# Patient Record
Sex: Female | Born: 1970 | Race: White | Hispanic: No | Smoking: Former smoker
Health system: Southern US, Community
[De-identification: ages and names within clinical notes are randomized; demographics above are authoritative.]

## PROBLEM LIST (undated history)

## (undated) ENCOUNTER — Inpatient Hospital Stay (HOSPITAL_COMMUNITY): Payer: Self-pay

## (undated) DIAGNOSIS — I471 Supraventricular tachycardia, unspecified: Secondary | ICD-10-CM

## (undated) DIAGNOSIS — F419 Anxiety disorder, unspecified: Secondary | ICD-10-CM

## (undated) DIAGNOSIS — F329 Major depressive disorder, single episode, unspecified: Secondary | ICD-10-CM

## (undated) DIAGNOSIS — R87619 Unspecified abnormal cytological findings in specimens from cervix uteri: Secondary | ICD-10-CM

## (undated) DIAGNOSIS — T8859XA Other complications of anesthesia, initial encounter: Secondary | ICD-10-CM

## (undated) DIAGNOSIS — O139 Gestational [pregnancy-induced] hypertension without significant proteinuria, unspecified trimester: Secondary | ICD-10-CM

## (undated) DIAGNOSIS — F988 Other specified behavioral and emotional disorders with onset usually occurring in childhood and adolescence: Secondary | ICD-10-CM

## (undated) DIAGNOSIS — F32A Depression, unspecified: Secondary | ICD-10-CM

## (undated) DIAGNOSIS — M1990 Unspecified osteoarthritis, unspecified site: Secondary | ICD-10-CM

## (undated) DIAGNOSIS — R51 Headache: Secondary | ICD-10-CM

## (undated) DIAGNOSIS — R112 Nausea with vomiting, unspecified: Secondary | ICD-10-CM

## (undated) DIAGNOSIS — K219 Gastro-esophageal reflux disease without esophagitis: Secondary | ICD-10-CM

## (undated) DIAGNOSIS — G709 Myoneural disorder, unspecified: Secondary | ICD-10-CM

## (undated) DIAGNOSIS — I492 Junctional premature depolarization: Secondary | ICD-10-CM

## (undated) DIAGNOSIS — M797 Fibromyalgia: Secondary | ICD-10-CM

## (undated) DIAGNOSIS — G4730 Sleep apnea, unspecified: Secondary | ICD-10-CM

## (undated) DIAGNOSIS — IMO0002 Reserved for concepts with insufficient information to code with codable children: Secondary | ICD-10-CM

## (undated) DIAGNOSIS — I4949 Other premature depolarization: Secondary | ICD-10-CM

## (undated) DIAGNOSIS — Z9889 Other specified postprocedural states: Secondary | ICD-10-CM

## (undated) HISTORY — PX: FOOT SURGERY: SHX648

## (undated) HISTORY — PX: CERVICAL FUSION: SHX112

## (undated) HISTORY — DX: Headache: R51

## (undated) HISTORY — PX: SHOULDER SURGERY: SHX246

## (undated) HISTORY — DX: Other specified behavioral and emotional disorders with onset usually occurring in childhood and adolescence: F98.8

---
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
## 1998-01-21 ENCOUNTER — Ambulatory Visit (HOSPITAL_COMMUNITY): Admission: RE | Admit: 1998-01-21 | Discharge: 1998-01-21 | Payer: Self-pay | Admitting: Family Medicine

## 1998-03-01 ENCOUNTER — Emergency Department (HOSPITAL_COMMUNITY): Admission: EM | Admit: 1998-03-01 | Discharge: 1998-03-01 | Payer: Self-pay | Admitting: Emergency Medicine

## 1998-05-05 ENCOUNTER — Emergency Department (HOSPITAL_COMMUNITY): Admission: EM | Admit: 1998-05-05 | Discharge: 1998-05-05 | Payer: Self-pay | Admitting: Emergency Medicine

## 1998-05-17 ENCOUNTER — Emergency Department (HOSPITAL_COMMUNITY): Admission: EM | Admit: 1998-05-17 | Discharge: 1998-05-17 | Payer: Self-pay | Admitting: Emergency Medicine

## 1998-05-28 ENCOUNTER — Emergency Department (HOSPITAL_COMMUNITY): Admission: EM | Admit: 1998-05-28 | Discharge: 1998-05-28 | Payer: Self-pay | Admitting: Emergency Medicine

## 1998-07-24 HISTORY — PX: CARPAL TUNNEL RELEASE: SHX101

## 1998-07-26 ENCOUNTER — Emergency Department (HOSPITAL_COMMUNITY): Admission: EM | Admit: 1998-07-26 | Discharge: 1998-07-26 | Payer: Self-pay | Admitting: Internal Medicine

## 1998-10-01 ENCOUNTER — Encounter: Payer: Self-pay | Admitting: Neurosurgery

## 1998-10-01 ENCOUNTER — Ambulatory Visit (HOSPITAL_COMMUNITY): Admission: RE | Admit: 1998-10-01 | Discharge: 1998-10-01 | Payer: Self-pay | Admitting: Neurosurgery

## 1998-10-11 ENCOUNTER — Ambulatory Visit (HOSPITAL_COMMUNITY): Admission: RE | Admit: 1998-10-11 | Discharge: 1998-10-11 | Payer: Self-pay | Admitting: Neurosurgery

## 1998-11-04 ENCOUNTER — Ambulatory Visit (HOSPITAL_COMMUNITY): Admission: RE | Admit: 1998-11-04 | Discharge: 1998-11-04 | Payer: Self-pay | Admitting: Neurosurgery

## 1999-05-17 ENCOUNTER — Encounter: Admission: RE | Admit: 1999-05-17 | Discharge: 1999-07-11 | Payer: Self-pay | Admitting: Family Medicine

## 1999-08-22 ENCOUNTER — Encounter: Payer: Self-pay | Admitting: Neurosurgery

## 1999-08-24 ENCOUNTER — Inpatient Hospital Stay (HOSPITAL_COMMUNITY): Admission: RE | Admit: 1999-08-24 | Discharge: 1999-08-25 | Payer: Self-pay | Admitting: Neurosurgery

## 1999-08-24 ENCOUNTER — Encounter: Payer: Self-pay | Admitting: Neurosurgery

## 1999-08-26 ENCOUNTER — Emergency Department (HOSPITAL_COMMUNITY): Admission: EM | Admit: 1999-08-26 | Discharge: 1999-08-26 | Payer: Self-pay | Admitting: Emergency Medicine

## 1999-09-16 ENCOUNTER — Encounter: Admission: RE | Admit: 1999-09-16 | Discharge: 1999-09-16 | Payer: Self-pay | Admitting: Neurosurgery

## 1999-09-16 ENCOUNTER — Encounter: Payer: Self-pay | Admitting: Neurosurgery

## 1999-11-04 ENCOUNTER — Emergency Department (HOSPITAL_COMMUNITY): Admission: EM | Admit: 1999-11-04 | Discharge: 1999-11-04 | Payer: Self-pay | Admitting: Emergency Medicine

## 1999-11-30 ENCOUNTER — Other Ambulatory Visit: Admission: RE | Admit: 1999-11-30 | Discharge: 1999-11-30 | Payer: Self-pay | Admitting: Family Medicine

## 1999-12-27 ENCOUNTER — Encounter: Admission: RE | Admit: 1999-12-27 | Discharge: 1999-12-27 | Payer: Self-pay | Admitting: Neurosurgery

## 1999-12-27 ENCOUNTER — Encounter: Payer: Self-pay | Admitting: Neurosurgery

## 2000-02-20 ENCOUNTER — Encounter: Payer: Self-pay | Admitting: Neurosurgery

## 2000-02-20 ENCOUNTER — Encounter: Admission: RE | Admit: 2000-02-20 | Discharge: 2000-02-20 | Payer: Self-pay | Admitting: Neurosurgery

## 2000-04-17 ENCOUNTER — Encounter: Payer: Self-pay | Admitting: Neurosurgery

## 2000-04-17 ENCOUNTER — Ambulatory Visit (HOSPITAL_COMMUNITY): Admission: RE | Admit: 2000-04-17 | Discharge: 2000-04-17 | Payer: Self-pay | Admitting: Neurosurgery

## 2000-04-25 ENCOUNTER — Encounter: Payer: Self-pay | Admitting: Neurosurgery

## 2000-04-26 ENCOUNTER — Encounter: Payer: Self-pay | Admitting: Neurosurgery

## 2000-04-26 ENCOUNTER — Ambulatory Visit (HOSPITAL_COMMUNITY): Admission: RE | Admit: 2000-04-26 | Discharge: 2000-04-27 | Payer: Self-pay | Admitting: Neurosurgery

## 2000-04-27 ENCOUNTER — Encounter: Payer: Self-pay | Admitting: Neurosurgery

## 2000-05-21 ENCOUNTER — Encounter: Payer: Self-pay | Admitting: Neurosurgery

## 2000-05-21 ENCOUNTER — Ambulatory Visit (HOSPITAL_COMMUNITY): Admission: RE | Admit: 2000-05-21 | Discharge: 2000-05-21 | Payer: Self-pay | Admitting: Neurosurgery

## 2000-07-24 HISTORY — PX: ABLATION OF DYSRHYTHMIC FOCUS: SHX254

## 2000-09-07 ENCOUNTER — Encounter: Payer: Self-pay | Admitting: Neurosurgery

## 2000-09-07 ENCOUNTER — Ambulatory Visit (HOSPITAL_COMMUNITY): Admission: RE | Admit: 2000-09-07 | Discharge: 2000-09-07 | Payer: Self-pay | Admitting: Neurosurgery

## 2000-09-28 ENCOUNTER — Inpatient Hospital Stay (HOSPITAL_COMMUNITY): Admission: EM | Admit: 2000-09-28 | Discharge: 2000-09-29 | Payer: Self-pay | Admitting: Emergency Medicine

## 2000-09-28 ENCOUNTER — Encounter: Payer: Self-pay | Admitting: Emergency Medicine

## 2000-09-29 ENCOUNTER — Emergency Department (HOSPITAL_COMMUNITY): Admission: EM | Admit: 2000-09-29 | Discharge: 2000-09-29 | Payer: Self-pay | Admitting: Emergency Medicine

## 2000-11-08 ENCOUNTER — Ambulatory Visit (HOSPITAL_COMMUNITY): Admission: RE | Admit: 2000-11-08 | Discharge: 2000-11-09 | Payer: Self-pay | Admitting: Internal Medicine

## 2000-12-11 ENCOUNTER — Emergency Department (HOSPITAL_COMMUNITY): Admission: EM | Admit: 2000-12-11 | Discharge: 2000-12-11 | Payer: Self-pay | Admitting: Emergency Medicine

## 2001-03-14 ENCOUNTER — Ambulatory Visit (HOSPITAL_COMMUNITY): Admission: RE | Admit: 2001-03-14 | Discharge: 2001-03-14 | Payer: Self-pay | Admitting: Gastroenterology

## 2001-03-14 ENCOUNTER — Encounter (INDEPENDENT_AMBULATORY_CARE_PROVIDER_SITE_OTHER): Payer: Self-pay | Admitting: Specialist

## 2001-04-10 ENCOUNTER — Encounter: Payer: Self-pay | Admitting: Orthopaedic Surgery

## 2001-04-10 ENCOUNTER — Ambulatory Visit (HOSPITAL_COMMUNITY): Admission: RE | Admit: 2001-04-10 | Discharge: 2001-04-10 | Payer: Self-pay | Admitting: Orthopaedic Surgery

## 2001-05-09 ENCOUNTER — Inpatient Hospital Stay (HOSPITAL_COMMUNITY): Admission: RE | Admit: 2001-05-09 | Discharge: 2001-05-13 | Payer: Self-pay | Admitting: Orthopaedic Surgery

## 2001-05-09 ENCOUNTER — Encounter: Payer: Self-pay | Admitting: Orthopaedic Surgery

## 2001-05-11 ENCOUNTER — Encounter: Payer: Self-pay | Admitting: Orthopaedic Surgery

## 2001-06-13 ENCOUNTER — Ambulatory Visit (HOSPITAL_COMMUNITY): Admission: RE | Admit: 2001-06-13 | Discharge: 2001-06-13 | Payer: Self-pay | Admitting: *Deleted

## 2001-06-14 ENCOUNTER — Other Ambulatory Visit: Admission: RE | Admit: 2001-06-14 | Discharge: 2001-06-14 | Payer: Self-pay | Admitting: Family Medicine

## 2001-08-01 ENCOUNTER — Encounter: Payer: Self-pay | Admitting: Orthopaedic Surgery

## 2001-08-01 ENCOUNTER — Encounter: Admission: RE | Admit: 2001-08-01 | Discharge: 2001-08-01 | Payer: Self-pay | Admitting: Orthopaedic Surgery

## 2002-01-01 ENCOUNTER — Other Ambulatory Visit: Admission: RE | Admit: 2002-01-01 | Discharge: 2002-01-01 | Payer: Self-pay | Admitting: Obstetrics and Gynecology

## 2002-03-02 ENCOUNTER — Encounter: Payer: Self-pay | Admitting: Emergency Medicine

## 2002-03-02 ENCOUNTER — Emergency Department (HOSPITAL_COMMUNITY): Admission: EM | Admit: 2002-03-02 | Discharge: 2002-03-02 | Payer: Self-pay | Admitting: Emergency Medicine

## 2002-04-22 ENCOUNTER — Encounter: Admission: RE | Admit: 2002-04-22 | Discharge: 2002-04-22 | Payer: Self-pay | Admitting: Anesthesiology

## 2002-05-21 ENCOUNTER — Encounter: Admission: RE | Admit: 2002-05-21 | Discharge: 2002-08-19 | Payer: Self-pay

## 2003-03-09 ENCOUNTER — Other Ambulatory Visit: Admission: RE | Admit: 2003-03-09 | Discharge: 2003-03-09 | Payer: Self-pay | Admitting: Family Medicine

## 2004-02-24 ENCOUNTER — Encounter
Admission: RE | Admit: 2004-02-24 | Discharge: 2004-02-24 | Payer: Self-pay | Admitting: Physical Medicine and Rehabilitation

## 2004-03-07 ENCOUNTER — Emergency Department (HOSPITAL_COMMUNITY): Admission: EM | Admit: 2004-03-07 | Discharge: 2004-03-07 | Payer: Self-pay | Admitting: Emergency Medicine

## 2004-03-22 ENCOUNTER — Other Ambulatory Visit: Admission: RE | Admit: 2004-03-22 | Discharge: 2004-03-22 | Payer: Self-pay | Admitting: Family Medicine

## 2004-06-14 ENCOUNTER — Ambulatory Visit: Payer: Self-pay | Admitting: Family Medicine

## 2004-08-29 ENCOUNTER — Ambulatory Visit: Payer: Self-pay | Admitting: Family Medicine

## 2004-12-06 ENCOUNTER — Ambulatory Visit: Payer: Self-pay | Admitting: Family Medicine

## 2005-02-20 ENCOUNTER — Ambulatory Visit: Payer: Self-pay | Admitting: Family Medicine

## 2005-02-23 ENCOUNTER — Ambulatory Visit: Payer: Self-pay | Admitting: Family Medicine

## 2005-06-01 ENCOUNTER — Ambulatory Visit: Payer: Self-pay | Admitting: Family Medicine

## 2005-09-20 ENCOUNTER — Ambulatory Visit: Payer: Self-pay | Admitting: Family Medicine

## 2005-10-02 ENCOUNTER — Ambulatory Visit: Payer: Self-pay | Admitting: Family Medicine

## 2005-12-05 ENCOUNTER — Ambulatory Visit: Payer: Self-pay | Admitting: Family Medicine

## 2006-03-01 ENCOUNTER — Ambulatory Visit: Payer: Self-pay | Admitting: Family Medicine

## 2006-03-06 ENCOUNTER — Ambulatory Visit: Payer: Self-pay | Admitting: Family Medicine

## 2006-08-30 ENCOUNTER — Ambulatory Visit: Payer: Self-pay | Admitting: Family Medicine

## 2007-01-31 ENCOUNTER — Ambulatory Visit: Payer: Self-pay | Admitting: Family Medicine

## 2007-01-31 DIAGNOSIS — F988 Other specified behavioral and emotional disorders with onset usually occurring in childhood and adolescence: Secondary | ICD-10-CM | POA: Insufficient documentation

## 2007-01-31 DIAGNOSIS — Z87891 Personal history of nicotine dependence: Secondary | ICD-10-CM | POA: Insufficient documentation

## 2007-01-31 DIAGNOSIS — R51 Headache: Secondary | ICD-10-CM

## 2007-01-31 DIAGNOSIS — R519 Headache, unspecified: Secondary | ICD-10-CM | POA: Insufficient documentation

## 2007-03-05 ENCOUNTER — Encounter: Payer: Self-pay | Admitting: Family Medicine

## 2007-03-05 ENCOUNTER — Other Ambulatory Visit: Admission: RE | Admit: 2007-03-05 | Discharge: 2007-03-05 | Payer: Self-pay | Admitting: Family Medicine

## 2007-03-05 ENCOUNTER — Ambulatory Visit: Payer: Self-pay | Admitting: Family Medicine

## 2007-03-05 DIAGNOSIS — Z9889 Other specified postprocedural states: Secondary | ICD-10-CM | POA: Insufficient documentation

## 2007-03-05 DIAGNOSIS — M797 Fibromyalgia: Secondary | ICD-10-CM | POA: Insufficient documentation

## 2007-03-06 LAB — CONVERTED CEMR LAB
ALT: 21 units/L (ref 0–35)
BUN: 8 mg/dL (ref 6–23)
Basophils Absolute: 0.1 10*3/uL (ref 0.0–0.1)
Basophils Relative: 0.7 % (ref 0.0–1.0)
Calcium: 8.7 mg/dL (ref 8.4–10.5)
Cholesterol: 158 mg/dL (ref 0–200)
Creatinine, Ser: 0.7 mg/dL (ref 0.4–1.2)
Eosinophils Absolute: 0.1 10*3/uL (ref 0.0–0.6)
GFR calc Af Amer: 122 mL/min
GFR calc non Af Amer: 101 mL/min
Glucose, Bld: 91 mg/dL (ref 70–99)
Monocytes Relative: 7.6 % (ref 3.0–11.0)
Neutro Abs: 4.6 10*3/uL (ref 1.4–7.7)
Potassium: 3.8 meq/L (ref 3.5–5.1)
RBC: 4.52 M/uL (ref 3.87–5.11)
RDW: 12.3 % (ref 11.5–14.6)
TSH: 1.29 microintl units/mL (ref 0.35–5.50)
Total Bilirubin: 0.7 mg/dL (ref 0.3–1.2)
Total Protein: 6.1 g/dL (ref 6.0–8.3)
Triglycerides: 52 mg/dL (ref 0–149)
VLDL: 10 mg/dL (ref 0–40)

## 2007-03-11 ENCOUNTER — Telehealth (INDEPENDENT_AMBULATORY_CARE_PROVIDER_SITE_OTHER): Payer: Self-pay | Admitting: *Deleted

## 2007-03-20 ENCOUNTER — Encounter (INDEPENDENT_AMBULATORY_CARE_PROVIDER_SITE_OTHER): Payer: Self-pay | Admitting: *Deleted

## 2007-03-28 ENCOUNTER — Emergency Department (HOSPITAL_COMMUNITY): Admission: EM | Admit: 2007-03-28 | Discharge: 2007-03-28 | Payer: Self-pay | Admitting: Emergency Medicine

## 2007-06-11 ENCOUNTER — Ambulatory Visit: Payer: Self-pay | Admitting: Family Medicine

## 2007-10-25 ENCOUNTER — Ambulatory Visit: Payer: Self-pay | Admitting: Family Medicine

## 2007-11-11 ENCOUNTER — Telehealth (INDEPENDENT_AMBULATORY_CARE_PROVIDER_SITE_OTHER): Payer: Self-pay | Admitting: *Deleted

## 2007-11-22 ENCOUNTER — Telehealth (INDEPENDENT_AMBULATORY_CARE_PROVIDER_SITE_OTHER): Payer: Self-pay | Admitting: *Deleted

## 2007-12-13 ENCOUNTER — Encounter: Payer: Self-pay | Admitting: Internal Medicine

## 2007-12-20 ENCOUNTER — Telehealth (INDEPENDENT_AMBULATORY_CARE_PROVIDER_SITE_OTHER): Payer: Self-pay | Admitting: *Deleted

## 2008-01-03 ENCOUNTER — Telehealth (INDEPENDENT_AMBULATORY_CARE_PROVIDER_SITE_OTHER): Payer: Self-pay | Admitting: *Deleted

## 2008-02-03 ENCOUNTER — Telehealth (INDEPENDENT_AMBULATORY_CARE_PROVIDER_SITE_OTHER): Payer: Self-pay | Admitting: *Deleted

## 2008-02-19 ENCOUNTER — Ambulatory Visit: Payer: Self-pay | Admitting: Family Medicine

## 2008-04-10 ENCOUNTER — Other Ambulatory Visit: Admission: RE | Admit: 2008-04-10 | Discharge: 2008-04-10 | Payer: Self-pay | Admitting: Family Medicine

## 2008-04-10 ENCOUNTER — Encounter: Payer: Self-pay | Admitting: Family Medicine

## 2008-04-10 ENCOUNTER — Ambulatory Visit: Payer: Self-pay | Admitting: Family Medicine

## 2008-04-21 ENCOUNTER — Encounter (INDEPENDENT_AMBULATORY_CARE_PROVIDER_SITE_OTHER): Payer: Self-pay | Admitting: *Deleted

## 2008-04-21 LAB — CONVERTED CEMR LAB
Albumin: 4.4 g/dL (ref 3.5–5.2)
BUN: 8 mg/dL (ref 6–23)
Basophils Relative: 1 % (ref 0–1)
CO2: 23 meq/L (ref 19–32)
Chloride: 104 meq/L (ref 96–112)
Creatinine, Ser: 0.69 mg/dL (ref 0.40–1.20)
HCT: 42.9 % (ref 36.0–46.0)
HDL: 82 mg/dL (ref >39)
Hemoglobin: 14.5 g/dL (ref 12.0–15.0)
Lymphs Abs: 2.2 10*3/uL (ref 0.7–4.0)
MCHC: 33.8 g/dL (ref 30.0–36.0)
MCV: 91.3 fL (ref 78.0–100.0)
Neutrophils Relative %: 61 % (ref 43–77)
RDW: 13 % (ref 11.5–15.5)
TSH: 1.675 microintl units/mL (ref 0.350–4.50)
Total Protein: 7 g/dL (ref 6.0–8.3)
Triglycerides: 56 mg/dL (ref <150)
VLDL: 11 mg/dL (ref 0–40)
WBC: 8 10*3/uL (ref 4.0–10.5)

## 2008-04-22 ENCOUNTER — Encounter (INDEPENDENT_AMBULATORY_CARE_PROVIDER_SITE_OTHER): Payer: Self-pay | Admitting: *Deleted

## 2008-06-03 ENCOUNTER — Telehealth (INDEPENDENT_AMBULATORY_CARE_PROVIDER_SITE_OTHER): Payer: Self-pay | Admitting: *Deleted

## 2008-07-30 ENCOUNTER — Telehealth (INDEPENDENT_AMBULATORY_CARE_PROVIDER_SITE_OTHER): Payer: Self-pay | Admitting: *Deleted

## 2008-08-26 ENCOUNTER — Ambulatory Visit: Payer: Self-pay | Admitting: Family Medicine

## 2008-08-26 DIAGNOSIS — T7840XA Allergy, unspecified, initial encounter: Secondary | ICD-10-CM | POA: Insufficient documentation

## 2008-08-27 ENCOUNTER — Telehealth: Payer: Self-pay | Admitting: Family Medicine

## 2008-10-20 ENCOUNTER — Telehealth (INDEPENDENT_AMBULATORY_CARE_PROVIDER_SITE_OTHER): Payer: Self-pay | Admitting: *Deleted

## 2009-01-01 ENCOUNTER — Telehealth (INDEPENDENT_AMBULATORY_CARE_PROVIDER_SITE_OTHER): Payer: Self-pay | Admitting: *Deleted

## 2009-01-07 ENCOUNTER — Telehealth (INDEPENDENT_AMBULATORY_CARE_PROVIDER_SITE_OTHER): Payer: Self-pay | Admitting: *Deleted

## 2009-01-07 ENCOUNTER — Encounter: Payer: Self-pay | Admitting: Family Medicine

## 2009-02-15 ENCOUNTER — Telehealth (INDEPENDENT_AMBULATORY_CARE_PROVIDER_SITE_OTHER): Payer: Self-pay | Admitting: *Deleted

## 2009-03-26 ENCOUNTER — Ambulatory Visit: Payer: Self-pay | Admitting: Family Medicine

## 2009-03-26 DIAGNOSIS — F329 Major depressive disorder, single episode, unspecified: Secondary | ICD-10-CM

## 2009-03-26 DIAGNOSIS — F411 Generalized anxiety disorder: Secondary | ICD-10-CM | POA: Insufficient documentation

## 2009-03-26 DIAGNOSIS — F341 Dysthymic disorder: Secondary | ICD-10-CM

## 2009-03-26 DIAGNOSIS — F3342 Major depressive disorder, recurrent, in full remission: Secondary | ICD-10-CM | POA: Insufficient documentation

## 2009-04-26 ENCOUNTER — Ambulatory Visit: Payer: Self-pay | Admitting: Family Medicine

## 2009-04-26 DIAGNOSIS — N912 Amenorrhea, unspecified: Secondary | ICD-10-CM | POA: Insufficient documentation

## 2009-04-26 LAB — CONVERTED CEMR LAB: Beta hcg, urine, semiquantitative: POSITIVE

## 2009-11-18 ENCOUNTER — Inpatient Hospital Stay (HOSPITAL_COMMUNITY): Admission: AD | Admit: 2009-11-18 | Discharge: 2009-11-18 | Payer: Self-pay | Admitting: Obstetrics and Gynecology

## 2009-11-25 ENCOUNTER — Inpatient Hospital Stay (HOSPITAL_COMMUNITY): Admission: AD | Admit: 2009-11-25 | Discharge: 2009-11-25 | Payer: Self-pay | Admitting: Obstetrics and Gynecology

## 2009-11-29 ENCOUNTER — Inpatient Hospital Stay (HOSPITAL_COMMUNITY): Admission: AD | Admit: 2009-11-29 | Discharge: 2009-11-29 | Payer: Self-pay | Admitting: Obstetrics and Gynecology

## 2009-12-07 ENCOUNTER — Ambulatory Visit: Payer: Self-pay | Admitting: Obstetrics and Gynecology

## 2009-12-07 ENCOUNTER — Inpatient Hospital Stay (HOSPITAL_COMMUNITY): Admission: AD | Admit: 2009-12-07 | Discharge: 2009-12-09 | Payer: Self-pay | Admitting: Obstetrics and Gynecology

## 2009-12-09 ENCOUNTER — Encounter: Admission: RE | Admit: 2009-12-09 | Discharge: 2010-01-08 | Payer: Self-pay | Admitting: Obstetrics and Gynecology

## 2009-12-13 ENCOUNTER — Ambulatory Visit: Admission: RE | Admit: 2009-12-13 | Discharge: 2009-12-13 | Payer: Self-pay | Admitting: Obstetrics and Gynecology

## 2010-01-09 ENCOUNTER — Encounter: Admission: RE | Admit: 2010-01-09 | Discharge: 2010-01-20 | Payer: Self-pay | Admitting: Obstetrics and Gynecology

## 2010-01-12 ENCOUNTER — Ambulatory Visit: Payer: Self-pay | Admitting: Diagnostic Radiology

## 2010-01-12 ENCOUNTER — Emergency Department (HOSPITAL_BASED_OUTPATIENT_CLINIC_OR_DEPARTMENT_OTHER): Admission: EM | Admit: 2010-01-12 | Discharge: 2010-01-12 | Payer: Self-pay | Admitting: Emergency Medicine

## 2010-02-10 ENCOUNTER — Encounter (INDEPENDENT_AMBULATORY_CARE_PROVIDER_SITE_OTHER): Payer: Self-pay | Admitting: *Deleted

## 2010-02-10 ENCOUNTER — Ambulatory Visit: Payer: Self-pay | Admitting: Family Medicine

## 2010-02-10 ENCOUNTER — Telehealth: Payer: Self-pay | Admitting: Family Medicine

## 2010-02-10 DIAGNOSIS — G43009 Migraine without aura, not intractable, without status migrainosus: Secondary | ICD-10-CM | POA: Insufficient documentation

## 2010-02-10 DIAGNOSIS — R030 Elevated blood-pressure reading, without diagnosis of hypertension: Secondary | ICD-10-CM | POA: Insufficient documentation

## 2010-02-14 LAB — CONVERTED CEMR LAB
ALT: 20 units/L (ref 0–35)
Albumin: 3.9 g/dL (ref 3.5–5.2)
Basophils Relative: 0.7 % (ref 0.0–3.0)
CO2: 30 meq/L (ref 19–32)
Chloride: 109 meq/L (ref 96–112)
Creatinine, Ser: 0.8 mg/dL (ref 0.4–1.2)
Eosinophils Relative: 1.9 % (ref 0.0–5.0)
GFR calc non Af Amer: 91.89 mL/min (ref >60)
Monocytes Absolute: 0.5 10*3/uL (ref 0.1–1.0)
Monocytes Relative: 9.1 % (ref 3.0–12.0)
Neutro Abs: 2.9 10*3/uL (ref 1.4–7.7)
Neutrophils Relative %: 54.5 % (ref 43.0–77.0)
Potassium: 4.4 meq/L (ref 3.5–5.1)
RBC: 4.4 M/uL (ref 3.87–5.11)
Sed Rate: 31 mm/hr — ABNORMAL HIGH (ref 0–22)
Sodium: 145 meq/L (ref 135–145)
Total Protein: 7.4 g/dL (ref 6.0–8.3)
WBC: 5.3 10*3/uL (ref 4.5–10.5)

## 2010-02-24 ENCOUNTER — Encounter (INDEPENDENT_AMBULATORY_CARE_PROVIDER_SITE_OTHER): Payer: Self-pay | Admitting: *Deleted

## 2010-02-24 ENCOUNTER — Telehealth: Payer: Self-pay | Admitting: Family Medicine

## 2010-02-28 ENCOUNTER — Ambulatory Visit: Payer: Self-pay | Admitting: Family Medicine

## 2010-03-01 LAB — CONVERTED CEMR LAB: Sed Rate: 18 mm/hr (ref 0–22)

## 2010-03-02 ENCOUNTER — Telehealth: Payer: Self-pay | Admitting: Internal Medicine

## 2010-03-02 ENCOUNTER — Ambulatory Visit: Payer: Self-pay | Admitting: Family Medicine

## 2010-03-11 ENCOUNTER — Telehealth: Payer: Self-pay | Admitting: Family Medicine

## 2010-03-16 ENCOUNTER — Encounter: Payer: Self-pay | Admitting: Family Medicine

## 2010-04-19 ENCOUNTER — Telehealth: Payer: Self-pay | Admitting: Family Medicine

## 2010-04-21 ENCOUNTER — Ambulatory Visit: Payer: Self-pay | Admitting: Family Medicine

## 2010-05-12 ENCOUNTER — Ambulatory Visit: Payer: Self-pay | Admitting: Family Medicine

## 2010-05-12 DIAGNOSIS — M546 Pain in thoracic spine: Secondary | ICD-10-CM | POA: Insufficient documentation

## 2010-06-29 ENCOUNTER — Ambulatory Visit: Payer: Self-pay | Admitting: Family Medicine

## 2010-06-29 DIAGNOSIS — K219 Gastro-esophageal reflux disease without esophagitis: Secondary | ICD-10-CM | POA: Insufficient documentation

## 2010-06-29 DIAGNOSIS — J019 Acute sinusitis, unspecified: Secondary | ICD-10-CM | POA: Insufficient documentation

## 2010-07-22 ENCOUNTER — Ambulatory Visit
Admission: RE | Admit: 2010-07-22 | Discharge: 2010-07-22 | Payer: Self-pay | Source: Home / Self Care | Attending: Family Medicine | Admitting: Family Medicine

## 2010-08-11 ENCOUNTER — Ambulatory Visit
Admission: RE | Admit: 2010-08-11 | Discharge: 2010-08-11 | Payer: Self-pay | Source: Home / Self Care | Attending: Family Medicine | Admitting: Family Medicine

## 2010-08-11 ENCOUNTER — Other Ambulatory Visit: Payer: Self-pay | Admitting: Family Medicine

## 2010-08-11 DIAGNOSIS — R5383 Other fatigue: Secondary | ICD-10-CM | POA: Insufficient documentation

## 2010-08-11 DIAGNOSIS — L659 Nonscarring hair loss, unspecified: Secondary | ICD-10-CM | POA: Insufficient documentation

## 2010-08-11 DIAGNOSIS — R5381 Other malaise: Secondary | ICD-10-CM | POA: Insufficient documentation

## 2010-08-12 LAB — CBC WITH DIFFERENTIAL/PLATELET
Basophils Absolute: 0 10*3/uL (ref 0.0–0.1)
Basophils Relative: 0.3 % (ref 0.0–3.0)
Eosinophils Absolute: 0.1 10*3/uL (ref 0.0–0.7)
Eosinophils Relative: 1.9 % (ref 0.0–5.0)
HCT: 41.7 % (ref 36.0–46.0)
Hemoglobin: 14.5 g/dL (ref 12.0–15.0)
Lymphocytes Relative: 33.1 % (ref 12.0–46.0)
Lymphs Abs: 1.7 10*3/uL (ref 0.7–4.0)
MCHC: 34.7 g/dL (ref 30.0–36.0)
MCV: 88.4 fl (ref 78.0–100.0)
Monocytes Absolute: 0.5 10*3/uL (ref 0.1–1.0)
Monocytes Relative: 9 % (ref 3.0–12.0)
Neutro Abs: 2.9 10*3/uL (ref 1.4–7.7)
Neutrophils Relative %: 55.7 % (ref 43.0–77.0)
Platelets: 264 10*3/uL (ref 150.0–400.0)
RBC: 4.72 Mil/uL (ref 3.87–5.11)
RDW: 12.9 % (ref 11.5–14.6)
WBC: 5.2 10*3/uL (ref 4.5–10.5)

## 2010-08-12 LAB — BASIC METABOLIC PANEL
BUN: 10 mg/dL (ref 6–23)
CO2: 30 mEq/L (ref 19–32)
Calcium: 9.6 mg/dL (ref 8.4–10.5)
Chloride: 104 mEq/L (ref 96–112)
Creatinine, Ser: 0.7 mg/dL (ref 0.4–1.2)
GFR: 94.55 mL/min (ref >60.00)
Glucose, Bld: 93 mg/dL (ref 70–99)
Potassium: 4.5 mEq/L (ref 3.5–5.1)
Sodium: 140 mEq/L (ref 135–145)

## 2010-08-12 LAB — B12 AND FOLATE PANEL
Folate: 18 ng/mL (ref >5.9)
Vitamin B-12: 766 pg/mL (ref 211–911)

## 2010-08-12 LAB — HEPATIC FUNCTION PANEL
ALT: 19 U/L (ref 0–35)
AST: 19 U/L (ref 0–37)
Albumin: 4 g/dL (ref 3.5–5.2)
Alkaline Phosphatase: 66 U/L (ref 39–117)
Bilirubin, Direct: 0.1 mg/dL (ref 0.0–0.3)
Total Bilirubin: 0.7 mg/dL (ref 0.3–1.2)
Total Protein: 6.9 g/dL (ref 6.0–8.3)

## 2010-08-12 LAB — TSH: TSH: 1.25 u[IU]/mL (ref 0.35–5.50)

## 2010-08-15 ENCOUNTER — Encounter: Payer: Self-pay | Admitting: Family Medicine

## 2010-08-15 ENCOUNTER — Telehealth (INDEPENDENT_AMBULATORY_CARE_PROVIDER_SITE_OTHER): Payer: Self-pay | Admitting: *Deleted

## 2010-08-23 NOTE — Assessment & Plan Note (Signed)
Summary: migraine/cbs   Vital Signs:  Patient profile:   40 year old female Height:      67.75 inches Weight:      266 pounds BMI:     40.89 Temp:     98.2 degrees F oral Pulse rate:   73 / minute BP sitting:   130 / 86  (left arm)  Vitals Entered By: Jeremy Johann CMA (February 10, 2010 8:11 AM) CC: migraine, Headaches, Headache   History of Present Illness:  Headaches      This is a 40 year old woman who presents with Headaches.  The symptoms began 3 months ago.  Pt started with migraines at [redacted] week gestation.  The patient complains of photophobia, but denies nausea, vomiting, sweats, tearing of eyes, nasal congestion, sinus pain, sinus pressure, and phonophobia.  The headache is described as intermittent and stabbing.  The location of the pain is bitemporal.  The patient denies the following high-risk features: fever, neck pain/stiffness, vision loss or change, focal weakness, altered mental status, rash, trauma, pain worse with exertion, new type of headache, age >50 years, immunosuppression, concomitant infection, and anticoagulation use.  Prior treatment has included a narcotic.  She has been getting headaches everyday.  OB gave her fiorcet.     Headache HPI (Reviewed):      The patient comes in for chronic management of headaches which have been unstable.  Since the last visit, the frequency of headaches have increased, and the intensity of the headaches have increased.  Duration of headaches: 3 months.  She has approximately 5+ headaches per month.  Headaches have been occurring since age 41.        On a scale of 1-10, she describes the headache as an 8.  The headaches are associated with an aura.  The location of the headaches are unilateral-right and bilateral.  Headache quality is sharp (knife-like).  Aggravating factors include walking up/down stairs, physical activity, head movement, bending down, and coughing or sneezing.  Precipitating factors consist of changes in weather and  perimenstrual.  The headaches are associated with photophobia.        Positive alarm features include change in frequency from prior H/A's.  The patient denies first or worst H/A of life, change in features from prior H/A's, new onset H/A's in middle-age or later, new or progressive H/A lasting days, H/A's with Valsalva (cough/sneeze), mylagia, fever, malaise, weight loss, scalp tenderness, jaw claudication, focal neurologic symptoms, confusion, seizures, and impaired level of consciousness.         Headache Treatment History (Reviewed):      She has tried ergots which were effective.  NSAID's, beta blockers, and SSRI's were tried but not effective.  Calcium channel blockers, anticonvulsants, and tricyclic antidepressants have not been tried.  The following preventive medications have been tried but failed: advil and Inderal.  She has tried acetaminophen-plain, ASA/butalbital/caffeine/codeine, ibuprofen, other NSAID, sumatriptan (Imitrex), naratriptan (Amerge), rizatriptan (Maxalt), and others which were effective.    Preventive Screening-Counseling & Management  Alcohol-Tobacco     Alcohol drinks/day: <1  Caffeine-Diet-Exercise     Caffeine use/day: 1     Does Patient Exercise: no     Exercise Counseling: to improve exercise regimen  Current Medications (verified): 1)  One-A-Day Womens Vitamins .... Take 1 Tab Once Daily  Allergies (verified): 1)  ! Toradol 2)  ! Morphine 3)  ! * Latex  Past History:  Past Surgical History: Last updated: 03/05/2007 Appendectomy (1986) Carpal tunnel release (2000) Cervical  fusion 08/24/99, 04/26/00, 05/09/01 l shoulder surgery 6/04 L wrist 11/30/03 r shoulder 09/12/04 tongue polyp `4696  Family History: Last updated: 03/26/2009 Family History Diabetes 1st degree relative Family History Thyroid disease Family History Hypertension F - hodkins lymphoma Family History Depression--  bipolar  Social History: Last updated: 10/25/2007 Occupation:p/t  [employer] Married Alcohol use-yes Drug use-no Regular exercise-no Former Smoker  Risk Factors: Alcohol Use: <1 (02/10/2010) Caffeine Use: 1 (02/10/2010) Exercise: no (02/10/2010)  Risk Factors: Smoking Status: quit (10/25/2007) Packs/Day: 1 (01/31/2007) Passive Smoke Exposure: yes (03/05/2007)  Past Medical History: Headache add G1p1  Family History: Reviewed history from 03/26/2009 and no changes required. Family History Diabetes 1st degree relative Family History Thyroid disease Family History Hypertension F - hodkins lymphoma Family History Depression--  bipolar  Social History: Reviewed history from 10/25/2007 and no changes required. Occupation:p/t [employer] Married Alcohol use-yes Drug use-no Regular exercise-no Former Smoker Caffeine use/day:  1  Review of Systems      See HPI  Physical Exam  General:  Well-developed,well-nourished,in no acute distress; alert,appropriate and cooperative throughout examination Eyes:  vision grossly intact, pupils equal, pupils round, pupils reactive to light, and no injection.   Ears:  External ear exam shows no significant lesions or deformities.  Otoscopic examination reveals clear canals, tympanic membranes are intact bilaterally without bulging, retraction, inflammation or discharge. Hearing is grossly normal bilaterally. Nose:  External nasal examination shows no deformity or inflammation. Nasal mucosa are pink and moist without lesions or exudates. Mouth:  Oral mucosa and oropharynx without lesions or exudates.  Teeth in good repair. Neck:  No deformities, masses, or tenderness noted. Lungs:  Normal respiratory effort, chest expands symmetrically. Lungs are clear to auscultation, no crackles or wheezes. Heart:  Normal rate and regular rhythm. S1 and S2 normal without gallop, murmur, click, rub or other extra sounds. Msk:  normal ROM.   Neurologic:  alert & oriented X3, cranial nerves II-XII intact, strength normal in all  extremities, and gait normal.   Skin:  Intact without suspicious lesions or rashes   Impression & Recommendations:  Problem # 1:  COMMON MIGRAINE (ICD-346.10) topamax 25 mg --- slowly increase to 50 mg two times a day  rto 3-4 weeks or sooner as needed  Orders: Venipuncture (29528) TLB-BMP (Basic Metabolic Panel-BMET) (80048-METABOL) TLB-CBC Platelet - w/Differential (85025-CBCD) TLB-Hepatic/Liver Function Pnl (80076-HEPATIC) TLB-TSH (Thyroid Stimulating Hormone) (84443-TSH) TLB-Sedimentation Rate (ESR) (85652-ESR) TLB-T4 (Thyrox), Free [phone]) TLB-T3, Free (Triiodothyronine) (84481-T3FREE)  Problem # 2:  ELEVATED BLOOD PRESSURE WITHOUT DIAGNOSIS OF HYPERTENSION (ICD-796.2)  Orders: Venipuncture (27253) TLB-BMP (Basic Metabolic Panel-BMET) (80048-METABOL) TLB-CBC Platelet - w/Differential (85025-CBCD) TLB-Hepatic/Liver Function Pnl (80076-HEPATIC) TLB-TSH (Thyroid Stimulating Hormone) (84443-TSH) TLB-Sedimentation Rate (ESR) (85652-ESR) TLB-T4 (Thyrox), Free (84439-FT4R) TLB-T3, Free (Triiodothyronine) (84481-T3FREE) Specimen Handling (66440)  BP today: 130/86 Prior BP: 110/78 (04/26/2009)  Labs Reviewed: Creat: 0.69 (04/10/2008) Chol: 203 (04/10/2008)   HDL: 82 (04/10/2008)   LDL: 110 (04/10/2008)   TG: 56 (04/10/2008)  Instructed in low sodium diet (DASH Handout) and behavior modification.    Complete Medication List: 1)  One-a-day Womens Vitamins  .... Take 1 tab once daily 2)  Topamax 25 Mg Tabs (Topiramate) .Marland Kitchen.. 1 by mouth at bedtime for 1 week then 1 by mouth two times a day for 1 week then 1 by mouth qam and 2 by mouth qpm 3)  Topamax 50 Mg Tabs (Topiramate) .Marland Kitchen.. 1 by mouth two times a day 4)  Bd Assure Bpm/manual Arm Cuff Misc (Blood pressure monitoring) .... As directed dx 401.9  Patient Instructions: 1)  Please schedule a follow-up appointment in 1 month.  Prescriptions: BD ASSURE BPM/MANUAL ARM CUFF  MISC (BLOOD PRESSURE MONITORING) as directed dx  401.9  #1 x 0   Entered and Authorized by:   Loreen Freud DO   Signed by:   Loreen Freud DO on 02/10/2010   Method used:   Print then Give to Patient   RxID:   [id number] TOPAMAX 50 MG TABS (TOPIRAMATE) 1 by mouth two times a day  #60 x 0   Entered and Authorized by:   Loreen Freud DO   Signed by:   Loreen Freud DO on 02/10/2010   Method used:   Electronically to        [facility] [phone]* (retail)       [address]       [place], [place]  [address]       Ph: [phone]       Fax: [phone]   RxID:   [id number] TOPAMAX 25 MG TABS (TOPIRAMATE) 1 by mouth at bedtime for 1 week then 1 by mouth two times a day for 1 week then 1 by mouth qam and 2 by mouth qpm  #42 x 0   Entered and Authorized by:   Loreen Freud DO   Signed by:   Loreen Freud DO on 02/10/2010   Method used:   Electronically to        [facility] #[id number]* (retail)       [address]       [place], [place]  [address]       Ph: [phone]       Fax: [phone]   RxID:   [id number]

## 2010-08-23 NOTE — Progress Notes (Signed)
Summary: work note  Phone Note Call from Patient Call back at [employer] [phone]   Summary of Call: Patient called stating that she is having a migraine. She has been taking her Topamax and took 3 advil a few hours ago and it has not helped. Patient needs a work note for [employer]. Please advise.  Patient already has appt for labs on Monday and office visit Wednesday of next week.  Initial call taken by: Lucious Groves CMA,  February 24, 2010 2:07 PM  Follow-up for Phone Call        ok for note for today----- refer to headache clinic Follow-up by: Loreen Freud DO,  February 24, 2010 4:09 PM  Additional Follow-up for Phone Call Additional follow up Details #1::        left message to call  office........Marland KitchenFelecia Deloach CMA  February 24, 2010 4:41 PM     Additional Follow-up for Phone Call Additional follow up Details #2::    Patient called back and states that she will come pick up the note. Patient also notes that she has already been to a headache clinic and does not wish to go back. She will discuss with MD at appt on Wed. Lucious Groves CMA  February 25, 2010 10:31 AM

## 2010-08-23 NOTE — Progress Notes (Signed)
Summary: Medication Concerns  Phone Note Call from Patient Call back at Home Phone [phone]   Caller: Patient Summary of Call: Adderall 20mg  two times a day #60, [facility] called 4 different pharmacies and no one has the 20mg  avaliable. Patient needs a new rx for 10mg  4 daily. Call when ready, patient will bring other rx back in   Chrae [place] CMA  March 02, 2010 2:10 PM   Follow-up for Phone Call        dr hopper pls advise ok to change to adderall 10mg  2 tab two times a day ..................Marland KitchenFelecia Deloach CMA  March 02, 2010 2:39 PM   Additional Follow-up for Phone Call Additional follow up Details #1::        10 mg 2 two times a day as needed  #60 Additional Follow-up by: Marga Melnick MD,  March 02, 2010 2:44 PM    New/Updated Medications: * ADDERALL  10 MG TABS (AMPHETAMINE-DEXTROAMPHETAMINE) 2 by mouth two times a day as needed Prescriptions: ADDERALL  10 MG TABS (AMPHETAMINE-DEXTROAMPHETAMINE) 2 by mouth two times a day as needed  #60 x 0   Entered and Authorized by:   Marga Melnick MD   Signed by:   Marga Melnick MD on 03/02/2010   Method used:   Print then Give to Patient   RxID:   [id number]   Appended Document: Medication Concerns left pt detail message rx ready for pick-up

## 2010-08-23 NOTE — Progress Notes (Signed)
Summary: New rx  Phone Note Call from Patient   Summary of Call: Patient called and LM on triage VM stating that her Adderall rx she picked up was written incorrectly. It is only enough pills for 2 weeks, but still states 1 month. She said insurance will not cover it and the pharmacy can not refill it in 2 weeks as well because of the way the rx stated 1 month. She would like a new rx. Please advise.  Initial call taken by: Harold Barban,  March 11, 2010 2:28 PM  Follow-up for Phone Call        done Follow-up by: Loreen Freud DO,  March 11, 2010 3:53 PM  Additional Follow-up for Phone Call Additional follow up Details #1::        LM informing her it is ready for pickup.  Additional Follow-up by: Harold Barban,  March 11, 2010 4:00 PM    Prescriptions: ADDERALL  10 MG TABS (AMPHETAMINE-DEXTROAMPHETAMINE) 2 by mouth two times a day as needed  #120 x 0   Entered and Authorized by:   Loreen Freud DO   Signed by:   Loreen Freud DO on 03/11/2010   Method used:   Print then Give to Patient   RxID:   [id number]

## 2010-08-23 NOTE — Assessment & Plan Note (Signed)
Summary: FLU SHOT//PH  Nurse Visit   Allergies: 1)  ! Toradol 2)  ! Morphine 3)  ! * Latex  Orders Added: 1)  Admin 1st Vaccine [90471] 2)  Flu Vaccine 52yrs + [[address]] Flu Vaccine Consent Questions     Do you have a history of severe allergic reactions to this vaccine? no    Any prior history of allergic reactions to egg and/or gelatin? no    Do you have a sensitivity to the preservative Thimersol? no    Do you have a past history of Guillan-Barre Syndrome? no    Do you currently have an acute febrile illness? no    Have you ever had a severe reaction to latex? no    Vaccine information given and explained to patient? yes    Are you currently pregnant? no    Lot Number:[id number]   Exp Date:01/21/2011   Site Given  Left Deltoid IM .lbflu

## 2010-08-23 NOTE — Assessment & Plan Note (Signed)
Summary: cough congested/cbs   Vital Signs:  Patient profile:   40 year old female Weight:      281 pounds BMI:     43.20 Temp:     98.7 degrees F oral Pulse rate:   84 / minute BP sitting:   122 / 84  (left arm)  Vitals Entered By: Doristine Devoid CMA (June 29, 2010 1:20 PM) CC: cough and chest congestion along w/ sinus congestion    History of Present Illness: 40 yo woman here today for cough and congestion.  sxs started 3 days ago w/ cough- 'like the lining of my lung's was coming out'.  started Claritin D and mucinex w/out relief.  now w/ facial pain/pressure, ears are full.  cough is mostly dry w/ some production in the AM.  no fevers.  no known sick contacts.  GERD- sxs not relieved w/ tagamet or pepcid.  would like to try omeprazole.  Current Medications (verified): 1)  One-A-Day Womens Vitamins .... Take 1 Tab Once Daily 2)  Bd Assure Bpm/manual Arm Cuff  Misc (Blood Pressure Monitoring) .... As Directed Dx 401.9 3)  Adderall 20 Mg Tabs (Amphetamine-Dextroamphetamine) .Marland Kitchen.. 1 By Mouth Two Times A Day  Allergies (verified): 1)  ! Toradol 2)  ! Morphine 3)  ! * Latex  Review of Systems      See HPI  Physical Exam  General:  Well-developed,well-nourished,in no acute distress; alert,appropriate and cooperative throughout examination Head:  NCAT, + TTP over maxillary sinuses Eyes:  no injxn or inflammation Ears:  TMs retracted bilaterally Nose:  + congestion Mouth:  + PND Neck:  No deformities, masses, or tenderness noted. Lungs:  Normal respiratory effort, chest expands symmetrically. Lungs are clear to auscultation, no crackles or wheezes. Heart:  normal rate and no murmur.     Impression & Recommendations:  Problem # 1:  SINUSITIS - ACUTE-NOS (ICD-461.9) Assessment New pt's sxs and PE consistent w/ infxn.  start Amox.  tessalon as needed for cough.  reviewed supportive care and red flags that should prompt return.  Pt expresses understanding and is in  agreement w/ this plan. Her updated medication list for this problem includes:    Amoxicillin 500 Mg Tabs (Amoxicillin) .Marland Kitchen... 2 tabs by mouth two times a day x10 days    Tessalon 200 Mg Caps (Benzonatate) .Marland Kitchen... Take one capsule by mouth three times a day as needed for cough  Problem # 2:  GERD (ICD-530.81) Assessment: New start PPI at pt's request. Her updated medication list for this problem includes:    Omeprazole 20 Mg Cpdr (Omeprazole) ..... One by mouth daily  Complete Medication List: 1)  One-a-day Womens Vitamins  .... Take 1 tab once daily 2)  Bd Assure Bpm/manual Arm Cuff Misc (Blood pressure monitoring) .... As directed dx 401.9 3)  Adderall 20 Mg Tabs (Amphetamine-dextroamphetamine) .Marland Kitchen.. 1 by mouth two times a day 4)  Amoxicillin 500 Mg Tabs (Amoxicillin) .... 2 tabs by mouth two times a day x10 days 5)  Tessalon 200 Mg Caps (Benzonatate) .... Take one capsule by mouth three times a day as needed for cough 6)  Omeprazole 20 Mg Cpdr (Omeprazole) .... One by mouth daily  Patient Instructions: 1)  This is an early sinus infection 2)  Take the Amoxicillin as directed- take w/ food to avoid upset stomach 3)  Drink plenty of fluids 4)  Continue the Mucinex and claritin D 5)  Tessalon as needed for cough 6)  REST! 7)  Tylenol/Ibuprofen  as needed for pain/fever 8)  Hang in there! Prescriptions: OMEPRAZOLE 20 MG CPDR (OMEPRAZOLE) one by mouth daily  #30 x 3   Entered and Authorized by:   Neena Rhymes MD   Signed by:   Neena Rhymes MD on 06/29/2010   Method used:   Electronically to        [facility] #[id number]* (retail)       [address]       [place], [place]  [address]       Ph: [phone]       Fax: [phone]   RxID:   [id number] TESSALON 200 MG CAPS (BENZONATATE) Take one capsule by mouth three times a day as needed for cough  #60 x 0   Entered and Authorized by:   Neena Rhymes MD   Signed by:   Neena Rhymes MD on 06/29/2010   Method used:   Electronically to        [facility] [phone]* (retail)       [address]       [place], [place]  [address]       Ph: [phone]       Fax: [phone]   RxID:   [id number] AMOXICILLIN 500 MG TABS (AMOXICILLIN) 2 tabs by mouth two times a day x10 days  #40 x 0   Entered and Authorized by:   Neena Rhymes MD   Signed by:   Neena Rhymes MD on 06/29/2010   Method used:   Electronically to        [facility] #[id number]* (retail)       [address]       [place], [place]  [address]       Ph: [phone]       Fax: [phone]   RxID:   [id number]    Orders Added: 1)  Est. Patient Level III [[address]]

## 2010-08-23 NOTE — Letter (Signed)
Summary: [facility]  Patient [facility] Return [facility]s/[facility]   Imported By: Lanelle Bal 03/29/2010 10:23:54  _____________________________________________________________________  External Attachment:    Type:   Image     Comment:   External Document

## 2010-08-23 NOTE — Assessment & Plan Note (Signed)
Summary: threw back out//fd   Vital Signs:  Patient profile:   40 year old female Weight:      273.4 pounds Pulse rate:   72 / minute Pulse rhythm:   regular BP sitting:   132 / 82  (right arm) Cuff size:   large  Vitals Entered By: Almeta Monas CMA Duncan Dull) (May 12, 2010 1:19 PM) CC: c/o degenerative disk disease causing her to have severe back pain., Back Pain   History of Present Illness:       This is a 40 year old woman who presents with Back Pain.  The symptoms began 3 days ago.  Pt with a hx of back pain and has a flare periodically.  She does not want pain meds--- steroid pack usually treats it great!.  The patient denies fever, chills, weakness, loss of sensation, fecal incontinence, urinary incontinence, urinary retention, dysuria, rest pain, inability to work, and inability to care for self.  The pain is located in the right thoracic back.  The pain began at home, gradually, and after lifting.  The pain is made worse by flexion, extension, and activity.  The pain is made better by inactivity.    Current Medications (verified): 1)  One-A-Day Womens Vitamins .... Take 1 Tab Once Daily 2)  Topamax 50 Mg Tabs (Topiramate) .Marland Kitchen.. 1 By Mouth Two Times A Day 3)  Bd Assure Bpm/manual Arm Cuff  Misc (Blood Pressure Monitoring) .... As Directed Dx 401.9 4)  Adderall 20 Mg Tabs (Amphetamine-Dextroamphetamine) .Marland Kitchen.. 1 By Mouth Two Times A Day 5)  Prednisone 20 Mg Tabs (Prednisone) .Marland Kitchen.. 1 By Mouth Three Times A Day  Allergies (verified): 1)  ! Toradol 2)  ! Morphine 3)  ! * Latex  Past History:  Past medical, surgical, family and social histories (including risk factors) reviewed for relevance to current acute and chronic problems.  Past Medical History: Reviewed history from 02/10/2010 and no changes required. Headache add G1p1  Past Surgical History: Reviewed history from 03/05/2007 and no changes required. Appendectomy (1986) Carpal tunnel release (2000) Cervical  fusion 08/24/99, 04/26/00, 05/09/01 l shoulder surgery 6/04 L wrist 11/30/03 r shoulder 09/12/04 tongue polyp `1610  Family History: Reviewed history from 03/26/2009 and no changes required. Family History Diabetes 1st degree relative Family History Thyroid disease Family History Hypertension F - hodkins lymphoma Family History Depression--  bipolar  Social History: Reviewed history from 10/25/2007 and no changes required. Occupation:[employer] Married Alcohol use-yes Drug use-no Regular exercise-no Former Smoker  Review of Systems      See HPI  Physical Exam  General:  Well-developed,well-nourished,in no acute distress; alert,appropriate and cooperative throughout examination Msk:  pain with flexion and ext of back no radiation of pain  Extremities:  No clubbing, cyanosis, edema, or deformity noted with normal full range of motion of all joints.   Neurologic:  gait normal and DTRs symmetrical and normal.   Psych:  Oriented X3 and normally interactive.     Impression & Recommendations:  Problem # 1:  PAIN IN THORACIC SPINE (ICD-724.1)  prednisone for 5 days  advil / motrin as needed pain  Discussed use of moist heat or ice, modified activities, medications, and stretching/strengthening exercises. Back care instructions given. To be seen in 2 weeks if no improvement; sooner if worsening of symptoms.   Complete Medication List: 1)  One-a-day Womens Vitamins  .... Take 1 tab once daily 2)  Topamax 50 Mg Tabs (Topiramate) .Marland Kitchen.. 1 by mouth two times a day 3)  Bd Assure Bpm/manual Arm Cuff Misc (Blood pressure monitoring) .... As directed dx 401.9 4)  Adderall 20 Mg Tabs (Amphetamine-dextroamphetamine) .Marland Kitchen.. 1 by mouth two times a day 5)  Prednisone 20 Mg Tabs (Prednisone) .Marland Kitchen.. 1 by mouth three times a day Prescriptions: ADDERALL 20 MG TABS (AMPHETAMINE-DEXTROAMPHETAMINE) 1 by mouth two times a day  #60 x 0   Entered and Authorized by:   Loreen Freud DO   Signed by:   Loreen Freud DO on 05/12/2010   Method used:   Print then Give to Patient   RxID:   [id number] PREDNISONE 20 MG TABS (PREDNISONE) 1 by mouth three times a day  #15 x 0   Entered and Authorized by:   Loreen Freud DO   Signed by:   Loreen Freud DO on 05/12/2010   Method used:   Electronically to        [facility] #[id number]* (retail)       [address]       [place], [place]  [address]       Ph: [phone]       Fax: [phone]   RxID:   [id number]    Orders Added: 1)  Est. Patient Level III [[address]]

## 2010-08-23 NOTE — Letter (Signed)
Summary: Out of Work  [facility] [employer]  [address] [place], [place] [address]   Phone: [phone]  Fax: [phone]    February 10, 2010   Employee:  ZYAIRA VEJAR [facility] [employer]    To Whom It May Concern:   For Medical reasons, please excuse the above named employee from work for the following dates:  Start:February 10, 2010  End: February 11, 2010 If you need additional information, please feel free to contact our office.         Sincerely,       Loreen Freud, DO

## 2010-08-23 NOTE — Assessment & Plan Note (Signed)
Summary: 3 WEEK FOLLOWUP////SPH   Vital Signs:  Patient profile:   40 year old female Height:      67.75 inches Weight:      263 pounds Temp:     98.2 degrees F oral Pulse rate:   72 / minute BP sitting:   110 / 58  (left arm)  Vitals Entered By: Jeremy Johann CMA (March 02, 2010 10:45 AM) CC: 3 WEEK F/U HEADACHE   History of Present Illness: Pt here to f/u headaches ---she is doing better-- since last visit she has had 2 headaches---1 lasted 2 days.  Pt will start topamax 50 mg two times a day next week.   No other complaints.     She would also like to start adderall again.  She is struggling to focus and get things done.   pt is not breast feeding.  Current Medications (verified): 1)  One-A-Day Womens Vitamins .... Take 1 Tab Once Daily 2)  Topamax 25 Mg Tabs (Topiramate) .Marland Kitchen.. 1 By Mouth At Bedtime For 1 Week Then 1 By Mouth Two Times A Day For 1 Week Then 1 By Mouth Qam and 2 By Mouth Qpm 3)  Topamax 50 Mg Tabs (Topiramate) .Marland Kitchen.. 1 By Mouth Two Times A Day 4)  Bd Assure Bpm/manual Arm Cuff  Misc (Blood Pressure Monitoring) .... As Directed Dx 401.9  Allergies (verified): 1)  ! Toradol 2)  ! Morphine 3)  ! * Latex  Physical Exam  General:  Well-developed,well-nourished,in no acute distress; alert,appropriate and cooperative throughout examination Lungs:  Normal respiratory effort, chest expands symmetrically. Lungs are clear to auscultation, no crackles or wheezes. Heart:  normal rate and no murmur.   Psych:  Cognition and judgment appear intact. Alert and cooperative with normal attention span and concentration. No apparent delusions, illusions, hallucinations   Impression & Recommendations:  Problem # 1:  COMMON MIGRAINE (ICD-346.10) con't topamax  rto 6 months or sooner as needed   Problem # 2:  ADD (ICD-314.00) start adderall again rto 6 months or sooner as needed   Complete Medication List: 1)  One-a-day Womens Vitamins  .... Take 1 tab once daily 2)   Topamax 50 Mg Tabs (Topiramate) .Marland Kitchen.. 1 by mouth two times a day 3)  Bd Assure Bpm/manual Arm Cuff Misc (Blood pressure monitoring) .... As directed dx 401.9 4)  Adderall 20 Mg Tabs (Amphetamine-dextroamphetamine) .Marland Kitchen.. 1 by mouth two times a day  Patient Instructions: 1)  Please schedule a follow-up appointment in 6 months .  Prescriptions: ADDERALL 20 MG TABS (AMPHETAMINE-DEXTROAMPHETAMINE) 1 by mouth two times a day  #60 x 0   Entered and Authorized by:   Loreen Freud DO   Signed by:   Loreen Freud DO on 03/02/2010   Method used:   Print then Give to Patient   RxID:   [id number] TOPAMAX 50 MG TABS (TOPIRAMATE) 1 by mouth two times a day  #60 x 5   Entered and Authorized by:   Loreen Freud DO   Signed by:   Loreen Freud DO on 03/02/2010   Method used:   Print then Give to Patient   RxID:   [id number]

## 2010-08-23 NOTE — Progress Notes (Signed)
Summary: refill  Phone Note Refill Request Call back at Home Phone [phone]   Refills Requested: Medication #1:  ADDERALL  10 MG TABS (AMPHETAMINE-DEXTROAMPHETAMINE) 2 by mouth two times a day as needed. Message left on triage VM pt requesting a refill on the following med.Marland KitchenMarland KitchenFelecia Deloach CMA  April 19, 2010 3:24 PM   Initial call taken by: Jeremy Johann CMA,  April 19, 2010 3:23 PM  Follow-up for Phone Call        refill x1 Follow-up by: Loreen Freud DO,  April 19, 2010 4:04 PM    Prescriptions: ADDERALL  10 MG TABS (AMPHETAMINE-DEXTROAMPHETAMINE) 2 by mouth two times a day as needed  #120 x 0   Entered by:   Almeta Monas CMA (AAMA)   Authorized by:   Loreen Freud DO   Signed by:   Almeta Monas CMA (AAMA) on 04/19/2010   Method used:   Print then Give to Patient   RxID:   [id number]  Rx printed and left at check in..... Pt notified.... Almeta Monas CMA Duncan Dull)  April 19, 2010 4:43 PM

## 2010-08-23 NOTE — Letter (Signed)
Summary: Out of Work  [employer] [facility] [employer]  [address] [place], [place] [address]   Phone: [phone]  Fax: [phone]    February 24, 2010   Employee:  Jo Jackson [facility]    To Whom It May Concern:   For Medical reasons, please excuse the above named employee from work for the following dates:  Start:  February 24, 2010 End:  February 25, 2010 If you need additional information, please feel free to contact our office.         Sincerely,      Loreen Freud, DO

## 2010-08-23 NOTE — Progress Notes (Signed)
Summary: SE of Topamax/work note  Phone Note Call from Patient Call back at Home Phone [phone]   Caller: Patient Summary of Call: Pt called and states that she works in the heat. She was reading the possible SE of the Topamax. And it states that it can increase body temperature. She is concerned because she is already having a problem with the heat and her HA's. She would like a note out of work for [employer] and tomorrow night so that she has a change to adjust to the [employer]. Please advise. Army Fossa CMA  February 10, 2010 2:26 PM   Follow-up for Phone Call        I tried to call pt back to let her know it may be tomorrow before we get back to her. I had to leave a message for the pt. Army Fossa CMA  February 10, 2010 4:49 PM   Additional Follow-up for Phone Call Additional follow up Details #1::        I spoke with pt and she is aware it will be tomorrow. She said that she has felt tired since she took the pill that she was given in the office. She is just concerned about working in the heat. Army Fossa CMA  February 10, 2010 4:55 PM     Additional Follow-up for Phone Call Additional follow up Details #2::    ok to give note I ve never had anyone tell me topamax caused a problem ---if it does we will need to change it Follow-up by: Loreen Freud DO,  February 10, 2010 5:00 PM  Additional Follow-up for Phone Call Additional follow up Details #3:: Details for Additional Follow-up Action Taken: pt aware note ready for pick up.Marland KitchenMarland KitchenFelecia Deloach CMA  February 10, 2010 5:05 PM

## 2010-08-25 NOTE — Assessment & Plan Note (Signed)
Summary: TO DISCUSS THE ADDERALL MED//PH   Vital Signs:  Patient profile:   41 year old female Weight:      276.6 pounds Pulse rate:   84 / minute Pulse rhythm:   regular BP sitting:   130 / 70  (left arm) Cuff size:   large  Vitals Entered By: Almeta Monas CMA Duncan Dull) (August 11, 2010 3:21 PM) CC: wants to discuss Adderall and lack of energy   History of Present Illness: Pt here f/u ADD.  Pt would like to go back on adderall.  No other complaints.  Current Medications (verified): 1)  One-A-Day Womens Vitamins .... Take 1 Tab Once Daily 2)  Bd Assure Bpm/manual Arm Cuff  Misc (Blood Pressure Monitoring) .... As Directed Dx 401.9 3)  Adderall 20 Mg Tabs (Amphetamine-Dextroamphetamine) .Marland Kitchen.. 1 By Mouth Two Times A Day 4)  Tessalon 200 Mg Caps (Benzonatate) .... Take One Capsule By Mouth Three Times A Day As Needed For Cough 5)  Omeprazole 20 Mg Cpdr (Omeprazole) .... One By Mouth Daily 6)  Ventolin Hfa 108 (90 Base) Mcg/act Aers (Albuterol Sulfate) .... 2 Puffs Every 4 Hrs As Needed For Cough or Wheezing  Allergies (verified): 1)  ! Toradol 2)  ! Morphine 3)  ! * Latex  Past History:  Past Medical History: Last updated: 02/10/2010 Headache add G1p1  Past Surgical History: Last updated: 03/05/2007 Appendectomy (1986) Carpal tunnel release (2000) Cervical fusion 08/24/99, 04/26/00, 05/09/01 l shoulder surgery 6/04 L wrist 11/30/03 r shoulder 09/12/04 tongue polyp `6045  Family History: Last updated: 03/26/2009 Family History Diabetes 1st degree relative Family History Thyroid disease Family History Hypertension F - hodkins lymphoma Family History Depression--  bipolar  Social History: Last updated: 10/25/2007 Occupation:[employer] Married Alcohol use-yes Drug use-no Regular exercise-no Former Smoker  Risk Factors: Alcohol Use: <1 (02/10/2010) Caffeine Use: 1 (02/10/2010) Exercise: no (02/10/2010)  Risk Factors: Smoking Status: quit  (10/25/2007) Packs/Day: 1 (01/31/2007) Passive Smoke Exposure: yes (03/05/2007)  Family History: Reviewed history from 03/26/2009 and no changes required. Family History Diabetes 1st degree relative Family History Thyroid disease Family History Hypertension F - hodkins lymphoma Family History Depression--  bipolar  Social History: Reviewed history from 10/25/2007 and no changes required. Occupation:[employer] Married Alcohol use-yes Drug use-no Regular exercise-no Former Smoker  Review of Systems      See HPI  Physical Exam  General:  Well-developed,well-nourished,in no acute distress; alert,appropriate and cooperative throughout examination Lungs:  Normal respiratory effort, chest expands symmetrically. Lungs are clear to auscultation, no crackles or wheezes. Heart:  normal rate and no murmur.   Extremities:  No clubbing, cyanosis, edema, or deformity noted with normal full range of motion of all joints.   Psych:  Cognition and judgment appear intact. Alert and cooperative with normal attention span and concentration. No apparent delusions, illusions, hallucinations   Impression & Recommendations:  Problem # 1:  ADD (ICD-314.00) refill adderall rto 6 months  Problem # 2:  FATIGUE (ICD-780.79)  Orders: Venipuncture (40981) TLB-BMP (Basic Metabolic Panel-BMET) (80048-METABOL) TLB-CBC Platelet - w/Differential (85025-CBCD) TLB-Hepatic/Liver Function Pnl (80076-HEPATIC) TLB-TSH (Thyroid Stimulating Hormone) (84443-TSH) TLB-B12 + Folate Pnl (19147_82956-O13/YQM) Specimen Handling (57846)  Problem # 3:  HAIR LOSS (ICD-704.00)  Orders: Venipuncture (96295) TLB-BMP (Basic Metabolic Panel-BMET) (80048-METABOL) TLB-CBC Platelet - w/Differential (85025-CBCD) TLB-Hepatic/Liver Function Pnl (80076-HEPATIC) TLB-TSH (Thyroid Stimulating Hormone) (84443-TSH) TLB-B12 + Folate Pnl (28413_24401-U27/OZD) Specimen Handling (66440)  Complete Medication List: 1)  One-a-day Womens  Vitamins  .... Take 1 tab once daily 2)  Bd Assure  Bpm/manual Arm Cuff Misc (Blood pressure monitoring) .... As directed dx 401.9 3)  Adderall 20 Mg Tabs (Amphetamine-dextroamphetamine) .Marland Kitchen.. 1 by mouth two times a day 4)  Tessalon 200 Mg Caps (Benzonatate) .... Take one capsule by mouth three times a day as needed for cough 5)  Omeprazole 20 Mg Cpdr (Omeprazole) .... One by mouth daily 6)  Ventolin Hfa 108 (90 Base) Mcg/act Aers (Albuterol sulfate) .... 2 puffs every 4 hrs as needed for cough or wheezing Prescriptions: ADDERALL 20 MG TABS (AMPHETAMINE-DEXTROAMPHETAMINE) 1 by mouth two times a day  #60 x 0   Entered and Authorized by:   Loreen Freud DO   Signed by:   Loreen Freud DO on 08/11/2010   Method used:   Print then Give to Patient   RxID:   [id number]    Orders Added: 1)  Venipuncture [14782] 2)  TLB-BMP (Basic Metabolic Panel-BMET) [80048-METABOL] 3)  TLB-CBC Platelet - w/Differential [85025-CBCD] 4)  TLB-Hepatic/Liver Function Pnl [80076-HEPATIC] 5)  TLB-TSH (Thyroid Stimulating Hormone) [84443-TSH] 6)  TLB-B12 + Folate Pnl [82746_82607-B12/FOL] 7)  Specimen Handling [99000] 8)  Est. Patient Level III [95621]

## 2010-08-25 NOTE — Progress Notes (Signed)
Summary: Prior Auth APPROVED Adderall [employer]  Phone Note Refill Request   Refills Requested: Medication #1:  ADDERALL 20 MG TABS 1 by mouth two times a day Awaiting fax...............Marland KitchenFelecia Deloach CMA  August 15, 2010 3:49 PM    Follow-up for Phone Call        Approved from 07-25-10  until 08-15-11, pharmacy faxed, approval letter scan to chart.................Marland KitchenFelecia Deloach CMA  August 16, 2010 11:05 AM

## 2010-08-25 NOTE — Assessment & Plan Note (Signed)
Summary: HEAD AND CHEST COLD//PH   Vital Signs:  Patient profile:   40 year old female Weight:      282 pounds BMI:     43.35 Temp:     98.2 degrees F oral BP sitting:   124 / 78  (left arm)  Vitals Entered By: Doristine Devoid CMA (July 22, 2010 2:42 PM) CC: HA along w/ cough and chest congestion    History of Present Illness: 40 yo woman here today w/ cough and chest congestion.  seen recently on 12/7 for sinus infxn.  pt feels sinuses have improved somewhat but cough is deeper, chest is tighter.  cough is productive w/ mucinex.  tessalon was somewhat helpful.  no fevers.  + sick contacts.  Current Medications (verified): 1)  One-A-Day Womens Vitamins .... Take 1 Tab Once Daily 2)  Bd Assure Bpm/manual Arm Cuff  Misc (Blood Pressure Monitoring) .... As Directed Dx 401.9 3)  Adderall 20 Mg Tabs (Amphetamine-Dextroamphetamine) .Marland Kitchen.. 1 By Mouth Two Times A Day 4)  Tessalon 200 Mg Caps (Benzonatate) .... Take One Capsule By Mouth Three Times A Day As Needed For Cough 5)  Omeprazole 20 Mg Cpdr (Omeprazole) .... One By Mouth Daily  Allergies (verified): 1)  ! Toradol 2)  ! Morphine 3)  ! * Latex  Review of Systems      See HPI  Physical Exam  General:  Well-developed,well-nourished,in no acute distress; alert,appropriate and cooperative throughout examination Head:  NCAT, + TTP R frontal sinus Eyes:  no injxn or inflammation Ears:  TMs retracted bilaterally Nose:  + congestion Mouth:  + PND Neck:  No deformities, masses, or tenderness noted. Lungs:  Normal respiratory effort, chest expands symmetrically. faint expiratory wheezes at the bases.  hacking cough Heart:  normal rate and no murmur.     Impression & Recommendations:  Problem # 1:  SINUSITIS - ACUTE-NOS (ICD-461.9) Assessment Unchanged pt's sxs are persisting and cough is worsening.  start Avelox. Her updated medication list for this problem includes:    Tessalon 200 Mg Caps (Benzonatate) .Marland Kitchen... Take one capsule  by mouth three times a day as needed for cough    Avelox 400 Mg Tabs (Moxifloxacin hcl) .Marland Kitchen... 1 tablet by mouth daily x10 days    Cheratussin Ac 100-10 Mg/45ml Syrp (Guaifenesin-codeine) .Marland Kitchen... 1-2 tsps q4-6 as needed for cough.  disp  Problem # 2:  BRONCHITIS- ACUTE (ICD-466.0) Assessment: New start avelox, cough meds and inhaler.  reviewed supportive care and red flags that should prompt return.  Pt expresses understanding and is in agreement w/ this plan. Her updated medication list for this problem includes:    Tessalon 200 Mg Caps (Benzonatate) .Marland Kitchen... Take one capsule by mouth three times a day as needed for cough    Avelox 400 Mg Tabs (Moxifloxacin hcl) .Marland Kitchen... 1 tablet by mouth daily x10 days    Cheratussin Ac 100-10 Mg/27ml Syrp (Guaifenesin-codeine) .Marland Kitchen... 1-2 tsps q4-6 as needed for cough.  disp    Ventolin Hfa 108 (90 Base) Mcg/act Aers (Albuterol sulfate) .Marland Kitchen... 2 puffs every 4 hrs as needed for cough or wheezing  Complete Medication List: 1)  One-a-day Womens Vitamins  .... Take 1 tab once daily 2)  Bd Assure Bpm/manual Arm Cuff Misc (Blood pressure monitoring) .... As directed dx 401.9 3)  Adderall 20 Mg Tabs (Amphetamine-dextroamphetamine) .Marland Kitchen.. 1 by mouth two times a day 4)  Tessalon 200 Mg Caps (Benzonatate) .... Take one capsule by mouth three times a day as  needed for cough 5)  Omeprazole 20 Mg Cpdr (Omeprazole) .... One by mouth daily 6)  Avelox 400 Mg Tabs (Moxifloxacin hcl) .Marland Kitchen.. 1 tablet by mouth daily x10 days 7)  Cheratussin Ac 100-10 Mg/39ml Syrp (Guaifenesin-codeine) .Marland Kitchen.. 1-2 tsps q4-6 as needed for cough.  disp 8)  Ventolin Hfa 108 (90 Base) Mcg/act Aers (Albuterol sulfate) .... 2 puffs every 4 hrs as needed for cough or wheezing  Patient Instructions: 1)  The Avelox will take care of this! 2)  Use the codeine syrup for night or weekend cough- will cause drowsiness 3)  Use the inhaler as needed for cough or wheezing 4)  REST! 5)  Hang in there! 6)   Happy New Year! Prescriptions: VENTOLIN HFA 108 (90 BASE) MCG/ACT AERS (ALBUTEROL SULFATE) 2 puffs every 4 hrs as needed for cough or wheezing  #1 x 1   Entered and Authorized by:   Neena Rhymes MD   Signed by:   Neena Rhymes MD on 07/22/2010   Method used:   Electronically to        [facility] [address] [id number]* (retail)       [address]       [place], [place]  [address]       Ph: [phone]       Fax: [phone]   RxID:   [id number] CHERATUSSIN AC 100-10 MG/5ML SYRP (GUAIFENESIN-CODEINE) 1-2 tsps q4-6 as needed for cough.  disp  #150 x 0   Entered and Authorized by:   Neena Rhymes MD   Signed by:   Neena Rhymes MD on 07/22/2010   Method used:   Print then Give to Patient   RxID:   [id number] AVELOX 400 MG  TABS (MOXIFLOXACIN HCL) 1 tablet by mouth daily x10 days  #10 x 0   Entered and Authorized by:   Neena Rhymes MD   Signed by:   Neena Rhymes MD on 07/22/2010   Method used:   Electronically to        [facility] [id number]* (retail)       [address]       [place], [place]  [address]       Ph: [phone]       Fax: [phone]   RxID:   [id number]    Orders Added: 1)  Est. Patient Level III [25427]

## 2010-08-31 NOTE — Medication Information (Signed)
Summary: Prior Authorization & Approval for Adderall/[facility]  Prior Authorization & Approval for Adderall/[facility]   Imported By: Lanelle Bal 08/25/2010 09:21:46  _____________________________________________________________________  External Attachment:    Type:   Image     Comment:   External Document

## 2010-09-29 ENCOUNTER — Telehealth (INDEPENDENT_AMBULATORY_CARE_PROVIDER_SITE_OTHER): Payer: Self-pay | Admitting: *Deleted

## 2010-10-04 NOTE — Progress Notes (Signed)
Summary: adderral refill  Phone Note Refill Request Message from:  Patient on September 29, 2010 3:39 PM  Refills Requested: Medication #1:  ADDERALL 20 MG TABS 1 by mouth two times a day Patient needs Adderall prescription refilled.  Advised patient that it will be ready in 24 hours for pickup unless someone calls them  Next Appointment Scheduled: none Initial call taken by: Jerolyn Shin,  September 29, 2010 3:39 PM    Prescriptions: ADDERALL 20 MG TABS (AMPHETAMINE-DEXTROAMPHETAMINE) 1 by mouth two times a day  #60 x 0   Entered by:   Almeta Monas CMA (AAMA)   Authorized by:   Loreen Freud DO   Signed by:   Almeta Monas CMA (AAMA) on 09/29/2010   Method used:   Print then Give to Patient   RxID:   [id number]

## 2010-10-07 ENCOUNTER — Other Ambulatory Visit: Payer: Self-pay | Admitting: Podiatry

## 2010-10-10 LAB — CBC
HCT: 32.3 % — ABNORMAL LOW (ref 36.0–46.0)
HCT: 33.6 % — ABNORMAL LOW (ref 36.0–46.0)
Hemoglobin: 11.4 g/dL — ABNORMAL LOW (ref 12.0–15.0)
MCHC: 35 g/dL (ref 30.0–36.0)
MCHC: 35.2 g/dL (ref 30.0–36.0)
Platelets: 145 10*3/uL — ABNORMAL LOW (ref 150–400)
Platelets: 153 10*3/uL (ref 150–400)
Platelets: 173 10*3/uL (ref 150–400)
RBC: 3.53 MIL/uL — ABNORMAL LOW (ref 3.87–5.11)
RBC: 3.67 MIL/uL — ABNORMAL LOW (ref 3.87–5.11)
RBC: 3.96 MIL/uL (ref 3.87–5.11)
RDW: 13 % (ref 11.5–15.5)
RDW: 13.4 % (ref 11.5–15.5)
RDW: 13.4 % (ref 11.5–15.5)
WBC: 10.1 10*3/uL (ref 4.0–10.5)
WBC: 7.1 10*3/uL (ref 4.0–10.5)

## 2010-10-10 LAB — COMPREHENSIVE METABOLIC PANEL
ALT: 13 U/L (ref 0–35)
AST: 16 U/L (ref 0–37)
Chloride: 104 mEq/L (ref 96–112)
GFR calc Af Amer: >60 mL/min (ref >60)
GFR calc non Af Amer: >60 mL/min (ref >60)
Potassium: 3.9 mEq/L (ref 3.5–5.1)
Sodium: 134 mEq/L — ABNORMAL LOW (ref 135–145)

## 2010-10-10 LAB — LACTATE DEHYDROGENASE: LDH: 123 U/L (ref 94–250)

## 2010-10-10 LAB — URIC ACID: Uric Acid, Serum: 4.1 mg/dL (ref 2.4–7.0)

## 2010-10-11 LAB — CBC
HCT: 37.7 % (ref 36.0–46.0)
Hemoglobin: 12.4 g/dL (ref 12.0–15.0)
Hemoglobin: 13.3 g/dL (ref 12.0–15.0)
MCHC: 34.7 g/dL (ref 30.0–36.0)
MCHC: 35.2 g/dL (ref 30.0–36.0)
MCV: 90.3 fL (ref 78.0–100.0)
Platelets: 196 K/uL (ref 150–400)
RBC: 4.18 MIL/uL (ref 3.87–5.11)
RDW: 13.8 % (ref 11.5–15.5)
RDW: 12.8 % (ref 11.5–15.5)
WBC: 8.6 10*3/uL (ref 4.0–10.5)
WBC: 8.5 K/uL (ref 4.0–10.5)

## 2010-10-11 LAB — COMPREHENSIVE METABOLIC PANEL
AST: 17 U/L (ref 0–37)
Calcium: 8.9 mg/dL (ref 8.4–10.5)
Chloride: 105 mEq/L (ref 96–112)
GFR calc non Af Amer: >60 mL/min (ref >60)
Glucose, Bld: 80 mg/dL (ref 70–99)
Potassium: 3.6 mEq/L (ref 3.5–5.1)
Total Bilirubin: 0.5 mg/dL (ref 0.3–1.2)
Total Protein: 6 g/dL (ref 6.0–8.3)

## 2010-10-11 LAB — URINALYSIS, ROUTINE W REFLEX MICROSCOPIC
Bilirubin Urine: NEGATIVE
Bilirubin Urine: NEGATIVE
Glucose, UA: NEGATIVE mg/dL
Hgb urine dipstick: NEGATIVE
Ketones, ur: 40 mg/dL — AB
Ketones, ur: NEGATIVE mg/dL
Nitrite: NEGATIVE
Protein, ur: NEGATIVE mg/dL
Specific Gravity, Urine: 1.025 (ref 1.005–1.030)
pH: 6.5 (ref 5.0–8.0)

## 2010-10-11 LAB — COMPREHENSIVE METABOLIC PANEL WITH GFR
ALT: 14 U/L (ref 0–35)
AST: 19 U/L (ref 0–37)
Albumin: 2.7 g/dL — ABNORMAL LOW (ref 3.5–5.2)
Alkaline Phosphatase: 92 U/L (ref 39–117)
BUN: 3 mg/dL — ABNORMAL LOW (ref 6–23)
CO2: 22 meq/L (ref 19–32)
Calcium: 9 mg/dL (ref 8.4–10.5)
Chloride: 104 meq/L (ref 96–112)
Creatinine, Ser: 0.54 mg/dL (ref 0.4–1.2)
GFR calc non Af Amer: >60 mL/min
Glucose, Bld: 116 mg/dL — ABNORMAL HIGH (ref 70–99)
Potassium: 3.7 meq/L (ref 3.5–5.1)
Sodium: 135 meq/L (ref 135–145)
Total Bilirubin: 0.5 mg/dL (ref 0.3–1.2)
Total Protein: 6.6 g/dL (ref 6.0–8.3)

## 2010-10-11 LAB — URINE MICROSCOPIC-ADD ON

## 2010-10-11 LAB — LACTATE DEHYDROGENASE: LDH: 119 U/L (ref 94–250)

## 2010-10-11 LAB — URIC ACID
Uric Acid, Serum: 4.7 mg/dL (ref 2.4–7.0)
Uric Acid, Serum: 3.7 mg/dL (ref 2.4–7.0)

## 2010-11-07 ENCOUNTER — Other Ambulatory Visit: Payer: Self-pay | Admitting: *Deleted

## 2010-11-07 MED ORDER — AMPHETAMINE-DEXTROAMPHETAMINE 20 MG PO TABS: 20.0000 mg | ORAL_TABLET | Freq: Two times a day (BID) | ORAL | Status: DC

## 2010-11-07 NOTE — Telephone Encounter (Signed)
Pt aware Rx ready for pick up 

## 2010-12-09 NOTE — H&P (Signed)
[place]. [facility]  Patient:    Jo Jackson, Jo Jackson                  MRN: [id number] Adm. Date:  08/24/99 Attending:  Hewitt Shorts, M.D. CC:         Hewitt Shorts, M.D.                         History and Physical  HISTORY OF PRESENT ILLNESS:  The patient is a 40 year old right-handed white female who has been a patient of mine for a number of years.  She had had difficulties  with carpal tunnel syndrome, and eventually underwent bilateral carpal tunnel releases last year.  She presents now for evaluation of a new problem.  She has  noted that she has been having pain and discomfort in her neck and across her shoulders in and around her scapula bilaterally, left worse than right.  She has been having pain that extends down into the posterior aspect of the left arm. he denies any numbness or paresthesias.  She does describe a sense of weakness in he left upper extremity.  She has had difficulty with neck pain for 8 years or so ut it worsened this past fall.  She was sent for physical therapy this past fall for a couple of months and was doing home traction and she found that both the physical therapy and the traction came her some transient relief but no lasting relief.   Associated with all of this some headaches.  She denies any numbness or paresthesias.  She does have a sense of weakness in her left upper extremity. he continues to work part time at [employer] and has to lift up to about 70 pounds working about 25 hours per week usually.  The patient was studied with MRI scan which showed degenerative changes at C6-7 and her left C6-7 with spondylitic disk protrusion encroaching upon the left C6-7 foramen.  X-rays in the past have shown degenerative disk disease with anterior and posterior hypertrophic spurs and bilateral foraminal encroachment due to the spurring.  PAST MEDICAL HISTORY:  There is no history of  hypertension, myocardial infarction, cancer, stroke, diabetes, peptic ulcer disease or lung disease.  PAST SURGICAL HISTORY:  Includes: 1. Left knee arthroscopy in 1988 2. Appendectomy in 1996 3. Norplant implants and removal of Norplant implants. 4. A left carpal tunnel release in April of 2000, and a right carpal tunnel release    in March of 2000.  ALLERGIES:  She reports allergies to TORADOL, LATEX, and PAPER TAPE.  CURRENT MEDICATIONS: 1. Wellbutrin SR 30 mg q. day. 2. She also had recently been treated for a sinus infection and she has been using    some pain medication.  FAMILY HISTORY:  Her mother has a history of carpal tunnel syndrome and has had  carpal tunnel releases by Dr. Chaney Malling.  She has fibromyalgia and degenerative  disk disease.  Her father has passed on.  He had Hodgkins disease.  He died in 18.  There is a family history of hypertension, fibromyalgia, irritable bowel  syndrome, degenerative disk disease, osteoarthritis, cerebral cancer, and Hodgkins disease.  SOCIAL HISTORY:  The patient was married about 1-1/2 years ago.  She works at [employer], she does not smoke.  She quit a couple of years ago.  She drinks socially.  REVIEW OF SYSTEMS:  Notable for depression and recent sinus infection but otherwise is  unremarkable other than for the symptoms described in the history of present  illness and past medical history.  PHYSICAL EXAMINATION:  GENERAL:  The patient is well-developed, well-nourished somewhat heavy set white female in no acute distress.  She is 5 foot 6-1/2, 230 pounds.  VITAL SIGNS:  She is afebrile.  Temperature 97.7, pulse 84, blood pressure 108/68, respiratory rate 20.  LUNGS:  Clear to auscultation.  HEART:  Regular rate and rhythm.  Normal S1, S2 with no murmur.  ABDOMEN:  Soft, nontender, bowel sounds are present.  MUSCULOSKELETAL:  Shows mild diffuse tenderness throughout the cervical region.  Mobility of the neck is  somewhat limited due to the discomfort.  NEUROLOGIC:  On motor exam there is weakness of the left triceps 4-4+/5.  The remainder of the upper extremity strength including the deltoid and biceps as well as the right triceps and the intrinsics and grip bilaterally are 5/5. SEnsation is intact to pinprick throughout the digits of the extremities.  Reflexes are 2 at the biceps and brachialis bilaterally.  The left triceps is 1, the right is 2. Quadriceps and gastrocnemius are 2 bilaterally, toes are downgoing bilaterally.  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  PLAN:  Single level C6-7 anterior cervical diskectomy and arthrodesis with Allografts and cervical plating.  We have discussed the nature of surgery, typical length of surgery, hospital stay and overall recuperation.  The need for postoperative immobilization and soft cervical collar and limitations during the postoperative period.  We also discussed the risks of surgery including of infection, bleeding, possible need for transfusion, the risk of nerve root dysfunction with pain, weakness, numbness, or paresthesia.  The risk of failure, failure to ______  particularly in the light of her smoking now, and anesthetic  risks of myocardial infarction, stroke, or even death all of which are increased due to her smoking.  She does wish to proceed with surgery and is admitted for such. DD:  08/24/99 TD:  08/24/99 Job: [id number]

## 2010-12-09 NOTE — Op Note (Signed)
   NAME:  [name], [name]             ACCOUNT NO.:  [id number]   MEDICAL RECORD NO.:  [id number]                   PATIENT TYPE:  REC   LOCATION:  [facility]                                  FACILITY:  [facility]   PHYSICIAN:  [name], M.D.                DATE OF BIRTH:  03/11/1971   DATE OF PROCEDURE:  DATE OF DISCHARGE:                                 OPERATIVE REPORT   Brenda [facility], I  evaluated and reviewed her health and history form and 14 point review of  systems.   1. Jo Jackson comes in today complaining of persistent cervicalgia and she does     lateralize her symptoms to the left. I think it is reasonable we proceed     at some time with either a transforaminal or facetal approach, but I     think to start [place] out here, I think it is important we begin with the     lumbar epidural as a broad brush stroke. LATEX  allergy noted. No latex     products used.  2. She will assess this within the context of activities of daily living and     if she does not have significant improvement would recommend going toward     a facetal injection. A positive provocative block would lead [place] to     consideration of radiofrequency neural oblation with prolonged release     cycling.  3. Do not believe further imaging or diagnostics warranted. Lifestyle     enhancement such as weight control are reviewed and instructed to     maintain contact with primary care. Home base therapy reviewed.   Reviewed her medications and she is appropriate.   Objectively, diffuse paracervical myofascial discomfort, impaired flexion  distention, lateral rotational pain, positive cervical facetal compression  test left greater than right but no neurological findings motor, sensory or  reflexive.   IMPRESSION:  Cervicalgia, degenerative spinal disease cervical spine,  cervical facet syndrome.   PLAN:  Cervical epidural. She has consented.   DESCRIPTION  OF PROCEDURE:  The patient taken to the fluoroscopy suite,  placed in upright position, neck prepped and draped in the usual fashion.  Using a Hustead needle, I advanced to the C6-7 interspace and no evidence of  CSF, heme or paresthesia. Test block uneventfully following 40 mg of  Aristocort and flush the needle.   Tolerated this procedure well with no complications from our procedure.  Discharge instructions given. We will see her in followup.                                               [name], M.D.    [facility]  D:  05/27/2002  T:  05/27/2002  Job:  [id number]

## 2010-12-09 NOTE — Op Note (Signed)
[place]  Patient:    Jo Jackson                 MRN: [id number] Proc. Date: 08/24/99 Adm. Date:  [id number] Attending:  Barton Fanny CC:         Hewitt Shorts, M.D.                           Operative Report  PREOPERATIVE DIAGNOSIS:  Cervical disk herniation, degenerative disk disease, spondylosis with radiculopathy.  POSTOPERATIVE DIAGNOSIS:  Cervical disk herniation, degenerative disk disease, spondylosis with radiculopathy.  OPERATION PERFORMED:  C6-7 anterior cervical diskectomy and arthrodesis with iliac crest allograft and A-line cervical plating.  SURGEON:  Hewitt Shorts, M.D.  ASSISTANT:  Danae Orleans. Venetia Maxon, M.D.  ANESTHESIA:  General endotracheal.  INDICATIONS FOR PROCEDURE:  The patient is a 40 year old woman who presented with neck and bilateral shoulder pain extending down to left arm with weakness of the left triceps, who was found by MRI scan to have degenerative disk disease at C6-7 with associated spondylitic spurring superimposed on spondylitic disk herniation encroaching on the left C6-7 foramen.  Decision was made to proceed with a single level anterior cervical diskectomy and arthrodesis.  DESCRIPTION OF PROCEDURE:  Notably, the patient had a history of a latex allergy and appropriate precautions were instituted by the operating room staff.  She also had a tape allergy and the wounds after suture closure with subcuticular sutures was coated with collodion rather than using Steri-Strips due to her tape allergy.  The patient was brought to the operating room and placed under general endotracheal anesthesia.  The patient was placed in 10 pounds of halter traction.  The neck as prepped with Betadine soap and solution and draped in sterile fashion.  A horizontal incision on the left side of the neck was made.  The line of the incision was infiltrated with local anesthetic with  epinephrine.  The incision itself was made with a Shaw scalpel at a temp of 120.  Dissection was carried down to the subcutaneous tissue.  Bipolar cautery and electrocautery were used to maintain hemostasis.  Dissection was carried down to the subcutaneous tissues and platysma and then dissection was carried out through an avascular plane leaving the sternocleidomastoid, carotid artery and jugular vein laterally and the trachea nd esophagus medially.  The ventral aspect of the vertebral column was identified nd the C6-7 intervertebral disk space identified.  There was a moderate sized osteophytic spur to the left side ventrally.  X-ray was taken to confirm the localization and then once the C6-7 level was confirmed, we proceeded with diskectomy.  The osteophyte was removed using an osteotome.  The annulus was incised and using using a variety of pituitary rongeurs, degenerated disk material was removed.  Cartilaginous end plates were removed using the microcurets and the Midas Rex drill with the A-2.  The microscope was draped and brought into the field to provide additional magnification, illumination and visualization.  The remainder of the procedure was performed using microdissection technique.  The cartilaginous end plates were carefully removed from both the C6 and C7 vertebral body surfaces. Posterior osteophytic overgrowth was removed as well and uncinate hypertrophy was removed.  The posterior longitudinal ligament was thickened.  This was carefully removed as well and the thecal sac, nerve roots and foramina were carefully decompressed bilaterally.  In the end all loose fragments of disk material were  removed and good decompression was achieved of the thecal sac and nerve roots and once the decompression was completed, the wound was irrigated extensively with bacitracin solution and hemostasis established with the use of Gelfoam soaked in thrombin; however, all the  Gelfoam was removed prior to proceeding with the arthrodesis.  We selected a wedge of iliac crest allograft which was positioned in the intervertebral disk space and countersunk.  Ventral osteophyte overgrowth was removed using osteophyte removal tool and the Midas Rex drill with a A-2 bur and then we selected a 26 mm A-line cervical plate which was positioned over the fusion construct and secured to the vertebrae with a pair of 4 x 12 mm screws at each level.  Each of the screw holes was drilled and tapped and the screw placed and  then once all four screws were secured, locking screws were placed.  The wound as again irrigated extensively with bacitracin solution, checked for hemostasis once again and then we proceeded with closure.  The platysma was closed with interrupted inverted 2-0 undyed Vicryl sutures and the subcutaneous layer was closed with interrupted inverted 2-0 undyed Vicryl sutures and the subcuticular layer closed with interrupted inverted 2-0 and 4-0 undyed Vicryl sutures.  The skin edges were well-approximated and we applied collodion over the closure due to her paper tape allergy and the inability to use Steri-Strips.  The patient was placed in a soft cervical collar following surgery.  She had been taken out of traction once the  graft had been placed prior to proceeding with the arthrodesis.  Following surgery, the patient is to be reversed from the anesthetic, extubated and transferred to the recovery room for further care. DD:  08/24/99 TD:  08/24/99 Job: [id number]

## 2010-12-09 NOTE — Discharge Summary (Signed)
[facility]  Patient:    ZION, LINT                MRN: [id number] Adm. Date:  [id number] Disc. Date: [id number] Attending:  Cathren Laine                           Discharge Summary  ADMISSION DIAGNOSIS:  Paroxysmal supraventricular tachycardia.  DISCHARGE DIAGNOSIS:  Paroxysmal supraventricular tachycardia, rate controlled.  CONSULTATIONS:  Dr. Jerral Bonito and Dr. Rollene Rotunda for cardiology.  PROCEDURES:  None.  HISTORY OF PRESENT ILLNESS:  The patient is a 40 year old female with a longstanding history of episodes of rapid heart rate and palpitations.  On the day of admission, she had a sensation of pressure and flutter in her chest lasting several minutes and then had a second episode which was persistent. Because of this, she came to [facility] for evaluation. The patient did not experience syncope.  She did not have significant crushing chest pain, but did have some discomfort.  Because of her symptoms, the patient was given adenosine in the emergency department with conversion of her PSVT and was started on a Cardizem drip.  She was subsequently admitted to the intensive care unit for close monitoring.  PAST MEDICAL HISTORY, FAMILY HISTORY, AND SOCIAL HISTORY:  Documented in the admitting note.  ADMITTING MEDICATIONS: 1. Klonopin 0.5 mg b.i.d. 2. Arthrotec 75 mg b.i.d. 3. Norvasc 2.5 mg q.d. (for migraine prophylaxis). 4. Depo-Provera for OCP.  PHYSICAL EXAMINATION ON ADMISSION:  VITAL SIGNS:  Admitting exam per Dr. Lovell Sheehan revealed a blood pressure of 126/96, heart rate was 80 (up to 170 on observation).  NECK:  Supple.  CHEST:  Noted to have early inspiratory wheeze which cleared.  CARDIOVASCULAR:  Had 2+ radial pulse.  She had a quiet precordium.  HOSPITAL COURSE:  The patient was admitted to a telemetry bed in the ICU.  She had cardiac enzymes drawn.  Initial CK was 138 with 0.7 MB fraction,  second CK was 109; first troponin I was 0.01, second troponin I was 0.03.  Free T4 was normal at 1.26.  TSH was normal at 1.795.  Comprehensive metabolic panel was unremarkable.  CBC with diff was unremarkable.  The patient was initially on a Cardizem drip for control.  She was seen in consultation by the cardiology service.  It was recommended she be switched to oral Cardizem which was done.  The patient was monitored overnight and continued to remain in sinus rhythm.  With her cardiac enzymes being normal, with her metabolic studies being normal, and with her heart rate being controlled, she was thought to be stable for discharge home on oral medication.  PHYSICAL EXAMINATION AT DISCHARGE:  VITAL SIGNS:  The patient is afebrile.  Blood pressure is stable at 116/60.  CARDIOVASCULAR:  Telemetry is clear with normal sinus rhythm.  Has 2+ radial pulse.  She has a quiet precordium.  Her heart is regular with normal heart sounds.  CHEST:  The patient has initial wheeze with deep inspiration which clears.  ABDOMEN:  Soft.  DISPOSITION:  The patient is to be discharged home.  DISCHARGE FOLLOWUP:  She is to be seen in follow-up by Dr. Graciela Husbands or Dr. Ladona Ridgel for further EP evaluation.  DISCHARGE MEDICATIONS: 1. The patient will continue all of her home medications. 2. Will add Cardizem CD 120 mg q.d.  SPECIAL DISCHARGE INSTRUCTIONS:  The patient is to call the office  if she has recurrent symptoms; otherwise, she will be contacted by the cardiology office with an appointment with Dr. Graciela Husbands or Ladona Ridgel.  CONDITION AT TIME OF DISCHARGE DICTATION:  Stable. DD:  09/29/00 TD:  10/01/00 Job: [id number]

## 2010-12-09 NOTE — Consult Note (Signed)
NAME:  Jo Jackson, Jo Jackson             ACCOUNT NO.:  [id number]   MEDICAL RECORD NO.:  [id number]                   PATIENT TYPE:  REC   LOCATION:  [facility]                                  FACILITY:  [facility]   PHYSICIAN:  Sondra Come, D.O.                 DATE OF BIRTH:  1970/09/03   DATE OF CONSULTATION:  05/22/2002  DATE OF DISCHARGE:                                   CONSULTATION   HISTORY OF PRESENT ILLNESS:  Jo Jackson returns to clinic today for  reevaluation. She was initially seen on 04/22/02 by Dr. Stevphen Rochester. She has  history of cervicalgia status post C6-7 fusion x3. Dr. Stevphen Rochester had prescribed  Vicodin 5 mg which she was taken up to four times per day and has used 50  pills over the last month. She has run out of medicines, and she complains  of increased pain in her left shoulder without any lasting relief following  a steroid injection given by Dr. Noel Gerold. She follows up with Dr. Noel Gerold on  05/30/02. In addition, she complains of pain in her left upper back, pointing  to her upper trapezius. Her pain is 9/10 on a subjective scale. She has  previously been followed at [facility] for acupuncture, massage,  and therapeutic exercise which she states has helped significantly. She  continues to work for [employer]. She also complains of some numbness and  paresthesias occasionally to her hands bilaterally. I reviewed the health  and history form and 14-point review of systems. In addition to the Vicodin.  She continues on Valium 5 mg, typically every evening for some anxiety and  sleep. She also takes Flexeril as needed and Advil which she states causes  her significant GI upset. She states that she will start Celebrex or Vioxx  per the orthopedist when she gets her sample pills. She has tried Ultram in  the past which she states did not help so much but has never tried Ultracet.  I reviewed Dr. Kerry Kass note, and it is clear that he and Jo Jackson  were discussing  ultimately moving away from narcotic-based pain medications  and benzodiazepines and to pursue non-narcotic alternatives.   PHYSICAL EXAMINATION:  The patient was examined in a chaperoned environment.  Physical exam reveals a healthy female in no acute distress. Blood pressure  134/66, pulse 89, respirations 16, O2 saturation 99% on room air. Range of  motion of the shoulders is decreased on the left secondary to pain. The  patient is unable to abduct or flex the shoulder past 90 degrees. When she  does this, she notes most of her pain at the anterior aspect of her left  shoulder. Palpatory examination reveals significant tenderness to palpation  over the left biceps tendon. Provocative maneuvers include mildly positive  empty can, positive Neer, and positive Hawkins' Neer. Palpatory examination  reveals an active trigger point in the left upper trapezius, reproducing  patient's upper back pain. Manual muscle testing is  5/5 bilateral upper  extremities. Sensory examination is intact to light touch upper extremities  at this time. Muscle stretch reflexes are symmetric bilateral upper  extremities.   IMPRESSION:  1. Myofascial pain syndrome.  2. Left biceps tendinitis.  3. Left rotator cuff syndrome/impingement syndrome.  4. Cervicalgia with degenerative disk disease of the cervical spine status     post C6-7 fusion.   PLAN:  1. I had a long discussion with Jo Jackson in regards to treatment     options. It is reasonable to proceed with a left upper trapezius trigger     point injection, and I discussed this with Jo Jackson, and she wishes     to proceed with this, as she has had these in the past with significant     improvement.  2. Will try to move away from narcotic based pain medication and will give     her some samples of Ultracet to take one to two up to four times per day     as needed, #20 samples pills given. She was instructed to call our office     if these are  efficacious, and we will call in a prescription.  3. Will begin Zonegran 100 mg one p.o. q.d. for neuropathic component.  4. Continue Valium just as needed but will try to wean this as well.  5. The patient to return to clinic in two weeks for reevaluation and     possible repeat trigger point injections as per patient's response and     symptoms.   PROCEDURE:  Trigger point injection, left upper trapezius. The procedure was  described to patient in detail including risks, benefits, limitations, and  alternatives. The patient wishes to proceed. Informed consent was obtained.  Skin was prepped in usual fashion with alcohol swabs. The left upper  trapezius trigger point was injected with 1 cc of 1% lidocaine plus 1 cc of  0.25% bupivacaine using a 25-gauge 1-1/2 inch needle to which responses were  noted. The patient tolerated the procedure well. There were no  complications. Discharge instructions given. 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   The patient was educated about findings and recommendations and understands.  There were no barriers to communication.                                               Sondra Come, D.O.    JJW/MEDQ  D:  05/22/2002  T:  05/23/2002  Job:  [id number]   cc:   Sharolyn Douglas, MD  [address]  [place]  [place] [address]  Fax: [phone]
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
## 2010-12-09 NOTE — Discharge Summary (Signed)
[place]  Patient:    Jo Jackson, Jo Jackson                MRN: [id number] Adm. Date:  [id number] Disc. Date: [id number] Attending:  Nathen May Dictator:   Chinita Pester, N.P. CC:         Dr. Lutricia Horsfall   Discharge Summary  DISCHARGE DIAGNOSIS:  Supraventricular tachycardia.  HISTORY OF PRESENT ILLNESS:  This is a 40 year old female who was admitted with a history of SVT.  She was admitted for SVT ablation.  HOSPITAL COURSE:  The patient underwent EP study and a slow pathway remodification for SVT.  She was observed overnight on telemetry.  She had no junctional rhythm and was discharged the following morning to home in stable condition.  DISCHARGE MEDICATIONS: 1. Klonopin 2 mg twice a day. 2. Flexeril 10 mg three times a day. 3. Zoloft 20 mg daily. 4. Hyoscyamine 0.375 twice a day as before. 5. Antibiotics prior to any dental or GYN procedures for the next three    months. 6. Enteric coated aspirin 325 mg daily x 6 weeks.  ACTIVITY:  Light activity for the next four days.  DIET:  Low fat, low cholesterol diet.  WOUND CARE:  Keep incisions clean and dry.  SPECIAL INSTRUCTIONS:  She is allowed to shower.  FOLLOWUP:  She is to see the P.A. at [facility] on May 3, at 9 a.m.  She was to follow up with Dr. Graciela Husbands p.r.n. DD:  11/09/00 TD:  11/10/00 Job: [id number]

## 2010-12-09 NOTE — Op Note (Signed)
[place]. [facility]  Patient:    Jo Jackson, Jo Jackson                MRN: [id number] Proc. Date: 11/08/00 Adm. Date:  [id number] Disc. Date: [id number] Attending:  Nathen May CC:         Angelena Sole, M.D. [facility]   Operative Report  PREOPERATIVE DIAGNOSIS:  Supraventricular tachycardia.  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  PROCEDURES:  Invasive electrophysiological study and radiofrequency catheter ablation.  SURGEON:  Nathen May, M.D. [facility]  CONSENT:  Following the obtainment of informed consent the patient was brought to the electrophysiology laboratory and placed on the fluoroscopic table in the supine position.  The patient was administered general anesthesia because of prior back surgery under the care of Dr. Diamantina Monks.  A pregnancy test was not done initially and this was obtained subsequently resulting in some significant delay in the procedure.  This was negative.  Thereafter cardiac catheterization was performed under general anesthesia after routine prep and drape.  Following the procedure the catheter was removed, hemostasis was obtained and the patient was transferred to the holding area in stable condition.  Catheters:  A 5 French quadripolar catheter was inserted via the left femoral vein to the AV junction.  A 5 French quadripolar catheter was inserted via the left femoral vein to the high right atrium.  A 5 French quadripolar catheter was inserted via the left femoral vein to the right ventricular apex.  An 6 [place] octapolar catheter was inserted via the right femoral vein to the coronary sinus.  A 7 French 4 mm deflectable tip catheter was inserted via the right femoral vein (SR0 sheath) to mapping sites in the posterior septal space.  Surface leads I, aVF and V1 were monitored continuously throughout the procedure.  Following insertion of the catheter the stimulation  protocol included incremental atrial pacing, incremental ventricular pacing, single atrial extra stimuli at a pace cycle length of 700 msec and 600 msec.  Results:  Surface electrocardiogram. Rhythm:  Initial is sinus.  Final is sinus. Cycle length:  Initial is 899 msec.  Final is 879 msec. P-R interval: Initial is 167 msec.  Final is 163 msec. QRS duration: Initial is 104 msec.  Final is 100 msec. Q-T interval:  Initial is 422 msec.  Final is 428 msec. P-wave duration:  Initial is 118 msec.  Final is 104 msec. Bundle branch block:  Initial is absent.  Final is absent. Preexcitation:  Initial is absent.  Final is absent.  AV nodal function: A-H interval:  Initial is 75 msec.  Final is 61 msec. (Normal is 60-125 msec). AV Wenckebach:  Greater than 500 msec initial which was associated with ______ tachycardia and Final was 540 msec.  VA Wenckebach:  Post procedure occurred at 350 msec and 400 msec.  AV nodal effector refractor:  Following ablation of the accessory pathways had a pace cycle length of 520 msec and a fast pathway, and less than or 440 msec in a slow pathway which is consistent with tachycardia.  Post ablation had a pace cycle length of 700 msec, the fast pathway ERP was 520 msec and the slow pathway ERP was 360 msec.  AV conduction was discontinuous of echo beats and tachycardia preablation and discontinuous with echo beats post ablation.  His-Purkinje system function: HV interval: Initial is 51 msec.  Final is 44 msec.  (Normal 35-65 msec). His bundle duration:  Initial  is 17 msec.  Final is 19 msec. (Normal 10-25).  Accessory pathway function:  No evidence of an accessory pathway was identified.  Arrhythmia induced AV nodal reentry tachycardia was reproducibly induced both spontaneously and with atrial pacing both at 500 msec as well a single atrial extra stimuli at 600:  460-400 and 700:  480-420.  The tachycardia cycle length ranged from 372-403 msec and  terminated spontaneously over the atrial overdriving pacing.  During a typical episode of tachycardia the A-H interval was 363 msec.  The H-A interval was 300 msec.  The V-A time was -12 msec.  The earliest atrial activation was in the His-bundle electrogram.  ______ energy.  A total of 42 seconds of radiofrequency energy was applied in two applications at the level of the CS-os where a multicomponent atrial electrogram was identified.  Two contiguous lesions were placed with energy terminated because of displacement of the catheter to the coronary sinus. Junctional tachycardia was seen at all three locations with application of energy.  Following RF applications, tachycardia a was no longer inducible although the patient continued to have antegrade flow pathway function.  Fluoroscopy time was less than 5 minutes at 15 frames per second.  IMPRESSION: 1. Normal sinus function with post procedural junctional rhythm. 2. Normal atrial function. 3. Dual antegrade AV nodal physiology with inducible slow - fast (typical) AV    nodal reentry tachycardia.  Radiofrequency energy applied to region of the    slow pathway resulted in its modification rendering the tachycardia not    inducible. 4. Normal His-Purkinje system function. 5. No accessory pathway. 6. Normal ventricular response to program stimulation.  SUMMARY:  The results of the electrophysiological testing identified slow-fast AV nodal reentry tachycardia as a mechanism underlying [name]. Slow pathway modification resulted in the tachycardia being rendered not inducible.  The patient also had post procedural junctional rhythm.  This is something that I have seen, unfortunately, not infrequently in young women. This results presumably from autonomic ______ and typically resolves in the short term following the procedure.  PLAN:  The patient will be observed overnight.  In the event that the patient  has persistent  problems with junctional rhythm low dose theophylline would be considered for a short period of time.  Aspirin daily for 6 weeks and encarditis prophylaxis for 3 months. DD:  11/08/00 TD:  11/09/00 Job: [id number]

## 2010-12-09 NOTE — Op Note (Signed)
[facility]  Patient:    Jo Jackson, Jo Jackson Visit Number: [id number] MRN: [id number]          Service Type: SUR Location: 4W 0471 01 Attending Physician:  Patricia Nettle Proc. Date: 05/09/01 Admit Date:  05/09/2001                             Operative Report  DATE OF BIRTH:  [birth year]  DIAGNOSES: 1. 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 2. Broken screw C6.  PROCEDURE: 1. Application and removal of Mayfield tongs. 2. Posterior cervical fusion C6-7. 3. Bilateral lateral mass, instrumentation C6-7. 4. Spinous process wiring C6-7. 5. Left posterior iliac crest bone graft harvest.  SURGEON:  Patricia Nettle, M.D.  ASSISTANTS: 1. Alexzandrew L. Perkins, P.A.-C. 2. Ottie Glazier. Wynona Neat, P.A.-C.  ESTIMATED BLOOD LOSS:  200 cc.  INSTRUMENTATION: 1. AcroMed Summit lateral mass screws and rods. 2. AcroMed Songer titanium cable.  COMPLICATIONS:  None.  INDICATION:  The patient is a 40 year old female, who is status post anterior cervical diskectomy and fusion in January 2001, by Dr. Newell Coral.  She collapsed her graft and developed a pseudoarthrosis.  She was taken back to the operating room by Dr. Gerlene Fee on April 26, 2000, for a revision anterior fusion with autograft and anterior cervical plate.  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  CT myelogram documented reabsorption of the graft and the broken screw in C6.  There is some right foraminal narrowing secondary to an uncovertebral spur, but this was not thought to be significant.  MRI scan was also obtained and did not show any significant neural impingement.  She has stabbing lower neck and right scapular pain and now presents for posterior cervical fusion to address her anterior pseudoarthrosis.  DESCRIPTION OF PROCEDURE:  The patient was properly identified in the holding area as Jo Jackson and taken to the operating  room.  She underwent general endotracheal anesthesia without difficulty.  Neural monitoring was established both SSEPs and upper extremity EMGs.  A Mayfield head holder was applied in the standard fashion.  The patient was flipped prone onto the operating room table using the AcroMed 4 poster frame and the Mayfield attachment.  The neck was positioned anatomically.  Neuro monitoring was maintained throughout positioning and did not change.  An x-ray was taken to confirm acceptable alignment of the cervical spine.  The neck and left posterior iliac crest were prepped and draped in the usual sterile fashion.  A 5 cm incision was made over the C6-7 level.  Dissection was carried down through the nuchal ligament to the spinous processes.  The paraspinal musculature was dissected in a subperiosteal fashion out to the lateral aspect of the lateral masses.  Care was taken not to violate the joint at C5-6 or C7-T1.  Care was also taken to preserve the inner spinous ligaments, both above and below the fusion.  Intraoperative x-ray was taken and confirmed the C6-7 level.  There was gross motion at the facets.  Lateral mass screws were placed using a Magerl technique at C6 and C7 by first creating a dimple with the 2 mm bur followed by a tap drill technique and then depth gauge.  Then 12 mm screws were placed.  The screws were stimulated and EMGs were checked. EMG findings were in acceptable limits.  At this point, attention was directed to the left posterior iliac crest  bone graft.  A 5 cm incision was made over the posterosuperior iliac spine.  Dissection was carried down to the fascia. The fascia was excised and the posterosuperior spine was exposed.  A Leksell rongeur was utilized to remove the cap.  Curettes were used to remove bones from between the tables.  Gelfoam was used to control bleeding.  Acceptable hemostasis was achieved.  The wound was irrigated and closed with #1 Vicryl sutures  followed by 2-0 Vicryls and a 3-0 Monocryl on the skin.  Attention was directed back to the neck.  The C6-7 facets were decorticated using a bur. The lateral masses and lamina were also debrided using the bur.  Bone graft was packed into the facet joints and onto the lamina and lateral masses. Three millimeter rods were placed into the Summit polyaxial screw posts.  The caps were applied and tensioned in the standard fashion.  Attention was then directed to the spinous processes.  A hole was created at the spinolaminar junction at both the superior and inferior spinous process.  A Songer cable was passed through the holes and configured into a figure-of-eight fashion around the C7 spinous process.  The wire was tensioned to 10 pounds.  A Hemovac drain was placed deep.  The fascia and the nuchal ligament were closed using #1 interrupted figure-of-eight sutures.  A 2-0 Vicryl suture was utilized on the subcutaneous tissue.  A 3-0 Monocryl was used to approximate the skin edges.  Benzoin and Steri-Strips were applied.  It should be noted that there was a decrease in the SSEPs on the left upper extremity around the time that the rods were being placed.  There were no changes in the lower extremities.  The situation was evaluated, and it was not thought that the instrumentation was contributing to this.  The decision was made to close the wound and examine the patient clinically after extubation.  It should also be noted that gentle compression was placed across the rods before tightening. The patient was flipped supine.  Care was taken to control her neck at all times.  The Mayfield tongs were removed, and the pin sites were dry.  An Aspen collar that had been prefitted was placed on the patients neck.  Before leaving the operating room, she was moving her upper and lower extremities. She had good strength.  She was transferred to the recovery room in stable condition. Attending Physician:   Patricia Nettle. DD:  05/09/01 TD:  05/10/01 Job: [id number]

## 2010-12-09 NOTE — Discharge Summary (Signed)
[facility]  Patient:    Jo Jackson, Jo Jackson Visit Number: [id number] MRN: [id number]          Service Type: SUR Location: 4W 0471 01 Attending Physician:  Patricia Nettle Dictated by:   Sammuel Cooper Mahar, P.A.-C. Admit Date:  05/09/2001 Discharge Date: 05/13/2001                             Discharge Summary  DATE OF BIRTH:  [birth year]  ADMITTING DIAGNOSES: 1. Pseudoarthrosis of C6-7 and broken screw, C6. 2. Gastroesophageal reflux disease. 3. Occasional arrhythmia. 4. Fibromyalgia. 5. Allergic to paper tape and HypoFix.  DISCHARGE DIAGNOSES: 1. Status post posterior cervical fusion, C6-7, bilateral mass    instrumentation, C6-7, spinous process wiring C6-7, and left posterior    iliac crest bone graft harvest. 2. Postoperative hemorrhagic anemia. 3. Migraine headache, resolved post Imitrex treatment. 4. Gastroesophageal reflux disease. 5. Occasional arrhythmia. 6. Fibromyalgia. 7. Allergic to paper tape and HypoFix.  PROCEDURE: 1. Application and removal of Mayfield tongs. 2. Posterior cervical fusion, C6-7. 3. Bilateral lateral mass, instrumentation C6-7. 4. Spinous process wiring, C6-7. 5. Posterior iliac crest bone graft harvest.  This was done by Dr. Sharolyn Douglas with the assistance of Alexzandrew Julien Girt, P.A.-C., and Karie Chimera, P.A.-C.  Anesthesia used was general.  CONSULTS:  Psychiatry.  HISTORY OF PRESENT ILLNESS:  Patient is a 40 year old female who was referred to Dr. Noel Gerold for cervical pain with radiation into the right scapula. Fortunately, she has developed no weakness or numbness in the upper extremities.  She had previous cervical surgery at another practice in the past.  Unfortunately, she has gone on to a pseudoarthrosis of C6-7.  A CT showed fracture of the right C6 screw.  Reconstruction showed evidence of a bony union between the interbody graft and the end plates at the level of C6-7.  This patient  has consistent pain and discomfort and it was felt that she would benefit from surgical intervention.  LABORATORY DATA:  On May 03, 2001, CBC was within normal limits, PT-INR and PTT all within normal limits.  Basic metabolic panel was within normal limits with the exception of a sodium elevated at 147.  Blood type is B, Rh type positive, antibody screen negative.  EKG on May 03, 2001, revealed normal sinus rhythm, no significant change since Dec 11, 2000, read by Peter Swazil[place], M.D.  X-rays on May 09, 2001, of the cervical spine intraoperatively used to locate the C6 spinous process.  On May 11, 2001, views of the cervical spine revealed status post anterior and posterior fusion of C6-7, fractures of the anterior screw of C6.  AP and lateral views were performed of the cervical spine showing patient to be status post anterior and posterior fusion of C6-7.  There are fractures of the anterior screw at C6. Alignment is within normal limits.  There is focal kyphosis centered on C6 but no evidence for anterior listhesis.  HOSPITAL COURSE:  On May 09, 2001, patient was taken to the operating room to undergo the above-listed procedure.  She tolerated the procedure very well without any complications.  One Hemovac drain was placed intraoperatively and she was transferred to the recovery room in stable condition. Postoperatively, she was placed on Ancef 1 g q.8h. x 6 doses and tolerated that without any difficulty.  Initially, she was placed on PCA morphine to control her pain.  She was transitioned over to p.o.  analgesics throughout her hospital stay.  Her diet was advanced as tolerated and she had no difficulty with that.  Neurovascular checks were done throughout her hospital stay and remained intact.  Physical therapy was consulted to work with the patient postoperatively to get her independently ambulating in a room.  This was deferred secondary to the patient having a  migraine.  However, she was moving around independently about her room getting to the bed, sit up from the bed to the bathroom without any difficulty.  Patients migraine was treated with Imitrex and did resolve.  On October 20, the patient made a comment that she "would rather be dead."  As a result of this, psychiatry was consulted to assess her for any suicidal ideations and they determined that she was not actually suicidal, that she was just very frustrated secondary to her continued pain secondary to her cervical issues.  They were not worried about her beyond that.  Patients operative dressing was taken down.  On postoperative day #2, her incision looked good without any signs or symptoms of infection or any active bleeding.  Patients pain control was difficult. She initially was on a PCA morphine.  This was changed over to PCA Dilaudid because the morphine was not adequately controlling her pain.  She was also on Percocet one to two tablets q.4-6h. for pain and Robaxin 500 q.6-8h. p.r.n. for spasm.  Valium was added for pain and spasm control.  Patient was weaned off of her PCA and put on p.o. Percocet in an attempt to control her pain. However, this was not working.  Vicodin was tried.  This also did not work. Prior to discharge, we did get her pain controlled on OxyContin 20 mg b.i.d., in combination with Percocet for breakthrough pain, and Valium for spasm.  On May 13, 2001, the patient was doing well.  She was still having some bit of pain.  However, being discharged on OxyContin, Percocet, and Valium, pain control should remain adequate upon discharge.  Patient was neurovascularly intact.  Motor and sensory were grossly intact without any significant changes.  Patient was orthopedically stable and medically stable, and ready for discharge to her home on October 21.  Diagnosis is status post the above- listed procedure, being discharged to her home.  ACTIVITY:  No driving  until her follow-up appointment at least.  She will remain in her Aspen collar at all times.  She may shower one week after  surgery.  Dressing changes should be done daily and supplies were given for this, and instructions were also given for this.  She should avoid any lifting, or pushing, or pulling, or overhead activities with her upper extremities.  DIET:  Resume preoperative diet as tolerated.  FOLLOW-UP:  She should follow up with Dr. Noel Gerold two weeks postoperatively and call for an appointment.  MEDICATIONS ON DISCHARGE: 1. Valium 5 mg, #30. 2. Percocet 5/325, #50. 3. OxyContin 20 mg, #30.  All with no refills.  DISPOSITION:  Patient is being discharged to her home.  CONDITION ON DISCHARGE:  Stable. Dictated by:   Sammuel Cooper. Mahar, P.A.-C. Attending Physician:  Patricia Nettle. DD:  05/22/01 TD:  05/22/01 Job: [id number]

## 2010-12-09 NOTE — Op Note (Signed)
[place]. [facility]  Patient:    Jo Jackson, Jo Jackson                MRN: [id number] Proc. Date: 04/26/00 Adm. 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  PROCEDURE: 1. Removal of C6-7 anterior cervical plate, exploration of C6-7    pseudoarthrosis, anterior cervical fusion with harvest of iliac crest    autograft and placement of Atlantis plate. 2. microdissection of C6-7 interspace.  SURGEON:  Reinaldo Meeker, M.D.  ASSISTANT:  Donzetta Sprung. Roney Jaffe., MD  PROCEDURE IN DETAIL:  After being placed in the supine and 10 pounds of Holter traction the patients neck was prepped and draped in the usual sterile fashion.  Localization x-ray was taken prior to incision to identify the appropriate level.  A transverse incision was made in the right anterior neck, starting in the midline and headed towards the medial aspect of the sternocleidomastoid muscle.  The platysma muscle was then incised transversely.  The natural fascial plane between the strap muscles medially, sternocleidomastoid laterally was identified and followed down to the anterior aspect of the cervical spine.  Midline adhesions were identified, dissected free with a Bovie current and the previous anterior cervical plate was identified.  This was an A-line plate and the interlocking screws were then removed followed by the ______ screws without difficulty and the plate was then removed.  The area of the previous bone graft attempt was easily identified and this was drilled down to a complete depth down to the epidural tissue.  The vertebral bodies were drilled back and decorticated until bleeding bone was encountered.  Prior to this an incision had been made over the right anterior iliac crest and carried to the bone which was then dissected free of  soft tissue.  Using straight and curved osteotome a generous iliac crest bone graft had been obtained followed by irrigation of the area and closure with interrupted Vicryl on the deep layers and staples on the skin.  At this point measurements were taken and the iliac crest graft was fashioned to the appropriate size and shape.  After irrigating and confirming hemostasis the ______ was then packed without difficulty.  An appropriate length Atlantis plate was then chosen and under fluoroscopic guidance power holes were drilled, tapped, and screws placed without difficulty.  Then 13 mm variable screws were used.  The locking screws were then tightened.  Final fluoroscopy showed the plates to be in excellent position.  At this point large amounts of irrigation were carried out and any bleeding controlled with bipolar coagulation.  The wound was then closed using interrupted Vicryl in the platysma muscle and inverted 5-0 PDS on the subcuticular layer and staples on the skin.  A sterile dressing was then applied along with Aspen collar.  The patient was extubated and taken to recovery in stable condition. DD:  04/26/00 TD:  04/27/00 Job: [id number]

## 2010-12-09 NOTE — H&P (Signed)
[facility]  Patient:    Jo Jackson, Jo Jackson. Visit Number: [id number] MRN: [id number]          Service Type: Attending:  Patricia Nettle, M.D. Dictated by:   Druscilla Brownie. Shela Nevin, P.A. Adm. Date:  05/09/01   CC:         Angelena Sole, M.D. [facility]   History and Physical  DATE OF BIRTH:  1971/04/13  CHIEF COMPLAINT:  Pain in her cervical spine as well as radiation into the right scapular region.  HISTORY OF PRESENT ILLNESS:  This 40 year old white female referred to Dr. Noel Gerold for cervical pain with radiation to the right scapula.  Fortunately, she has developed no weakness or numbness in the upper extremities.  She has had previous cervical surgery at another practice in the past.  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  The CT scan showed fracture of the right C6 screw.  _________ reconstruction showed an evidence of bony union between the interbody graft and the end plates of that level 6-7.  This patient has consistent pain and discomfort.  It is felt she would benefit from surgical intervention, particularly in light of the failed screw at the C6 level.  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  She is left with an intermittent arrhythmia.  She also has GERD, fibromyalgia.  PAST SURGICAL HISTORY:  Past surgeries include bilateral carpal tunnel release, left knee scope, appendectomy.  She has had cervical surgeries x 2.  CURRENT MEDICATIONS: 1. Protonix 40 mg one q.d. 2. Vicodin 7.5 mg p.r.n. pain. 3. Flexeril 10 mg p.r.n. pain. 4. Klonopin 1 mg b.i.d.  FAMILY PHYSICIAN:  Angelena Sole, M.D.  GASTROENTEROLOGIST:  Venita Lick. Pleas Koch., M.D.  CARDIOLOGIST:  Nathen May, M.D.  ALLERGIES:  She is allergic to TORADOL, LATEX, PAPER TAPE, and HYPOFIX TAPE. Micropore tape is okay.  SOCIAL HISTORY:  The patient is married, smokes about a pack of cigarettes  per day, has no intake of ETOH.  FAMILY HISTORY:  Noncontributory.  REVIEW OF SYSTEMS:  CNS:  No seizure disorder, paralysis, numbness, or double vision.  The patient does have radicular pain into the right scapular region from the cervical spine.  CARDIOVASCULAR:  No chest pain, no angina, no orthopnea.  RESPIRATORY:  No productive cough, no hemoptysis, no hemoptysis, no shortness of breath.  GASTROINTESTINAL:  No nausea, vomiting, melena, or bloody stool.  She does well with her Protonix.  She also has obstipation from time to time.  GENITOURINARY:  No dysuria or hematuria.  MUSCULOSKELETAL: Primarily in present illness with her neck and shoulder.  PHYSICAL EXAMINATION:  GENERAL:  Alert and cooperative, friendly, somewhat apprehensive, 40 year old white female who is accompanied by husband.  VITAL SIGNS:  Blood pressure 140/88, pulse 80 and regular, respirations 12.  HEENT:  Normocephalic, PERRLA, EOM intact.  Oropharynx clear.  CHEST:  Clear to auscultation.  No rhonchi, no rales.  HEART:  Regular rate and rhythm.  No murmurs were heard.  ABDOMEN:  Soft, nontender.  Liver and spleen not felt.  GENITALIA/RECTAL/PELVIC/BREASTS:  Not done, not pertinent to present illness.  EXTREMITIES:  The patient has pain with range of motion of the cervical spine, particularly on extension.  NEUROLOGIC:  No motor weakness of the upper extremities.  Sensation is grossly intact and grip is good.  ADMITTING DIAGNOSES: 1. Pseudoarthrosis, C6-7. 2. Gastroesophageal reflux disease. 3. Occasional arrhythmia. 4.  Fibromyalgia. 5. Allergic to paper tape and Hypofix.  PLAN:  The patient will undergo posterior spinal fusion at C6-7 with C6 lateral mass screws and C7 pedicle screws.  She will have a posterior iliac bone graft and spinous process wiring. Dictated by:   Druscilla Brownie. Shela Nevin, P.A. Attending:  Patricia Nettle, M.D. DD:  04/24/01 TD:  04/24/01 Job: [id number]

## 2010-12-12 ENCOUNTER — Other Ambulatory Visit: Payer: Self-pay | Admitting: *Deleted

## 2010-12-12 MED ORDER — AMPHETAMINE-DEXTROAMPHETAMINE 20 MG PO TABS: 20.0000 mg | ORAL_TABLET | Freq: Two times a day (BID) | ORAL | Status: DC

## 2010-12-12 NOTE — Telephone Encounter (Signed)
Pt aware, Rx ready for pick up 

## 2011-01-16 ENCOUNTER — Other Ambulatory Visit: Payer: Self-pay | Admitting: *Deleted

## 2011-01-16 MED ORDER — AMPHETAMINE-DEXTROAMPHETAMINE 20 MG PO TABS: 20.0000 mg | ORAL_TABLET | Freq: Two times a day (BID) | ORAL | Status: DC

## 2011-01-16 NOTE — Telephone Encounter (Signed)
Pt aware Rx will be ready for pick up tomorrow.

## 2011-02-27 ENCOUNTER — Other Ambulatory Visit: Payer: Self-pay | Admitting: Family Medicine

## 2011-02-28 MED ORDER — AMPHETAMINE-DEXTROAMPHETAMINE 20 MG PO TABS: 20.0000 mg | ORAL_TABLET | Freq: Two times a day (BID) | ORAL | Status: DC

## 2011-02-28 NOTE — Telephone Encounter (Signed)
VM left advising pt Rx ready for pick up    KP

## 2011-03-02 ENCOUNTER — Other Ambulatory Visit: Payer: Self-pay | Admitting: Family Medicine

## 2011-03-02 ENCOUNTER — Ambulatory Visit (INDEPENDENT_AMBULATORY_CARE_PROVIDER_SITE_OTHER): Payer: BC Managed Care – PPO | Admitting: Family Medicine

## 2011-03-02 ENCOUNTER — Encounter: Payer: Self-pay | Admitting: Family Medicine

## 2011-03-02 VITALS — BP 110/78 | HR 73 | Temp 98.1°F | Wt 267.0 lb

## 2011-03-02 DIAGNOSIS — F988 Other specified behavioral and emotional disorders with onset usually occurring in childhood and adolescence: Secondary | ICD-10-CM

## 2011-03-02 MED ORDER — AMPHETAMINE-DEXTROAMPHETAMINE 30 MG PO TABS: 30.0000 mg | ORAL_TABLET | Freq: Two times a day (BID) | ORAL | Status: DC

## 2011-03-02 MED ORDER — AMPHETAMINE-DEXTROAMPHET ER 30 MG PO CP24: 30.0000 mg | ORAL_CAPSULE | ORAL | Status: DC

## 2011-03-02 NOTE — Telephone Encounter (Signed)
Please advise 

## 2011-03-02 NOTE — Telephone Encounter (Signed)
Discuss with patient  

## 2011-03-02 NOTE — Assessment & Plan Note (Signed)
adderall 30 mg bid  rto 6 months or sooner prn

## 2011-03-02 NOTE — Telephone Encounter (Signed)
Patient  Received new rx for adderall today - written for 30 xr --- patient doesn't want xr - dr Laury Axon increased dose from 20mg   To 30mg  - patient wants adderall 30mg  twice a day - patient will return rx when she picks up new one --- please place note on new rx to remind patient to return rx

## 2011-03-02 NOTE — Telephone Encounter (Signed)
My mistake ---- it is supposed to be 30 mg bid #60

## 2011-03-02 NOTE — Telephone Encounter (Signed)
Addended by: Candie Echevaria L on: 03/02/2011 05:05 PM   Modules accepted: Orders

## 2011-03-02 NOTE — Patient Instructions (Signed)
Attention Deficit-Hyperactivity Disorder ADHD Attention deficit-hyperactivity disorder (ADHD) is a problem with behavior issues based on the way the brain functions (neurobehavioral disorder). It is a common reason for behavior and academic problems in school. CAUSES The cause of ADHD is unknown in most cases. It may run in families. It sometimes can be associated with learning disabilities and other behavioral problems. SYMPTOMS There are three types of ADHD. Some of the symptoms include:  Inattentive   Gets bored or distracted easily   Loses or forgets things. Forgets to hand in homework.   Has trouble organizing or completing tasks.   Difficulty staying on task.   An inability to organize daily tasks and school work.   Leaving projects, chores and homework unfinished.   Trouble paying attention or responding to details. Careless mistakes.   Difficulty following directions. Often seems like is not listening.   Dislikes activities that require sustained attention (like chores or homework).   Hyperactive-impulsive   Feels like it is impossible to sit still or stay in a seat. Fidgeting with hands and feet.   Trouble waiting turn.   Talking too much or out of turn. Interruptive.   Speaks or acts impulsively   Aggressive, disruptive behavior   Constantly busy or on the go, noisy.   Combined   Has symptoms of both of the above.  Often children with ADHD feel discouraged about themselves and with school. They often perform well below their abilities in school. These symptoms can cause problems in home, school, and in relationships with peers. As children get older, the excess motor activities can calm down, but the problems with paying attention and staying organized persist. Most children do not outgrow ADHD but with good treatment can learn to cope with the symptoms. DIAGNOSIS When ADHD is suspected, the diagnosis should be made by professionals trained in ADHD.    Diagnosis will include:  Ruling out other reasons for the child's behavior.   The caregivers will check with the child's school and check their medical records.   They will talk to teachers and parents.   Behavior rating scales for the child will be filled out by those dealing with the child on a daily basis.  A diagnosis is made only after all information has been considered. TREATMENT Treatment usually includes behavioral treatment often along with medicines. It may include stimulant medicines. The stimulant medicines decrease impulsivity and hyperactivity and increase attention. Other medicines used include antidepressants and certain blood pressure medicines. Most experts agree that treatment for ADHD should address all aspects of the child's functioning. Treatment should not be limited to the use of medicines alone. Treatment should include structured classroom management. The parents must receive education to address rewarding good behavior, discipline and limit-setting. Tutoring and/or behavioral therapy should be available for the child. If untreated, the disorder can have long term serious effects into adolescence and adulthood. HOMECARE INSTRUCTIONS   Often with ADHD there is a lot of frustration among the family in dealing with the illness. There is often blame and anger that is not warranted. This is a life long illness. There is no way to prevent ADHD. In many cases, because the problem affects the family as a whole, the entire family may need help. A therapist can help the family find better ways to handle the disruptive behaviors and promote change. If the child is young, most of the therapist's work is with the parents. Parents will learn techniques for coping with and improving their child's behavior.  Sometimes only the child with the ADHD needs counseling. Your caregivers can help you make these decisions.   Children with ADHD may need help in organizing. Here are some helpful  tips:   Keep routines the same every day from wake-up time to bedtime. Schedule everything. This includes homework and playtime. This should include outdoor and indoor recreation. Keep the schedule on the refrigerator or a bulletin board where it is frequently seen. Mark schedule changes as far in advance as possible.   Have a place for everything and keep everything in its place. This includes clothing, backpacks, and school supplies.   Encourage writing down assignments and bringing home needed books.   Offer your child a well-balanced diet. Breakfast is especially important for school performance. Children should avoid drinks with caffeine including:   Soft drinks.   Coffee.   Tea.   However, some older children (adolescents) may find these drinks helpful in improving their attention.   Children with ADHD need consistent rules that they can understand and follow. If rules are followed, give small rewards. Children with ADHD often receive, and expect, criticism. Look for good behavior and praise it. Set realistic goals. Give clear instructions. Look for activities that can foster success and self-esteem. Make time for pleasant activities with your child. Give lots of affection.   Parents are their children's greatest advocates. Learn as much as possible about ADHD. This helps you become a stronger and better advocate for your child. It also helps you educate your child's teachers and instructors if they feel inadequate in these areas. Parent support groups are often helpful. A national group with local chapters is called CHADD (Children and Adults with Attention Deficit/Hyperactivity Disorder).  PROGNOSIS  There is no cure for ADHD. Children with the disorder seldom outgrow it. Many find adaptive ways to accommodate the ADHD as they mature. SEEK MEDICAL CARE IF YOUR CHILD HAS:  Repeated muscle twitches, cough or speech outbursts.   Sleep problems.   Marked loss of appetite.    Depression.   New or worsening behavioral problems.   Dizziness.   Racing heart.   Stomach pains.   Headaches.  Document Released: 06/30/2002 Document Re-Released: 04/18/2008 [facility] Patient Information 2011 [place], [place].

## 2011-03-02 NOTE — Progress Notes (Signed)
  Subjective:    Patient ID: Jo Jackson, female    DOB: [birth year], 40 y.o.   MRN: [id number]  HPI  Pt here f/u ADD.  Pt overall doing well but feels like med needs to be increased.  No problems with med.  Review of Systems As above    Objective:   Physical Exam  Constitutional: She is oriented to person, place, and time. She appears well-developed and well-nourished.  Cardiovascular: Normal rate, regular rhythm and normal heart sounds.   No murmur heard. Pulmonary/Chest: Effort normal. No respiratory distress. She has no wheezes. She has no rales.  Neurological: She is alert and oriented to person, place, and time.  Psychiatric: She has a normal mood and affect. Her behavior is normal.          Assessment & Plan:

## 2011-03-28 ENCOUNTER — Telehealth: Payer: Self-pay

## 2011-03-28 NOTE — Telephone Encounter (Signed)
She has been on it before----leave 1 sample 30 mg and rx 60 mg to start after 1 week-------  #30  1po qd ,  5 refills ---ov 4-6 weeks

## 2011-03-28 NOTE — Telephone Encounter (Signed)
Pt left message requesting Rx for cymbalta 30 mg to start and would like to be increased to 60 mg. She states that she has been severely depressed

## 2011-03-29 NOTE — Telephone Encounter (Signed)
msgs left on VM     KP

## 2011-04-03 NOTE — Telephone Encounter (Signed)
mssg left on VM to call the office    KP

## 2011-04-05 NOTE — Telephone Encounter (Signed)
mssg left to call the office    KP 

## 2011-04-06 NOTE — Telephone Encounter (Signed)
Letter mailed after several attempts to contact patient    KP

## 2011-05-22 ENCOUNTER — Telehealth: Payer: Self-pay

## 2011-05-22 NOTE — Telephone Encounter (Signed)
msg from patient stating she is on Amox, Claritin D and Prednisone for an ear infection and she got a bruise on the inside of her ankle and having swelling in her ankles. I tried calling patient back to triage but there was no answer. Msg left to call the office     KP

## 2011-05-23 NOTE — Telephone Encounter (Signed)
msg left to call the office     KP 

## 2011-05-31 NOTE — Telephone Encounter (Signed)
msg left for a return call.      KP 

## 2011-06-07 NOTE — Telephone Encounter (Signed)
Letter mailed     KP 

## 2011-08-22 ENCOUNTER — Other Ambulatory Visit: Payer: Self-pay | Admitting: *Deleted

## 2011-08-22 MED ORDER — AMPHETAMINE-DEXTROAMPHETAMINE 30 MG PO TABS: 30.0000 mg | ORAL_TABLET | Freq: Two times a day (BID) | ORAL | Status: DC

## 2011-08-22 NOTE — Telephone Encounter (Signed)
Pt called requesting 1 month supply for adderall. Pt states that she tried the mail order but it did not work well would prefer to go back to local pharmacy.Pt aware Rx read for pick=up.

## 2011-09-12 ENCOUNTER — Telehealth: Payer: Self-pay | Admitting: *Deleted

## 2011-09-12 NOTE — Telephone Encounter (Signed)
Prior Auth approved from 08-17-11 until 09-06-12, approval letter scan to chart.

## 2011-10-17 ENCOUNTER — Other Ambulatory Visit: Payer: Self-pay | Admitting: *Deleted

## 2011-10-17 MED ORDER — AMPHETAMINE-DEXTROAMPHETAMINE 30 MG PO TABS: 30.0000 mg | ORAL_TABLET | Freq: Two times a day (BID) | ORAL | Status: DC

## 2011-10-17 NOTE — Telephone Encounter (Signed)
Pt aware Rx ready for pick up 

## 2011-10-23 ENCOUNTER — Ambulatory Visit (INDEPENDENT_AMBULATORY_CARE_PROVIDER_SITE_OTHER): Payer: BC Managed Care – PPO | Admitting: Family Medicine

## 2011-10-23 ENCOUNTER — Encounter: Payer: Self-pay | Admitting: Family Medicine

## 2011-10-23 VITALS — BP 114/72 | HR 79 | Temp 97.8°F | Wt 225.0 lb

## 2011-10-23 DIAGNOSIS — F3289 Other specified depressive episodes: Secondary | ICD-10-CM

## 2011-10-23 DIAGNOSIS — F988 Other specified behavioral and emotional disorders with onset usually occurring in childhood and adolescence: Secondary | ICD-10-CM

## 2011-10-23 DIAGNOSIS — F32A Depression, unspecified: Secondary | ICD-10-CM

## 2011-10-23 DIAGNOSIS — F329 Major depressive disorder, single episode, unspecified: Secondary | ICD-10-CM

## 2011-10-23 DIAGNOSIS — F341 Dysthymic disorder: Secondary | ICD-10-CM

## 2011-10-23 MED ORDER — VENLAFAXINE HCL ER 75 MG PO CP24: 75.0000 mg | ORAL_CAPSULE | Freq: Every day | ORAL | Status: DC

## 2011-10-23 MED ORDER — VENLAFAXINE HCL ER 37.5 MG PO CP24: ORAL_CAPSULE | ORAL | Status: DC

## 2011-10-23 NOTE — Assessment & Plan Note (Signed)
Cont meds rto 6 months 

## 2011-10-23 NOTE — Progress Notes (Signed)
  Subjective:    Patient ID: Jo Jackson, female    DOB: 1970/10/19, 41 y.o.   MRN: [id number]  HPI Pt here to f/u add and c/o anger problems.  She states she has no tolerance for anything---she flies of the handle easily.   She has been on several antidepressants in past.   Cymbalta worked well but was too expensive.     Review of Systems as above   Objective:   Physical Exam  Constitutional: She is oriented to person, place, and time. She appears well-developed and well-nourished.  Cardiovascular: Normal rate and regular rhythm.   No murmur heard. Pulmonary/Chest: Effort normal and breath sounds normal.  Neurological: She is alert and oriented to person, place, and time.  Psychiatric: She has a normal mood and affect. Her behavior is normal. Thought content normal.          Assessment & Plan:

## 2011-10-23 NOTE — Assessment & Plan Note (Signed)
effexor 37.5  1 po qd for 7 days then increase to 2 a day rto 1 month

## 2011-10-23 NOTE — Patient Instructions (Signed)
Attention Deficit Hyperactivity Disorder Attention deficit hyperactivity disorder (ADHD) is a problem with behavior issues based on the way the brain functions (neurobehavioral disorder). It is a common reason for behavior and academic problems in school. CAUSES  The cause of ADHD is unknown in most cases. It may run in families. It sometimes can be associated with learning disabilities and other behavioral problems. SYMPTOMS  There are 3 types of ADHD. The 3 types and some of the symptoms include:  Inattentive   Gets bored or distracted easily.   Loses or forgets things. Forgets to hand in homework.   Has trouble organizing or completing tasks.   Difficulty staying on task.   An inability to organize daily tasks and school work.   Leaving projects, chores, or homework unfinished.   Trouble paying attention or responding to details. Careless mistakes.   Difficulty following directions. Often seems like is not listening.   Dislikes activities that require sustained attention (like chores or homework).   Hyperactive-impulsive   Feels like it is impossible to sit still or stay in a seat. Fidgeting with hands and feet.   Trouble waiting turn.   Talking too much or out of turn. Interruptive.   Speaks or acts impulsively.   Aggressive, disruptive behavior.   Constantly busy or on the go, noisy.   Combined   Has symptoms of both of the above.  Often children with ADHD feel discouraged about themselves and with school. They often perform well below their abilities in school. These symptoms can cause problems in home, school, and in relationships with peers. As children get older, the excess motor activities can calm down, but the problems with paying attention and staying organized persist. Most children do not outgrow ADHD but with good treatment can learn to cope with the symptoms. DIAGNOSIS  When ADHD is suspected, the diagnosis should be made by professionals trained in  ADHD.  Diagnosis will include:  Ruling out other reasons for the child's behavior.   The caregivers will check with the child's school and check their medical records.   They will talk to teachers and parents.   Behavior rating scales for the child will be filled out by those dealing with the child on a daily basis.  A diagnosis is made only after all information has been considered. TREATMENT  Treatment usually includes behavioral treatment often along with medicines. It may include stimulant medicines. The stimulant medicines decrease impulsivity and hyperactivity and increase attention. Other medicines used include antidepressants and certain blood pressure medicines. Most experts agree that treatment for ADHD should address all aspects of the child's functioning. Treatment should not be limited to the use of medicines alone. Treatment should include structured classroom management. The parents must receive education to address rewarding good behavior, discipline, and limit-setting. Tutoring or behavioral therapy or both should be available for the child. If untreated, the disorder can have long-term serious effects into adolescence and adulthood. HOME CARE INSTRUCTIONS   Often with ADHD there is a lot of frustration among the family in dealing with the illness. There is often blame and anger that is not warranted. This is a life long illness. There is no way to prevent ADHD. In many cases, because the problem affects the family as a whole, the entire family may need help. A therapist can help the family find better ways to handle the disruptive behaviors and promote change. If the child is young, most of the therapist's work is with the parents. Parents will   learn techniques for coping with and improving their child's behavior. Sometimes only the child with the ADHD needs counseling. Your caregivers can help you make these decisions.   Children with ADHD may need help in organizing. Some  helpful tips include:   Keep routines the same every day from wake-up time to bedtime. Schedule everything. This includes homework and playtime. This should include outdoor and indoor recreation. Keep the schedule on the refrigerator or a bulletin board where it is frequently seen. Mark schedule changes as far in advance as possible.   Have a place for everything and keep everything in its place. This includes clothing, backpacks, and school supplies.   Encourage writing down assignments and bringing home needed books.   Offer your child a well-balanced diet. Breakfast is especially important for school performance. Children should avoid drinks with caffeine including:   Soft drinks.   Coffee.   Tea.   However, some older children (adolescents) may find these drinks helpful in improving their attention.   Children with ADHD need consistent rules that they can understand and follow. If rules are followed, give small rewards. Children with ADHD often receive, and expect, criticism. Look for good behavior and praise it. Set realistic goals. Give clear instructions. Look for activities that can foster success and self-esteem. Make time for pleasant activities with your child. Give lots of affection.   Parents are their children's greatest advocates. Learn as much as possible about ADHD. This helps you become a stronger and better advocate for your child. It also helps you educate your child's teachers and instructors if they feel inadequate in these areas. Parent support groups are often helpful. A national group with local chapters is called CHADD (Children and Adults with Attention Deficit Hyperactivity Disorder).  PROGNOSIS  There is no cure for ADHD. Children with the disorder seldom outgrow it. Many find adaptive ways to accommodate the ADHD as they mature. SEEK MEDICAL CARE IF:  Your child has repeated muscle twitches, cough or speech outbursts.   Your child has sleep problems.   Your  child has a marked loss of appetite.   Your child develops depression.   Your child has new or worsening behavioral problems.   Your child develops dizziness.   Your child has a racing heart.   Your child has stomach pains.   Your child develops headaches.  Document Released: 06/30/2002 Document Revised: 06/29/2011 Document Reviewed: 02/10/2008 [employer] Patient Information 2012 [employer]. 

## 2011-10-25 ENCOUNTER — Telehealth: Payer: Self-pay | Admitting: Family Medicine

## 2011-10-25 NOTE — Telephone Encounter (Signed)
Pt should be seen---if headache is that bad

## 2011-10-25 NOTE — Telephone Encounter (Signed)
Does she still have headache---if yes she needs appointment---- Not sure why I'm the first one to see this at 9pm.   They are supposed to send msg to cmas ,  I thought.

## 2011-10-25 NOTE — Telephone Encounter (Signed)
Caller: Tineshia/Patient is calling with a question about Effexor Xr.The medication was written by Lelon Perla  States has a migraine history, and Dr. Laury Axon put patient on Effexor 10/23/11.  ONset of new migraine 10/24/11 PM, and it continues now.  Does not have any rescue medications like Imitrex.  States she will "battle it out," and prefers not to use medication such as Imitrex.  States she does use Excedrin Migraine.  Wants to get a note to be excused from work, as she is not able to work with this headache.  INFO TO OFFICE FOR PROVIDER REVIEW/WORK EXCUSE/CALLBACK. MAY REACH PATIENT AT [phone].

## 2011-10-26 ENCOUNTER — Ambulatory Visit (INDEPENDENT_AMBULATORY_CARE_PROVIDER_SITE_OTHER): Payer: BC Managed Care – PPO | Admitting: Family Medicine

## 2011-10-26 ENCOUNTER — Encounter: Payer: Self-pay | Admitting: Family Medicine

## 2011-10-26 DIAGNOSIS — G43909 Migraine, unspecified, not intractable, without status migrainosus: Secondary | ICD-10-CM

## 2011-10-26 MED ORDER — HYDROCODONE-ACETAMINOPHEN 7.5-750 MG PO TABS: 1.0000 | ORAL_TABLET | Freq: Three times a day (TID) | ORAL | Status: DC | PRN

## 2011-10-26 NOTE — Patient Instructions (Signed)

## 2011-10-26 NOTE — Telephone Encounter (Signed)
Spoke with patient and she stated she still had the Migraine, I scheduled her today at 3:30 with Dr.Lowne for an evaluation.      KP

## 2011-10-26 NOTE — Progress Notes (Signed)
  Subjective:    Royalty Fakhouri is a 41 y.o. female who presents for follow-up of migraine headaches. Home treatment has included IB and tylenol with little improvement. Headaches are occurring continuously. Generally, the headaches last about a few hours. Work attendance or other daily activities are not affected by the headaches. The patient denies decreased physical activity, depression, dizziness, loss of balance, muscle weakness, numbness of extremities, speech difficulties, vision problems, vomiting in the early morning and worsening school/work performance.  This headache has been 3 days --she just started effexor to try to prevent them .Marland Kitchen  The following portions of the patient's history were reviewed and updated as appropriate: allergies, current medications, past family history, past medical history, past social history, past surgical history and problem list.  Review of Systems Pertinent items are noted in HPI.    Objective:    BP 116/64  Pulse 81  Temp(Src) 98.6 F (37 C) (Oral)  Wt 221 lb 3.2 oz (100.336 kg)  SpO2 97%  General Appearance:    Alert, cooperative, no distress, appears stated age  Head:    Normocephalic, without obvious abnormality, atraumatic  Eyes:    PERRL, conjunctiva/corneas clear, EOM's intact, fundi    benign, both eyes  Ears:    Normal TM's and external ear canals, both ears  Nose:   Nares normal, septum midline, mucosa normal, no drainage    or sinus tenderness  Throat:   Lips, mucosa, and tongue normal; teeth and gums normal  Neck:   supple  Back:   na  Lungs:     Clear to auscultation bilaterally, respirations unlabored  Chest Wall:   na   Heart:    Regular rate and rhythm, S1 and S2 normal, no murmur, rub   or gallop  Breast Exam:   na  Abdomen:     na  Genitalia:  na  Rectal:  na  Extremities:   Extremities normal, atraumatic, no cyanosis or edema  Pulses:   2+ and symmetric all extremities  Skin:   Skin color, texture, turgor normal, no  rashes or lesions  Lymph nodes:   Cervical, supraclavicular, and axillary nodes normal  Neurologic:   CNII-XII intact, normal strength, sensation and reflexes    throughout     Assessment:    migraine   Plan:      Just started effexor vicodin es  rto prn

## 2011-10-30 ENCOUNTER — Telehealth: Payer: Self-pay | Admitting: Family Medicine

## 2011-10-30 NOTE — Telephone Encounter (Signed)
Caller: Meighan/Patient; PCP: Lelon Perla.; CB#: [phone]; Call regarding Urinary Pain; onset 10/27/11; Afebrile/tactile. LMP 2/29/13.  Pain with urination.  Appt. at 11:30 w/ Dr. Laury Axon per Urinary Sx protocol. Home care for the interim and parameters for callback given.

## 2011-10-31 ENCOUNTER — Ambulatory Visit (INDEPENDENT_AMBULATORY_CARE_PROVIDER_SITE_OTHER): Payer: BC Managed Care – PPO | Admitting: Family Medicine

## 2011-10-31 ENCOUNTER — Encounter: Payer: Self-pay | Admitting: Family Medicine

## 2011-10-31 VITALS — BP 144/76 | HR 72 | Temp 97.9°F | Wt 221.0 lb

## 2011-10-31 DIAGNOSIS — R300 Dysuria: Secondary | ICD-10-CM

## 2011-10-31 LAB — POCT URINALYSIS DIPSTICK
Bilirubin, UA: NEGATIVE
Blood, UA: NEGATIVE
Glucose, UA: NEGATIVE
Ketones, UA: NEGATIVE
Spec Grav, UA: 1.01
Urobilinogen, UA: 0.2

## 2011-10-31 MED ORDER — CIPROFLOXACIN HCL 250 MG PO TABS: 250.0000 mg | ORAL_TABLET | Freq: Two times a day (BID) | ORAL | Status: AC

## 2011-10-31 NOTE — Patient Instructions (Signed)

## 2011-10-31 NOTE — Progress Notes (Signed)
  Subjective:    Patient ID: Jo Jackson, female    DOB: [birth year], 41 y.o.   MRN: [id number]  HPI Pt here c/o dysuria x 4 days.   It is better today.  No fever, no abd pain,  No vag d/c.    Review of Systems As above    Objective:   Physical Exam  Constitutional: She is oriented to person, place, and time. She appears well-developed and well-nourished.  Abdominal: Soft. She exhibits no distension. There is no tenderness.  Neurological: She is alert and oriented to person, place, and time.  Psychiatric: She has a normal mood and affect. Her behavior is normal.  UA neg        Assessment & Plan:  Dysuria--cipro 250 mg bid for 3 days secondary to symptoms                Urine sent for culture

## 2011-11-02 ENCOUNTER — Telehealth: Payer: Self-pay | Admitting: Family Medicine

## 2011-11-02 LAB — URINE CULTURE: Colony Count: >=100000

## 2011-11-02 NOTE — Telephone Encounter (Signed)
Pt calling today 11/02/11 regarding wanting to have Dr. Laury Axon to fill out papers for [facility].  Advised that Dr. Laury Axon will be out of town after today until May 1st.  Pt said she would like them filled out as soon as MD can.  PLEASE CALL PT BACK AT [phone] TO LET HER KNOW WHEN SHE CAN COME BY TO PICK THESE UP.

## 2011-11-02 NOTE — Telephone Encounter (Signed)
Spoke with patient and she stated she has been having Migraines more frequently and has missed a few days of work and her job is requesting her to get [employer] paper work filled out. I advised the patient the paper work will not get done until Dr. Laury Axon is back in the office, at this time the patient does not have any paperwork, she assumed we had it,  Advised she will need to have this information faxed from her employer. She voiced understanding and said she will have them send it and voiced understanding that the paperwork will no be completed until Dr.Lowne is back int he office. She will request her Office notes if needed    KP

## 2011-11-02 NOTE — Telephone Encounter (Signed)
There is no way they will be able to be done before I leave.   Today is my last day until May 1 and we don't even have the paperwork.

## 2011-11-21 ENCOUNTER — Emergency Department (HOSPITAL_COMMUNITY)
Admission: EM | Admit: 2011-11-21 | Discharge: 2011-11-22 | Disposition: A | Discharge status: Home / Self Care | Payer: Worker's Compensation | Attending: Emergency Medicine | Admitting: Emergency Medicine

## 2011-11-21 ENCOUNTER — Encounter (HOSPITAL_COMMUNITY): Payer: Self-pay | Admitting: Family Medicine

## 2011-11-21 DIAGNOSIS — IMO0002 Reserved for concepts with insufficient information to code with codable children: Secondary | ICD-10-CM | POA: Insufficient documentation

## 2011-11-21 DIAGNOSIS — Y990 Civilian activity done for income or pay: Secondary | ICD-10-CM | POA: Insufficient documentation

## 2011-11-21 DIAGNOSIS — S0591XA Unspecified injury of right eye and orbit, initial encounter: Secondary | ICD-10-CM

## 2011-11-21 DIAGNOSIS — H5710 Ocular pain, unspecified eye: Secondary | ICD-10-CM | POA: Insufficient documentation

## 2011-11-21 HISTORY — DX: Supraventricular tachycardia, unspecified: I47.10

## 2011-11-21 HISTORY — DX: Other premature depolarization: I49.49

## 2011-11-21 HISTORY — DX: Junctional premature depolarization: I49.2

## 2011-11-21 HISTORY — DX: Supraventricular tachycardia: I47.1

## 2011-11-21 MED ORDER — FLUORESCEIN SODIUM 1 MG OP STRP
1.0000 | ORAL_STRIP | Freq: Once | OPHTHALMIC | Status: DC
  Filled 2011-11-21: qty 1

## 2011-11-21 MED ORDER — CIPROFLOXACIN HCL 0.3 % OP SOLN
1.0000 [drp] | Freq: Once | OPHTHALMIC | Status: AC
  Administered 2011-11-22: 1 [drp] via OPHTHALMIC
  Filled 2011-11-21: qty 2.5

## 2011-11-21 MED ORDER — POLYMYXIN B-TRIMETHOPRIM 10000-0.1 UNIT/ML-% OP SOLN
1.0000 [drp] | OPHTHALMIC | Status: DC
  Filled 2011-11-21: qty 10

## 2011-11-21 NOTE — ED Provider Notes (Signed)
History     CSN: [id number]  Arrival date & time 11/21/11  2057   First MD Initiated Contact with Patient 11/21/11 2301      Chief Complaint  Patient presents with  . Eye Pain    (Consider location/radiation/quality/duration/timing/severity/associated sxs/prior treatment) HPI  41 year old female presents with a chief complaint of eye injury. Patient states she accidentally hits her eye with a [employer] package at work.  Patient states she was opening up an envelope when the edge of the envelope hits her right eyes. She noticed some eye discomfort but denies any vision change changes. No pain with eye movement. Doesn't feels any foreign object sensation. She does not wear contact lens.  Past Medical History  Diagnosis Date  . Headache   . ADD (attention deficit disorder)   . Junctional escape rhythm   . Supraventricular tachycardia     Past Surgical History  Procedure Date  . Appendectomy 1986  . Carpal tunnel release 2000  . Cervical fusion 08-24-99,04-26-00,05-09-01  . Shoulder surgery left-6-04, right 09-12-04  . Tongue polyp 1984  . Ablation of dysrhythmic focus     Family History  Problem Relation Age of Onset  . Diabetes    . Thyroid disease    . Hypertension    . Hodgkin's lymphoma Father   . Depression    . Bipolar disorder      History  Substance Use Topics  . Smoking status: Former Games developer  . Smokeless tobacco: Not on file  . Alcohol Use: Yes    OB History    Grav Para Term Preterm Abortions TAB SAB Ect Mult Living                  Review of Systems  Constitutional: Negative for fever.  Eyes: Positive for pain. Negative for photophobia, discharge, redness, itching and visual disturbance.  Neurological: Negative for numbness.    Allergies  Ketorolac tromethamine; Latex; and Morphine  Home Medications   Current Outpatient Rx  Name Route Sig Dispense Refill  . AMPHETAMINE-DEXTROAMPHETAMINE 30 MG PO TABS Oral Take 15-30 mg by mouth 2 (two) times  daily.    . ADULT MULTIVITAMIN W/MINERALS CH Oral Take 1 tablet by mouth daily.    Marland Kitchen RANITIDINE HCL 150 MG PO TABS Oral Take 150 mg by mouth daily as needed. For heartburn.    . VENLAFAXINE HCL ER 75 MG PO CP24 Oral Take 1 capsule (75 mg total) by mouth daily. 30 capsule 5    BP 130/77  Pulse 79  Temp(Src) 97.7 F (36.5 C) (Oral)  Resp 20  Wt 221 lb (100.245 kg)  SpO2 100%  LMP 11/06/2011  Physical Exam  Constitutional: She appears well-developed and well-nourished. No distress.  HENT:  Head: Normocephalic and atraumatic.  Eyes: Conjunctivae, EOM and lids are normal. Pupils are equal, round, and reactive to light. No foreign bodies found. Right eye exhibits no chemosis, no discharge, no exudate and no hordeolum. No foreign body present in the right eye. Left eye exhibits no chemosis, no discharge, no exudate and no hordeolum. No foreign body present in the left eye. Right conjunctiva is not injected. Right conjunctiva has no hemorrhage. Left conjunctiva is not injected. Left conjunctiva has no hemorrhage. No scleral icterus.  Slit lamp exam:      The right eye shows no corneal abrasion, no corneal flare, no corneal ulcer, no foreign body, no hyphema, no hypopyon and no fluorescein uptake.  Neck: Neck supple.  Pulmonary/Chest: Effort normal.  Skin:  Skin is warm.    ED Course  Procedures (including critical care time)  Labs Reviewed - No data to display No results found.   No diagnosis found.  23:29:02 Visual Acuity Visual Acuity - R Distance: 20/20 ; L Distance: 20/15 Harless Nakayama, RN   MDM  C/o edge of envelope hits R eye.  No significant finding on exam. Normal visual acuity.  cipro eye drops given.  Care instruction given.          Fayrene Helper, PA-C 11/22/11 0005

## 2011-11-21 NOTE — ED Notes (Signed)
Patient states she hit herself in the eye with a package at work ([employer]). Denies vision changes.

## 2011-11-22 NOTE — Discharge Instructions (Signed)
Apply 1-2 drops of eyedrop every 6 hrs for the next 4 days to prevent eye infection.  Avoid rubbing eye.   HOME CARE  You may be given drops or a medicated cream. Use the medicine as told by your doctor.   A pressure patch may be put over the eye. If this is done, follow your doctor's instructions for when to remove the patch. Do not drive or use machines while the eye patch is on. Judging distances is hard to do with a patch on.   See your doctor for a follow-up exam if you are told to do so.  GET HELP RIGHT AWAY IF:   The pain is getting worse or is very bad.   The eye is very sensitive to light.   Any liquid comes out of the injured eye after treatment.   Your vision suddenly gets worse.   You have a sudden loss of vision or blindness.  MAKE SURE YOU:   Understand these instructions.   Will watch your condition.   Will get help right away if you are not doing well or get worse.

## 2011-11-22 NOTE — ED Provider Notes (Signed)
Medical screening examination/treatment/procedure(s) were performed by non-physician practitioner and as supervising physician I was immediately available for consultation/collaboration.  Rylee Huestis T Barry Faircloth, MD 11/22/11 1621 

## 2011-12-26 ENCOUNTER — Telehealth: Payer: Self-pay | Admitting: Family Medicine

## 2011-12-26 NOTE — Telephone Encounter (Signed)
Caller: Karmen Bongo; PCP: Lelon Perla.; CB#: [phone]; ; ; Call regarding Headache/Vomiting; onset of headache 10 days ago.  Onset of new vomiting 0700 12/26/11.  Vomiting episodes x 3.  Has had history of neck surgery in the past.  Headache is worse than it has been duriing past 10 days.  Per protocol, advised being seen within 4 hours.  Appts unavailable in Epic; to take to [facility].

## 2011-12-26 NOTE — Telephone Encounter (Signed)
Late Entry:FYI, received call stating that the pt needs to be seen however no apt noted available, pt was told to go to UC however [facility] rep needed permission to send pt to UC, advised that it is ok to send pt to UC, advised MD Lowne assistant about instructions given to pt, MD Lowne currently aware, review previous phone note for update

## 2011-12-26 NOTE — Telephone Encounter (Signed)
Caller: Karmen Bongo; PCP: Lelon Perla.; CB#: [phone];  Call regarding Nausea and Vomiting.  Onset; 12/26/11 0600.  Afebrile.  No diarrhea.  Last emesis at 1515; reports at least 10 emesis so far today.  Drinking fluids. LMP: 12/10/11. Moderate headache present X 10 days; Stiff neck now present.  No appt remain in EPIC for 12/26/11 at [facility] or [place] office. Advised to see [facility] now for new onset of nausea/vomiting with stiff neck and generalized headache per Nausea or Vomiting Guideline.

## 2011-12-26 NOTE — Telephone Encounter (Signed)
Agree ---but they were supposed to go much earlier today---I want to make sure they actually go

## 2011-12-26 NOTE — Telephone Encounter (Signed)
Please make sure she went to [facility] or ER

## 2011-12-26 NOTE — Telephone Encounter (Signed)
Spoke with Pt husband who states that Pt is currently sleeping and has not had a episode in a hour so she just wants to rest now. Advise Pt husband that due to symptoms. Pt needs to be awoken and needs to be seen in ED/UC immediatly. Pt husband ok will awake Pt and take her.

## 2012-02-13 ENCOUNTER — Telehealth: Payer: Self-pay | Admitting: Family Medicine

## 2012-02-13 MED ORDER — AMPHETAMINE-DEXTROAMPHETAMINE 30 MG PO TABS: 15.0000 mg | ORAL_TABLET | Freq: Two times a day (BID) | ORAL | Status: DC

## 2012-02-13 MED ORDER — VENLAFAXINE HCL ER 150 MG PO CP24: 150.0000 mg | ORAL_CAPSULE | Freq: Every day | ORAL | Status: DC

## 2012-02-13 NOTE — Telephone Encounter (Signed)
[name] calling today 02/13/12 she needs hand written prescription refill for Adderal, and would also like to increase the dosage for her Effexor.  PLEASE CALL PT BACK AT [phone] TO LET HER KNOW WHEN SHE CAN COME BY OFFICE TO PICK THESE UP.

## 2012-02-13 NOTE — Telephone Encounter (Signed)
Patient aware Rx faxed and she can pick up adderall tomorrow    KP

## 2012-02-13 NOTE — Telephone Encounter (Signed)
effexor xr  150 mg #30 1 po qd ,  5 refills

## 2012-02-13 NOTE — Telephone Encounter (Signed)
Patient would like to increase her effexor. Please advise    KP

## 2012-03-29 ENCOUNTER — Ambulatory Visit (INDEPENDENT_AMBULATORY_CARE_PROVIDER_SITE_OTHER): Payer: BC Managed Care – PPO | Admitting: Family Medicine

## 2012-03-29 ENCOUNTER — Encounter: Payer: Self-pay | Admitting: Family Medicine

## 2012-03-29 VITALS — BP 128/83 | HR 97 | Temp 98.4°F | Ht 68.25 in | Wt 228.4 lb

## 2012-03-29 DIAGNOSIS — J019 Acute sinusitis, unspecified: Secondary | ICD-10-CM

## 2012-03-29 MED ORDER — AMOXICILLIN 875 MG PO TABS: 875.0000 mg | ORAL_TABLET | Freq: Two times a day (BID) | ORAL | Status: AC

## 2012-03-29 NOTE — Progress Notes (Signed)
  Subjective:    Patient ID: Jo Jackson, female    DOB: 28-Aug-1970, 41 y.o.   MRN: [id number]  HPI ? Sinus infxn- sxs started ~2 weeks ago, has been using netti pot, taking OTC antihistamine (Claritin D).  + sinus pain.  Minimal nasal congestion.  + PND.  No ear pain.  + swollen glands.  + fibro flare.  Minimal cough.  + sick contacts.  No fevers.   Review of Systems For ROS see HPI     Objective:   Physical Exam  Vitals reviewed. Constitutional: She appears well-developed and well-nourished. No distress.  HENT:  Head: Normocephalic and atraumatic.  Right Ear: Tympanic membrane normal.  Left Ear: Tympanic membrane normal.  Nose: Mucosal edema and rhinorrhea present. Right sinus exhibits maxillary sinus tenderness and frontal sinus tenderness. Left sinus exhibits maxillary sinus tenderness and frontal sinus tenderness.  Mouth/Throat: Uvula is midline and mucous membranes are normal. Posterior oropharyngeal erythema present. No oropharyngeal exudate.  Eyes: Conjunctivae and EOM are normal. Pupils are equal, round, and reactive to light.  Neck: Normal range of motion. Neck supple.  Cardiovascular: Normal rate, regular rhythm and normal heart sounds.   Pulmonary/Chest: Effort normal and breath sounds normal. No respiratory distress. She has no wheezes.  Lymphadenopathy:    She has no cervical adenopathy.          Assessment & Plan:

## 2012-03-29 NOTE — Assessment & Plan Note (Signed)
Pt's hx and PE consistent w/ infxn.  Start abx.  Reviewed supportive care and red flags that should prompt return.  Pt expressed understanding and is in agreement w/ plan.  

## 2012-03-29 NOTE — Patient Instructions (Addendum)
This is a sinus infection Start the Amox twice daily w/ food Add Mucinex to thin your congestion and post nasal drip Continue claritin or zyrtec Drink plenty of fluids Hang in there!!!

## 2012-04-05 ENCOUNTER — Encounter: Payer: Self-pay | Admitting: Family Medicine

## 2012-04-05 ENCOUNTER — Ambulatory Visit (INDEPENDENT_AMBULATORY_CARE_PROVIDER_SITE_OTHER): Payer: BC Managed Care – PPO | Admitting: Family Medicine

## 2012-04-05 VITALS — BP 114/76 | HR 81 | Temp 98.2°F | Wt 227.6 lb

## 2012-04-05 DIAGNOSIS — F322 Major depressive disorder, single episode, severe without psychotic features: Secondary | ICD-10-CM

## 2012-04-05 DIAGNOSIS — IMO0001 Reserved for inherently not codable concepts without codable children: Secondary | ICD-10-CM

## 2012-04-05 DIAGNOSIS — M797 Fibromyalgia: Secondary | ICD-10-CM

## 2012-04-05 DIAGNOSIS — G43909 Migraine, unspecified, not intractable, without status migrainosus: Secondary | ICD-10-CM

## 2012-04-05 DIAGNOSIS — F329 Major depressive disorder, single episode, unspecified: Secondary | ICD-10-CM

## 2012-04-05 DIAGNOSIS — F3289 Other specified depressive episodes: Secondary | ICD-10-CM

## 2012-04-05 DIAGNOSIS — F988 Other specified behavioral and emotional disorders with onset usually occurring in childhood and adolescence: Secondary | ICD-10-CM

## 2012-04-05 MED ORDER — AMPHETAMINE-DEXTROAMPHETAMINE 30 MG PO TABS: 15.0000 mg | ORAL_TABLET | Freq: Two times a day (BID) | ORAL | Status: DC

## 2012-04-05 MED ORDER — HYDROCODONE-ACETAMINOPHEN 7.5-750 MG PO TABS: 1.0000 | ORAL_TABLET | Freq: Three times a day (TID) | ORAL | Status: AC | PRN

## 2012-04-05 MED ORDER — CYCLOBENZAPRINE HCL 10 MG PO TABS: 10.0000 mg | ORAL_TABLET | Freq: Three times a day (TID) | ORAL | Status: DC | PRN

## 2012-04-05 MED ORDER — ARIPIPRAZOLE 5 MG PO TABS: 5.0000 mg | ORAL_TABLET | Freq: Every day | ORAL | Status: DC

## 2012-04-05 NOTE — Progress Notes (Signed)
  Subjective:    Jo Jackson is a 41 y.o. female who presents for evaluation of headache. Symptoms began about a few days ago. Generally, the headaches last about several hours and occur once per month. The headaches do not seem to be related to any time of day or year. The headaches are usually pounding and are located in front and base of neck. Recently, the headaches have been increasing in both severity and frequency. Work attendance or other daily activities are not affected by the headaches. Precipitating factors include: none which have been determined. The headaches are usually not preceded by an aura. Associated neurologic symptoms: decreased physical activity and depression. The patient denies dizziness, loss of balance, muscle weakness, numbness of extremities, speech difficulties, vision problems, vomiting in the early morning and worsening school/work performance. Home treatment has included darkening the room and resting with little improvement. Other history includes: migraine headaches diagnosed in the past. Family history includes no known family members with significant headaches.  The following portions of the patient's history were reviewed and updated as appropriate: allergies, current medications, past family history, past medical history, past social history, past surgical history and problem list.  Review of Systems As above  Objective:    BP 114/76  Pulse 81  Temp 98.2 F (36.8 C) (Oral)  Wt 227 lb 9.6 oz (103.239 kg)  SpO2 98%  LMP 03/29/2012 General appearance: alert, cooperative, appears stated age and no distress Head: Normocephalic, without obvious abnormality, atraumatic Neck: no adenopathy, supple, symmetrical, trachea midline and thyroid not enlarged, symmetric, no tenderness/mass/nodules Lungs: clear to auscultation bilaterally Heart: S1, S2 normal    Assessment:    Classic migraine   fibromyalgia--  Muscle relaxer, vicodin refill  ADD--- refill  adderall Major depression--- con't effexor and add abilify--rto 2 weeks Plan:    Lie in darkened room and apply cold packs as needed for pain. Episodic therapy: oral narcotic analgesics due to low frequency of pain. Side effect profile discussed in detail. Asked to keep headache diary. Patient reassured that neurodiagnostic workup not indicated from benign H&P.

## 2012-04-05 NOTE — Patient Instructions (Addendum)

## 2012-04-18 ENCOUNTER — Other Ambulatory Visit: Payer: Self-pay | Admitting: Family Medicine

## 2012-04-19 NOTE — Telephone Encounter (Signed)
Last seen and filled 04/05/12 # 30. Please advise      KP

## 2012-04-29 ENCOUNTER — Other Ambulatory Visit: Payer: Self-pay

## 2012-04-29 DIAGNOSIS — F988 Other specified behavioral and emotional disorders with onset usually occurring in childhood and adolescence: Secondary | ICD-10-CM

## 2012-04-29 MED ORDER — AMPHETAMINE-DEXTROAMPHETAMINE 30 MG PO TABS: 15.0000 mg | ORAL_TABLET | Freq: Two times a day (BID) | ORAL | Status: DC

## 2012-04-29 NOTE — Telephone Encounter (Signed)
Last Ov 04/05/12. Last filled 04/05/12 #60 no refills. Need okay.        MW

## 2012-05-22 ENCOUNTER — Encounter (HOSPITAL_COMMUNITY): Payer: Self-pay | Admitting: *Deleted

## 2012-05-22 ENCOUNTER — Inpatient Hospital Stay (HOSPITAL_COMMUNITY)
Admission: AD | Admit: 2012-05-22 | Discharge: 2012-05-23 | Disposition: A | Discharge status: Home / Self Care | Payer: BC Managed Care – PPO | Source: Ambulatory Visit | Attending: Obstetrics and Gynecology | Admitting: Obstetrics and Gynecology

## 2012-05-22 DIAGNOSIS — O99891 Other specified diseases and conditions complicating pregnancy: Secondary | ICD-10-CM | POA: Insufficient documentation

## 2012-05-22 DIAGNOSIS — G43909 Migraine, unspecified, not intractable, without status migrainosus: Secondary | ICD-10-CM

## 2012-05-22 LAB — OB RESULTS CONSOLE ANTIBODY SCREEN: Antibody Screen: NEGATIVE

## 2012-05-22 LAB — OB RESULTS CONSOLE RPR: RPR: NONREACTIVE

## 2012-05-22 LAB — OB RESULTS CONSOLE RUBELLA ANTIBODY, IGM: Rubella: IMMUNE

## 2012-05-22 LAB — OB RESULTS CONSOLE HEPATITIS B SURFACE ANTIGEN: Hepatitis B Surface Ag: NEGATIVE

## 2012-05-22 MED ORDER — HYDROCODONE-ACETAMINOPHEN 5-325 MG PO TABS
1.0000 | ORAL_TABLET | Freq: Once | ORAL | Status: AC
  Administered 2012-05-22: 1 via ORAL
  Filled 2012-05-22: qty 1

## 2012-05-22 NOTE — MAU Note (Signed)
Pt states she has a headache that started yesterday afternoon.

## 2012-05-22 NOTE — MAU Note (Signed)
Pt reports she has a history of migraines, started getting one last pm. Took tylenol today but it did not help. Light sensitive and nausea, took zofran at 7 pm.

## 2012-05-22 NOTE — MAU Provider Note (Signed)
History     CSN: [id number]  Arrival date and time: 05/22/12 2229   None     Chief Complaint  Patient presents with  . Headache   HPI  Jo Jackson is a 41 y.o. G3P1011 at [redacted]w[redacted]d who presents today with a migraine headache. She has a history of migraines, and this feels like a typical migraine to her. She usually takes Vicodin for them, but she has not taken anything at this time 2/2 the pregnancy. She would like some pain medicine now.   Past Medical History  Diagnosis Date  . Headache   . ADD (attention deficit disorder)   . Junctional escape rhythm   . Supraventricular tachycardia     Past Surgical History  Procedure Date  . Appendectomy 1986  . Carpal tunnel release 2000  . Cervical fusion 08-24-99,04-26-00,05-09-01  . Shoulder surgery left-6-04, right 09-12-04  . Tongue polyp 1984  . Ablation of dysrhythmic focus     Family History  Problem Relation Age of Onset  . Diabetes    . Thyroid disease    . Hypertension    . Hodgkin's lymphoma Father   . Depression    . Bipolar disorder      History  Substance Use Topics  . Smoking status: Former Smoker    Quit date: 10/23/2007  . Smokeless tobacco: Not on file  . Alcohol Use: Yes    Allergies:  Allergies  Allergen Reactions  . Ketorolac Tromethamine     REACTION: HIVES  . Latex   . Morphine     Prescriptions prior to admission  Medication Sig Dispense Refill  . amphetamine-dextroamphetamine (ADDERALL) 30 MG tablet Take 0.5-1 tablets (15-30 mg total) by mouth 2 (two) times daily.  60 tablet  0  . ARIPiprazole (ABILIFY) 5 MG tablet Take 1 tablet (5 mg total) by mouth daily.  30 tablet  2  . cyclobenzaprine (FLEXERIL) 10 MG tablet TAKE ONE TABLET BY MOUTH THREE TIMES DAILY AS NEEDED FOR MUSCLE SPASM  30 tablet  0  . Multiple Vitamin (MULITIVITAMIN WITH MINERALS) TABS Take 1 tablet by mouth daily.      . ranitidine (ZANTAC) 150 MG tablet Take 150 mg by mouth daily as needed. For heartburn.      .  venlafaxine XR (EFFEXOR XR) 150 MG 24 hr capsule Take 1 capsule (150 mg total) by mouth daily.  30 capsule  5    Review of Systems  Constitutional: Negative for fever and chills.  HENT: Negative for congestion.   Eyes: Negative for blurred vision, double vision and photophobia.  Respiratory: Negative for cough.   Cardiovascular: Negative for chest pain.  Gastrointestinal: Negative for nausea, vomiting, abdominal pain, diarrhea and constipation.  Genitourinary: Negative for dysuria, urgency and frequency.  Musculoskeletal: Negative for myalgias.  Neurological: Positive for headaches (see HPI). Negative for dizziness and tingling.   Physical Exam   Blood pressure 125/69, pulse 79, temperature 97.7 F (36.5 C), temperature source Oral, resp. rate 18, height 5\' 7"  (1.702 m), weight 105.235 kg (232 lb), last menstrual period 03/29/2012, SpO2 100.00%.  Physical Exam  Nursing note and vitals reviewed. Constitutional: She is oriented to person, place, and time. She appears well-developed and well-nourished.  Cardiovascular: Normal rate and regular rhythm.   Respiratory: Effort normal and breath sounds normal.  GI: Soft.  Neurological: She is alert and oriented to person, place, and time.  Skin: Skin is warm and dry.  Psychiatric: She has a normal mood and affect. Her behavior  is normal. Thought content normal.    MAU Course  Procedures  1204: Pt states pain is better after taking Vicodin. Dr. Langston Masker notified of patient and patient status. OK for DC home.   Assessment and Plan   1. Migraine   1st trimester danger signs reviewed FU with PCP as scheduled.   Tawnya Crook 05/22/2012, 11:00 PM

## 2012-05-23 DIAGNOSIS — G43909 Migraine, unspecified, not intractable, without status migrainosus: Secondary | ICD-10-CM

## 2012-05-23 MED ORDER — HYDROCODONE-ACETAMINOPHEN 5-325 MG PO TABS
1.0000 | ORAL_TABLET | Freq: Once | ORAL | Status: AC
  Administered 2012-05-23: 1 via ORAL
  Filled 2012-05-23: qty 1

## 2012-05-23 NOTE — MAU Note (Signed)
Pt instructed that she should call her husband because she would not be able to drive prior to administration of pain medication

## 2012-06-06 ENCOUNTER — Other Ambulatory Visit: Payer: Self-pay | Admitting: Obstetrics and Gynecology

## 2012-06-06 LAB — OB RESULTS CONSOLE GC/CHLAMYDIA: Chlamydia: NEGATIVE

## 2012-07-24 NOTE — L&D Delivery Note (Signed)
Delivery Note At 12:47 PM a viable female was delivered via Vaginal, Spontaneous Delivery (Presentation: ; Occiput Anterior).  APGAR: 8, 9; weight .   Placenta status: Intact, Spontaneous.  Cord: 3 vessels with the following complications: None.  Cord pH: na  Anesthesia: Epidural  Episiotomy: None Lacerations: 2nd degree;Perineal Suture Repair: 2.0 chromic Est. Blood Loss (mL): 350  Mom to postpartum.  Baby to nursery-stable.  Jo Jackson S 12/15/2012, 1:14 PM

## 2012-09-07 ENCOUNTER — Other Ambulatory Visit: Payer: Self-pay

## 2012-12-13 ENCOUNTER — Encounter (HOSPITAL_COMMUNITY): Payer: Self-pay | Admitting: Family

## 2012-12-13 ENCOUNTER — Inpatient Hospital Stay (HOSPITAL_COMMUNITY)
Admission: AD | Admit: 2012-12-13 | Discharge: 2012-12-13 | Disposition: A | Discharge status: Home / Self Care | Payer: BC Managed Care – PPO | Source: Ambulatory Visit | Attending: Obstetrics and Gynecology | Admitting: Obstetrics and Gynecology

## 2012-12-13 DIAGNOSIS — O479 False labor, unspecified: Secondary | ICD-10-CM | POA: Insufficient documentation

## 2012-12-13 LAB — COMPREHENSIVE METABOLIC PANEL
AST: 12 U/L (ref 0–37)
Albumin: 2.4 g/dL — ABNORMAL LOW (ref 3.5–5.2)
BUN: 3 mg/dL — ABNORMAL LOW (ref 6–23)
Creatinine, Ser: 0.53 mg/dL (ref 0.50–1.10)
Potassium: 3.3 mEq/L — ABNORMAL LOW (ref 3.5–5.1)
Total Protein: 5.8 g/dL — ABNORMAL LOW (ref 6.0–8.3)

## 2012-12-13 LAB — URINE MICROSCOPIC-ADD ON

## 2012-12-13 LAB — URINALYSIS, ROUTINE W REFLEX MICROSCOPIC
Glucose, UA: NEGATIVE mg/dL
Ketones, ur: 15 mg/dL — AB
Protein, ur: NEGATIVE mg/dL
Urobilinogen, UA: 0.2 mg/dL (ref 0.0–1.0)

## 2012-12-13 LAB — CBC
HCT: 34.6 % — ABNORMAL LOW (ref 36.0–46.0)
MCHC: 34.7 g/dL (ref 30.0–36.0)
Platelets: 156 10*3/uL (ref 150–400)
RDW: 13 % (ref 11.5–15.5)

## 2012-12-13 LAB — URIC ACID: Uric Acid, Serum: 3.6 mg/dL (ref 2.4–7.0)

## 2012-12-13 LAB — LACTATE DEHYDROGENASE: LDH: 196 U/L (ref 94–250)

## 2012-12-13 NOTE — MAU Note (Addendum)
Patient presents to MAU with c/o contracting every 10 minutes since 1600 today. Reports she was seen at office today for prenatal visit. Reports +FM; denies vaginal bleeding, LOF.  Denies HSV.

## 2012-12-14 ENCOUNTER — Inpatient Hospital Stay (HOSPITAL_COMMUNITY)
Admission: AD | Admit: 2012-12-14 | Discharge: 2012-12-17 | DRG: 373 | Disposition: A | Discharge status: Home / Self Care | Payer: BC Managed Care – PPO | Source: Ambulatory Visit | Attending: Obstetrics and Gynecology | Admitting: Obstetrics and Gynecology

## 2012-12-14 ENCOUNTER — Encounter (HOSPITAL_COMMUNITY): Payer: Self-pay | Admitting: *Deleted

## 2012-12-14 DIAGNOSIS — O09529 Supervision of elderly multigravida, unspecified trimester: Secondary | ICD-10-CM | POA: Diagnosis present

## 2012-12-14 DIAGNOSIS — O99892 Other specified diseases and conditions complicating childbirth: Secondary | ICD-10-CM | POA: Diagnosis present

## 2012-12-14 DIAGNOSIS — Z22330 Carrier of Group B streptococcus: Secondary | ICD-10-CM

## 2012-12-14 HISTORY — DX: Gestational (pregnancy-induced) hypertension without significant proteinuria, unspecified trimester: O13.9

## 2012-12-14 HISTORY — DX: Unspecified osteoarthritis, unspecified site: M19.90

## 2012-12-14 HISTORY — DX: Depression, unspecified: F32.A

## 2012-12-14 HISTORY — DX: Reserved for concepts with insufficient information to code with codable children: IMO0002

## 2012-12-14 HISTORY — DX: Anxiety disorder, unspecified: F41.9

## 2012-12-14 HISTORY — DX: Unspecified abnormal cytological findings in specimens from cervix uteri: R87.619

## 2012-12-14 HISTORY — DX: Major depressive disorder, single episode, unspecified: F32.9

## 2012-12-14 HISTORY — DX: Myoneural disorder, unspecified: G70.9

## 2012-12-14 HISTORY — DX: Gastro-esophageal reflux disease without esophagitis: K21.9

## 2012-12-14 LAB — CBC
HCT: 34.2 % — ABNORMAL LOW (ref 36.0–46.0)
MCHC: 35.4 g/dL (ref 30.0–36.0)
MCV: 83.8 fL (ref 78.0–100.0)
RDW: 13 % (ref 11.5–15.5)

## 2012-12-14 MED ORDER — LIDOCAINE HCL (PF) 1 % IJ SOLN
30.0000 mL | INTRAMUSCULAR | Status: AC | PRN
  Administered 2012-12-15: 30 mL via SUBCUTANEOUS
  Filled 2012-12-14 (×2): qty 30

## 2012-12-14 MED ORDER — OXYTOCIN BOLUS FROM INFUSION: 500.0000 mL | INTRAVENOUS | Status: DC

## 2012-12-14 MED ORDER — CITRIC ACID-SODIUM CITRATE 334-500 MG/5ML PO SOLN: 30.0000 mL | ORAL | Status: DC | PRN

## 2012-12-14 MED ORDER — LACTATED RINGERS IV SOLN
INTRAVENOUS | Status: DC
  Administered 2012-12-14 – 2012-12-15 (×2): via INTRAVENOUS

## 2012-12-14 MED ORDER — PENICILLIN G POTASSIUM 5000000 UNITS IJ SOLR
2.5000 10*6.[IU] | INTRAVENOUS | Status: DC
  Administered 2012-12-15 (×3): 2.5 10*6.[IU] via INTRAVENOUS
  Filled 2012-12-14 (×7): qty 2.5

## 2012-12-14 MED ORDER — ONDANSETRON HCL 4 MG/2ML IJ SOLN: 4.0000 mg | Freq: Four times a day (QID) | INTRAMUSCULAR | Status: DC | PRN

## 2012-12-14 MED ORDER — LACTATED RINGERS IV SOLN: 500.0000 mL | INTRAVENOUS | Status: DC | PRN

## 2012-12-14 MED ORDER — OXYTOCIN 40 UNITS IN LACTATED RINGERS INFUSION - SIMPLE MED: 62.5000 mL/h | INTRAVENOUS | Status: DC

## 2012-12-14 MED ORDER — IBUPROFEN 600 MG PO TABS
600.0000 mg | ORAL_TABLET | Freq: Four times a day (QID) | ORAL | Status: DC | PRN
  Administered 2012-12-15: 600 mg via ORAL
  Filled 2012-12-14: qty 1

## 2012-12-14 MED ORDER — FAMOTIDINE 20 MG PO TABS
20.0000 mg | ORAL_TABLET | Freq: Every day | ORAL | Status: DC
  Administered 2012-12-15: 20 mg via ORAL
  Filled 2012-12-14: qty 1

## 2012-12-14 MED ORDER — OXYCODONE-ACETAMINOPHEN 5-325 MG PO TABS
1.0000 | ORAL_TABLET | ORAL | Status: DC | PRN
  Administered 2012-12-15: 1 via ORAL
  Filled 2012-12-14: qty 1

## 2012-12-14 MED ORDER — FLEET ENEMA 7-19 GM/118ML RE ENEM: 1.0000 | ENEMA | RECTAL | Status: DC | PRN

## 2012-12-14 MED ORDER — ACETAMINOPHEN 325 MG PO TABS: 650.0000 mg | ORAL_TABLET | ORAL | Status: DC | PRN

## 2012-12-14 MED ORDER — PENICILLIN G POTASSIUM 5000000 UNITS IJ SOLR
5.0000 10*6.[IU] | Freq: Once | INTRAVENOUS | Status: AC
  Administered 2012-12-15: 5 10*6.[IU] via INTRAVENOUS
  Filled 2012-12-14: qty 5

## 2012-12-14 NOTE — MAU Note (Signed)
Patient presents to MAU for follow-up BP check and NST.

## 2012-12-14 NOTE — MAU Note (Signed)
Here for b/p check. C/O moderate headache. Taking norco for pain with some relief. Good fetal movement reported.

## 2012-12-15 ENCOUNTER — Inpatient Hospital Stay (HOSPITAL_COMMUNITY): Payer: BC Managed Care – PPO | Admitting: Anesthesiology

## 2012-12-15 ENCOUNTER — Encounter (HOSPITAL_COMMUNITY): Payer: Self-pay | Admitting: Anesthesiology

## 2012-12-15 ENCOUNTER — Encounter (HOSPITAL_COMMUNITY): Payer: Self-pay | Admitting: *Deleted

## 2012-12-15 LAB — URINE CULTURE

## 2012-12-15 LAB — ABO/RH: ABO/RH(D): B POS

## 2012-12-15 MED ORDER — PHENYLEPHRINE 40 MCG/ML (10ML) SYRINGE FOR IV PUSH (FOR BLOOD PRESSURE SUPPORT)
80.0000 ug | PREFILLED_SYRINGE | INTRAVENOUS | Status: DC | PRN
  Filled 2012-12-15 (×2): qty 5
  Filled 2012-12-15: qty 2

## 2012-12-15 MED ORDER — FAMOTIDINE 20 MG PO TABS
20.0000 mg | ORAL_TABLET | Freq: Every day | ORAL | Status: DC
  Administered 2012-12-15 – 2012-12-16 (×2): 20 mg via ORAL
  Filled 2012-12-15 (×2): qty 1

## 2012-12-15 MED ORDER — EPHEDRINE 5 MG/ML INJ
10.0000 mg | INTRAVENOUS | Status: DC | PRN
  Filled 2012-12-15: qty 2
  Filled 2012-12-15 (×2): qty 4

## 2012-12-15 MED ORDER — LACTATED RINGERS IV SOLN
500.0000 mL | Freq: Once | INTRAVENOUS | Status: AC
  Administered 2012-12-15: 500 mL via INTRAVENOUS

## 2012-12-15 MED ORDER — BUPIVACAINE HCL (PF) 0.25 % IJ SOLN
INTRAMUSCULAR | Status: DC | PRN
  Administered 2012-12-15: 5 mL

## 2012-12-15 MED ORDER — LIDOCAINE HCL (PF) 1 % IJ SOLN
INTRAMUSCULAR | Status: DC | PRN
  Administered 2012-12-15: 3 mL
  Administered 2012-12-15 (×2): 5 mL

## 2012-12-15 MED ORDER — PRENATAL MULTIVITAMIN CH
1.0000 | ORAL_TABLET | Freq: Every day | ORAL | Status: DC
  Administered 2012-12-16: 1 via ORAL
  Filled 2012-12-15: qty 1

## 2012-12-15 MED ORDER — TETANUS-DIPHTH-ACELL PERTUSSIS 5-2.5-18.5 LF-MCG/0.5 IM SUSP: 0.5000 mL | Freq: Once | INTRAMUSCULAR | Status: DC

## 2012-12-15 MED ORDER — BISACODYL 10 MG RE SUPP: 10.0000 mg | Freq: Every day | RECTAL | Status: DC | PRN

## 2012-12-15 MED ORDER — LANOLIN HYDROUS EX OINT: TOPICAL_OINTMENT | CUTANEOUS | Status: DC | PRN

## 2012-12-15 MED ORDER — FLEET ENEMA 7-19 GM/118ML RE ENEM: 1.0000 | ENEMA | Freq: Every day | RECTAL | Status: DC | PRN

## 2012-12-15 MED ORDER — DIPHENHYDRAMINE HCL 50 MG/ML IJ SOLN
12.5000 mg | INTRAMUSCULAR | Status: DC | PRN
  Administered 2012-12-15 (×2): 12.5 mg via INTRAVENOUS
  Filled 2012-12-15: qty 1

## 2012-12-15 MED ORDER — FAMOTIDINE 20 MG PO TABS: 20.0000 mg | ORAL_TABLET | Freq: Two times a day (BID) | ORAL | Status: DC

## 2012-12-15 MED ORDER — FAMOTIDINE 20 MG PO TABS
20.0000 mg | ORAL_TABLET | Freq: Two times a day (BID) | ORAL | Status: DC
  Administered 2012-12-15: 20 mg via ORAL
  Filled 2012-12-15: qty 1

## 2012-12-15 MED ORDER — SENNOSIDES-DOCUSATE SODIUM 8.6-50 MG PO TABS
2.0000 | ORAL_TABLET | Freq: Every day | ORAL | Status: DC
  Administered 2012-12-15 – 2012-12-16 (×2): 2 via ORAL

## 2012-12-15 MED ORDER — FENTANYL 2.5 MCG/ML BUPIVACAINE 1/10 % EPIDURAL INFUSION (WH - ANES)
INTRAMUSCULAR | Status: DC | PRN
  Administered 2012-12-15: 14 mL/h via EPIDURAL

## 2012-12-15 MED ORDER — OXYCODONE-ACETAMINOPHEN 5-325 MG PO TABS
1.0000 | ORAL_TABLET | ORAL | Status: DC | PRN
  Administered 2012-12-15 – 2012-12-17 (×4): 1 via ORAL
  Filled 2012-12-15 (×4): qty 1

## 2012-12-15 MED ORDER — IBUPROFEN 600 MG PO TABS
600.0000 mg | ORAL_TABLET | Freq: Four times a day (QID) | ORAL | Status: DC
  Administered 2012-12-15 – 2012-12-17 (×6): 600 mg via ORAL
  Filled 2012-12-15 (×6): qty 1

## 2012-12-15 MED ORDER — ONDANSETRON HCL 4 MG PO TABS: 4.0000 mg | ORAL_TABLET | ORAL | Status: DC | PRN

## 2012-12-15 MED ORDER — DIBUCAINE 1 % RE OINT: 1.0000 "application " | TOPICAL_OINTMENT | RECTAL | Status: DC | PRN

## 2012-12-15 MED ORDER — ZOLPIDEM TARTRATE 5 MG PO TABS: 5.0000 mg | ORAL_TABLET | Freq: Every evening | ORAL | Status: DC | PRN

## 2012-12-15 MED ORDER — PHENYLEPHRINE 40 MCG/ML (10ML) SYRINGE FOR IV PUSH (FOR BLOOD PRESSURE SUPPORT)
80.0000 ug | PREFILLED_SYRINGE | INTRAVENOUS | Status: DC | PRN
  Filled 2012-12-15: qty 2

## 2012-12-15 MED ORDER — TERBUTALINE SULFATE 1 MG/ML IJ SOLN: 0.2500 mg | Freq: Once | INTRAMUSCULAR | Status: DC | PRN

## 2012-12-15 MED ORDER — NALBUPHINE SYRINGE 5 MG/0.5 ML
10.0000 mg | INJECTION | Freq: Once | INTRAMUSCULAR | Status: AC
  Administered 2012-12-15: 10 mg via INTRAVENOUS
  Filled 2012-12-15: qty 1

## 2012-12-15 MED ORDER — ONDANSETRON HCL 4 MG/2ML IJ SOLN: 4.0000 mg | INTRAMUSCULAR | Status: DC | PRN

## 2012-12-15 MED ORDER — DIPHENHYDRAMINE HCL 25 MG PO CAPS: 25.0000 mg | ORAL_CAPSULE | Freq: Four times a day (QID) | ORAL | Status: DC | PRN

## 2012-12-15 MED ORDER — FENTANYL 2.5 MCG/ML BUPIVACAINE 1/10 % EPIDURAL INFUSION (WH - ANES)
14.0000 mL/h | INTRAMUSCULAR | Status: DC | PRN
  Administered 2012-12-15: 14 mL/h via EPIDURAL
  Filled 2012-12-15 (×2): qty 125

## 2012-12-15 MED ORDER — EPHEDRINE 5 MG/ML INJ
10.0000 mg | INTRAVENOUS | Status: DC | PRN
  Filled 2012-12-15: qty 2

## 2012-12-15 MED ORDER — OXYTOCIN 40 UNITS IN LACTATED RINGERS INFUSION - SIMPLE MED
1.0000 m[IU]/min | INTRAVENOUS | Status: DC
  Administered 2012-12-15: 2 m[IU]/min via INTRAVENOUS
  Filled 2012-12-15: qty 1000

## 2012-12-15 MED ORDER — WITCH HAZEL-GLYCERIN EX PADS: 1.0000 "application " | MEDICATED_PAD | CUTANEOUS | Status: DC | PRN

## 2012-12-15 MED ORDER — BENZOCAINE-MENTHOL 20-0.5 % EX AERO
1.0000 "application " | INHALATION_SPRAY | CUTANEOUS | Status: DC | PRN
  Administered 2012-12-15: 1 via TOPICAL
  Filled 2012-12-15: qty 56

## 2012-12-15 MED ORDER — SIMETHICONE 80 MG PO CHEW: 80.0000 mg | CHEWABLE_TABLET | ORAL | Status: DC | PRN

## 2012-12-15 NOTE — H&P (Signed)
Jo Jackson is a 42 y.o. female presenting at 70.6 with headache and elevated blood pressures.  Brought in for induction had srom.  postive  GBS.  AMA with normal maternat21. Maternal Medical History:  Reason for admission: Rupture of membranes.   Contractions: Frequency: irregular.   Perceived severity is mild.    Fetal activity: Perceived fetal activity is normal.    Prenatal complications: Elevated bp's  Prenatal Complications - Diabetes: none.    OB History   Grav Para Term Preterm Abortions TAB SAB Ect Mult Living   3 1 1  0 1 0 1 0 0 1     Past Medical History  Diagnosis Date  . ADD (attention deficit disorder)   . Junctional escape rhythm   . Supraventricular tachycardia   . Headache     Migraines  . Anxiety   . Depression   . Pregnancy induced hypertension   . Abnormal Pap smear     Repeats WNL  . Arthritis     Oseteoarthritis  . GERD (gastroesophageal reflux disease)   . Neuromuscular disorder     Fibromyalgia   Past Surgical History  Procedure Laterality Date  . Appendectomy  1986  . Carpal tunnel release  2000  . Cervical fusion  08-24-99,04-26-00,05-09-01  . Shoulder surgery  left-6-04, right 09-12-04  . Tongue polyp  1984  . Ablation of dysrhythmic focus    . Knee sugery Left     16 yrs. old   Family History: family history includes Bipolar disorder in an unspecified family member; Depression in an unspecified family member; Diabetes in an unspecified family member; Hodgkin's lymphoma in her father; Hypertension in an unspecified family member; and Thyroid disease in an unspecified family member. Social History:  reports that she quit smoking about 5 years ago. She does not have any smokeless tobacco history on file. She reports that  drinks alcohol. She reports that she does not use illicit drugs.   Prenatal Transfer Tool  Maternal Diabetes: No Genetic Screening: Normal Maternal Ultrasounds/Referrals: Normal Fetal Ultrasounds or other  Referrals:  None Maternal Substance Abuse:  No Significant Maternal Medications:  None Significant Maternal Lab Results:  Lab values include: Group B Strep positive Other Comments:  None  ROS  Dilation: 5 Effacement (%): 70 Station: -1 Exam by:: dr Arelia Sneddon Blood pressure 134/64, pulse 97, temperature 97.9 F (36.6 C), temperature source Oral, resp. rate 18, height 5\' 7"  (1.702 m), weight 129.457 kg (285 lb 6.4 oz), last menstrual period 03/29/2012, SpO2 99.00%. Maternal Exam:  Uterine Assessment: Contraction strength is moderate.  Contraction frequency is regular.   Abdomen: Patient reports no abdominal tenderness. Fundal height is c/w dates.   Estimated fetal weight is 7.   Fetal presentation: vertex  Introitus: Amniotic fluid character: clear.  Pelvis: adequate for delivery.   Cervix: 5 cm 75 % effacement vts -1  Physical Exam  Prenatal labs: ABO, Rh: --/--/B POS (05/24 2327) Antibody: NEG (05/24 2327) Rubella: Immune (10/30 0000) RPR: NON REACTIVE (05/24 2327)  HBsAg: Negative (10/30 0000)  HIV: Non-reactive (10/30 0000)  GBS: Positive (05/12 0000)   Assessment/Plan: Intrauterine pregnancy at 37.6 with SROM Elevated blood pressuresl Positive GBS Discussed risk of pitocin Antibiotics for GBS   Jo Jackson S 12/15/2012, 8:07 AM

## 2012-12-15 NOTE — Anesthesia Procedure Notes (Signed)
Epidural Patient location during procedure: OB  Staffing Anesthesiologist: Phillips Grout Performed by: anesthesiologist   Preanesthetic Checklist Completed: patient identified, site marked, surgical consent, pre-op evaluation, timeout performed, IV checked, risks and benefits discussed and monitors and equipment checked  Epidural Patient position: sitting Prep: ChloraPrep Patient monitoring: heart rate, continuous pulse ox and blood pressure Approach: midline Injection technique: LOR saline  Needle:  Needle type: Tuohy  Needle gauge: 17 G Needle length: 9 cm and 9 Needle insertion depth: 8 cm Catheter type: closed end flexible Catheter size: 20 Guage Catheter at skin depth: 14 cm Test dose: negative  Assessment Events: blood not aspirated, injection not painful, no injection resistance, negative IV test and no paresthesia  Additional Notes   Patient tolerated the insertion well without complications.

## 2012-12-15 NOTE — Anesthesia Preprocedure Evaluation (Signed)
Anesthesia Evaluation  Patient identified by MRN, date of birth, ID band Patient awake    Reviewed: Allergy & Precautions, H&P , NPO status , Patient's Chart, lab work & pertinent test results  Airway Mallampati: II TM Distance: >3 FB Neck ROM: Full    Dental no notable dental hx.    Pulmonary neg pulmonary ROS, former smoker,  breath sounds clear to auscultation  Pulmonary exam normal       Cardiovascular hypertension, + dysrhythmias Supra Ventricular Tachycardia Rhythm:Regular Rate:Normal     Neuro/Psych negative neurological ROS  negative psych ROS   GI/Hepatic negative GI ROS, Neg liver ROS,   Endo/Other  Morbid obesity  Renal/GU negative Renal ROS  negative genitourinary   Musculoskeletal  (+) Fibromyalgia -  Abdominal   Peds negative pediatric ROS (+)  Hematology negative hematology ROS (+)   Anesthesia Other Findings   Reproductive/Obstetrics negative OB ROS                           Anesthesia Physical Anesthesia Plan  ASA: III  Anesthesia Plan: Epidural   Post-op Pain Management:    Induction:   Airway Management Planned:   Additional Equipment:   Intra-op Plan:   Post-operative Plan:   Informed Consent: I have reviewed the patients History and Physical, chart, labs and discussed the procedure including the risks, benefits and alternatives for the proposed anesthesia with the patient or authorized representative who has indicated his/her understanding and acceptance.     Plan Discussed with:   Anesthesia Plan Comments:         Anesthesia Quick Evaluation

## 2012-12-16 LAB — CBC
HCT: 32.4 % — ABNORMAL LOW (ref 36.0–46.0)
Hemoglobin: 11.1 g/dL — ABNORMAL LOW (ref 12.0–15.0)
MCH: 29.2 pg (ref 26.0–34.0)
MCHC: 34.3 g/dL (ref 30.0–36.0)
MCV: 85.3 fL (ref 78.0–100.0)
Platelets: 153 10*3/uL (ref 150–400)
RBC: 3.8 MIL/uL — ABNORMAL LOW (ref 3.87–5.11)
RDW: 13.2 % (ref 11.5–15.5)
WBC: 8.6 10*3/uL (ref 4.0–10.5)

## 2012-12-16 MED ORDER — FAMOTIDINE 20 MG PO TABS
20.0000 mg | ORAL_TABLET | Freq: Two times a day (BID) | ORAL | Status: DC
  Administered 2012-12-16 – 2012-12-17 (×2): 20 mg via ORAL
  Filled 2012-12-16 (×2): qty 1

## 2012-12-16 NOTE — Progress Notes (Signed)
Patient c/o heartburn and requesting another dose of pepcid. Pepcid only ordered daily. Dr. Rana Snare called and order received. Will continue to closely monitor patient.

## 2012-12-16 NOTE — Anesthesia Postprocedure Evaluation (Signed)
Anesthesia Post Note  Patient: Jo Jackson  Procedure(s) Performed: * No procedures listed *  Anesthesia type: Epidural  Patient location: Mother/Baby  Post pain: Pain level controlled  Post assessment: Post-op Vital signs reviewed  Last Vitals:  Filed Vitals:   12/16/12 0531  BP: 133/79  Pulse: 89  Temp: 36.9 C  Resp: 18    Post vital signs: Reviewed  Level of consciousness: awake  Complications: No apparent anesthesia complications

## 2012-12-16 NOTE — Progress Notes (Signed)
CSW attempted to meet with MOB to complete assessment for hx of Depression, but she was sleeping.  CSW to attempt again at a later time.  CSW spoke to RN, who states no concerns at this time.

## 2012-12-16 NOTE — Progress Notes (Signed)
Post Partum Day 1 Subjective: no complaints, up ad lib and voiding  Objective: Blood pressure 133/79, pulse 89, temperature 98.4 F (36.9 C), temperature source Oral, resp. rate 18, height 5\' 7"  (1.702 m), weight 129.457 kg (285 lb 6.4 oz), last menstrual period 03/29/2012, SpO2 100.00%, unknown if currently breastfeeding.  Physical Exam:  General: alert, cooperative, appears stated age and no distress Lochia: appropriate Uterine Fundus: firm Incision: healing well DVT Evaluation: No evidence of DVT seen on physical exam.   Recent Labs  12/14/12 2327 12/16/12 0620  HGB 12.1 11.1*  HCT 34.2* 32.4*    Assessment/Plan: Plan for discharge tomorrow and Breastfeeding   LOS: 2 days   Arlena Marsan C 12/16/2012, 9:46 AM

## 2012-12-17 MED ORDER — IBUPROFEN 600 MG PO TABS: 600.0000 mg | ORAL_TABLET | Freq: Four times a day (QID) | ORAL | Status: DC

## 2012-12-17 NOTE — Progress Notes (Signed)
CSW unable to meet with MOB prior to discharge.  No concerns stated by bedside RN.   

## 2012-12-17 NOTE — Discharge Summary (Signed)
Obstetric Discharge Summary Reason for Admission: induction of labor Prenatal Procedures: ultrasound Intrapartum Procedures: spontaneous vaginal delivery Postpartum Procedures: none Complications-Operative and Postpartum: 2 degree perineal laceration Hemoglobin  Date Value Range Status  12/16/2012 11.1* 12.0 - 15.0 Jackson/dL Final     HCT  Date Value Range Status  12/16/2012 32.4* 36.0 - 46.0 % Final    Physical Exam:  General: alert and cooperative Lochia: appropriate Uterine Fundus: firm Incision: perineum intact DVT Evaluation: No evidence of DVT seen on physical exam. Negative Homan's sign. No cords or calf tenderness. No significant calf/ankle edema. DTR's 1+ pedal edema noted  Discharge Diagnoses: Term Pregnancy-delivered  Discharge Information: Date: 12/17/2012 Activity: pelvic rest Diet: routine Medications: PNV, Ibuprofen and Percocet Condition: stable Instructions: refer to practice specific booklet Discharge to: home and RTOin 1 week for BP check  Signs and symptoms of PIH reviewed with patient   Newborn Data: Live born female  Birth Weight: 6 lb 6.8 oz (2915 Jackson) APGAR: 8, 9  Home with mother.  Jo Jackson 12/17/2012, 8:19 AM

## 2012-12-25 NOTE — Progress Notes (Signed)
Post discharge ur review completed. 

## 2013-01-28 ENCOUNTER — Encounter: Payer: Self-pay | Admitting: Family Medicine

## 2013-01-28 ENCOUNTER — Ambulatory Visit (INDEPENDENT_AMBULATORY_CARE_PROVIDER_SITE_OTHER): Payer: BC Managed Care – PPO | Admitting: Family Medicine

## 2013-01-28 ENCOUNTER — Telehealth: Payer: Self-pay | Admitting: *Deleted

## 2013-01-28 ENCOUNTER — Other Ambulatory Visit: Payer: Self-pay | Admitting: Obstetrics and Gynecology

## 2013-01-28 VITALS — BP 128/82 | HR 94 | Temp 98.0°F | Wt 278.6 lb

## 2013-01-28 DIAGNOSIS — F32A Depression, unspecified: Secondary | ICD-10-CM

## 2013-01-28 DIAGNOSIS — F3289 Other specified depressive episodes: Secondary | ICD-10-CM

## 2013-01-28 DIAGNOSIS — F329 Major depressive disorder, single episode, unspecified: Secondary | ICD-10-CM

## 2013-01-28 DIAGNOSIS — J019 Acute sinusitis, unspecified: Secondary | ICD-10-CM

## 2013-01-28 DIAGNOSIS — F988 Other specified behavioral and emotional disorders with onset usually occurring in childhood and adolescence: Secondary | ICD-10-CM

## 2013-01-28 MED ORDER — MOMETASONE FUROATE 50 MCG/ACT NA SUSP: 2.0000 | Freq: Every day | NASAL | Status: DC

## 2013-01-28 MED ORDER — VENLAFAXINE HCL ER 37.5 MG PO CP24: 37.5000 mg | ORAL_CAPSULE | Freq: Every day | ORAL | Status: DC

## 2013-01-28 MED ORDER — AMPHETAMINE-DEXTROAMPHETAMINE 30 MG PO TABS: 30.0000 mg | ORAL_TABLET | Freq: Two times a day (BID) | ORAL | Status: DC

## 2013-01-28 MED ORDER — CEFUROXIME AXETIL 500 MG PO TABS: 500.0000 mg | ORAL_TABLET | Freq: Two times a day (BID) | ORAL | Status: AC

## 2013-01-28 NOTE — Patient Instructions (Signed)

## 2013-01-28 NOTE — Progress Notes (Signed)
  Subjective:     Jo Jackson is a 42 y.o. female who presents for evaluation of sinus pain. Symptoms include: congestion, facial pain, headaches, nasal congestion and sinus pressure. Onset of symptoms was 7 days ago. Symptoms have been gradually worsening since that time. Past history is significant for no history of pneumonia or bronchitis. Patient is a former smoker.  The following portions of the patient's history were reviewed and updated as appropriate: allergies, current medications, past family history, past medical history, past social history, past surgical history and problem list.  Review of Systems Pertinent items are noted in HPI.   Objective:    BP 128/82  Pulse 94  Temp(Src) 98 F (36.7 C) (Oral)  Wt 278 lb 9.6 oz (126.372 kg)  BMI 43.62 kg/m2  SpO2 100%  LMP 01/23/2013  Breastfeeding? No General appearance: alert, cooperative, appears stated age and no distress Ears: normal TM's and external ear canals both ears Nose: green discharge, moderate congestion, turbinates pink, swollen, sinus tenderness bilateral Throat: lips, mucosa, and tongue normal; teeth and gums normal Neck: mild anterior cervical adenopathy, supple, symmetrical, trachea midline and thyroid not enlarged, symmetric, no tenderness/mass/nodules Lungs: clear to auscultation bilaterally Heart: S1, S2 normal    Assessment:    Acute bacterial sinusitis.    Plan:    Nasal steroids per medication orders. Antihistamines per medication orders. Ceftin per medication orders. f/u prn

## 2013-01-28 NOTE — Telephone Encounter (Signed)
Please clarify quantity on venlafaxine ER. #60 was given but sig are 1 capsule daily, should she be using one capsule daily or 2 capsules daily.Please advise

## 2013-01-29 MED ORDER — VENLAFAXINE HCL ER 37.5 MG PO CP24: ORAL_CAPSULE | ORAL | Status: DC

## 2013-01-29 NOTE — Telephone Encounter (Signed)
She said she had enough at home to start

## 2013-01-29 NOTE — Telephone Encounter (Signed)
1 a day for 1 week then 2 po qd

## 2013-01-29 NOTE — Telephone Encounter (Signed)
Rx resubmitted with correct sig.

## 2013-01-30 ENCOUNTER — Telehealth: Payer: Self-pay | Admitting: *Deleted

## 2013-01-30 NOTE — Telephone Encounter (Signed)
Prior auth for adderall 30mg  initiated & completed over the phone. Med has been approved 6.10.14 through 7.10.15.  Pt made aware.

## 2013-02-27 ENCOUNTER — Other Ambulatory Visit: Payer: Self-pay | Admitting: Family Medicine

## 2013-04-16 ENCOUNTER — Other Ambulatory Visit: Payer: Self-pay | Admitting: General Practice

## 2013-04-16 DIAGNOSIS — F988 Other specified behavioral and emotional disorders with onset usually occurring in childhood and adolescence: Secondary | ICD-10-CM

## 2013-04-16 MED ORDER — AMPHETAMINE-DEXTROAMPHETAMINE 30 MG PO TABS: 30.0000 mg | ORAL_TABLET | Freq: Two times a day (BID) | ORAL | Status: DC

## 2013-04-16 NOTE — Telephone Encounter (Signed)
amphetamine-dextroamphetamine (ADDERALL) 30 MG tablet Last OV 01-28-13 Med filled 01-28-13 360 with 0 refills

## 2013-04-16 NOTE — Telephone Encounter (Signed)
Med placed for signature

## 2013-04-23 ENCOUNTER — Ambulatory Visit (INDEPENDENT_AMBULATORY_CARE_PROVIDER_SITE_OTHER): Payer: BC Managed Care – PPO

## 2013-04-23 DIAGNOSIS — Z23 Encounter for immunization: Secondary | ICD-10-CM

## 2013-05-12 ENCOUNTER — Other Ambulatory Visit: Payer: Self-pay | Admitting: Family Medicine

## 2013-06-06 ENCOUNTER — Ambulatory Visit (INDEPENDENT_AMBULATORY_CARE_PROVIDER_SITE_OTHER): Payer: BC Managed Care – PPO | Admitting: Family Medicine

## 2013-06-06 ENCOUNTER — Encounter: Payer: Self-pay | Admitting: Family Medicine

## 2013-06-06 VITALS — BP 114/82 | HR 85 | Resp 14 | Wt 285.6 lb

## 2013-06-06 DIAGNOSIS — F988 Other specified behavioral and emotional disorders with onset usually occurring in childhood and adolescence: Secondary | ICD-10-CM

## 2013-06-06 DIAGNOSIS — F341 Dysthymic disorder: Secondary | ICD-10-CM

## 2013-06-06 DIAGNOSIS — M797 Fibromyalgia: Secondary | ICD-10-CM

## 2013-06-06 DIAGNOSIS — IMO0001 Reserved for inherently not codable concepts without codable children: Secondary | ICD-10-CM

## 2013-06-06 DIAGNOSIS — Z1231 Encounter for screening mammogram for malignant neoplasm of breast: Secondary | ICD-10-CM

## 2013-06-06 DIAGNOSIS — Z6841 Body Mass Index (BMI) 40.0 and over, adult: Secondary | ICD-10-CM

## 2013-06-06 LAB — CBC WITH DIFFERENTIAL/PLATELET
Basophils Absolute: 0 10*3/uL (ref 0.0–0.1)
Basophils Relative: 0 % (ref 0–1)
Eosinophils Absolute: 0.1 10*3/uL (ref 0.0–0.7)
Hemoglobin: 13.7 g/dL (ref 12.0–15.0)
MCH: 28.4 pg (ref 26.0–34.0)
MCHC: 35.1 g/dL (ref 30.0–36.0)
Monocytes Relative: 9 % (ref 3–12)
Neutro Abs: 3.9 10*3/uL (ref 1.7–7.7)
Neutrophils Relative %: 65 % (ref 43–77)
Platelets: 250 10*3/uL (ref 150–400)
RDW: 13.5 % (ref 11.5–15.5)

## 2013-06-06 LAB — BASIC METABOLIC PANEL
BUN: 12 mg/dL (ref 6–23)
CO2: 28 mEq/L (ref 19–32)
Calcium: 9 mg/dL (ref 8.4–10.5)
Chloride: 101 mEq/L (ref 96–112)
Creat: 0.68 mg/dL (ref 0.50–1.10)
Glucose, Bld: 99 mg/dL (ref 70–99)

## 2013-06-06 LAB — HEPATIC FUNCTION PANEL
ALT: 18 U/L (ref 0–35)
Albumin: 4 g/dL (ref 3.5–5.2)
Indirect Bilirubin: 0.4 mg/dL (ref 0.0–0.9)
Total Protein: 6.9 g/dL (ref 6.0–8.3)

## 2013-06-06 LAB — HEMOGLOBIN A1C
Hgb A1c MFr Bld: 5.4 % (ref <5.7)
Mean Plasma Glucose: 108 mg/dL (ref <117)

## 2013-06-06 MED ORDER — AMPHETAMINE-DEXTROAMPHETAMINE 30 MG PO TABS: 30.0000 mg | ORAL_TABLET | Freq: Two times a day (BID) | ORAL | Status: DC

## 2013-06-06 MED ORDER — NALTREXONE-BUPROPION HCL ER 8-90 MG PO TB12: ORAL_TABLET | ORAL | Status: DC

## 2013-06-06 MED ORDER — HYDROCODONE-ACETAMINOPHEN 10-325 MG PO TABS: 1.0000 | ORAL_TABLET | Freq: Four times a day (QID) | ORAL | Status: DC | PRN

## 2013-06-06 NOTE — Patient Instructions (Addendum)
rto 1 month for weight check   Fibromyalgia Fibromyalgia is a disorder that is often misunderstood. It is associated with muscular pains and tenderness that comes and goes. It is often associated with fatigue and sleep disturbances. Though it tends to be long-lasting, fibromyalgia is not life-threatening. CAUSES  The exact cause of fibromyalgia is unknown. People with certain gene types are predisposed to developing fibromyalgia and other conditions. Certain factors can play a role as triggers, such as:  Spine disorders.  Arthritis.  Severe injury (trauma) and other physical stressors.  Emotional stressors. SYMPTOMS   The main symptom is pain and stiffness in the muscles and joints, which can vary over time.  Sleep and fatigue problems. Other related symptoms may include:  Bowel and bladder problems.  Headaches.  Visual problems.  Problems with odors and noises.  Depression or mood changes.  Painful periods (dysmenorrhea).  Dryness of the skin or eyes. DIAGNOSIS  There are no specific tests for diagnosing fibromyalgia. Patients can be diagnosed accurately from the specific symptoms they have. The diagnosis is made by determining that nothing else is causing the problems. TREATMENT  There is no cure. Management includes medicines and an active, healthy lifestyle. The goal is to enhance physical fitness, decrease pain, and improve sleep. HOME CARE INSTRUCTIONS   Only take over-the-counter or prescription medicines as directed by your caregiver. Sleeping pills, tranquilizers, and pain medicines may make your problems worse.  Low-impact aerobic exercise is very important and advised for treatment. At first, it may seem to make pain worse. Gradually increasing your tolerance will overcome this feeling.  Learning relaxation techniques and how to control stress will help you. Biofeedback, visual imagery, hypnosis, muscle relaxation, yoga, and meditation are all  options.  Anti-inflammatory medicines and physical therapy may provide short-term help.  Acupuncture or massage treatments may help.  Take muscle relaxant medicines as suggested by your caregiver.  Avoid stressful situations.  Plan a healthy lifestyle. This includes your diet, sleep, rest, exercise, and friends.  Find and practice a hobby you enjoy.  Join a fibromyalgia support group for interaction, ideas, and sharing advice. This may be helpful. SEEK MEDICAL CARE IF:  You are not having good results or improvement from your treatment. FOR MORE INFORMATION  National Fibromyalgia Association: www.fmaware.org Arthritis Foundation: www.arthritis.org Document Released: 07/10/2005 Document Revised: 10/02/2011 Document Reviewed: 10/20/2009 [facility] Patient Information 2014 [place], [place].

## 2013-06-06 NOTE — Progress Notes (Signed)
Pre visit review using our clinic review tool, if applicable. No additional management support is needed unless otherwise documented below in the visit note. 

## 2013-06-07 LAB — T4, FREE: Free T4: 1.42 ng/dL (ref 0.80–1.80)

## 2013-06-07 NOTE — Assessment & Plan Note (Signed)
Stable con't meds 

## 2013-06-07 NOTE — Progress Notes (Signed)
  Subjective:    Patient ID: Jo Jackson, female    DOB: 01-Mar-1971, 42 y.o.   MRN: [id number]  HPI Pt here f/u fibromyalgia, ADD and c/o difficulty losing weight since baby was born. She is not exercising and is trying to follow a diet where she watches what she eats.   No other complaints.     Review of Systems    as above Objective:   Physical Exam  BP 114/82  Pulse 85  Resp 14  Wt 285 lb 9.6 oz (129.547 kg)  SpO2 96%  LMP 05/16/2013  Breastfeeding? No General appearance: alert, cooperative, appears stated age and no distress Ears: normal TM's and external ear canals both ears Nose: Nares normal. Septum midline. Mucosa normal. No drainage or sinus tenderness. Throat: lips, mucosa, and tongue normal; teeth and gums normal Neck: no adenopathy, no carotid bruit, no JVD, supple, symmetrical, trachea midline and thyroid not enlarged, symmetric, no tenderness/mass/nodules Lungs: clear to auscultation bilaterally Heart: S1, S2 normal Extremities: extremities normal, atraumatic, no cyanosis or edema       Assessment & Plan:

## 2013-06-07 NOTE — Assessment & Plan Note (Signed)
contrave starter pack D/w diet and exercise rto 1 month

## 2013-06-07 NOTE — Assessment & Plan Note (Signed)
Refill meds

## 2013-06-09 LAB — ANA: Anti Nuclear Antibody(ANA): NEGATIVE

## 2013-06-11 LAB — VITAMIN D 1,25 DIHYDROXY
Vitamin D 1, 25 (OH)2 Total: 31 pg/mL (ref 18–72)
Vitamin D2 1, 25 (OH)2: <8 pg/mL
Vitamin D3 1, 25 (OH)2: 31 pg/mL

## 2013-06-24 ENCOUNTER — Encounter: Payer: Self-pay | Admitting: Family Medicine

## 2013-06-24 ENCOUNTER — Ambulatory Visit (INDEPENDENT_AMBULATORY_CARE_PROVIDER_SITE_OTHER): Payer: BC Managed Care – PPO | Admitting: Family Medicine

## 2013-06-24 VITALS — BP 118/74 | Temp 98.1°F | Resp 16 | Wt 285.0 lb

## 2013-06-24 DIAGNOSIS — S93409A Sprain of unspecified ligament of unspecified ankle, initial encounter: Secondary | ICD-10-CM

## 2013-06-24 DIAGNOSIS — E669 Obesity, unspecified: Secondary | ICD-10-CM | POA: Insufficient documentation

## 2013-06-24 DIAGNOSIS — S93401A Sprain of unspecified ligament of right ankle, initial encounter: Secondary | ICD-10-CM

## 2013-06-24 NOTE — Progress Notes (Signed)
Pre visit review using our clinic review tool, if applicable. No additional management support is needed unless otherwise documented below in the visit note. 

## 2013-06-24 NOTE — Progress Notes (Signed)
  Subjective:    Jo Jackson is a 42 y.o. female who presents with right ankle pain. Onset of the symptoms was 6 days ago. Inciting event: injured while stepping on a toy. Current symptoms include: ability to bear weight, but with some pain. Aggravating factors: direct pressure, walking  and weight bearing. Symptoms have gradually improved. Patient has had prior ankle problems. Evaluation to date: plain films: normal and er visit out of town. Treatment to date: avoidance of offending activity, brace which is effective, ice, rest and crutches. The following portions of the patient's history were reviewed and updated as appropriate: allergies, current medications, past family history, past medical history, past social history, past surgical history and problem list.    Objective:    BP 118/74  Temp(Src) 98.1 F (36.7 C) (Oral)  Resp 16  Wt 285 lb (129.275 kg)  LMP 05/16/2013 Right ankle:   positive findings: tenderness over lateral malleolus on the right  Left ankle:   normal   Imaging: X-ray of the right ankle(s): no fracture, dislocation, swelling or degenerative changes noted ---per pt   Assessment:    Ankle sprain    Plan:    Rest, ice, compression, elevation (RICE) therapy. con't crutches---to ortho if no improvement

## 2013-06-24 NOTE — Patient Instructions (Signed)

## 2013-06-27 ENCOUNTER — Ambulatory Visit (HOSPITAL_COMMUNITY)
Admission: RE | Admit: 2013-06-27 | Discharge: 2013-06-27 | Disposition: A | Discharge status: Home / Self Care | Payer: BC Managed Care – PPO | Source: Ambulatory Visit | Attending: Family Medicine | Admitting: Family Medicine

## 2013-06-27 ENCOUNTER — Other Ambulatory Visit: Payer: Self-pay | Admitting: Family Medicine

## 2013-06-27 DIAGNOSIS — Z1231 Encounter for screening mammogram for malignant neoplasm of breast: Secondary | ICD-10-CM

## 2013-06-30 ENCOUNTER — Telehealth: Payer: Self-pay | Admitting: *Deleted

## 2013-06-30 DIAGNOSIS — S93401A Sprain of unspecified ligament of right ankle, initial encounter: Secondary | ICD-10-CM

## 2013-06-30 NOTE — Telephone Encounter (Signed)
Ok to referral?

## 2013-06-30 NOTE — Telephone Encounter (Signed)
Patient called and left message on Triage line requesting an ortho referral to Dr. Nolen Mu (office # [phone]) for her ankle. Please advise.

## 2013-06-30 NOTE — Telephone Encounter (Signed)
Seen 12/2. Please advise      KP

## 2013-07-01 ENCOUNTER — Telehealth: Payer: Self-pay | Admitting: Family Medicine

## 2013-07-01 NOTE — Telephone Encounter (Signed)
Please advise      KP 

## 2013-07-01 NOTE — Telephone Encounter (Signed)
Patient is requesting work note for 12/8 and 12/9. Pt will pick up. Call [phone]

## 2013-07-01 NOTE — Telephone Encounter (Signed)
Ok to give note 

## 2013-07-02 ENCOUNTER — Other Ambulatory Visit (HOSPITAL_COMMUNITY): Payer: Self-pay | Admitting: Family Medicine

## 2013-07-02 ENCOUNTER — Ambulatory Visit (HOSPITAL_COMMUNITY)
Admission: RE | Admit: 2013-07-02 | Discharge: 2013-07-02 | Disposition: A | Discharge status: Home / Self Care | Payer: BC Managed Care – PPO | Source: Ambulatory Visit | Attending: Family Medicine | Admitting: Family Medicine

## 2013-07-02 DIAGNOSIS — M25569 Pain in unspecified knee: Secondary | ICD-10-CM | POA: Insufficient documentation

## 2013-07-02 DIAGNOSIS — M25561 Pain in right knee: Secondary | ICD-10-CM

## 2013-07-02 DIAGNOSIS — E669 Obesity, unspecified: Secondary | ICD-10-CM

## 2013-07-02 DIAGNOSIS — M7989 Other specified soft tissue disorders: Secondary | ICD-10-CM

## 2013-07-02 NOTE — Progress Notes (Signed)
Right lower extremity venous duplex completed.  Right:  No evidence of DVT, superficial thrombosis, or Baker's cyst.  Left:  Negative for DVT in the common femoral vein.  

## 2013-07-02 NOTE — Telephone Encounter (Signed)
Letter left at check in and the patient has been made aware.     KP  

## 2013-08-22 ENCOUNTER — Ambulatory Visit (HOSPITAL_COMMUNITY)
Admission: RE | Admit: 2013-08-22 | Discharge: 2013-08-22 | Disposition: A | Discharge status: Home / Self Care | Payer: BC Managed Care – PPO | Source: Ambulatory Visit | Attending: Family Medicine | Admitting: Family Medicine

## 2013-08-22 DIAGNOSIS — Z1231 Encounter for screening mammogram for malignant neoplasm of breast: Secondary | ICD-10-CM

## 2013-08-28 ENCOUNTER — Telehealth: Payer: Self-pay | Admitting: *Deleted

## 2013-08-28 DIAGNOSIS — F4323 Adjustment disorder with mixed anxiety and depressed mood: Secondary | ICD-10-CM

## 2013-08-28 MED ORDER — ALPRAZOLAM 0.25 MG PO TABS: 0.2500 mg | ORAL_TABLET | Freq: Three times a day (TID) | ORAL | Status: DC | PRN

## 2013-08-28 NOTE — Telephone Encounter (Signed)
im so sorry Xanax 0.25 mg 1 po tid prn #30

## 2013-08-28 NOTE — Telephone Encounter (Signed)
Patient called and stated that her mother recently passed away. She states that she has been experiencing panic attacks and anxiety. She would like to know if something can be prescribed for her. Please advise. JG//CMA

## 2013-08-28 NOTE — Telephone Encounter (Signed)
[facility] pt made [facility]

## 2013-09-03 ENCOUNTER — Telehealth: Payer: Self-pay | Admitting: *Deleted

## 2013-09-03 DIAGNOSIS — M797 Fibromyalgia: Secondary | ICD-10-CM

## 2013-09-03 MED ORDER — HYDROCODONE-ACETAMINOPHEN 10-325 MG PO TABS: 1.0000 | ORAL_TABLET | Freq: Four times a day (QID) | ORAL | Status: DC | PRN

## 2013-09-03 NOTE — Telephone Encounter (Signed)
HYDROcodone-acetaminophen (NORCO) 10-325 MG per tablet Last refill: 06/06/13 #30, 0 refills Last OV: 06/24/13 No contract on file

## 2013-09-03 NOTE — Telephone Encounter (Signed)
VM not set up unable to leave a message. My chart message sent.   KP

## 2013-09-03 NOTE — Telephone Encounter (Signed)
Ok to refill x 1  

## 2013-09-26 ENCOUNTER — Encounter: Payer: Self-pay | Admitting: Family Medicine

## 2013-09-26 ENCOUNTER — Ambulatory Visit (INDEPENDENT_AMBULATORY_CARE_PROVIDER_SITE_OTHER): Payer: BC Managed Care – PPO | Admitting: Family Medicine

## 2013-09-26 VITALS — BP 144/90 | HR 95 | Temp 97.9°F | Wt 296.0 lb

## 2013-09-26 DIAGNOSIS — R109 Unspecified abdominal pain: Secondary | ICD-10-CM

## 2013-09-26 DIAGNOSIS — F988 Other specified behavioral and emotional disorders with onset usually occurring in childhood and adolescence: Secondary | ICD-10-CM

## 2013-09-26 DIAGNOSIS — F411 Generalized anxiety disorder: Secondary | ICD-10-CM

## 2013-09-26 DIAGNOSIS — K2270 Barrett's esophagus without dysplasia: Secondary | ICD-10-CM

## 2013-09-26 DIAGNOSIS — G43909 Migraine, unspecified, not intractable, without status migrainosus: Secondary | ICD-10-CM

## 2013-09-26 LAB — CBC WITH DIFFERENTIAL/PLATELET
Basophils Absolute: 0 10*3/uL (ref 0.0–0.1)
Basophils Relative: 0 % (ref 0–1)
Eosinophils Absolute: 0.1 10*3/uL (ref 0.0–0.7)
Eosinophils Relative: 2 % (ref 0–5)
HCT: 39.3 % (ref 36.0–46.0)
Hemoglobin: 13.5 g/dL (ref 12.0–15.0)
LYMPHS ABS: 1 10*3/uL (ref 0.7–4.0)
LYMPHS PCT: 23 % (ref 12–46)
MCH: 28 pg (ref 26.0–34.0)
MCHC: 34.4 g/dL (ref 30.0–36.0)
MCV: 81.5 fL (ref 78.0–100.0)
MONOS PCT: 9 % (ref 3–12)
Monocytes Absolute: 0.4 10*3/uL (ref 0.1–1.0)
NEUTROS PCT: 66 % (ref 43–77)
Neutro Abs: 2.8 10*3/uL (ref 1.7–7.7)
PLATELETS: 229 10*3/uL (ref 150–400)
RBC: 4.82 MIL/uL (ref 3.87–5.11)
RDW: 13.2 % (ref 11.5–15.5)
WBC: 4.2 10*3/uL (ref 4.0–10.5)

## 2013-09-26 LAB — POCT URINALYSIS DIPSTICK
Bilirubin, UA: NEGATIVE
GLUCOSE UA: NEGATIVE
KETONES UA: NEGATIVE
Leukocytes, UA: NEGATIVE
Nitrite, UA: NEGATIVE
Protein, UA: NEGATIVE
SPEC GRAV UA: 1.01
Urobilinogen, UA: 0.2
pH, UA: 7

## 2013-09-26 MED ORDER — OMEPRAZOLE 40 MG PO CPDR: 40.0000 mg | DELAYED_RELEASE_CAPSULE | Freq: Every day | ORAL | Status: DC

## 2013-09-26 MED ORDER — VENLAFAXINE HCL ER 75 MG PO CP24: ORAL_CAPSULE | ORAL | Status: DC

## 2013-09-26 MED ORDER — AMPHETAMINE-DEXTROAMPHETAMINE 30 MG PO TABS: 30.0000 mg | ORAL_TABLET | Freq: Two times a day (BID) | ORAL | Status: DC

## 2013-09-26 MED ORDER — TRAMADOL HCL 50 MG PO TABS: 50.0000 mg | ORAL_TABLET | Freq: Three times a day (TID) | ORAL | Status: DC | PRN

## 2013-09-26 MED ORDER — GI COCKTAIL ~~LOC~~: 30.0000 mL | Freq: Once | ORAL | Status: DC

## 2013-09-26 NOTE — Progress Notes (Signed)
Pre visit review using our clinic review tool, if applicable. No additional management support is needed unless otherwise documented below in the visit note. 

## 2013-09-26 NOTE — Patient Instructions (Signed)
Barrett's Esophagus  Barrett's esophagus occurs when the lining of the esophagus is damaged. The esophagus is the tube that carries food from the mouth to the stomach. With Barrett's esophagus, the lining of the esophagus gets replaced by material that is similar to the lining in the intestines. This process is called intestinal metaplasia. A small number of people with Barrett's esophagus develop esophageal cancer.  CAUSES   The exact cause of Barrett's esophagus is unknown.  SYMPTOMS   Most people with Barrett's esophagus do not have symptoms. However, many patients also have gastroesophageal reflux disease (GERD). GERD can cause heartburn, trouble swallowing, and a dry cough.  DIAGNOSIS  Barrett's esophagus is diagnosed by an exam called upper gastrointestinal endoscopy. A thin, flexible tube (endoscope) is passed down the esophagus. The endoscope has a light and camera on the end. Your caregiver uses the endoscope to view the inside of the esophagus. A tissue sample may also be taken and examined under a microscope (biopsy). If cancer cells are found during the biopsy, this condition is called dysplasia.  TREATMENT   If you have no dysplasia or low-grade dysplasia, your caregiver may recommend no treatment or only taking medicines to treat GERD. Sometimes, taking acid-blocking drugs to treat GERD helps improve the tissue affected by Barrett's esophagus. Your caregiver may also recommend periodic esophageal exams.  If you have high-grade dysplasia, treatment may include removing the damaged parts of the esophagus. This can be done by heating, freezing, or surgically removing the tissue. In some cases, surgery may be done to remove most of the esophagus. The stomach is then attached to the remaining portion of the esophagus.  HOME CARE INSTRUCTIONS  · Take acid-blocking drugs for GERD if recommended by your caregiver.  · Keep all follow-up appointments as directed by your caregiver. You may need periodic  esophageal exams.  SEEK IMMEDIATE MEDICAL CARE IF:  · You have chest pain.  · You have trouble swallowing.  · You vomit blood or material that looks like coffee grounds.  · Your stools are bright red or dark.  Document Released: 09/30/2003 Document Revised: 01/09/2012 Document Reviewed: 09/19/2011  [employer]® Patient Information ©2014 [employer].

## 2013-09-26 NOTE — Progress Notes (Signed)
  Subjective:    Jo LoboMichele Jackson is a 43 y.o. female who presents for follow-up of migraine headaches. Home treatment has included excedrin/ nsaids with some improvement. Headaches are occurring every day. Generally, the headaches last about several hours. Work attendance or other daily activities are not affected by the headaches. The patient denies depression, dizziness, loss of balance, muscle weakness, numbness of extremities, speech difficulties, vision problems, vomiting in the early morning and worsening school/work performance.  The following portions of the patient's history were reviewed and updated as appropriate: allergies, current medications, past family history, past medical history, past social history, past surgical history and problem list.  Review of Systems Pertinent items are noted in HPI.    Objective:    BP 144/90  Pulse 95  Temp(Src) 97.9 F (36.6 C) (Oral)  Wt 296 lb (134.265 kg)  SpO2 98% General appearance: alert, cooperative, appears stated age and no distress Eyes: conjunctivae/corneas clear. PERRL, EOM's intact. Fundi benign. Ears: normal TM's and external ear canals both ears Nose: Nares normal. Septum midline. Mucosa normal. No drainage or sinus tenderness. Throat: lips, mucosa, and tongue normal; teeth and gums normal Neck: no adenopathy, no carotid bruit, no JVD, supple, symmetrical, trachea midline and thyroid not enlarged, symmetric, no tenderness/mass/nodules Lungs: clear to auscultation bilaterally Heart: regular rate and rhythm, S1, S2 normal, no murmur, click, rub or gallop Abdomen: soft, non-tender; bowel sounds normal; no masses,  no organomegaly Lymph nodes: Cervical, supraclavicular, and axillary nodes normal.    Assessment:    Classic migraine    Plan:    Continue present treatment and plan. Lie in darkened room and apply cold packs as needed for pain. Episodic therapy: ultram due to low frequency of pain.   1. Abdominal pain,  unspecified site Omeprazole per orders - POCT urinalysis dipstick - POCT urine pregnancy - Ambulatory referral to Gastroenterology - omeprazole (PRILOSEC) 40 MG capsule; Take 1 capsule (40 mg total) by mouth daily.  Dispense: 30 capsule; Refill: 3 - Basic Metabolic Panel (BMET) - CBC w/Diff - H. pylori antibody, IgG - Hepatic function panel - gi cocktail (Maalox,Lidocaine,Donnatal); Take 30 mLs by mouth once. - POCT Urinalysis Dipstick  2. ADD (attention deficit disorder) Refill meds - amphetamine-dextroamphetamine (ADDERALL) 30 MG tablet; Take 1 tablet (30 mg total) by mouth 2 (two) times daily.  Dispense: 180 tablet; Refill: 0 - Basic Metabolic Panel (BMET) - CBC w/Diff - H. pylori antibody, IgG - Hepatic function panel  3. Generalized anxiety disorder Refill meds - venlafaxine XR (EFFEXOR-XR) 75 MG 24 hr capsule; TAKE 1 CAPSULE BY MOUTH DAILY  Dispense: 90 capsule; Refill: 3 - Basic Metabolic Panel (BMET) - CBC w/Diff - H. pylori antibody, IgG - Hepatic function panel  4. Barrett's esophagus F/u GI - Ambulatory referral to Gastroenterology - omeprazole (PRILOSEC) 40 MG capsule; Take 1 capsule (40 mg total) by mouth daily.  Dispense: 30 capsule; Refill: 3 - Basic Metabolic Panel (BMET) - CBC w/Diff - H. pylori antibody, IgG - Hepatic function panel  5. Migraine  - traMADol (ULTRAM) 50 MG tablet; Take 1 tablet (50 mg total) by mouth every 8 (eight) hours as needed.  Dispense: 30 tablet; Refill: 0 - Basic Metabolic Panel (BMET) - CBC w/Diff - H. pylori antibody, IgG - Hepatic function panel

## 2013-09-27 LAB — BASIC METABOLIC PANEL
BUN: 10 mg/dL (ref 6–23)
CO2: 28 mEq/L (ref 19–32)
Calcium: 8.7 mg/dL (ref 8.4–10.5)
Chloride: 102 mEq/L (ref 96–112)
Creat: 0.64 mg/dL (ref 0.50–1.10)
Glucose, Bld: 85 mg/dL (ref 70–99)
POTASSIUM: 4.1 meq/L (ref 3.5–5.3)
Sodium: 139 mEq/L (ref 135–145)

## 2013-09-27 LAB — HEPATIC FUNCTION PANEL
ALT: 25 U/L (ref 0–35)
AST: 20 U/L (ref 0–37)
Albumin: 3.7 g/dL (ref 3.5–5.2)
Alkaline Phosphatase: 76 U/L (ref 39–117)
BILIRUBIN DIRECT: 0.1 mg/dL (ref 0.0–0.3)
BILIRUBIN INDIRECT: 0.5 mg/dL (ref 0.2–1.2)
Total Bilirubin: 0.6 mg/dL (ref 0.2–1.2)
Total Protein: 6.4 g/dL (ref 6.0–8.3)

## 2013-09-29 ENCOUNTER — Encounter: Payer: Self-pay | Admitting: Gastroenterology

## 2013-09-29 LAB — H. PYLORI ANTIBODY, IGG: H PYLORI IGG: 0.69 {ISR}

## 2013-11-03 ENCOUNTER — Other Ambulatory Visit: Payer: Self-pay | Admitting: Family Medicine

## 2013-11-03 DIAGNOSIS — R109 Unspecified abdominal pain: Secondary | ICD-10-CM

## 2013-11-03 DIAGNOSIS — M797 Fibromyalgia: Secondary | ICD-10-CM

## 2013-11-03 DIAGNOSIS — F411 Generalized anxiety disorder: Secondary | ICD-10-CM

## 2013-11-03 DIAGNOSIS — K2270 Barrett's esophagus without dysplasia: Secondary | ICD-10-CM

## 2013-11-03 MED ORDER — HYDROCODONE-ACETAMINOPHEN 10-325 MG PO TABS: 1.0000 | ORAL_TABLET | Freq: Four times a day (QID) | ORAL | Status: DC | PRN

## 2013-11-03 MED ORDER — OMEPRAZOLE 40 MG PO CPDR: 40.0000 mg | DELAYED_RELEASE_CAPSULE | Freq: Every day | ORAL | Status: DC

## 2013-11-03 MED ORDER — VENLAFAXINE HCL ER 75 MG PO CP24: ORAL_CAPSULE | ORAL | Status: DC

## 2013-11-03 NOTE — Telephone Encounter (Signed)
Last seen 09/26/13 and filled 09/03/13 #30.  No UDS and No contract on file. Please advise     KP

## 2013-11-10 ENCOUNTER — Ambulatory Visit (INDEPENDENT_AMBULATORY_CARE_PROVIDER_SITE_OTHER): Payer: BC Managed Care – PPO | Admitting: Gastroenterology

## 2013-11-10 ENCOUNTER — Encounter: Payer: Self-pay | Admitting: Gastroenterology

## 2013-11-10 VITALS — BP 122/78 | HR 68 | Ht 67.0 in | Wt 278.0 lb

## 2013-11-10 DIAGNOSIS — R1310 Dysphagia, unspecified: Secondary | ICD-10-CM

## 2013-11-10 DIAGNOSIS — K219 Gastro-esophageal reflux disease without esophagitis: Secondary | ICD-10-CM

## 2013-11-10 NOTE — Patient Instructions (Signed)
You will be set up for an upper endoscopy for GERD, dysphagia (dilate esophagus if needed). Stay on omeprazole, best taken 20-30 min prior to BF meal.

## 2013-11-10 NOTE — Progress Notes (Signed)
HPI: This is a   very pleasant 43 year old woman whom I am meeting for the first time today.  Has had pyrosis.  For many years.  Certain foods contribute (spicy, caffeine). Shes taken zantac several pills per day.  Has been on nexium, protonix.  Omeprazole (one pill once daily).  If she takes it regularly, her pyrosis is well controlled.  She had EGD 13 years. Pathology 2002 EGD ([facility]) did NOT show IM (no barrett's).  I could not see Egd report during this visit   Labs 09/2013 CMET, LFTs, cbc, h. Pylori serologies were all normal  Overall her weight is down in past month, dietary changes.  Over past year, weight up since she delivered baby (girl, almost a year).  HAs required advil, alleve for migraines.  She will take 4 advil per week.  Solid dysphagia for years,       Review of systems: Pertinent positive and negative review of systems were noted in the above HPI section. Complete review of systems was performed and was otherwise normal.    Past Medical History  Diagnosis Date  . ADD (attention deficit disorder)   . Junctional escape rhythm   . Supraventricular tachycardia   . Headache(784.0)     Migraines  . Anxiety   . Depression   . Pregnancy induced hypertension   . Abnormal Pap smear     Repeats WNL  . Arthritis     Oseteoarthritis  . GERD (gastroesophageal reflux disease)   . Neuromuscular disorder     Fibromyalgia    Past Surgical History  Procedure Laterality Date  . Appendectomy  1986  . Carpal tunnel release  2000  . Cervical fusion  08-24-99,04-26-00,05-09-01  . Shoulder surgery  left-6-04, right 09-12-04  . Tongue polyp  1984  . Ablation of dysrhythmic focus    . Knee sugery Left     16 yrs. old    Current Outpatient Prescriptions  Medication Sig Dispense Refill  . ALPRAZolam (XANAX) 0.25 MG tablet Take 1 tablet (0.25 mg total) by mouth 3 (three) times daily as needed for anxiety.  30 tablet  0  . amphetamine-dextroamphetamine (ADDERALL) 30 MG  tablet Take 1 tablet (30 mg total) by mouth 2 (two) times daily.  180 tablet  0  . HYDROcodone-acetaminophen (NORCO) 10-325 MG per tablet Take 1 tablet by mouth every 6 (six) hours as needed.  30 tablet  0  . mometasone (NASONEX) 50 MCG/ACT nasal spray Place 2 sprays into the nose daily.  17 g  12  . omeprazole (PRILOSEC) 40 MG capsule Take 1 capsule (40 mg total) by mouth daily.  90 capsule  1  . traMADol (ULTRAM) 50 MG tablet Take 1 tablet (50 mg total) by mouth every 8 (eight) hours as needed.  30 tablet  0  . venlafaxine XR (EFFEXOR-XR) 75 MG 24 hr capsule TAKE 1 CAPSULE BY MOUTH DAILY  90 capsule  1   No current facility-administered medications for this visit.    Allergies as of 11/10/2013 - Review Complete 11/10/2013  Allergen Reaction Noted  . Ketorolac tromethamine Hives 01/31/2007  . Morphine Other (See Comments)   . Latex Rash and Other (See Comments) 01/31/2007    Family History  Problem Relation Age of Onset  . Diabetes    . Thyroid disease    . Hypertension    . Hodgkin's lymphoma Father   . Depression    . Bipolar disorder      History   Social History  .  Marital Status: Married    Spouse Name: N/A    Number of Children: 2  . Years of Education: N/A   Occupational History  .  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 [employer]    Social History Main Topics  . Smoking status: Former Smoker    Quit date: 10/23/2007  . Smokeless tobacco: Never Used  . Alcohol Use: Yes  . Drug Use: No  . Sexual Activity: Yes   Other Topics Concern  . Not on file   Social History Narrative   G1p1       Physical Exam: BP 122/78  Pulse 68  Ht 5\' 7"  (1.702 m)  Wt 278 lb (126.1 kg)  BMI 43.53 kg/m2  LMP 10/24/2013 Constitutional: generally well-appearing Psychiatric: alert and oriented x3 Eyes: extraocular movements intact Mouth: oral pharynx moist, no lesions Neck: supple no lymphadenopathy Cardiovascular: heart regular rate and rhythm Lungs: clear to auscultation  bilaterally Abdomen: soft, nontender, nondistended, no obvious ascites, no peritoneal signs, normal bowel sounds Extremities: no lower extremity edema bilaterally Skin: no lesions on visible extremities    Assessment and plan: 43 y.o. female with  chronic GERD, intermittent solid food dysphagia  She was under the impression that she had Barrett's esophagus however I reviewed her pathology from EGD about 13 years ago and it clearly stated she did not have intestinal metaplasia. I suspect the confusion is that she understood biopsies were done to rule out Barrett's and fat term stuck in her mind. I could not view her previous egdreport. Since her heartburn has worsened and she has intermittent dysphasia I would like to repeat her EGD and I can certainly evaluate her for Barrett's changes, repeat biopsies if needed, proceed with esophageal dilation if a stricture is noted. She will continue on proton pump inhibitor once daily for.

## 2013-11-12 ENCOUNTER — Encounter: Payer: Self-pay | Admitting: Gastroenterology

## 2013-11-12 ENCOUNTER — Ambulatory Visit (AMBULATORY_SURGERY_CENTER): Payer: BC Managed Care – PPO | Admitting: Gastroenterology

## 2013-11-12 VITALS — BP 131/79 | HR 73 | Temp 97.9°F | Resp 17 | Ht 67.0 in | Wt 278.0 lb

## 2013-11-12 DIAGNOSIS — R1310 Dysphagia, unspecified: Secondary | ICD-10-CM

## 2013-11-12 DIAGNOSIS — K2210 Ulcer of esophagus without bleeding: Secondary | ICD-10-CM

## 2013-11-12 DIAGNOSIS — K219 Gastro-esophageal reflux disease without esophagitis: Secondary | ICD-10-CM

## 2013-11-12 DIAGNOSIS — R109 Unspecified abdominal pain: Secondary | ICD-10-CM

## 2013-11-12 MED ORDER — SODIUM CHLORIDE 0.9 % IV SOLN: 500.0000 mL | INTRAVENOUS | Status: DC

## 2013-11-12 MED ORDER — OMEPRAZOLE 40 MG PO CPDR: 40.0000 mg | DELAYED_RELEASE_CAPSULE | Freq: Two times a day (BID) | ORAL | Status: DC

## 2013-11-12 NOTE — Op Note (Addendum)
Jo Jackson [address] [place], [id number]   ENDOSCOPY PROCEDURE REPORT  PATIENT: Jo Jackson, Jo Jackson  MR#: [id number] BIRTHDATE: 17-May-1971 , 42  yrs. old GENDER: Female ENDOSCOPIST: Rachael Feeaniel P Jacobs, MD REFERRED BY:  Loreen FreudYvonne Lowne, DO PROCEDURE DATE:  11/12/2013 PROCEDURE:  EGD, with balloon dilation ASA CLASS:     Class II INDICATIONS:  intermittent dysphagia, no GERD. MEDICATIONS: MAC sedation, administered by CRNA and propofol (Diprivan) 400mg  IV TOPICAL ANESTHETIC: none  DESCRIPTION OF PROCEDURE: After the risks benefits and alternatives of the procedure were thoroughly explained, informed consent was obtained.  The LB [id number] endoscope was introduced through the mouth and advanced to the second portion of the duodenum. Without limitations.  The instrument was slowly withdrawn as the mucosa was fully examined.     There was reflux related erosive esophagitis.  Several spokes, 2cm long.  There was mild narrowing at the GE junction.  There were no Barrett's changes.  The GE junction was dilated up to 20mm using a CRE TTS balloon held inflated for 60 seconds.  The examination was otherwise normal.  Retroflexed views revealed no abnormalities. The scope was then withdrawn from the patient and the procedure completed.  COMPLICATIONS: There were no complications. ENDOSCOPIC IMPRESSION: There was reflux related erosive esophagitis.  Several spokes, 2cm long.  There was mild narrowing at the GE junction.  There were no Barrett's changes.  The GE junction was dilated up to 20mm using a CRE TTS balloon held inflated for 60 seconds.  The examination was otherwise normal.  RECOMMENDATIONS: You should increase your omeprazole to one pill twice daily (best taken 20-30 min before BF and dinner meals) to help with acid related damage to your esophagus.  A new prescription was called in today.   eSigned:  Rachael Feeaniel P Jacobs, MD 11/12/2013 2:44  PM

## 2013-11-12 NOTE — Progress Notes (Signed)
Lidocaine-40mg IV prior to Propofol InductionPropofol given over incremental dosages 

## 2013-11-12 NOTE — Progress Notes (Signed)
Called to room to assist during endoscopic procedure.  Patient ID and intended procedure confirmed with present staff. Received instructions for my participation in the procedure from the performing physician.  

## 2013-11-12 NOTE — Progress Notes (Signed)
Pt. C/o pain of level 3 in shoulder and shoulder blades, denies any other pain, no chest,back pain. Dr. Christella HartiganJacobs in to evaluate and stated that she said her right shoulder is a little sore and it's o.k. To let pt. Go home. When reevaluated pt. She stated that she was o.k. " I'M fine".

## 2013-11-12 NOTE — Patient Instructions (Signed)
YOU HAD AN ENDOSCOPIC PROCEDURE TODAY AT THE [facility]: Refer to the procedure report that was given to you for any specific questions about what was found during the examination.  If the procedure report does not answer your questions, please call your gastroenterologist to clarify.  If you requested that your care partner not be given the details of your procedure findings, then the procedure report has been included in a sealed envelope for you to review at your convenience later.  YOU SHOULD EXPECT: Some feelings of bloating in the abdomen. Passage of more gas than usual.  Walking can help get rid of the air that was put into your GI tract during the procedure and reduce the bloating. If you had a lower endoscopy (such as a colonoscopy or flexible sigmoidoscopy) you may notice spotting of blood in your stool or on the toilet paper. If you underwent a bowel prep for your procedure, then you may not have a normal bowel movement for a few days.  DIET: FOLLOW DILATION HANDOUT.  ACTIVITY: Your care partner should take you home directly after the procedure.  You should plan to take it easy, moving slowly for the rest of the day.  You can resume normal activity the day after the procedure however you should NOT DRIVE or use heavy machinery for 24 hours (because of the sedation medicines used during the test).    SYMPTOMS TO REPORT IMMEDIATELY: A gastroenterologist can be reached at any hour.  During normal business hours, 8:30 AM to 5:00 PM Monday through Friday, call [phone].  After hours and on weekends, please call the GI answering service at [phone] who will take a message and have the physician on call contact you.   Following upper endoscopy (EGD)  Vomiting of blood or coffee ground material  New chest pain or pain under the shoulder blades  Painful or persistently difficult swallowing  New shortness of breath  Fever of 100F or higher  Black, tarry-looking  stools  FOLLOW UP: If any biopsies were taken you will be contacted by phone or by letter within the next 1-3 weeks.  Call your gastroenterologist if you have not heard about the biopsies in 3 weeks.  Our staff will call the home number listed on your records the next business day following your procedure to check on you and address any questions or concerns that you may have at that time regarding the information given to you following your procedure. This is a courtesy call and so if there is no answer at the home number and we have not heard from you through the emergency physician on call, we will assume that you have returned to your regular daily activities without incident.  SIGNATURES/CONFIDENTIALITY: You and/or your care partner have signed paperwork which will be entered into your electronic medical record.  These signatures attest to the fact that that the information above on your After Visit Summary has been reviewed and is understood.  Full responsibility of the confidentiality of this discharge information lies with you and/or your care-partner.  Resume medication Information given on Dilation Diet and Esophagitis with discharge instructions.

## 2013-11-13 ENCOUNTER — Telehealth: Payer: Self-pay | Admitting: *Deleted

## 2013-11-13 NOTE — Telephone Encounter (Signed)
  Follow up Call-  Call back number 11/12/2013  Post procedure Call Back phone  # [phone]  Permission to leave phone message No  comments no voicemail     Patient questions:  Do you have a fever, pain , or abdominal swelling? no Pain Score  0   Have you tolerated food without any problems? yes  Have you been able to return to your normal activities? yes  Do you have any questions about your discharge instructions: Diet   no Medications  no Follow up visit  no  Do you have questions or concerns about your Care? no    Pt does state she thinks the anesthesia gave her a migraine yesterday and she had to take "A lot of stuff to get rid of it.  Says she is fine now.  Instructed her to give us a call back if she experiences any more pain.

## 2014-01-01 ENCOUNTER — Other Ambulatory Visit: Payer: Self-pay | Admitting: Family Medicine

## 2014-01-01 DIAGNOSIS — F988 Other specified behavioral and emotional disorders with onset usually occurring in childhood and adolescence: Secondary | ICD-10-CM

## 2014-01-01 DIAGNOSIS — G43909 Migraine, unspecified, not intractable, without status migrainosus: Secondary | ICD-10-CM

## 2014-01-01 DIAGNOSIS — M797 Fibromyalgia: Secondary | ICD-10-CM

## 2014-01-02 MED ORDER — TRAMADOL HCL 50 MG PO TABS: 50.0000 mg | ORAL_TABLET | Freq: Three times a day (TID) | ORAL | Status: DC | PRN

## 2014-01-02 MED ORDER — HYDROCODONE-ACETAMINOPHEN 10-325 MG PO TABS: 1.0000 | ORAL_TABLET | Freq: Four times a day (QID) | ORAL | Status: DC | PRN

## 2014-01-02 MED ORDER — AMPHETAMINE-DEXTROAMPHETAMINE 30 MG PO TABS: 30.0000 mg | ORAL_TABLET | Freq: Two times a day (BID) | ORAL | Status: DC

## 2014-01-02 NOTE — Telephone Encounter (Signed)
Last OV 09-26-13 Alprazolam last filled 10-05-13 #180 with 0 Hydrocodone last filled 11-03-13 #30 with 0 Tramadol last filled 09-26-13 #30 with 0  NO CSC or UDS

## 2014-04-03 ENCOUNTER — Other Ambulatory Visit: Payer: Self-pay | Admitting: Family Medicine

## 2014-04-03 DIAGNOSIS — G43909 Migraine, unspecified, not intractable, without status migrainosus: Secondary | ICD-10-CM

## 2014-04-03 DIAGNOSIS — F988 Other specified behavioral and emotional disorders with onset usually occurring in childhood and adolescence: Secondary | ICD-10-CM

## 2014-04-03 DIAGNOSIS — M797 Fibromyalgia: Secondary | ICD-10-CM

## 2014-04-03 MED ORDER — HYDROCODONE-ACETAMINOPHEN 10-325 MG PO TABS: 1.0000 | ORAL_TABLET | Freq: Four times a day (QID) | ORAL | Status: DC | PRN

## 2014-04-03 MED ORDER — AMPHETAMINE-DEXTROAMPHETAMINE 30 MG PO TABS: 30.0000 mg | ORAL_TABLET | Freq: Two times a day (BID) | ORAL | Status: DC

## 2014-04-03 MED ORDER — TRAMADOL HCL 50 MG PO TABS: 50.0000 mg | ORAL_TABLET | Freq: Three times a day (TID) | ORAL | Status: DC | PRN

## 2014-04-03 NOTE — Telephone Encounter (Signed)
Last seen 09/26/13 and filled tramadol and [facility] 01/02/14

## 2014-04-06 ENCOUNTER — Encounter: Payer: Self-pay | Admitting: Family Medicine

## 2014-04-13 ENCOUNTER — Telehealth: Payer: Self-pay | Admitting: *Deleted

## 2014-04-13 ENCOUNTER — Ambulatory Visit: Payer: BC Managed Care – PPO | Admitting: Family Medicine

## 2014-04-13 NOTE — Telephone Encounter (Signed)
Please call pt--- not like her to no show

## 2014-04-13 NOTE — Telephone Encounter (Signed)
Pt did not show for appointment 04/13/2014 at 2:30pm for medication follow up and migraines.

## 2014-04-14 NOTE — Telephone Encounter (Addendum)
Spoke with patient. She was very upset that she did not receive a phone reminder regarding her appointment on 04/13/14.  Apologies were offered.  She wants to know will she be charged?  Please advise.  She stated that from 9/13-9/19 experienced migraine headaches.  She was treating them with hydrocodone and tramadol with relief.  She denies a migraine today, but would like to see a provider regarding her headaches to see what she suggest she do.  She feels that her migraines may be hormonal, but she is unsure. Wonders if she can be place on a maintainence drug such as Imitrex.  She stated that she would only be able to come in on a Friday and she will only see Dr. Laury Axon or Dr. Beverely Low.  The earliest appointment was with Dr. Beverely Low on Friday, 04/17/14 @ 1:15 pm (Acute appointment-Migraine HA).  Appointment scheduled.  Medication Follow up appointment with Dr. Laury Axon was also scheduled for Friday, 05/22/14 @ 1:15 pm.

## 2014-04-14 NOTE — Telephone Encounter (Signed)
Unable to reach patient or leave a voice message.   

## 2014-04-14 NOTE — Telephone Encounter (Signed)
No charge. 

## 2014-04-15 NOTE — Telephone Encounter (Signed)
See note below

## 2014-04-17 ENCOUNTER — Ambulatory Visit (INDEPENDENT_AMBULATORY_CARE_PROVIDER_SITE_OTHER): Payer: BC Managed Care – PPO | Admitting: Family Medicine

## 2014-04-17 ENCOUNTER — Encounter: Payer: Self-pay | Admitting: Family Medicine

## 2014-04-17 VITALS — BP 132/82 | HR 82 | Temp 98.1°F | Resp 16 | Wt 278.1 lb

## 2014-04-17 DIAGNOSIS — Z3042 Encounter for surveillance of injectable contraceptive: Secondary | ICD-10-CM

## 2014-04-17 DIAGNOSIS — Z6841 Body Mass Index (BMI) 40.0 and over, adult: Secondary | ICD-10-CM

## 2014-04-17 DIAGNOSIS — Z3049 Encounter for surveillance of other contraceptives: Secondary | ICD-10-CM

## 2014-04-17 DIAGNOSIS — F341 Dysthymic disorder: Secondary | ICD-10-CM

## 2014-04-17 DIAGNOSIS — G43009 Migraine without aura, not intractable, without status migrainosus: Secondary | ICD-10-CM

## 2014-04-17 MED ORDER — MEDROXYPROGESTERONE ACETATE 150 MG/ML IM SUSP
150.0000 mg | Freq: Once | INTRAMUSCULAR | Status: AC
  Administered 2014-04-17: 150 mg via INTRAMUSCULAR

## 2014-04-17 MED ORDER — SUMATRIPTAN SUCCINATE 50 MG PO TABS: 50.0000 mg | ORAL_TABLET | Freq: Once | ORAL | Status: DC

## 2014-04-17 NOTE — Progress Notes (Signed)
   Subjective:    Patient ID: Jo Jackson, female    DOB: 1971-07-10, 43 y.o.   MRN: [id number]  HPI HAs- pt reports she had a '6 day migraine' followed by 2 days of diarrhea, followed by menses.  Has been on birth control previously- Depo.  Pt quit smoking.  Pt reports periods were better, HAs were better, mood swings were better on Depo.  Pt admits to irregular SNRI use.  Pt reports period is over so she is currently asymptomatic.  Pt asking to restart Depo and have Imitrex prn.  No current HA, visual changes, N/V, edema.   Review of Systems For ROS see HPI     Objective:   Physical Exam  Vitals reviewed. Constitutional: She is oriented to person, place, and time. She appears well-developed and well-nourished. No distress.  obese  HENT:  Head: Normocephalic and atraumatic.  TMs WNL No TTP over sinuses Minimal nasal congestion  Eyes: Conjunctivae and EOM are normal. Pupils are equal, round, and reactive to light.  Neck: Normal range of motion. Neck supple.  Cardiovascular: Normal rate, regular rhythm, normal heart sounds and intact distal pulses.   Pulmonary/Chest: Effort normal and breath sounds normal. No respiratory distress. She has no wheezes. She has no rales.  Lymphadenopathy:    She has no cervical adenopathy.  Neurological: She is alert and oriented to person, place, and time. She has normal reflexes. No cranial nerve deficit. Coordination normal.  Psychiatric: She has a normal mood and affect. Her behavior is normal. Judgment and thought content normal.          Assessment & Plan:

## 2014-04-17 NOTE — Progress Notes (Signed)
Pre visit review using our clinic review tool, if applicable. No additional management support is needed unless otherwise documented below in the visit note. 

## 2014-04-17 NOTE — Patient Instructions (Signed)
Follow up w/ Dr Laury Axon as needed Your next Depo is due  Use the Imitrex as needed for migraine Take your Venlafaxine daily Try and get regular exercise and make healthy food choices Start a daily Ca and Vit D supplement (Caltrate) Call with any questions or concerns Hang in there!!

## 2014-04-18 NOTE — Assessment & Plan Note (Signed)
Pt continues to gain weight.  Reports she was '185 lbs' while on Depo.  Discussed that Depo frequently causes weight gain- pt disagrees.  Will restart med at pt's request.

## 2014-04-18 NOTE — Assessment & Plan Note (Signed)
Deteriorated.  Pt admits to not taking SNRI regularly.  Discussed that this could contribute to her HA and her depressed mood.  Pt feels strongly that her mood was more even keel while on Depo.  Will resume at pt's request today.

## 2014-04-18 NOTE — Assessment & Plan Note (Signed)
Deteriorated.  Pt reports that her HAs preceding menses are 'worse than ever'.  Did not have this problem while on Depo.  Feels strongly that restarting Depo would improve sxs.  Will give Imitrex to have on hand in case of migraine.  Reviewed supportive care and red flags that should prompt return.  Pt expressed understanding and is in agreement w/ plan.

## 2014-05-11 ENCOUNTER — Other Ambulatory Visit: Payer: Self-pay

## 2014-05-11 DIAGNOSIS — F411 Generalized anxiety disorder: Secondary | ICD-10-CM

## 2014-05-11 DIAGNOSIS — K219 Gastro-esophageal reflux disease without esophagitis: Secondary | ICD-10-CM

## 2014-05-11 MED ORDER — VENLAFAXINE HCL ER 75 MG PO CP24: ORAL_CAPSULE | ORAL | Status: DC

## 2014-05-11 NOTE — Telephone Encounter (Signed)
omeprazole refill request. Rx was sent by GI on 11/15/13 #90 with 3 refills. Rx was not sent.     KP

## 2014-05-22 ENCOUNTER — Encounter: Payer: Self-pay | Admitting: Family Medicine

## 2014-05-22 ENCOUNTER — Ambulatory Visit (INDEPENDENT_AMBULATORY_CARE_PROVIDER_SITE_OTHER): Payer: BC Managed Care – PPO | Admitting: Family Medicine

## 2014-05-22 VITALS — BP 120/77 | HR 82 | Temp 98.1°F | Wt 275.0 lb

## 2014-05-22 DIAGNOSIS — J208 Acute bronchitis due to other specified organisms: Secondary | ICD-10-CM

## 2014-05-22 MED ORDER — AZITHROMYCIN 250 MG PO TABS: ORAL_TABLET | ORAL | Status: DC

## 2014-05-22 MED ORDER — ALBUTEROL SULFATE HFA 108 (90 BASE) MCG/ACT IN AERS: 2.0000 | INHALATION_SPRAY | Freq: Four times a day (QID) | RESPIRATORY_TRACT | Status: DC | PRN

## 2014-05-22 NOTE — Progress Notes (Signed)
  Subjective:     Ronald LoboMichele Plasencia is a 43 y.o. female who presents for evaluation of symptoms of a URI. Symptoms include congestion, cough described as productive, nasal congestion, no  fever, post nasal drip, sore throat and wheezing. Onset of symptoms was 2 weeks ago, and has been gradually worsening since that time. Treatment to date: antihistamines, cough suppressants and expectorant.  The following portions of the patient's history were reviewed and updated as appropriate: allergies, current medications, past family history, past medical history, past social history, past surgical history and problem list.  Review of Systems Pertinent items are noted in HPI.   Objective:    BP 120/77  Pulse 82  Temp(Src) 98.1 F (36.7 C) (Oral)  Wt 275 lb (124.739 kg)  SpO2 95% General appearance: alert, cooperative, appears stated age and no distress Ears: normal TM's and external ear canals both ears Nose: Nares normal. Septum midline. Mucosa normal. No drainage or sinus tenderness. Throat: lips, mucosa, and tongue normal; teeth and gums normal Neck: no adenopathy, supple, symmetrical, trachea midline and thyroid not enlarged, symmetric, no tenderness/mass/nodules Lungs: diminished breath sounds bilaterally and rhonchi bilaterally Heart: S1, S2 normal   Assessment:    bronchitis   Plan:    Suggested symptomatic OTC remedies. Nasal saline spray for congestion. Zithromax per orders. Nasal steroids per orders. Follow up as needed.

## 2014-05-22 NOTE — Patient Instructions (Signed)

## 2014-05-22 NOTE — Progress Notes (Signed)
Pre visit review using our clinic review tool, if applicable. No additional management support is needed unless otherwise documented below in the visit note. 

## 2014-05-25 ENCOUNTER — Encounter: Payer: Self-pay | Admitting: Family Medicine

## 2014-06-24 ENCOUNTER — Other Ambulatory Visit: Payer: Self-pay | Admitting: Family Medicine

## 2014-06-24 DIAGNOSIS — G43909 Migraine, unspecified, not intractable, without status migrainosus: Secondary | ICD-10-CM

## 2014-06-24 DIAGNOSIS — M797 Fibromyalgia: Secondary | ICD-10-CM

## 2014-06-24 DIAGNOSIS — F988 Other specified behavioral and emotional disorders with onset usually occurring in childhood and adolescence: Secondary | ICD-10-CM

## 2014-06-25 MED ORDER — HYDROCODONE-ACETAMINOPHEN 10-325 MG PO TABS: 1.0000 | ORAL_TABLET | Freq: Four times a day (QID) | ORAL | Status: DC | PRN

## 2014-06-25 MED ORDER — TRAMADOL HCL 50 MG PO TABS: 50.0000 mg | ORAL_TABLET | Freq: Three times a day (TID) | ORAL | Status: DC | PRN

## 2014-06-25 MED ORDER — AMPHETAMINE-DEXTROAMPHETAMINE 30 MG PO TABS: 30.0000 mg | ORAL_TABLET | Freq: Two times a day (BID) | ORAL | Status: DC

## 2014-06-25 NOTE — Telephone Encounter (Signed)
The patient is asking for Tramadol, Hydrocodone and Adderall.    You said fill both which two are you approving?      KP

## 2014-07-01 ENCOUNTER — Other Ambulatory Visit: Payer: Self-pay | Admitting: Family Medicine

## 2014-07-01 DIAGNOSIS — J208 Acute bronchitis due to other specified organisms: Secondary | ICD-10-CM

## 2014-07-02 ENCOUNTER — Telehealth: Payer: Self-pay | Admitting: Family Medicine

## 2014-07-02 NOTE — Telephone Encounter (Signed)
Pt was transferred to [facility] due to her stating she was in need of her antibiotic for her inhaler because she was having difficulty breathing. Advised pt of available appointments today she stated she did not have a babysitter.

## 2014-07-02 NOTE — Telephone Encounter (Signed)
LM for Call Back

## 2014-07-03 ENCOUNTER — Ambulatory Visit: Payer: BC Managed Care – PPO

## 2014-07-03 NOTE — Telephone Encounter (Signed)
Patient is scheduled to come in today at 4p for nurse visit.

## 2014-07-08 ENCOUNTER — Ambulatory Visit: Payer: Self-pay

## 2014-07-10 ENCOUNTER — Encounter: Payer: Self-pay | Admitting: Family Medicine

## 2014-07-10 ENCOUNTER — Ambulatory Visit (INDEPENDENT_AMBULATORY_CARE_PROVIDER_SITE_OTHER): Payer: BC Managed Care – PPO | Admitting: Family Medicine

## 2014-07-10 VITALS — BP 135/85 | HR 73 | Temp 97.5°F | Wt 276.0 lb

## 2014-07-10 DIAGNOSIS — N760 Acute vaginitis: Secondary | ICD-10-CM

## 2014-07-10 DIAGNOSIS — Z3042 Encounter for surveillance of injectable contraceptive: Secondary | ICD-10-CM

## 2014-07-10 DIAGNOSIS — J0110 Acute frontal sinusitis, unspecified: Secondary | ICD-10-CM

## 2014-07-10 MED ORDER — AMOXICILLIN-POT CLAVULANATE 875-125 MG PO TABS: 1.0000 | ORAL_TABLET | Freq: Two times a day (BID) | ORAL | Status: DC

## 2014-07-10 MED ORDER — FLUCONAZOLE 150 MG PO TABS: 150.0000 mg | ORAL_TABLET | Freq: Once | ORAL | Status: DC

## 2014-07-10 MED ORDER — MEDROXYPROGESTERONE ACETATE 150 MG/ML IM SUSP
150.0000 mg | Freq: Once | INTRAMUSCULAR | Status: AC
  Administered 2014-07-10: 150 mg via INTRAMUSCULAR

## 2014-07-10 MED ORDER — MOMETASONE FUROATE 50 MCG/ACT NA SUSP: 2.0000 | Freq: Every day | NASAL | Status: DC

## 2014-07-10 NOTE — Progress Notes (Signed)
Pre visit review using our clinic review tool, if applicable. No additional management support is needed unless otherwise documented below in the visit note. 

## 2014-07-10 NOTE — Patient Instructions (Signed)

## 2014-07-10 NOTE — Progress Notes (Signed)
  Subjective:     Jo Jackson is a 43 y.o. female here for evaluation of a cough. Onset of symptoms was 2 weeks ago. Symptoms have been gradually worsening since that time. The cough is productive and is aggravated by exercise, infection and reclining position. Associated symptoms include: chills, postnasal drip, shortness of breath and sputum production. Patient does not have a history of asthma. Patient does have a history of environmental allergens. Patient has not traveled recently. Patient does not have a history of smoking. Patient has not had a previous chest x-ray. Patient has not had a PPD done. The patient is also asking for meds for vaginitis with abx and needs her depo medrol today. The following portions of the patient's history were reviewed and updated as appropriate: allergies, current medications, past family history, past medical history, past social history, past surgical history and problem list.  Review of Systems Pertinent items are noted in HPI.    Objective:    Oxygen saturation 100% on room air BP 135/85 mmHg  Pulse 73  Temp(Src) 97.5 F (36.4 C) (Oral)  Wt 276 lb (125.193 kg)  SpO2 100% General appearance: alert, cooperative, appears stated age and no distress Nose: green discharge, moderate congestion, turbinates red, swollen, sinus tenderness bilateral Throat: abnormal findings: moderate oropharyngeal erythema Neck: no adenopathy, supple, symmetrical, trachea midline and thyroid not enlarged, symmetric, no tenderness/mass/nodules Lungs: clear to auscultation bilaterally Heart: S1, S2 normal Extremities: extremities normal, atraumatic, no cyanosis or edema    Assessment:    Acute Bronchitis    Plan:    Antibiotics per medication orders. Antitussives per medication orders. Avoid exposure to tobacco smoke and fumes. B-agonist inhaler. Call if shortness of breath worsens, blood in sputum, change in character of cough, development of fever or chills,  inability to maintain nutrition and hydration. Avoid exposure to tobacco smoke and fumes.   1. Acute frontal sinusitis, recurrence not specified   - mometasone (NASONEX) 50 MCG/ACT nasal spray; Place 2 sprays into the nose daily.  Dispense: 51 g; Refill: 3 - amoxicillin-clavulanate (AUGMENTIN) 875-125 MG per tablet; Take 1 tablet by mouth 2 (two) times daily.  Dispense: 20 tablet; Refill: 0  2. Vaginitis and vulvovaginitis   - fluconazole (DIFLUCAN) 150 MG tablet; Take 1 tablet (150 mg total) by mouth once.  Dispense: 1 tablet; Refill: 0  3. Encounter for surveillance of injectable contraceptive   - medroxyPROGESTERone (DEPO-PROVERA) injection 150 mg; Inject 1 mL (150 mg total) into the muscle once.

## 2014-07-15 ENCOUNTER — Other Ambulatory Visit: Payer: Self-pay | Admitting: *Deleted

## 2014-07-15 MED ORDER — TRIAMCINOLONE ACETONIDE 55 MCG/ACT NA AERO: 2.0000 | INHALATION_SPRAY | Freq: Every day | NASAL | Status: DC

## 2014-07-15 NOTE — Telephone Encounter (Signed)
Nasonex on backorder, med switched to Nasacort AQ per Dr. Laury AxonLowne. JG//CMA

## 2014-08-23 ENCOUNTER — Other Ambulatory Visit: Payer: Self-pay | Admitting: Family Medicine

## 2014-08-23 DIAGNOSIS — M797 Fibromyalgia: Secondary | ICD-10-CM

## 2014-08-23 DIAGNOSIS — G43909 Migraine, unspecified, not intractable, without status migrainosus: Secondary | ICD-10-CM

## 2014-08-24 MED ORDER — HYDROCODONE-ACETAMINOPHEN 10-325 MG PO TABS: 1.0000 | ORAL_TABLET | Freq: Four times a day (QID) | ORAL | Status: DC | PRN

## 2014-08-24 MED ORDER — TRAMADOL HCL 50 MG PO TABS: 50.0000 mg | ORAL_TABLET | Freq: Three times a day (TID) | ORAL | Status: DC | PRN

## 2014-08-24 NOTE — Telephone Encounter (Signed)
Last seen 06/30/14 and both filled 06/25/14 #30 No UDS and No contract  Please advise     KP

## 2014-08-25 ENCOUNTER — Other Ambulatory Visit: Payer: Self-pay | Admitting: Family Medicine

## 2014-08-27 ENCOUNTER — Other Ambulatory Visit: Payer: Self-pay

## 2014-08-27 DIAGNOSIS — R109 Unspecified abdominal pain: Secondary | ICD-10-CM

## 2014-08-27 MED ORDER — OMEPRAZOLE 40 MG PO CPDR: 40.0000 mg | DELAYED_RELEASE_CAPSULE | Freq: Two times a day (BID) | ORAL | Status: DC

## 2014-08-28 ENCOUNTER — Ambulatory Visit (INDEPENDENT_AMBULATORY_CARE_PROVIDER_SITE_OTHER): Payer: BLUE CROSS/BLUE SHIELD | Admitting: Family Medicine

## 2014-08-28 ENCOUNTER — Encounter: Payer: Self-pay | Admitting: Family Medicine

## 2014-08-28 VITALS — BP 124/72 | HR 80 | Temp 98.6°F | Wt 286.0 lb

## 2014-08-28 DIAGNOSIS — F329 Major depressive disorder, single episode, unspecified: Secondary | ICD-10-CM

## 2014-08-28 DIAGNOSIS — F988 Other specified behavioral and emotional disorders with onset usually occurring in childhood and adolescence: Secondary | ICD-10-CM

## 2014-08-28 DIAGNOSIS — F32A Depression, unspecified: Secondary | ICD-10-CM

## 2014-08-28 DIAGNOSIS — B001 Herpesviral vesicular dermatitis: Secondary | ICD-10-CM

## 2014-08-28 DIAGNOSIS — F909 Attention-deficit hyperactivity disorder, unspecified type: Secondary | ICD-10-CM

## 2014-08-28 DIAGNOSIS — G43011 Migraine without aura, intractable, with status migrainosus: Secondary | ICD-10-CM

## 2014-08-28 DIAGNOSIS — G43001 Migraine without aura, not intractable, with status migrainosus: Secondary | ICD-10-CM

## 2014-08-28 MED ORDER — VALACYCLOVIR HCL 1 G PO TABS: 1000.0000 mg | ORAL_TABLET | Freq: Three times a day (TID) | ORAL | Status: DC

## 2014-08-28 MED ORDER — TIZANIDINE HCL 4 MG PO TABS: 4.0000 mg | ORAL_TABLET | Freq: Four times a day (QID) | ORAL | Status: DC | PRN

## 2014-08-28 NOTE — Assessment & Plan Note (Signed)
zanaflex as directed Refer to headache clinic

## 2014-08-28 NOTE — Assessment & Plan Note (Signed)
con't meds 

## 2014-08-28 NOTE — Patient Instructions (Signed)

## 2014-08-28 NOTE — Assessment & Plan Note (Signed)
con't effexor Refill sent 2/4

## 2014-08-28 NOTE — Progress Notes (Signed)
Subjective:    Patient ID: Jo Jackson, female    DOB: August 07, 1970, 44 y.o.   MRN: [id number]  HPI  Patient here for headaches and fmla paperwork.    Past Medical History  Diagnosis Date  . ADD (attention deficit disorder)   . Junctional escape rhythm   . Supraventricular tachycardia   . Headache(784.0)     Migraines  . Anxiety   . Depression   . Pregnancy induced hypertension   . Abnormal Pap smear     Repeats WNL  . Arthritis     Oseteoarthritis  . GERD (gastroesophageal reflux disease)   . Neuromuscular disorder     Fibromyalgia    Review of Systems  Constitutional: Positive for fatigue. Negative for activity change, appetite change and unexpected weight change.  Respiratory: Negative for cough and shortness of breath.   Cardiovascular: Negative for chest pain and palpitations.  Psychiatric/Behavioral: Negative for behavioral problems and dysphoric mood. The patient is not nervous/anxious.        Objective:    Physical Exam  Constitutional: She is oriented to person, place, and time. She appears well-developed and well-nourished. No distress.  HENT:  Right Ear: External ear normal.  Left Ear: External ear normal.  Nose: Nose normal.  Mouth/Throat: Oropharynx is clear and moist.  Eyes: EOM are normal. Pupils are equal, round, and reactive to light.  Neck: Normal range of motion. Neck supple.  Cardiovascular: Normal rate, regular rhythm and normal heart sounds.   No murmur heard. Pulmonary/Chest: Effort normal and breath sounds normal. No respiratory distress. She has no wheezes. She has no rales. She exhibits no tenderness.  Neurological: She is alert and oriented to person, place, and time.  Skin:  Cold sore upper left side lip  Psychiatric: She has a normal mood and affect. Her behavior is normal. Judgment and thought content normal.  Nursing note and vitals reviewed.   BP 124/72 mmHg  Pulse 80  Temp(Src) 98.6 F (37 C) (Oral)  Wt 286 lb  (129.729 kg)  SpO2 98%  LMP 08/21/2014 Wt Readings from Last 3 Encounters:  08/28/14 286 lb (129.729 kg)  07/10/14 276 lb (125.193 kg)  05/22/14 275 lb (124.739 kg)     Lab Results  Component Value Date   WBC 4.2 09/26/2013   HGB 13.5 09/26/2013   HCT 39.3 09/26/2013   PLT 229 09/26/2013   GLUCOSE 85 09/26/2013   CHOL 203* 04/10/2008   TRIG 56 04/10/2008   HDL 82 04/10/2008   LDLCALC 110* 04/10/2008   ALT 25 09/26/2013   AST 20 09/26/2013   NA 139 09/26/2013   K 4.1 09/26/2013   CL 102 09/26/2013   CREATININE 0.64 09/26/2013   BUN 10 09/26/2013   CO2 28 09/26/2013   TSH 2.456 06/06/2013   HGBA1C 5.4 06/06/2013    Mm Digital Screening 3d Tomo  08/25/2013   CLINICAL DATA:  Screening.  EXAM: DIGITAL SCREENING BILATERAL MAMMOGRAM WITH 3D TOMO WITH CAD  COMPARISON:  None  ACR Breast Density Category b: There are scattered areas of fibroglandular density.  FINDINGS: There are no findings suspicious for malignancy. Images were processed with CAD.  IMPRESSION: No mammographic evidence of malignancy. A result letter of this screening mammogram will be mailed directly to the patient.  RECOMMENDATION: Screening mammogram in one year. (Code:SM-B-01Y)  BI-RADS CATEGORY  1: Negative.   Electronically Signed   By: Rosalie Gums M.D.   On: 08/25/2013 10:27       Assessment &  Plan:   Problem List Items Addressed This Visit    Migraine without aura    zanaflex as directed Refer to headache clinic      Relevant Medications   tiZANidine (ZANAFLEX) tablet   Other Relevant Orders   AMB referral to headache clinic   Depression    con't effexor Refill sent 2/4      Attention deficit disorder    con't meds       Other Visit Diagnoses    Intractable migraine without aura and with status migrainosus    -  Primary    Relevant Medications    tiZANidine (ZANAFLEX) tablet    Other Relevant Orders    AMB referral to headache clinic    Cold sore        Relevant Medications     valACYclovir (VALTREX) tablet        Loreen FreudYvonne Lowne, DO

## 2014-08-28 NOTE — Progress Notes (Signed)
Pre visit review using our clinic review tool, if applicable. No additional management support is needed unless otherwise documented below in the visit note. 

## 2014-09-07 ENCOUNTER — Ambulatory Visit: Payer: Self-pay | Admitting: Neurology

## 2014-09-07 ENCOUNTER — Telehealth: Payer: Self-pay | Admitting: Neurology

## 2014-09-07 NOTE — Telephone Encounter (Signed)
LM for pt to call and r/s. We can put this patient on anyone's schedule Jo Jackson([facility]) to get patient in soonest. She is coming in for headaches. Awaiting a return call / [name]

## 2014-09-07 NOTE — Telephone Encounter (Signed)
Pt cancelling today's NP appt w/ Dr. Everlena CooperJaffe due to weather. Dr. Everlena CooperJaffe, please review your schedule - where can we put this patient back on?   Jo Jackson - once we find a place for this pt, please call pt to r/s / [name]

## 2014-09-07 NOTE — Telephone Encounter (Signed)
That would be okay

## 2014-09-15 NOTE — Telephone Encounter (Signed)
Please call pt a 2nd time to see if she wants to r/s her NP appt. It was cancelled due to weather. She can schedule w/ [facility], [facility], or [facility].Marland Kitchen....whoever can get her in first. / [name]

## 2014-09-15 NOTE — Telephone Encounter (Signed)
Left message for the patient to call back for appt  °

## 2014-09-16 ENCOUNTER — Encounter: Payer: Self-pay | Admitting: Family Medicine

## 2014-09-21 NOTE — Telephone Encounter (Signed)
Left message for the patient to call back to see if she would like to resch appt

## 2014-09-21 NOTE — Telephone Encounter (Signed)
Pt has contacted our office and r/s appt / Sherri

## 2014-09-21 NOTE — Telephone Encounter (Signed)
Please make final attempt to contact pt. This will be a 3rd try. If pt still has not responded by the end of the week, please notify the referring provider's office. I have patient's new patient paperwork at my desk but will forward it to you / [name]

## 2014-09-22 ENCOUNTER — Encounter: Payer: Self-pay | Admitting: Family Medicine

## 2014-09-22 ENCOUNTER — Ambulatory Visit (INDEPENDENT_AMBULATORY_CARE_PROVIDER_SITE_OTHER): Payer: BLUE CROSS/BLUE SHIELD | Admitting: Family Medicine

## 2014-09-22 VITALS — BP 138/92 | HR 74 | Temp 97.9°F | Wt 288.2 lb

## 2014-09-22 DIAGNOSIS — J0110 Acute frontal sinusitis, unspecified: Secondary | ICD-10-CM

## 2014-09-22 MED ORDER — AMOXICILLIN-POT CLAVULANATE 875-125 MG PO TABS: 1.0000 | ORAL_TABLET | Freq: Two times a day (BID) | ORAL | Status: DC

## 2014-09-22 NOTE — Patient Instructions (Signed)

## 2014-09-22 NOTE — Progress Notes (Signed)
  Subjective:     Jo LoboMichele Jackson is a 44 y.o. female who presents for evaluation of sinus pain. Symptoms include: congestion, facial pain, headaches, nasal congestion and sinus pressure. Onset of symptoms was 3 weeks ago. Symptoms have been gradually worsening since that time. Past history is significant for no history of pneumonia or bronchitis. Patient is a non-smoker.  The following portions of the patient's history were reviewed and updated as appropriate: allergies, current medications, past family history, past medical history, past social history, past surgical history and problem list.  Review of Systems Pertinent items are noted in HPI.   Objective:    BP 138/92 mmHg  Pulse 74  Temp(Src) 97.9 F (36.6 C) (Oral)  Wt 288 lb 3.2 oz (130.727 kg)  SpO2 98%  LMP 08/21/2014 General appearance: alert, cooperative, appears stated age and no distress Ears: abnormal TM left ear - dull and retracted Nose: Nares normal. Septum midline. Mucosa normal. No drainage or sinus tenderness. Throat: lips, mucosa, and tongue normal; teeth and gums normal Neck: no adenopathy, no carotid bruit, no JVD, supple, symmetrical, trachea midline and thyroid not enlarged, symmetric, no tenderness/mass/nodules Lungs: clear to auscultation bilaterally Heart: regular rate and rhythm, S1, S2 normal, no murmur, click, rub or gallop    Assessment:    Acute bacterial sinusitis.    Plan:    Nasal steroids per medication orders. Antihistamines per medication orders. Augmentin per medication orders.

## 2014-09-22 NOTE — Progress Notes (Signed)
Pre visit review using our clinic review tool, if applicable. No additional management support is needed unless otherwise documented below in the visit note. 

## 2014-09-23 ENCOUNTER — Other Ambulatory Visit: Payer: Self-pay | Admitting: Family Medicine

## 2014-09-23 DIAGNOSIS — F988 Other specified behavioral and emotional disorders with onset usually occurring in childhood and adolescence: Secondary | ICD-10-CM

## 2014-09-23 MED ORDER — AMPHETAMINE-DEXTROAMPHETAMINE 30 MG PO TABS: 30.0000 mg | ORAL_TABLET | Freq: Two times a day (BID) | ORAL | Status: DC

## 2014-09-23 NOTE — Telephone Encounter (Signed)
Patient is requesting diflucan. Please advise     KP

## 2014-09-24 MED ORDER — FLUCONAZOLE 150 MG PO TABS: 150.0000 mg | ORAL_TABLET | Freq: Once | ORAL | Status: DC

## 2014-09-24 MED ORDER — AMPHETAMINE-DEXTROAMPHETAMINE 30 MG PO TABS: 30.0000 mg | ORAL_TABLET | Freq: Two times a day (BID) | ORAL | Status: DC

## 2014-09-24 NOTE — Addendum Note (Signed)
Addended by: Arnette NorrisPAYNE, Jarold Macomber P on: 09/24/2014 11:10 AM   Modules accepted: Orders

## 2014-09-28 ENCOUNTER — Telehealth: Payer: Self-pay | Admitting: Neurology

## 2014-09-28 NOTE — Telephone Encounter (Signed)
We have called patient several times to see if she would like to resch appt to see Dr Everlena CooperJaffe and were unable to contact patient. Notified the referring Dr office by epic

## 2014-10-09 ENCOUNTER — Ambulatory Visit (INDEPENDENT_AMBULATORY_CARE_PROVIDER_SITE_OTHER): Payer: BLUE CROSS/BLUE SHIELD | Admitting: *Deleted

## 2014-10-09 DIAGNOSIS — Z308 Encounter for other contraceptive management: Secondary | ICD-10-CM

## 2014-10-09 MED ORDER — MEDROXYPROGESTERONE ACETATE 150 MG/ML IM SUSP
150.0000 mg | Freq: Once | INTRAMUSCULAR | Status: AC
  Administered 2014-10-09: 150 mg via INTRAMUSCULAR

## 2014-10-09 NOTE — Progress Notes (Signed)
Pre visit review using our clinic review tool, if applicable. No additional management support is needed unless otherwise documented below in the visit note.  Patient tolerated injection well.  Next injection scheduled for 01/01/15.

## 2014-10-13 ENCOUNTER — Telehealth: Payer: Self-pay | Admitting: Family Medicine

## 2014-10-13 DIAGNOSIS — H9319 Tinnitus, unspecified ear: Secondary | ICD-10-CM

## 2014-10-13 NOTE — Telephone Encounter (Signed)
Ref for the ENT placed.      KP

## 2014-10-13 NOTE — Telephone Encounter (Signed)
Caller name:Camus, Debroh Relation to WU:JWJXpt:self Call back number:.[phone] Pharmacy:  Reason for call: pt states she is still having trouble with her ears and would like for d.r lowne to go ahead and refer her to a ENT, pt has bcbs.

## 2014-10-19 ENCOUNTER — Other Ambulatory Visit: Payer: Self-pay | Admitting: Family Medicine

## 2014-10-19 ENCOUNTER — Ambulatory Visit (INDEPENDENT_AMBULATORY_CARE_PROVIDER_SITE_OTHER): Payer: BLUE CROSS/BLUE SHIELD | Admitting: Family Medicine

## 2014-10-19 ENCOUNTER — Encounter: Payer: Self-pay | Admitting: Family Medicine

## 2014-10-19 VITALS — BP 136/88 | HR 86 | Temp 98.1°F | Wt 292.4 lb

## 2014-10-19 DIAGNOSIS — G43119 Migraine with aura, intractable, without status migrainosus: Secondary | ICD-10-CM | POA: Diagnosis not present

## 2014-10-19 DIAGNOSIS — G43909 Migraine, unspecified, not intractable, without status migrainosus: Secondary | ICD-10-CM

## 2014-10-19 DIAGNOSIS — J0110 Acute frontal sinusitis, unspecified: Secondary | ICD-10-CM | POA: Diagnosis not present

## 2014-10-19 DIAGNOSIS — M797 Fibromyalgia: Secondary | ICD-10-CM

## 2014-10-19 DIAGNOSIS — F419 Anxiety disorder, unspecified: Secondary | ICD-10-CM | POA: Diagnosis not present

## 2014-10-19 DIAGNOSIS — G43011 Migraine without aura, intractable, with status migrainosus: Secondary | ICD-10-CM

## 2014-10-19 MED ORDER — VENLAFAXINE HCL ER 150 MG PO CP24: 150.0000 mg | ORAL_CAPSULE | Freq: Every day | ORAL | Status: DC

## 2014-10-19 MED ORDER — HYDROCODONE-ACETAMINOPHEN 10-325 MG PO TABS: 1.0000 | ORAL_TABLET | Freq: Four times a day (QID) | ORAL | Status: DC | PRN

## 2014-10-19 MED ORDER — LEVOFLOXACIN 500 MG PO TABS: 500.0000 mg | ORAL_TABLET | Freq: Every day | ORAL | Status: DC

## 2014-10-19 MED ORDER — TIZANIDINE HCL 4 MG PO TABS: 4.0000 mg | ORAL_TABLET | Freq: Four times a day (QID) | ORAL | Status: DC | PRN

## 2014-10-19 MED ORDER — TRAMADOL HCL 50 MG PO TABS: 50.0000 mg | ORAL_TABLET | Freq: Three times a day (TID) | ORAL | Status: DC | PRN

## 2014-10-19 NOTE — Progress Notes (Signed)
Subjective:     Jo Jackson is a 44 y.o. female who presents for evaluation of sinus pain. Symptoms include: congestion, facial pain, headaches, nasal congestion and sinus pressure. Onset of symptoms was several days ago. --- started day after abx finished.  Symptoms have been gradually worsening since that time. Past history is significant for no history of pneumonia or bronchitis. Patient is a former smoker.  She also is here to f/u migraines and get refills.  She also needs her fmla paperwork filled out for the migraines.    The following portions of the patient's history were reviewed and updated as appropriate:  She  has a past medical history of ADD (attention deficit disorder); Junctional escape rhythm; Supraventricular tachycardia; Headache(784.0); Anxiety; Depression; Pregnancy induced hypertension; Abnormal Pap smear; Arthritis; GERD (gastroesophageal reflux disease); and Neuromuscular disorder. She  does not have any pertinent problems on file. She  has past surgical history that includes Appendectomy (1986); Carpal tunnel release (2000); Cervical fusion (08-24-99,04-26-00,05-09-01); Shoulder surgery (left-6-04, right 09-12-04); tongue polyp (1984); Ablation of dysrhythmic focus; and Knee sugery (Left). Her family history includes Bipolar disorder in an other family member; Depression in an other family member; Diabetes in an other family member; Hodgkin's lymphoma in her father; Hypertension in an other family member; Thyroid disease in an other family member. She  reports that she quit smoking about 6 years ago. She has never used smokeless tobacco. She reports that she drinks alcohol. She reports that she does not use illicit drugs. She has a current medication list which includes the following prescription(s): albuterol, alprazolam, amphetamine-dextroamphetamine, omeprazole, sumatriptan, triamcinolone, valacyclovir, hydrocodone-acetaminophen, levofloxacin, tizanidine, tramadol, and  venlafaxine xr. Current Outpatient Prescriptions on File Prior to Visit  Medication Sig Dispense Refill  . albuterol (PROAIR HFA) 108 (90 BASE) MCG/ACT inhaler Inhale 2 puffs into the lungs every 6 (six) hours as needed for wheezing or shortness of breath. 1 Inhaler 2  . ALPRAZolam (XANAX) 0.25 MG tablet Take 1 tablet (0.25 mg total) by mouth 3 (three) times daily as needed for anxiety. 30 tablet 0  . amphetamine-dextroamphetamine (ADDERALL) 30 MG tablet Take 1 tablet by mouth 2 (two) times daily. 180 tablet 0  . omeprazole (PRILOSEC) 40 MG capsule Take 1 capsule (40 mg total) by mouth 2 (two) times daily before a meal. 180 capsule 3  . SUMAtriptan (IMITREX) 50 MG tablet Take 1 tablet (50 mg total) by mouth once. May repeat in 2 hours if headache persists or recurs. 10 tablet 3  . triamcinolone (NASACORT AQ) 55 MCG/ACT AERO nasal inhaler Place 2 sprays into the nose daily. 1 Inhaler 12  . valACYclovir (VALTREX) 1000 MG tablet Take 1 tablet (1,000 mg total) by mouth 3 (three) times daily. 30 tablet 5   No current facility-administered medications on file prior to visit.   She is allergic to ketorolac tromethamine; morphine; and latex..  Review of Systems Pertinent items are noted in HPI.   Objective:    BP 136/88 mmHg  Pulse 86  Temp(Src) 98.1 F (36.7 C) (Oral)  Wt 292 lb 6.4 oz (132.632 kg)  SpO2 97%  LMP 10/09/2014 General appearance: alert, cooperative, appears stated age and no distress Ears: + fluid, tm dull , no errythema Nose: green discharge, moderate congestion, turbinates red, swollen, sinus tenderness bilateral Throat: lips, mucosa, and tongue normal; teeth and gums normal Neck: no adenopathy, supple, symmetrical, trachea midline and thyroid not enlarged, symmetric, no tenderness/mass/nodules Lungs: clear to auscultation bilaterally Heart: S1, S2 normal    Assessment:  Acute bacterial sinusitis.    Plan:    Nasal steroids per medication orders. Antihistamines  per medication orders. levaquin per medication orders.    1. Acute frontal sinusitis, recurrence not specified   - levofloxacin (LEVAQUIN) 500 MG tablet; Take 1 tablet (500 mg total) by mouth daily.  Dispense: 7 tablet; Refill: 0 Keep ENT appointment 2. Anxiety disorder, unspecified anxiety disorder type  Inc effexor to 150 mg --- f/u 3 months or sooner prn - venlafaxine XR (EFFEXOR XR) 150 MG 24 hr capsule; Take 1 capsule (150 mg total) by mouth daily with breakfast.  Dispense: 90 capsule; Refill: 3  3. Intractable migraine with aura without status migrainosus  Refill pain med--- controlled fmla paperwork filled out

## 2014-10-19 NOTE — Patient Instructions (Signed)

## 2014-10-19 NOTE — Telephone Encounter (Signed)
Patient is in the office now and last filled 08/24/14. Please advise       KP

## 2014-10-19 NOTE — Progress Notes (Signed)
Pre visit review using our clinic review tool, if applicable. No additional management support is needed unless otherwise documented below in the visit note. 

## 2014-11-06 ENCOUNTER — Ambulatory Visit (INDEPENDENT_AMBULATORY_CARE_PROVIDER_SITE_OTHER): Payer: BLUE CROSS/BLUE SHIELD | Admitting: Neurology

## 2014-11-06 ENCOUNTER — Encounter: Payer: Self-pay | Admitting: Neurology

## 2014-11-06 VITALS — BP 142/84 | HR 84 | Resp 16 | Ht 67.0 in | Wt 281.0 lb

## 2014-11-06 DIAGNOSIS — G43009 Migraine without aura, not intractable, without status migrainosus: Secondary | ICD-10-CM | POA: Diagnosis not present

## 2014-11-06 DIAGNOSIS — G4486 Cervicogenic headache: Secondary | ICD-10-CM | POA: Insufficient documentation

## 2014-11-06 DIAGNOSIS — R51 Headache: Secondary | ICD-10-CM

## 2014-11-06 NOTE — Patient Instructions (Signed)
I agree with Dr. Ernst SpellLowne's management Continue venlafaxine ER 150mg  daily, which is used as a migraine preventative For neck spasms/migraine, take Zanaflex or the Advil/Aleve.  Limit use of pain relievers to no more than 2 days out of the week Follow up in 3 months or as needed

## 2014-11-06 NOTE — Progress Notes (Signed)
NEUROLOGY CONSULTATION NOTE  Javanna Patin MRN: [id number] DOB: 08-02-1970  Referring provider: Dr. Laury Axon Primary care provider: Dr. Laury Axon  Reason for consult:  Migraine  HISTORY OF PRESENT ILLNESS: Jo Jackson is a 44 year old right-handed woman with ADD, depression and migraine who presents for migraine.  Records and cervical CT report from 2003 reviewed.  Onset:  She has had menstrually-related migraines since age 37, but they have been related to the neck since repeated cervical spinal fusions at C6-C7 over 10 years ago, as demonstrated on CT report from 2003. Location:  She develops spasm in the right trapezius which triggers pain in the back of head on the right and radiates to the right temple Quality:  Squeezing, throbbing Intensity:  7/10 Aura:  no Prodrome:  no Associated symptoms:  Photophobia, phonophobia.  Has not had nausea for 3 years.  No visual disturbance. Duration:  Fairly quickly with medication but can last 2-3 days approximately 1 to 2 times a month Frequency:  At least 1 every two weeks (approximately 6-7 headache days/month) Triggers/exacerbating factors:  Previously, it was her period (until she started taking Depo shot), now neck spasms Relieving factors:  Rest, tizanidine Activity:  functions  Past abortive therapy:  Imitrex [place]send (effective but side effects), cyclobenzaprine, Soma, Robaxin, Tylenol Past preventative therapy:  Inderal LA (side effects)  Current abortive therapy:  tizanidine , ibuprofen , Aleve, rarely takes sumatriptan  Current preventative therapy:  venlafaxine ER  (for depression-increased from  3 weeks ago.  She has not had a migraine since increase) Other medication:  Adderall  twice daily, alprazolam 0.25mg   Caffeine:  Sweet tea Alcohol:  occasionally Smoker:  no Diet:  Needs to hydrate more Exercise:  Not routine Depression/stress:  better Sleep hygiene:  varies Family history of headache:   mom  PAST MEDICAL HISTORY: Past Medical History  Diagnosis Date  . ADD (attention deficit disorder)   . Junctional escape rhythm   . Supraventricular tachycardia   . Headache(784.0)     Migraines  . Anxiety   . Depression   . Pregnancy induced hypertension   . Abnormal Pap smear     Repeats WNL  . Arthritis     Oseteoarthritis  . GERD (gastroesophageal reflux disease)   . Neuromuscular disorder     Fibromyalgia    PAST SURGICAL HISTORY: Past Surgical History  Procedure Laterality Date  . Appendectomy  1986  . Carpal tunnel release  2000  . Cervical fusion  08-24-99,04-26-00,05-09-01  . Shoulder surgery  left-6-04, right 09-12-04  . Tongue polyp  1984  . Ablation of dysrhythmic focus    . Knee sugery Left     16 yrs. old    MEDICATIONS: Current Outpatient Prescriptions on File Prior to Visit  Medication Sig Dispense Refill  . albuterol (PROAIR HFA) 108 (90 BASE) MCG/ACT inhaler Inhale 2 puffs into the lungs every 6 (six) hours as needed for wheezing or shortness of breath. 1 Inhaler 2  . amphetamine-dextroamphetamine (ADDERALL) 30 MG tablet Take 1 tablet by mouth 2 (two) times daily. 180 tablet 0  . HYDROcodone-acetaminophen (NORCO) 10-325 MG per tablet Take 1 tablet by mouth every 6 (six) hours as needed. 30 tablet 0  . omeprazole (PRILOSEC) 40 MG capsule Take 1 capsule (40 mg total) by mouth 2 (two) times daily before a meal. 180 capsule 3  . SUMAtriptan (IMITREX) 50 MG tablet Take 1 tablet (50 mg total) by mouth once. May repeat in 2 hours if headache persists or  recurs. 10 tablet 3  . tiZANidine (ZANAFLEX) 4 MG tablet Take 1 tablet (4 mg total) by mouth every 6 (six) hours as needed for muscle spasms. 30 tablet 0  . traMADol (ULTRAM) 50 MG tablet Take 1 tablet (50 mg total) by mouth every 8 (eight) hours as needed. 30 tablet 0  . triamcinolone (NASACORT AQ) 55 MCG/ACT AERO nasal inhaler Place 2 sprays into the nose daily. 1 Inhaler 12  . venlafaxine XR (EFFEXOR XR) 150  MG 24 hr capsule Take 1 capsule (150 mg total) by mouth daily with breakfast. 90 capsule 3  . valACYclovir (VALTREX) 1000 MG tablet Take 1 tablet (1,000 mg total) by mouth 3 (three) times daily. (Patient not taking: Reported on 11/06/2014) 30 tablet 5   No current facility-administered medications on file prior to visit.    ALLERGIES: Allergies  Allergen Reactions  . Ketorolac Tromethamine Hives  . Morphine Other (See Comments)    "makes me the devil."  . Latex Rash and Other (See Comments)    Latex catheter caused extreme irritation    FAMILY HISTORY: Family History  Problem Relation Age of Onset  . Diabetes    . Thyroid disease    . Hypertension    . Hodgkin's lymphoma Father   . Depression    . Bipolar disorder      SOCIAL HISTORY: History   Social History  . Marital Status: Married    Spouse Name: N/A  . Number of Children: 2  . Years of Education: N/A   Occupational History  .  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 [employer]    Social History Main Topics  . Smoking status: Former Smoker    Quit date: 10/23/2007  . Smokeless tobacco: Never Used  . Alcohol Use: 0.0 oz/week    0 Standard drinks or equivalent per week     Comment: Occ  . Drug Use: No  . Sexual Activity: Yes   Other Topics Concern  . Not on file   Social History Narrative   G1p1    REVIEW OF SYSTEMS: Constitutional: No fevers, chills, or sweats, no generalized fatigue, change in appetite Eyes: No visual changes, double vision, eye pain Ear, nose and throat: No hearing loss, ear pain, nasal congestion, sore throat Cardiovascular: No chest pain, palpitations Respiratory:  No shortness of breath at rest or with exertion, wheezes GastrointestinaI: No nausea, vomiting, diarrhea, abdominal pain, fecal incontinence Genitourinary:  No dysuria, urinary retention or frequency Musculoskeletal:  No neck pain, back pain Integumentary: Neck pain Neurological: as above Psychiatric: No depression, insomnia,  anxiety Endocrine: No palpitations, fatigue, diaphoresis, mood swings, change in appetite, change in weight, increased thirst Hematologic/Lymphatic:  No anemia, purpura, petechiae. Allergic/Immunologic: no itchy/runny eyes, nasal congestion, recent allergic reactions, rashes  PHYSICAL EXAM: Filed Vitals:   11/06/14 1428  BP: 142/84  Pulse: 84  Resp: 16   General: No acute distress Head:  Normocephalic/atraumatic Eyes:  fundi unremarkable, without vessel changes, exudates, hemorrhages or papilledema. Neck: supple, no paraspinal tenderness, full range of motion Back: No paraspinal tenderness Heart: regular rate and rhythm Lungs: Clear to auscultation bilaterally. Vascular: No carotid bruits. Neurological Exam: Mental status: alert and oriented to person, place, and time, recent and remote memory intact, fund of knowledge intact, attention and concentration intact, speech fluent and not dysarthric, language intact. Cranial nerves: CN I: not tested CN II: pupils equal, round and reactive to light, visual fields intact, fundi unremarkable, without vessel changes, exudates, hemorrhages or papilledema.  CN III, IV, VI:  full range of motion, no nystagmus, no ptosis CN V: facial sensation intact CN VII: upper and lower face symmetric CN VIII: hearing intact CN IX, X: gag intact, uvula midline CN XI: sternocleidomastoid and trapezius muscles intact CN XII: tongue midline Bulk & Tone: normal, no fasciculations. Motor:  5/5 throughout Sensation:  Temperature and vibration intact Deep Tendon Reflexes:  2+ throughout, toes downgoing Finger to nose testing:  No dysmetria Heel to shin:  No dysmetria Gait:  Normal station and stride.  Able to turn and walk in tandem. Romberg negative.  IMPRESSION: Cervicogenic migraine Morbid obesity  PLAN: I agree with current plan. 1.  Continue venlafaxine ER 150mg  daily 2.  Tizanidine 4mg  or Advil or Aleve for abortive therapy 3.  She is planning on  stopping Depo shot due to weight gain.  We will monitor recurrence of migraine. 4.  Weight loss 5.  Follow up in 3 months or as needed.  45 minutes spent with patient, over 50% spent discussing management  Thank you for allowing me to take part in the care of this patient.  Shon MilletAdam Jaffe, DO  CC:  Loreen FreudYvonne Lowne, DO

## 2014-12-22 ENCOUNTER — Other Ambulatory Visit: Payer: Self-pay | Admitting: Family Medicine

## 2014-12-22 DIAGNOSIS — G43011 Migraine without aura, intractable, with status migrainosus: Secondary | ICD-10-CM

## 2014-12-22 DIAGNOSIS — G43909 Migraine, unspecified, not intractable, without status migrainosus: Secondary | ICD-10-CM

## 2014-12-22 DIAGNOSIS — F988 Other specified behavioral and emotional disorders with onset usually occurring in childhood and adolescence: Secondary | ICD-10-CM

## 2014-12-22 DIAGNOSIS — M797 Fibromyalgia: Secondary | ICD-10-CM

## 2014-12-22 MED ORDER — TRAMADOL HCL 50 MG PO TABS: 50.0000 mg | ORAL_TABLET | Freq: Three times a day (TID) | ORAL | Status: DC | PRN

## 2014-12-22 MED ORDER — AMPHETAMINE-DEXTROAMPHETAMINE 30 MG PO TABS: 30.0000 mg | ORAL_TABLET | Freq: Two times a day (BID) | ORAL | Status: DC

## 2014-12-22 MED ORDER — HYDROCODONE-ACETAMINOPHEN 10-325 MG PO TABS: 1.0000 | ORAL_TABLET | Freq: Four times a day (QID) | ORAL | Status: DC | PRN

## 2014-12-22 MED ORDER — TIZANIDINE HCL 4 MG PO TABS: 4.0000 mg | ORAL_TABLET | Freq: Four times a day (QID) | ORAL | Status: DC | PRN

## 2014-12-22 NOTE — Telephone Encounter (Signed)
Last seen 10/19/14 and filled   amphetamine-dextroamphetamine  30 MG tablet  09/24/14 #180 traMADol  50 MG tablet 10/19/14 #30 HYDROcodone-acetaminophen 10-325 MG per tablet 10/19/14 #30 tiZANidine 4 MG tablet 10/19/14 #30  UDS 09/01/14 low risk   Please advise     KP

## 2014-12-24 ENCOUNTER — Other Ambulatory Visit: Payer: Self-pay | Admitting: Family Medicine

## 2015-01-01 ENCOUNTER — Ambulatory Visit: Payer: Self-pay

## 2015-01-07 ENCOUNTER — Other Ambulatory Visit: Payer: Self-pay | Admitting: Family Medicine

## 2015-01-07 ENCOUNTER — Encounter: Payer: Self-pay | Admitting: Family Medicine

## 2015-01-07 DIAGNOSIS — G8929 Other chronic pain: Secondary | ICD-10-CM

## 2015-01-07 MED ORDER — DULOXETINE HCL 30 MG PO CPEP: ORAL_CAPSULE | ORAL | Status: DC

## 2015-01-08 ENCOUNTER — Ambulatory Visit: Payer: Self-pay | Admitting: Family Medicine

## 2015-02-01 ENCOUNTER — Other Ambulatory Visit: Payer: Self-pay | Admitting: Family Medicine

## 2015-02-12 ENCOUNTER — Ambulatory Visit: Payer: Self-pay | Admitting: Neurology

## 2015-02-23 ENCOUNTER — Other Ambulatory Visit: Payer: Self-pay | Admitting: Family Medicine

## 2015-02-23 DIAGNOSIS — G43011 Migraine without aura, intractable, with status migrainosus: Secondary | ICD-10-CM

## 2015-02-23 DIAGNOSIS — G43809 Other migraine, not intractable, without status migrainosus: Secondary | ICD-10-CM

## 2015-02-23 DIAGNOSIS — M797 Fibromyalgia: Secondary | ICD-10-CM

## 2015-02-23 DIAGNOSIS — G43909 Migraine, unspecified, not intractable, without status migrainosus: Secondary | ICD-10-CM

## 2015-02-24 MED ORDER — HYDROCODONE-ACETAMINOPHEN 10-325 MG PO TABS: 1.0000 | ORAL_TABLET | Freq: Four times a day (QID) | ORAL | Status: DC | PRN

## 2015-02-24 MED ORDER — TIZANIDINE HCL 4 MG PO TABS: 4.0000 mg | ORAL_TABLET | Freq: Four times a day (QID) | ORAL | Status: DC | PRN

## 2015-02-24 MED ORDER — TRAMADOL HCL 50 MG PO TABS: 50.0000 mg | ORAL_TABLET | Freq: Three times a day (TID) | ORAL | Status: DC | PRN

## 2015-02-24 NOTE — Telephone Encounter (Signed)
Last seen 10/19/14 and filled 12/22/14 #30 for all. Please advise     KP  traMADol (ULTRAM) 50 MG tablet  HYDROcodone-acetaminophen (NORCO) 10-325 MG per tablet tiZANidine (ZANAFLEX) 4 MG tablet

## 2015-02-25 MED ORDER — HYDROCODONE-ACETAMINOPHEN 10-325 MG PO TABS: 1.0000 | ORAL_TABLET | Freq: Four times a day (QID) | ORAL | Status: DC | PRN

## 2015-02-25 MED ORDER — TRAMADOL HCL 50 MG PO TABS: 50.0000 mg | ORAL_TABLET | Freq: Three times a day (TID) | ORAL | Status: DC | PRN

## 2015-02-25 NOTE — Addendum Note (Signed)
Addended by: Arnette Norris on: 02/25/2015 08:12 AM   Modules accepted: Orders

## 2015-02-26 ENCOUNTER — Ambulatory Visit: Payer: Self-pay | Admitting: Family Medicine

## 2015-03-19 DIAGNOSIS — Y999 Unspecified external cause status: Secondary | ICD-10-CM | POA: Insufficient documentation

## 2015-03-19 DIAGNOSIS — K219 Gastro-esophageal reflux disease without esophagitis: Secondary | ICD-10-CM | POA: Insufficient documentation

## 2015-03-19 DIAGNOSIS — M1711 Unilateral primary osteoarthritis, right knee: Secondary | ICD-10-CM | POA: Diagnosis not present

## 2015-03-19 DIAGNOSIS — Z8679 Personal history of other diseases of the circulatory system: Secondary | ICD-10-CM | POA: Diagnosis not present

## 2015-03-19 DIAGNOSIS — X58XXXA Exposure to other specified factors, initial encounter: Secondary | ICD-10-CM | POA: Insufficient documentation

## 2015-03-19 DIAGNOSIS — S8992XA Unspecified injury of left lower leg, initial encounter: Secondary | ICD-10-CM | POA: Insufficient documentation

## 2015-03-19 DIAGNOSIS — Z8669 Personal history of other diseases of the nervous system and sense organs: Secondary | ICD-10-CM | POA: Insufficient documentation

## 2015-03-19 DIAGNOSIS — G43909 Migraine, unspecified, not intractable, without status migrainosus: Secondary | ICD-10-CM | POA: Insufficient documentation

## 2015-03-19 DIAGNOSIS — Z91040 Latex allergy status: Secondary | ICD-10-CM | POA: Insufficient documentation

## 2015-03-19 DIAGNOSIS — Z793 Long term (current) use of hormonal contraceptives: Secondary | ICD-10-CM | POA: Diagnosis not present

## 2015-03-19 DIAGNOSIS — Y9301 Activity, walking, marching and hiking: Secondary | ICD-10-CM | POA: Diagnosis not present

## 2015-03-19 DIAGNOSIS — Y929 Unspecified place or not applicable: Secondary | ICD-10-CM | POA: Diagnosis not present

## 2015-03-19 DIAGNOSIS — Z79899 Other long term (current) drug therapy: Secondary | ICD-10-CM | POA: Diagnosis not present

## 2015-03-19 DIAGNOSIS — F909 Attention-deficit hyperactivity disorder, unspecified type: Secondary | ICD-10-CM | POA: Diagnosis not present

## 2015-03-19 DIAGNOSIS — Z87891 Personal history of nicotine dependence: Secondary | ICD-10-CM | POA: Insufficient documentation

## 2015-03-19 DIAGNOSIS — F419 Anxiety disorder, unspecified: Secondary | ICD-10-CM | POA: Insufficient documentation

## 2015-03-19 DIAGNOSIS — Z7951 Long term (current) use of inhaled steroids: Secondary | ICD-10-CM | POA: Diagnosis not present

## 2015-03-20 ENCOUNTER — Encounter (HOSPITAL_COMMUNITY): Payer: Self-pay | Admitting: Emergency Medicine

## 2015-03-20 ENCOUNTER — Emergency Department (HOSPITAL_COMMUNITY)
Admission: EM | Admit: 2015-03-20 | Discharge: 2015-03-20 | Disposition: A | Discharge status: Home / Self Care | Payer: BLUE CROSS/BLUE SHIELD | Attending: Emergency Medicine | Admitting: Emergency Medicine

## 2015-03-20 DIAGNOSIS — S8992XA Unspecified injury of left lower leg, initial encounter: Secondary | ICD-10-CM

## 2015-03-20 DIAGNOSIS — M1712 Unilateral primary osteoarthritis, left knee: Secondary | ICD-10-CM

## 2015-03-20 MED ORDER — IBUPROFEN 400 MG PO TABS
400.0000 mg | ORAL_TABLET | Freq: Once | ORAL | Status: AC
  Administered 2015-03-20: 400 mg via ORAL
  Filled 2015-03-20: qty 1

## 2015-03-20 NOTE — ED Provider Notes (Signed)
CSN: [id number]     Arrival date & time 03/19/15  2352 History   First MD Initiated Contact with Patient 03/20/15 0014     Chief Complaint  Patient presents with  . Knee Pain    HPI  [name] is a 44 year old female with PMHx of knee OA, GERD and migraines presenting with worsening knee pain. Pt reports 2 day history of left lateral knee pain. She describes it as "feeling like something has to pop". Earlier this evening, she was walking up the stairs and felt a pop in her left posterior knee and sharp pains throughout the knee. Pain is localized to posterior lateral knee and describes as "deep and aching". Pain increases with leg extension. She has not taken any pain medications. Denies edema, feelings of instability or redness of the knee. She is still able to walk. Denies fevers, SOB, chest pain, abdominal pain, nausea or vomiting.   Past Medical History  Diagnosis Date  . ADD (attention deficit disorder)   . Junctional escape rhythm   . Supraventricular tachycardia   . Headache(784.0)     Migraines  . Anxiety   . Depression   . Pregnancy induced hypertension   . Abnormal Pap smear     Repeats WNL  . Arthritis     Oseteoarthritis  . GERD (gastroesophageal reflux disease)   . Neuromuscular disorder     Fibromyalgia   Past Surgical History  Procedure Laterality Date  . Appendectomy  1986  . Carpal tunnel release  2000  . Cervical fusion  08-24-99,04-26-00,05-09-01  . Shoulder surgery  left-6-04, right 09-12-04  . Tongue polyp  1984  . Ablation of dysrhythmic focus    . Knee sugery Left     16 yrs. old   Family History  Problem Relation Age of Onset  . Diabetes    . Thyroid disease    . Hypertension    . Hodgkin's lymphoma Father   . Depression    . Bipolar disorder     Social History  Substance Use Topics  . Smoking status: Former Smoker    Quit date: 10/23/2007  . Smokeless tobacco: Never Used  . Alcohol Use: 0.0 oz/week    0 Standard drinks or equivalent  per week     Comment: Occ   OB History    Gravida Para Term Preterm AB TAB SAB Ectopic Multiple Living   3 2 2  0 1 0 1 0 0 2     Review of Systems  Constitutional: Negative for fever.  Respiratory: Negative for shortness of breath.   Cardiovascular: Negative for chest pain.  Gastrointestinal: Negative for nausea, vomiting and abdominal pain.  Musculoskeletal: Positive for arthralgias. Negative for joint swelling.  Skin: Negative for color change.  Neurological: Negative for weakness.      Allergies  Ketorolac tromethamine; Morphine; and Latex  Home Medications   Prior to Admission medications   Medication Sig Start Date End Date Taking? Authorizing Provider  albuterol (PROAIR HFA) 108 (90 BASE) MCG/ACT inhaler Inhale 2 puffs into the lungs every 6 (six) hours as needed for wheezing or shortness of breath. 05/22/14   Lelon Perla, DO  amphetamine-dextroamphetamine (ADDERALL) 30 MG tablet Take 1 tablet by mouth 2 (two) times daily. 12/22/14   Lelon Perla, DO  DULoxetine (CYMBALTA) 30 MG capsule Take 2 capsules (60 mg total) by mouth daily. 02/01/15   Lelon Perla, DO  HYDROcodone-acetaminophen (NORCO) 10-325 MG per tablet Take 1 tablet by  mouth every 6 (six) hours as needed. 02/25/15   Lelon Perla, DO  medroxyPROGESTERone (DEPO-PROVERA) 150 MG/ML injection Inject 150 mg into the muscle every 3 (three) months.    Historical Provider, MD  omeprazole (PRILOSEC) 40 MG capsule Take 1 capsule (40 mg total) by mouth 2 (two) times daily before a meal. 08/27/14   Lelon Perla, DO  SUMAtriptan (IMITREX) 50 MG tablet Take 1 tablet (50 mg total) by mouth once. May repeat in 2 hours if headache persists or recurs. 04/17/14   Sheliah Hatch, MD  tiZANidine (ZANAFLEX) 4 MG tablet Take 1 tablet (4 mg total) by mouth every 6 (six) hours as needed for muscle spasms. 02/24/15   Lelon Perla, DO  traMADol (ULTRAM) 50 MG tablet Take 1 tablet (50 mg total) by mouth every 8 (eight) hours as  needed. 02/25/15   Lelon Perla, DO  triamcinolone (NASACORT AQ) 55 MCG/ACT AERO nasal inhaler Place 2 sprays into the nose daily. 07/15/14   Lelon Perla, DO  valACYclovir (VALTREX) 1000 MG tablet Take 1 tablet (1,000 mg total) by mouth 3 (three) times daily. Patient not taking: Reported on 11/06/2014 08/28/14   Grayling Congress Lowne, DO   BP 144/88 mmHg  Pulse 80  Temp(Src) 98 F (36.7 C) (Oral)  Resp 16  SpO2 97%  LMP 03/13/2015 (Exact Date) Physical Exam  Constitutional: She is oriented to person, place, and time. She appears well-developed and well-nourished. No distress.  HENT:  Head: Normocephalic and atraumatic.  Neck: Normal range of motion.  Cardiovascular: Normal rate and regular rhythm.   Palpable pedal pulses. Cap refill < 3  Pulmonary/Chest: Effort normal and breath sounds normal. No respiratory distress. She has no wheezes. She has no rales.  Musculoskeletal: Normal range of motion.  No TTP of left knee. No edema present. Passive ROM of left knee and ankle intact. No crepitations on palpation. No MCL or ACL laxity. Negative anterior drawer.   Neurological: She is alert and oriented to person, place, and time.  5/5 extension and flexion. Sensation intact over left lower extremity.   Skin: Skin is warm and dry. No erythema.  Psychiatric: She has a normal mood and affect. Her behavior is normal.    ED Course  Procedures (including critical care time) Labs Review Labs Reviewed - No data to display  Imaging Review No results found. I have personally reviewed and evaluated these images and lab results as part of my medical decision-making.   EKG Interpretation None      MDM   Final diagnoses:  Osteoarthritis of left knee, unspecified osteoarthritis type  Knee injury, left, initial encounter   Arthritis, left knee Pt presenting with worsening left knee pain over past two days with acute worsening while walking up the stairs and feeling a "pop". No immediate swelling  or laxity of the knee. Pt has history of left knee osteoarthritis. VSS, nontoxic appearing. Benign knee exam. No edema or color change of knee. No instability. Gave pt 400 mg ibuprofen in ED with symptom improvement. Pt instructed on RICE protocol. Will stop by [facility] on way home to pick up a knee compression brace. Will call PCP Dr. Althea Charon to schedule follow up appointment for next week. Return to ED with fevers, rapid swelling, erythema of knee, inability to bend knee or further worsening of problems. Pt agrees with this plan.    Rolm Gala Dailen Mcclish, PA-C 03/20/15 0128  Tomasita Crumble, MD 03/20/15 [phone]

## 2015-03-20 NOTE — ED Notes (Signed)
Patient here with complaint of left knee pain. States she has had lateral pain in that knee for about 2 days. Tonight was working, walking up stairs, bent left knee to step up, and felt a pop. Since that time pain has been increased. Patient is ambulatory. Pain is posterior lateral left knee at this time.

## 2015-03-20 NOTE — Discharge Instructions (Signed)
-   RICE protocol for knee. Rest, Ice, Compression and Elevation - Can buy a compression knee brace at [facility] if needed - Continue taking advil for pain control - Call Dr. Maebelle Munroe office to schedule a follow up appointment for next week - Return to ED with rapid swelling, redness, inability to bend the knee, fevers or further worsening of symptoms

## 2015-03-24 ENCOUNTER — Other Ambulatory Visit: Payer: Self-pay | Admitting: Family Medicine

## 2015-03-24 ENCOUNTER — Encounter: Payer: Self-pay | Admitting: Family Medicine

## 2015-03-24 ENCOUNTER — Telehealth: Payer: Self-pay | Admitting: Family Medicine

## 2015-03-24 DIAGNOSIS — F988 Other specified behavioral and emotional disorders with onset usually occurring in childhood and adolescence: Secondary | ICD-10-CM

## 2015-03-24 DIAGNOSIS — G43011 Migraine without aura, intractable, with status migrainosus: Secondary | ICD-10-CM

## 2015-03-24 DIAGNOSIS — M797 Fibromyalgia: Secondary | ICD-10-CM

## 2015-03-24 MED ORDER — AMPHETAMINE-DEXTROAMPHETAMINE 30 MG PO TABS
30.0000 mg | ORAL_TABLET | Freq: Two times a day (BID) | ORAL | Status: DC
Start: 2015-03-24 — End: 2015-07-05

## 2015-03-24 MED ORDER — TIZANIDINE HCL 4 MG PO TABS: 4.0000 mg | ORAL_TABLET | Freq: Four times a day (QID) | ORAL | Status: DC | PRN

## 2015-03-24 MED ORDER — DULOXETINE HCL 60 MG PO CPEP: 60.0000 mg | ORAL_CAPSULE | Freq: Every day | ORAL | Status: DC

## 2015-03-24 NOTE — Telephone Encounter (Signed)
Last OV 10/19/14 adderall last filled 12/22/14 #180 with 0   CSC, UDS needed

## 2015-03-24 NOTE — Telephone Encounter (Signed)
Caller name: Lyrik Relation to pt: self Call back number:[phone] Pharmacy:   Reason for call: Pt came in office requesting rx for amphetamine-dextroamphetamine (ADDERALL) 30 MG tablet. Please advise.

## 2015-03-24 NOTE — Telephone Encounter (Signed)
Medication filled to pharmacy as requested.   

## 2015-03-24 NOTE — Telephone Encounter (Signed)
Refill ok for 3 months 

## 2015-03-24 NOTE — Telephone Encounter (Signed)
Med printed and given to PCP for signature.  

## 2015-04-06 ENCOUNTER — Ambulatory Visit: Payer: Self-pay | Admitting: Family Medicine

## 2015-04-07 ENCOUNTER — Encounter: Payer: Self-pay | Admitting: Family Medicine

## 2015-04-07 ENCOUNTER — Ambulatory Visit (INDEPENDENT_AMBULATORY_CARE_PROVIDER_SITE_OTHER): Payer: BLUE CROSS/BLUE SHIELD | Admitting: Family Medicine

## 2015-04-07 VITALS — BP 140/82 | HR 91 | Temp 98.1°F | Resp 16 | Wt 292.2 lb

## 2015-04-07 DIAGNOSIS — H938X1 Other specified disorders of right ear: Secondary | ICD-10-CM | POA: Diagnosis not present

## 2015-04-07 MED ORDER — PREDNISONE 10 MG PO TABS: ORAL_TABLET | ORAL | Status: DC

## 2015-04-07 NOTE — Progress Notes (Signed)
   Subjective:    Patient ID: Jo Jackson, female    DOB: [birth year], 44 y.o.   MRN: [id number]  HPI   'my ear is roaring again'- R sided.  + nasal congestion.  Denies sinus pain/pressure.  sxs started 2 days ago.  No fever.  Taking Zyrtec.  Using Nasacort daily.  Hx of similar that required 3 rounds of abx to treat.  + dizziness.  Pt finished course of prednisone ~1 week ago.  sxs started after completion of prednisone.   Review of Systems For ROS see HPI     Objective:   Physical Exam  Constitutional: She appears well-developed and well-nourished. No distress.  HENT:  Head: Normocephalic and atraumatic.  Right Ear: Tympanic membrane normal.  Left Ear: Tympanic membrane normal.  Nose: Mucosal edema and rhinorrhea present. Right sinus exhibits no maxillary sinus tenderness and no frontal sinus tenderness. Left sinus exhibits no maxillary sinus tenderness and no frontal sinus tenderness.  Mouth/Throat: Mucous membranes are normal. Posterior oropharyngeal erythema (w/ PND) present.  Eyes: Conjunctivae and EOM are normal. Pupils are equal, round, and reactive to light.  Neck: Normal range of motion. Neck supple.  Cardiovascular: Normal rate, regular rhythm and normal heart sounds.   Pulmonary/Chest: Effort normal and breath sounds normal. No respiratory distress. She has no wheezes. She has no rales.  Lymphadenopathy:    She has no cervical adenopathy.  Vitals reviewed.         Assessment & Plan:

## 2015-04-07 NOTE — Assessment & Plan Note (Signed)
New to provider, hx of similar for pt.  She describes sxs as a 'roaring in my ear' and this was associated w/ dizziness this AM.  No evidence of bacterial infxn at this time so no need for abx.  She has obvious allergic rhinitis on PE despite using OTC antihistamine and nasal steroid.  Her allergies could be causing her ear sxs.  She could possibly have a labyrinthitis.  Start pred taper and monitor for improvement.  If no better in 1 week, will need ENT referral.  Pt expressed understanding and is in agreement w/ plan.

## 2015-04-07 NOTE — Progress Notes (Signed)
Pre visit review using our clinic review tool, if applicable. No additional management support is needed unless otherwise documented below in the visit note. 

## 2015-04-07 NOTE — Patient Instructions (Signed)
This is consistent w/ allergic inflammation or inner ear inflammation Start the prednisone as directed- take w/ food There is no evidence of infection- this is great news! If no improvement in the ear congestion, roaring, or fullness in the next week- please call for referral to ENT Call with any questions or concerns Hang in there!

## 2015-04-22 ENCOUNTER — Encounter: Payer: Self-pay | Admitting: Family Medicine

## 2015-04-22 ENCOUNTER — Ambulatory Visit (INDEPENDENT_AMBULATORY_CARE_PROVIDER_SITE_OTHER): Payer: BLUE CROSS/BLUE SHIELD | Admitting: Family Medicine

## 2015-04-22 VITALS — BP 113/87 | HR 88 | Temp 98.1°F | Ht 67.0 in | Wt 299.0 lb

## 2015-04-22 DIAGNOSIS — M797 Fibromyalgia: Secondary | ICD-10-CM | POA: Diagnosis not present

## 2015-04-22 DIAGNOSIS — Z23 Encounter for immunization: Secondary | ICD-10-CM

## 2015-04-22 DIAGNOSIS — M25562 Pain in left knee: Secondary | ICD-10-CM

## 2015-04-22 DIAGNOSIS — R109 Unspecified abdominal pain: Secondary | ICD-10-CM | POA: Diagnosis not present

## 2015-04-22 MED ORDER — MELOXICAM 15 MG PO TABS: 15.0000 mg | ORAL_TABLET | Freq: Every day | ORAL | Status: DC

## 2015-04-22 MED ORDER — OMEPRAZOLE 40 MG PO CPDR: 40.0000 mg | DELAYED_RELEASE_CAPSULE | Freq: Two times a day (BID) | ORAL | Status: DC

## 2015-04-22 MED ORDER — DULOXETINE HCL 60 MG PO CPEP: 60.0000 mg | ORAL_CAPSULE | Freq: Every day | ORAL | Status: DC

## 2015-04-22 NOTE — Progress Notes (Signed)
Patient ID: Jo Jackson, female    DOB: 1971-06-22  Age: 44 y.o. MRN: [id number]    Subjective:  Subjective HPI Jo Jackson presents for f/u gerd and fibromyalgia ---- pt needs refillls.  No other complaints.    Review of Systems  Constitutional: Negative for diaphoresis, appetite change, fatigue and unexpected weight change.  Eyes: Negative for pain, redness and visual disturbance.  Respiratory: Negative for cough, chest tightness, shortness of breath and wheezing.   Cardiovascular: Negative for chest pain, palpitations and leg swelling.  Endocrine: Negative for cold intolerance, heat intolerance, polydipsia, polyphagia and polyuria.  Genitourinary: Negative for dysuria, frequency and difficulty urinating.  Musculoskeletal: Positive for joint swelling, arthralgias and gait problem.  Neurological: Negative for dizziness, light-headedness, numbness and headaches.    History Past Medical History  Diagnosis Date  . ADD (attention deficit disorder)   . Junctional escape rhythm   . Supraventricular tachycardia   . Headache(784.0)     Migraines  . Anxiety   . Depression   . Pregnancy induced hypertension   . Abnormal Pap smear     Repeats WNL  . Arthritis     Oseteoarthritis  . GERD (gastroesophageal reflux disease)   . Neuromuscular disorder     Fibromyalgia    She has past surgical history that includes Appendectomy (1986); Carpal tunnel release (2000); Cervical fusion (08-24-99,04-26-00,05-09-01); Shoulder surgery (left-6-04, right 09-12-04); tongue polyp (1984); Ablation of dysrhythmic focus; and Knee sugery (Left).   Her family history includes Bipolar disorder in an other family member; Depression in an other family member; Diabetes in an other family member; Hodgkin's lymphoma in her father; Hypertension in an other family member; Thyroid disease in an other family member.She reports that she quit smoking about 7 years ago. She has never used smokeless tobacco. She  reports that she drinks alcohol. She reports that she does not use illicit drugs.  Current Outpatient Prescriptions on File Prior to Visit  Medication Sig Dispense Refill  . albuterol (PROAIR HFA) 108 (90 BASE) MCG/ACT inhaler Inhale 2 puffs into the lungs every 6 (six) hours as needed for wheezing or shortness of breath. 1 Inhaler 2  . amphetamine-dextroamphetamine (ADDERALL) 30 MG tablet Take 1 tablet by mouth 2 (two) times daily. 180 tablet 0  . HYDROcodone-acetaminophen (NORCO) 10-325 MG per tablet Take 1 tablet by mouth every 6 (six) hours as needed. 30 tablet 0  . predniSONE (DELTASONE) 10 MG tablet 3 tabs x3 days and then 2 tabs x3 days and then 1 tab x3 days.  Take w/ food. 18 tablet 0  . SUMAtriptan (IMITREX) 50 MG tablet Take 1 tablet (50 mg total) by mouth once. May repeat in 2 hours if headache persists or recurs. 10 tablet 3  . tiZANidine (ZANAFLEX) 4 MG tablet Take 1 tablet (4 mg total) by mouth every 6 (six) hours as needed for muscle spasms. 30 tablet 0  . traMADol (ULTRAM) 50 MG tablet Take 1 tablet (50 mg total) by mouth every 8 (eight) hours as needed. 30 tablet 0  . triamcinolone (NASACORT AQ) 55 MCG/ACT AERO nasal inhaler Place 2 sprays into the nose daily. 1 Inhaler 12  . valACYclovir (VALTREX) 1000 MG tablet Take 1 tablet (1,000 mg total) by mouth 3 (three) times daily. 30 tablet 5   No current facility-administered medications on file prior to visit.     Objective:  Objective Physical Exam  Constitutional: She is oriented to person, place, and time. She appears well-developed and well-nourished.  HENT:  Head:  Normocephalic and atraumatic.  Eyes: Conjunctivae and EOM are normal.  Neck: Normal range of motion. Neck supple. No JVD present. Carotid bruit is not present. No thyromegaly present.  Cardiovascular: Normal rate, regular rhythm and normal heart sounds.   No murmur heard. Pulmonary/Chest: Effort normal and breath sounds normal. No respiratory distress. She has  no wheezes. She has no rales. She exhibits no tenderness.  Neurological: She is alert and oriented to person, place, and time.  Psychiatric: She has a normal mood and affect. Her behavior is normal.   BP 113/87 mmHg  Pulse 88  Temp(Src) 98.1 F (36.7 C) (Oral)  Ht  (1.702 m)  Wt 299 lb (135.626 kg)  BMI 46.82 kg/m2  SpO2 98% Wt Readings from Last 3 Encounters:  04/22/15 299 lb (135.626 kg)  04/07/15 292 lb 4 oz (132.564 kg)  11/06/14 281 lb (127.461 kg)     Lab Results  Component Value Date   WBC 4.2 09/26/2013   HGB 13.5 09/26/2013   HCT 39.3 09/26/2013   PLT 229 09/26/2013   GLUCOSE 85 09/26/2013   CHOL 203* 04/10/2008   TRIG 56 04/10/2008   HDL 82 04/10/2008   LDLCALC 110* 04/10/2008   ALT 25 09/26/2013   AST 20 09/26/2013   NA 139 09/26/2013   K 4.1 09/26/2013   CL 102 09/26/2013   CREATININE 0.64 09/26/2013   BUN 10 09/26/2013   CO2 28 09/26/2013   TSH 2.456 06/06/2013   HGBA1C 5.4 06/06/2013    No results found.   Assessment & Plan:  Plan I am having Jo Jackson start on meloxicam. I am also having her maintain her SUMAtriptan, albuterol, triamcinolone, valACYclovir, traMADol, HYDROcodone-acetaminophen, tiZANidine, amphetamine-dextroamphetamine, predniSONE, omeprazole, and DULoxetine.  Meds ordered this encounter  Medications  . omeprazole (PRILOSEC) 40 MG capsule    Sig: Take 1 capsule (40 mg total) by mouth 2 (two) times daily before a meal.    Dispense:  180 capsule    Refill:  3  . DULoxetine (CYMBALTA) 60 MG capsule    Sig: Take 1 capsule (60 mg total) by mouth daily.    Dispense:  90 capsule    Refill:  3  . meloxicam (MOBIC) 15 MG tablet    Sig: Take 1 tablet (15 mg total) by mouth daily.    Dispense:  30 tablet    Refill:  0    Problem List Items Addressed This Visit    None    Visit Diagnoses    Abdominal pain, unspecified abdominal location    -  Primary    Relevant Medications    omeprazole (PRILOSEC) 40 MG capsule     Fibromyalgia        Relevant Medications    DULoxetine (CYMBALTA) 60 MG capsule    meloxicam (MOBIC) 15 MG tablet    Knee pain, acute, left        Relevant Medications    meloxicam (MOBIC) 15 MG tablet    Encounter for immunization                     Flu shot given  Follow-up: Return in about 6 months (around 10/20/2015), or if symptoms worsen or fail to improve.  Loreen Freud, DO

## 2015-04-22 NOTE — Progress Notes (Signed)
Pre visit review using our clinic review tool, if applicable. No additional management support is needed unless otherwise documented below in the visit note. 

## 2015-04-22 NOTE — Patient Instructions (Signed)

## 2015-05-26 ENCOUNTER — Other Ambulatory Visit: Payer: Self-pay | Admitting: Family Medicine

## 2015-05-26 DIAGNOSIS — G43809 Other migraine, not intractable, without status migrainosus: Secondary | ICD-10-CM

## 2015-05-26 DIAGNOSIS — M797 Fibromyalgia: Secondary | ICD-10-CM

## 2015-05-26 DIAGNOSIS — G43011 Migraine without aura, intractable, with status migrainosus: Secondary | ICD-10-CM

## 2015-05-26 DIAGNOSIS — M25562 Pain in left knee: Secondary | ICD-10-CM

## 2015-05-27 MED ORDER — MELOXICAM 15 MG PO TABS: 15.0000 mg | ORAL_TABLET | Freq: Every day | ORAL | Status: DC

## 2015-05-27 MED ORDER — HYDROCODONE-ACETAMINOPHEN 10-325 MG PO TABS: 1.0000 | ORAL_TABLET | Freq: Four times a day (QID) | ORAL | Status: DC | PRN

## 2015-05-27 MED ORDER — TIZANIDINE HCL 4 MG PO TABS: 4.0000 mg | ORAL_TABLET | Freq: Four times a day (QID) | ORAL | Status: DC | PRN

## 2015-05-27 MED ORDER — TRAMADOL HCL 50 MG PO TABS: 50.0000 mg | ORAL_TABLET | Freq: Three times a day (TID) | ORAL | Status: DC | PRN

## 2015-05-27 NOTE — Telephone Encounter (Signed)
Hydrocodone and Tramadol filled 02/25/15 #30  Please advise     KP

## 2015-06-02 ENCOUNTER — Telehealth: Payer: Self-pay | Admitting: Family Medicine

## 2015-06-02 NOTE — Telephone Encounter (Signed)
Caller name: Self   Can be reached: [phone]   Reason for call: FYI: To inform Dr. Laury AxonLowne that she had Laparoscopic knee surgery on 05/28/2015. She was prescribed Percocet 5/325 for pain.

## 2015-06-03 NOTE — Telephone Encounter (Signed)
To MD to make her aware.      KP 

## 2015-06-04 ENCOUNTER — Encounter: Payer: Self-pay | Admitting: Family Medicine

## 2015-06-04 ENCOUNTER — Ambulatory Visit (INDEPENDENT_AMBULATORY_CARE_PROVIDER_SITE_OTHER): Payer: BLUE CROSS/BLUE SHIELD | Admitting: Family Medicine

## 2015-06-04 VITALS — BP 122/88 | HR 79 | Temp 98.3°F | Ht 67.0 in | Wt 305.0 lb

## 2015-06-04 DIAGNOSIS — J208 Acute bronchitis due to other specified organisms: Secondary | ICD-10-CM

## 2015-06-04 DIAGNOSIS — J209 Acute bronchitis, unspecified: Secondary | ICD-10-CM | POA: Insufficient documentation

## 2015-06-04 DIAGNOSIS — J40 Bronchitis, not specified as acute or chronic: Secondary | ICD-10-CM | POA: Insufficient documentation

## 2015-06-04 DIAGNOSIS — J0110 Acute frontal sinusitis, unspecified: Secondary | ICD-10-CM | POA: Diagnosis not present

## 2015-06-04 DIAGNOSIS — J019 Acute sinusitis, unspecified: Secondary | ICD-10-CM | POA: Diagnosis not present

## 2015-06-04 MED ORDER — ALBUTEROL SULFATE HFA 108 (90 BASE) MCG/ACT IN AERS: 2.0000 | INHALATION_SPRAY | Freq: Four times a day (QID) | RESPIRATORY_TRACT | Status: DC | PRN

## 2015-06-04 MED ORDER — PREDNISONE 10 MG PO TABS
ORAL_TABLET | ORAL | Status: DC
Start: 2015-06-04 — End: 2015-07-08

## 2015-06-04 MED ORDER — AMOXICILLIN-POT CLAVULANATE 875-125 MG PO TABS
1.0000 | ORAL_TABLET | Freq: Two times a day (BID) | ORAL | Status: DC
Start: 2015-06-04 — End: 2015-07-08

## 2015-06-04 NOTE — Assessment & Plan Note (Signed)
con't flonase and antihistamine abx per orders pred taper

## 2015-06-04 NOTE — Progress Notes (Signed)
Pre visit review using our clinic review tool, if applicable. No additional management support is needed unless otherwise documented below in the visit note. 

## 2015-06-04 NOTE — Patient Instructions (Signed)

## 2015-06-04 NOTE — Assessment & Plan Note (Signed)
Refilled inhalers pred taper rto prn

## 2015-06-04 NOTE — Progress Notes (Signed)
Patient ID: Jo Jackson, female    DOB: Jul 17, 1971  Age: 44 y.o. MRN: [id number]    Subjective:  Subjective HPI Jo Jackson presents with c/o sinus congestion x 1 month.  Pt states she has tried Mucinex, zyrtec, nasacort, albuterol and many other otc with no relief.    Review of Systems  Constitutional: Negative for fever and chills.  HENT: Positive for congestion, postnasal drip, rhinorrhea, sinus pressure and sore throat.   Respiratory: Positive for cough, chest tightness, shortness of breath and wheezing.   Cardiovascular: Negative for chest pain, palpitations and leg swelling.  Allergic/Immunologic: Negative for environmental allergies.    History Past Medical History  Diagnosis Date  . ADD (attention deficit disorder)   . Junctional escape rhythm   . Supraventricular tachycardia (HCC)   . Headache(784.0)     Migraines  . Anxiety   . Depression   . Pregnancy induced hypertension   . Abnormal Pap smear     Repeats WNL  . Arthritis     Oseteoarthritis  . GERD (gastroesophageal reflux disease)   . Neuromuscular disorder (HCC)     Fibromyalgia    She has past surgical history that includes Appendectomy (1986); Carpal tunnel release (2000); Cervical fusion (08-24-99,04-26-00,05-09-01); Shoulder surgery (left-6-04, right 09-12-04); tongue polyp (1984); Ablation of dysrhythmic focus; and Knee sugery (Left).   Her family history includes Bipolar disorder in an other family member; Depression in an other family member; Diabetes in an other family member; Hodgkin's lymphoma in her father; Hypertension in an other family member; Thyroid disease in an other family member.She reports that she quit smoking about 7 years ago. She has never used smokeless tobacco. She reports that she drinks alcohol. She reports that she does not use illicit drugs.  Current Outpatient Prescriptions on File Prior to Visit  Medication Sig Dispense Refill  . amphetamine-dextroamphetamine  (ADDERALL) 30 MG tablet Take 1 tablet by mouth 2 (two) times daily. 180 tablet 0  . DULoxetine (CYMBALTA) 60 MG capsule Take 1 capsule (60 mg total) by mouth daily. 90 capsule 3  . HYDROcodone-acetaminophen (NORCO) 10-325 MG tablet Take 1 tablet by mouth every 6 (six) hours as needed. 30 tablet 0  . meloxicam (MOBIC) 15 MG tablet Take 1 tablet (15 mg total) by mouth daily. 90 tablet 0  . omeprazole (PRILOSEC) 40 MG capsule Take 1 capsule (40 mg total) by mouth 2 (two) times daily before a meal. 180 capsule 3  . SUMAtriptan (IMITREX) 50 MG tablet Take 1 tablet (50 mg total) by mouth once. May repeat in 2 hours if headache persists or recurs. 10 tablet 3  . tiZANidine (ZANAFLEX) 4 MG tablet Take 1 tablet (4 mg total) by mouth every 6 (six) hours as needed for muscle spasms. 30 tablet 0  . traMADol (ULTRAM) 50 MG tablet Take 1 tablet (50 mg total) by mouth every 8 (eight) hours as needed. 30 tablet 0  . triamcinolone (NASACORT AQ) 55 MCG/ACT AERO nasal inhaler Place 2 sprays into the nose daily. 1 Inhaler 12  . valACYclovir (VALTREX) 1000 MG tablet Take 1 tablet (1,000 mg total) by mouth 3 (three) times daily. 30 tablet 5   No current facility-administered medications on file prior to visit.     Objective:  Objective Physical Exam  Constitutional: She is oriented to person, place, and time. She appears well-developed and well-nourished.  HENT:  Right Ear: Hearing, tympanic membrane, external ear and ear canal normal.  Left Ear: Hearing, tympanic membrane, external ear and ear  canal normal.  Nose: Right sinus exhibits maxillary sinus tenderness and frontal sinus tenderness. Left sinus exhibits maxillary sinus tenderness and frontal sinus tenderness.  Mouth/Throat: Posterior oropharyngeal erythema present. No oropharyngeal exudate or posterior oropharyngeal edema.  + PND + errythema  Eyes: Conjunctivae are normal. Right eye exhibits no discharge. Left eye exhibits no discharge.  Cardiovascular:  Normal rate, regular rhythm and normal heart sounds.   No murmur heard. Pulmonary/Chest: Effort normal. No respiratory distress. She has decreased breath sounds. She has no wheezes. She has no rales. She exhibits no tenderness.  Musculoskeletal: She exhibits no edema.  Lymphadenopathy:    She has cervical adenopathy.  Neurological: She is alert and oriented to person, place, and time.  Nursing note and vitals reviewed.  BP 122/88 mmHg  Pulse 79  Temp(Src) 98.3 F (36.8 C) (Oral)  Ht 5\' 7"  (1.702 m)  Wt 305 lb (138.347 kg)  BMI 47.76 kg/m2  SpO2 98% Wt Readings from Last 3 Encounters:  06/04/15 305 lb (138.347 kg)  04/22/15 299 lb (135.626 kg)  04/07/15 292 lb 4 oz (132.564 kg)     Lab Results  Component Value Date   WBC 4.2 09/26/2013   HGB 13.5 09/26/2013   HCT 39.3 09/26/2013   PLT 229 09/26/2013   GLUCOSE 85 09/26/2013   CHOL 203* 04/10/2008   TRIG 56 04/10/2008   HDL 82 04/10/2008   LDLCALC 110* 04/10/2008   ALT 25 09/26/2013   AST 20 09/26/2013   NA 139 09/26/2013   K 4.1 09/26/2013   CL 102 09/26/2013   CREATININE 0.64 09/26/2013   BUN 10 09/26/2013   CO2 28 09/26/2013   TSH 2.456 06/06/2013   HGBA1C 5.4 06/06/2013    No results found.   Assessment & Plan:  Plan I am having Ms. Choplin start on amoxicillin-clavulanate. I am also having her maintain her SUMAtriptan, triamcinolone, valACYclovir, amphetamine-dextroamphetamine, omeprazole, DULoxetine, traMADol, HYDROcodone-acetaminophen, tiZANidine, meloxicam, albuterol, and predniSONE.  Meds ordered this encounter  Medications  . amoxicillin-clavulanate (AUGMENTIN) 875-125 MG tablet    Sig: Take 1 tablet by mouth 2 (two) times daily.    Dispense:  20 tablet    Refill:  0  . albuterol (PROAIR HFA) 108 (90 BASE) MCG/ACT inhaler    Sig: Inhale 2 puffs into the lungs every 6 (six) hours as needed for wheezing or shortness of breath.    Dispense:  1 Inhaler    Refill:  2  . predniSONE (DELTASONE) 10 MG  tablet    Sig: 3 tabs x3 days and then 2 tabs x3 days and then 1 tab x3 days.  Take w/ food.    Dispense:  18 tablet    Refill:  0    Problem List Items Addressed This Visit    SINUSITIS - ACUTE-NOS    con't flonase and antihistamine abx per orders pred taper      Relevant Medications   amoxicillin-clavulanate (AUGMENTIN) 875-125 MG tablet   predniSONE (DELTASONE) 10 MG tablet   Acute bronchitis    Refilled inhalers pred taper rto prn       Other Visit Diagnoses    Acute frontal sinusitis, recurrence not specified    -  Primary    Relevant Medications    amoxicillin-clavulanate (AUGMENTIN) 875-125 MG tablet    predniSONE (DELTASONE) 10 MG tablet    Acute bronchitis due to other specified organisms        Relevant Medications    albuterol (PROAIR HFA) 108 (90 BASE) MCG/ACT inhaler  predniSONE (DELTASONE) 10 MG tablet       Follow-up: Return if symptoms worsen or fail to improve.  Loreen Freud, DO

## 2015-06-04 NOTE — Telephone Encounter (Signed)
noted 

## 2015-07-04 ENCOUNTER — Other Ambulatory Visit: Payer: Self-pay | Admitting: Family Medicine

## 2015-07-04 DIAGNOSIS — G43011 Migraine without aura, intractable, with status migrainosus: Secondary | ICD-10-CM

## 2015-07-04 DIAGNOSIS — F988 Other specified behavioral and emotional disorders with onset usually occurring in childhood and adolescence: Secondary | ICD-10-CM

## 2015-07-05 ENCOUNTER — Telehealth: Payer: Self-pay | Admitting: Family Medicine

## 2015-07-05 ENCOUNTER — Other Ambulatory Visit: Payer: Self-pay | Admitting: Family Medicine

## 2015-07-05 DIAGNOSIS — J329 Chronic sinusitis, unspecified: Secondary | ICD-10-CM

## 2015-07-05 MED ORDER — TIZANIDINE HCL 4 MG PO TABS: 4.0000 mg | ORAL_TABLET | Freq: Four times a day (QID) | ORAL | Status: DC | PRN

## 2015-07-05 MED ORDER — AMPHETAMINE-DEXTROAMPHETAMINE 30 MG PO TABS
30.0000 mg | ORAL_TABLET | Freq: Two times a day (BID) | ORAL | Status: DC
Start: 2015-07-05 — End: 2015-10-21

## 2015-07-05 NOTE — Telephone Encounter (Signed)
Sinus ct ordered--- keep ov

## 2015-07-05 NOTE — Telephone Encounter (Signed)
Relation to ZO:XWRUpt:self Call back number:[phone]   Reason for call:  Patient states ear ache has not improved since 06/04/2015 patient requesting orders for "sinus CT"  [facility]

## 2015-07-05 NOTE — Telephone Encounter (Signed)
Patient is requesting CT. Please advise     KP

## 2015-07-05 NOTE — Telephone Encounter (Signed)
C/o: "Roaring" in right ear.  Was present at last visit. Ear was assessed.  Nothing was found.  Denies pressure, headaches, nasal congestion, post nasal drip, dizziness, fever, etc.  Just the roaring in ear.  Appt scheduled with Dr. Laury AxonLowne 07/08/15.

## 2015-07-08 ENCOUNTER — Encounter: Payer: Self-pay | Admitting: Family Medicine

## 2015-07-08 ENCOUNTER — Ambulatory Visit (INDEPENDENT_AMBULATORY_CARE_PROVIDER_SITE_OTHER): Payer: BLUE CROSS/BLUE SHIELD | Admitting: Family Medicine

## 2015-07-08 VITALS — BP 134/82 | HR 88 | Temp 98.2°F | Ht 67.0 in | Wt 313.0 lb

## 2015-07-08 DIAGNOSIS — H9201 Otalgia, right ear: Secondary | ICD-10-CM

## 2015-07-08 NOTE — Progress Notes (Signed)
Pre visit review using our clinic review tool, if applicable. No additional management support is needed unless otherwise documented below in the visit note. 

## 2015-07-08 NOTE — Progress Notes (Signed)
Patient ID: Jo Jackson Beebe, female    DOB: 05-03-1971  Age: 44 y.o. MRN: [id number]    Subjective:  Subjective HPI Jo Jackson Grange presents for c/o r ear pain.  It is actually a roaring sound that prevents her from hearing.  It goes away temporarily with predinsone but always comes back.    Review of Systems  Constitutional: Negative for diaphoresis, appetite change, fatigue and unexpected weight change.  HENT: Positive for ear pain and hearing loss. Negative for facial swelling, mouth sores, nosebleeds and postnasal drip.   Eyes: Negative for pain, redness and visual disturbance.  Respiratory: Negative for cough, chest tightness, shortness of breath and wheezing.   Cardiovascular: Negative for chest pain, palpitations and leg swelling.  Endocrine: Negative for cold intolerance, heat intolerance, polydipsia, polyphagia and polyuria.  Genitourinary: Negative for dysuria, frequency and difficulty urinating.  Neurological: Negative for dizziness, light-headedness, numbness and headaches.    History Past Medical History  Diagnosis Date  . ADD (attention deficit disorder)   . Junctional escape rhythm   . Supraventricular tachycardia (HCC)   . Headache(784.0)     Migraines  . Anxiety   . Depression   . Pregnancy induced hypertension   . Abnormal Pap smear     Repeats WNL  . Arthritis     Oseteoarthritis  . GERD (gastroesophageal reflux disease)   . Neuromuscular disorder (HCC)     Fibromyalgia    She has past surgical history that includes Appendectomy (1986); Carpal tunnel release (2000); Cervical fusion (08-24-99,04-26-00,05-09-01); Shoulder surgery (left-6-04, right 09-12-04); tongue polyp (1984); Ablation of dysrhythmic focus; and Knee sugery (Left).   Her family history includes Hodgkin's lymphoma in her father.She reports that she quit smoking about 7 years ago. She has never used smokeless tobacco. She reports that she drinks alcohol. She reports that she does not use  illicit drugs.  Current Outpatient Prescriptions on File Prior to Visit  Medication Sig Dispense Refill  . albuterol (PROAIR HFA) 108 (90 BASE) MCG/ACT inhaler Inhale 2 puffs into the lungs every 6 (six) hours as needed for wheezing or shortness of breath. 1 Inhaler 2  . amphetamine-dextroamphetamine (ADDERALL) 30 MG tablet Take 1 tablet by mouth 2 (two) times daily. 180 tablet 0  . DULoxetine (CYMBALTA) 60 MG capsule Take 1 capsule (60 mg total) by mouth daily. 90 capsule 3  . HYDROcodone-acetaminophen (NORCO) 10-325 MG tablet Take 1 tablet by mouth every 6 (six) hours as needed. 30 tablet 0  . meloxicam (MOBIC) 15 MG tablet Take 1 tablet (15 mg total) by mouth daily. 90 tablet 0  . omeprazole (PRILOSEC) 40 MG capsule Take 1 capsule (40 mg total) by mouth 2 (two) times daily before a meal. 180 capsule 3  . SUMAtriptan (IMITREX) 50 MG tablet Take 1 tablet (50 mg total) by mouth once. May repeat in 2 hours if headache persists or recurs. 10 tablet 3  . tiZANidine (ZANAFLEX) 4 MG tablet Take 1 tablet (4 mg total) by mouth every 6 (six) hours as needed for muscle spasms. 30 tablet 0  . traMADol (ULTRAM) 50 MG tablet Take 1 tablet (50 mg total) by mouth every 8 (eight) hours as needed. 30 tablet 0  . triamcinolone (NASACORT AQ) 55 MCG/ACT AERO nasal inhaler Place 2 sprays into the nose daily. 1 Inhaler 12  . valACYclovir (VALTREX) 1000 MG tablet Take 1 tablet (1,000 mg total) by mouth 3 (three) times daily. 30 tablet 5   No current facility-administered medications on file prior to visit.  Objective:  Objective Physical Exam  Constitutional: She is oriented to person, place, and time. She appears well-developed and well-nourished.  HENT:  Head: Normocephalic and atraumatic.  Eyes: Conjunctivae and EOM are normal.  Neck: Normal range of motion. Neck supple. No JVD present. Carotid bruit is not present. No thyromegaly present.  Cardiovascular: Normal rate, regular rhythm and normal heart  sounds.   No murmur heard. Pulmonary/Chest: Effort normal and breath sounds normal. No respiratory distress. She has no wheezes. She has no rales. She exhibits no tenderness.  Musculoskeletal: She exhibits no edema.  Neurological: She is alert and oriented to person, place, and time.  Psychiatric: She has a normal mood and affect. Her behavior is normal.  Nursing note and vitals reviewed.  BP 134/82 mmHg  Pulse 88  Temp(Src) 98.2 F (36.8 C) (Oral)  Ht  (1.702 m)  Wt 313 lb (141.976 kg)  BMI 49.01 kg/m2  SpO2 97% Wt Readings from Last 3 Encounters:  07/08/15 313 lb (141.976 kg)  06/04/15 305 lb (138.347 kg)  04/22/15 299 lb (135.626 kg)     Lab Results  Component Value Date   WBC 4.2 09/26/2013   HGB 13.5 09/26/2013   HCT 39.3 09/26/2013   PLT 229 09/26/2013   GLUCOSE 85 09/26/2013   CHOL 203* 04/10/2008   TRIG 56 04/10/2008   HDL 82 04/10/2008   LDLCALC 110* 04/10/2008   ALT 25 09/26/2013   AST 20 09/26/2013   NA 139 09/26/2013   K 4.1 09/26/2013   CL 102 09/26/2013   CREATININE 0.64 09/26/2013   BUN 10 09/26/2013   CO2 28 09/26/2013   TSH 2.456 06/06/2013   HGBA1C 5.4 06/06/2013    No results found.   Assessment & Plan:  Plan I have discontinued Ms. Gatton's amoxicillin-clavulanate and predniSONE. I am also having her maintain her SUMAtriptan, triamcinolone, valACYclovir, omeprazole, DULoxetine, traMADol, HYDROcodone-acetaminophen, meloxicam, albuterol, amphetamine-dextroamphetamine, and tiZANidine.  No orders of the defined types were placed in this encounter.    Problem List Items Addressed This Visit    None    Visit Diagnoses    Otalgia, right    -  Primary    Relevant Orders    Ambulatory referral to ENT       Follow-up: Return if symptoms worsen or fail to improve.  Loreen Freud, DO

## 2015-07-08 NOTE — Patient Instructions (Signed)
Earache An earache, also called otalgia, can be caused by many things. Pain from an earache can be sharp, dull, or burning. The pain may be temporary or constant. Earaches can be caused by problems with the ear, such as infection in either the middle ear or the ear canal, injury, impacted ear wax, middle ear pressure, or a foreign body in the ear. Ear pain can also result from problems in other areas. This is called referred pain. For example, pain can come from a sore throat, a tooth infection, or problems with the jaw or the joint between the jaw and the skull (temporomandibular joint, or TMJ). The cause of an earache is not always easy to identify. Watchful waiting may be appropriate for some earaches until a clear cause of the pain can be found. HOME CARE INSTRUCTIONS Watch your condition for any changes. The following actions may help to lessen any discomfort that you are feeling:  Take medicines only as directed by your health care provider. This includes ear drops.  Apply ice to your outer ear to help reduce pain.  Put ice in a plastic bag.  Place a towel between your skin and the bag.  Leave the ice on for 20 minutes, 2-3 times per day.  Do not put anything in your ear other than medicine that is prescribed by your health care provider.  Try resting in an upright position instead of lying down. This may help to reduce pressure in the middle ear and relieve pain.  Chew gum if it helps to relieve your ear pain.  Control any allergies that you have.  Keep all follow-up visits as directed by your health care provider. This is important. SEEK MEDICAL CARE IF:  Your pain does not improve within 2 days.  You have a fever.  You have new or worsening symptoms. SEEK IMMEDIATE MEDICAL CARE IF:  You have a severe headache.  You have a stiff neck.  You have difficulty swallowing.  You have redness or swelling behind your ear.  You have drainage from your ear.  You have hearing  loss.  You feel dizzy.   This information is not intended to replace advice given to you by your health care provider. Make sure you discuss any questions you have with your health care provider.   Document Released: 02/25/2004 Document Revised: 07/31/2014 Document Reviewed: 02/08/2014 Elsevier Interactive Patient Education 2016 [employer]  

## 2015-08-06 ENCOUNTER — Telehealth: Payer: Self-pay | Admitting: Family Medicine

## 2015-08-06 DIAGNOSIS — M797 Fibromyalgia: Secondary | ICD-10-CM

## 2015-08-06 DIAGNOSIS — G43011 Migraine without aura, intractable, with status migrainosus: Secondary | ICD-10-CM

## 2015-08-06 MED ORDER — HYDROCODONE-ACETAMINOPHEN 10-325 MG PO TABS: 1.0000 | ORAL_TABLET | Freq: Four times a day (QID) | ORAL | Status: DC | PRN

## 2015-08-06 MED ORDER — TIZANIDINE HCL 4 MG PO TABS: 4.0000 mg | ORAL_TABLET | Freq: Four times a day (QID) | ORAL | Status: DC | PRN

## 2015-08-06 NOTE — Telephone Encounter (Signed)
VM left to advise Rx ready for pick up.      KP

## 2015-08-06 NOTE — Telephone Encounter (Signed)
Pt called for refill on zaniflex, has 3 left, takes prn for headaches. Also needs Norco, has 5 left, takes prn for headaches. Ph# [phone] to call when ready.

## 2015-08-06 NOTE — Telephone Encounter (Signed)
Refill both x1 

## 2015-08-06 NOTE — Telephone Encounter (Signed)
Last seen 07/08/15 and   Hydrocodone filled 05/27/15 #30 Zanaflex 07/05/15 #30  UDS /5/16 low risk   Please advise     KP

## 2015-08-10 ENCOUNTER — Telehealth: Payer: Self-pay | Admitting: Family Medicine

## 2015-08-10 NOTE — Telephone Encounter (Signed)
Message left--I need to know details on why the colonoscopy is being requesting

## 2015-08-10 NOTE — Telephone Encounter (Signed)
Caller name: Self  Can be reached: [phone]  Reason for call: Patient requesting to have a Colonoscopy.

## 2015-08-11 NOTE — Telephone Encounter (Signed)
She can try anusol otc If she develops any other symptoms she needs to be seen sooner or go to ER

## 2015-08-11 NOTE — Telephone Encounter (Signed)
Spoke with patient who has a History of anal fissures but over the past few days she has had rectal bleeding when making a bowel movement, the blood is bright red. She has an apt on 08/19/14, denied abdominal pain, no loss of appetite, no constipation and no diarrhea. She wanted to know if there anything she needs to do prior to her apt. (She requested the 27th)   Please advise    KP

## 2015-08-12 NOTE — Telephone Encounter (Signed)
Patient has been made aware but she said it has not gotten any worst. She will try OTC med's and keep her apt.     KP

## 2015-08-20 ENCOUNTER — Ambulatory Visit (INDEPENDENT_AMBULATORY_CARE_PROVIDER_SITE_OTHER): Payer: BLUE CROSS/BLUE SHIELD | Admitting: Family Medicine

## 2015-08-20 ENCOUNTER — Encounter: Payer: Self-pay | Admitting: Family Medicine

## 2015-08-20 VITALS — BP 120/90 | HR 93 | Temp 97.9°F | Ht 67.0 in | Wt 320.4 lb

## 2015-08-20 DIAGNOSIS — K625 Hemorrhage of anus and rectum: Secondary | ICD-10-CM | POA: Insufficient documentation

## 2015-08-20 LAB — CBC WITH DIFFERENTIAL/PLATELET
BASOS ABS: 0 10*3/uL (ref 0.0–0.1)
Basophils Relative: 0 % (ref 0–1)
EOS ABS: 0.2 10*3/uL (ref 0.0–0.7)
Eosinophils Relative: 2 % (ref 0–5)
HEMATOCRIT: 40.5 % (ref 36.0–46.0)
Hemoglobin: 13.7 g/dL (ref 12.0–15.0)
LYMPHS ABS: 1.8 10*3/uL (ref 0.7–4.0)
LYMPHS PCT: 24 % (ref 12–46)
MCH: 28.5 pg (ref 26.0–34.0)
MCHC: 33.8 g/dL (ref 30.0–36.0)
MCV: 84.2 fL (ref 78.0–100.0)
MPV: 11.5 fL (ref 8.6–12.4)
Monocytes Absolute: 0.5 10*3/uL (ref 0.1–1.0)
Monocytes Relative: 7 % (ref 3–12)
NEUTROS PCT: 67 % (ref 43–77)
Neutro Abs: 5.1 10*3/uL (ref 1.7–7.7)
PLATELETS: 279 10*3/uL (ref 150–400)
RBC: 4.81 MIL/uL (ref 3.87–5.11)
RDW: 12.6 % (ref 11.5–15.5)
WBC: 7.6 10*3/uL (ref 4.0–10.5)

## 2015-08-20 NOTE — Assessment & Plan Note (Signed)
con't omeprazole Refer to GI Check [facility]

## 2015-08-20 NOTE — Patient Instructions (Signed)

## 2015-08-20 NOTE — Progress Notes (Signed)
Pre visit review using our clinic review tool, if applicable. No additional management support is needed unless otherwise documented below in the visit note. 

## 2015-08-20 NOTE — Progress Notes (Signed)
Patient ID: Jo Jackson, female    DOB: April 06, 1971  Age: 45 y.o. MRN: [id number]    Subjective:  Subjective HPI Jo Jackson presents for c/o rectal bleed.  Blood was in the toilet and TP.  No abd pain.  First noticed it 2 weeks ago.    Review of Systems  Constitutional: Negative for fever, chills, diaphoresis, activity change, appetite change, fatigue and unexpected weight change.  Eyes: Negative for pain, redness and visual disturbance.  Respiratory: Negative for cough, chest tightness, shortness of breath and wheezing.   Cardiovascular: Negative for chest pain, palpitations and leg swelling.  Gastrointestinal: Positive for blood in stool. Negative for nausea, vomiting, abdominal pain, diarrhea, constipation, abdominal distention and rectal pain.  Endocrine: Negative for cold intolerance, heat intolerance, polydipsia, polyphagia and polyuria.  Genitourinary: Negative for dysuria, frequency, hematuria, flank pain, vaginal discharge, difficulty urinating, genital sores, vaginal pain, menstrual problem, pelvic pain and dyspareunia.  Musculoskeletal: Negative for back pain.  Neurological: Negative for dizziness, light-headedness, numbness and headaches.    History Past Medical History  Diagnosis Date  . ADD (attention deficit disorder)   . Junctional escape rhythm   . Supraventricular tachycardia (HCC)   . Headache(784.0)     Migraines  . Anxiety   . Depression   . Pregnancy induced hypertension   . Abnormal Pap smear     Repeats WNL  . Arthritis     Oseteoarthritis  . GERD (gastroesophageal reflux disease)   . Neuromuscular disorder (HCC)     Fibromyalgia    She has past surgical history that includes Appendectomy (1986); Carpal tunnel release (2000); Cervical fusion (08-24-99,04-26-00,05-09-01); Shoulder surgery (left-6-04, right 09-12-04); tongue polyp (1984); Ablation of dysrhythmic focus; and Knee sugery (Left).   Her family history includes COPD in her mother;  Cancer in her paternal aunt; Heart disease in her mother; Hodgkin's lymphoma in her father.She reports that she quit smoking about 7 years ago. She has never used smokeless tobacco. She reports that she drinks alcohol. She reports that she does not use illicit drugs.  Current Outpatient Prescriptions on File Prior to Visit  Medication Sig Dispense Refill  . albuterol (PROAIR HFA) 108 (90 BASE) MCG/ACT inhaler Inhale 2 puffs into the lungs every 6 (six) hours as needed for wheezing or shortness of breath. 1 Inhaler 2  . amphetamine-dextroamphetamine (ADDERALL) 30 MG tablet Take 1 tablet by mouth 2 (two) times daily. 180 tablet 0  . DULoxetine (CYMBALTA) 60 MG capsule Take 1 capsule (60 mg total) by mouth daily. 90 capsule 3  . HYDROcodone-acetaminophen (NORCO) 10-325 MG tablet Take 1 tablet by mouth every 6 (six) hours as needed. 30 tablet 0  . meloxicam (MOBIC) 15 MG tablet Take 1 tablet (15 mg total) by mouth daily. 90 tablet 0  . omeprazole (PRILOSEC) 40 MG capsule Take 1 capsule (40 mg total) by mouth 2 (two) times daily before a meal. 180 capsule 3  . SUMAtriptan (IMITREX) 50 MG tablet Take 1 tablet (50 mg total) by mouth once. May repeat in 2 hours if headache persists or recurs. 10 tablet 3  . tiZANidine (ZANAFLEX) 4 MG tablet Take 1 tablet (4 mg total) by mouth every 6 (six) hours as needed for muscle spasms. 30 tablet 0  . traMADol (ULTRAM) 50 MG tablet Take 1 tablet (50 mg total) by mouth every 8 (eight) hours as needed. 30 tablet 0  . triamcinolone (NASACORT AQ) 55 MCG/ACT AERO nasal inhaler Place 2 sprays into the nose daily. 1 Inhaler 12  .  valACYclovir (VALTREX) 1000 MG tablet Take 1 tablet (1,000 mg total) by mouth 3 (three) times daily. 30 tablet 5   No current facility-administered medications on file prior to visit.     Objective:  Objective Physical Exam  Constitutional: She is oriented to person, place, and time. She appears well-developed and well-nourished.  HENT:  Head:  Normocephalic and atraumatic.  Eyes: Conjunctivae and EOM are normal.  Neck: Normal range of motion. Neck supple. No JVD present. Carotid bruit is not present. No thyromegaly present.  Cardiovascular: Normal rate, regular rhythm and normal heart sounds.   No murmur heard. Pulmonary/Chest: Effort normal and breath sounds normal. No respiratory distress. She has no wheezes. She has no rales. She exhibits no tenderness.  Abdominal: Soft. She exhibits no distension. There is no tenderness. There is no rebound and no guarding.  Genitourinary: Guaiac positive stool. No vaginal discharge found.  Musculoskeletal: She exhibits no edema.  Neurological: She is alert and oriented to person, place, and time.  Psychiatric: She has a normal mood and affect.  Nursing note and vitals reviewed.  BP 120/90 mmHg  Pulse 93  Temp(Src) 97.9 F (36.6 C) (Oral)  Ht  (1.702 m)  Wt 320 lb 6.4 oz (145.332 kg)  BMI 50.17 kg/m2  SpO2 97% Wt Readings from Last 3 Encounters:  08/20/15 320 lb 6.4 oz (145.332 kg)  07/08/15 313 lb (141.976 kg)  06/04/15 305 lb (138.347 kg)     Lab Results  Component Value Date   WBC 4.2 09/26/2013   HGB 13.5 09/26/2013   HCT 39.3 09/26/2013   PLT 229 09/26/2013   GLUCOSE 85 09/26/2013   CHOL 203* 04/10/2008   TRIG 56 04/10/2008   HDL 82 04/10/2008   LDLCALC 110* 04/10/2008   ALT 25 09/26/2013   AST 20 09/26/2013   NA 139 09/26/2013   K 4.1 09/26/2013   CL 102 09/26/2013   CREATININE 0.64 09/26/2013   BUN 10 09/26/2013   CO2 28 09/26/2013   TSH 2.456 06/06/2013   HGBA1C 5.4 06/06/2013    No results found.   Assessment & Plan:  Plan I am having Jo Jackson maintain her SUMAtriptan, triamcinolone, valACYclovir, omeprazole, DULoxetine, traMADol, meloxicam, albuterol, amphetamine-dextroamphetamine, HYDROcodone-acetaminophen, and tiZANidine.  No orders of the defined types were placed in this encounter.    Problem List Items Addressed This Visit       Unprioritized   Rectal bleeding - Primary    con't omeprazole Refer to GI Check cbcd      Relevant Orders   Ambulatory referral to Gastroenterology      Follow-up: Return if symptoms worsen or fail to improve.  Loreen Freud, DO

## 2015-08-31 ENCOUNTER — Encounter: Payer: Self-pay | Admitting: Family Medicine

## 2015-08-31 ENCOUNTER — Other Ambulatory Visit: Payer: Self-pay | Admitting: Family Medicine

## 2015-08-31 DIAGNOSIS — M25562 Pain in left knee: Secondary | ICD-10-CM

## 2015-08-31 DIAGNOSIS — G43809 Other migraine, not intractable, without status migrainosus: Secondary | ICD-10-CM

## 2015-09-01 ENCOUNTER — Other Ambulatory Visit: Payer: Self-pay | Admitting: Family Medicine

## 2015-09-01 MED ORDER — TRAMADOL HCL 50 MG PO TABS: 50.0000 mg | ORAL_TABLET | Freq: Three times a day (TID) | ORAL | Status: DC | PRN

## 2015-09-01 MED ORDER — MELOXICAM 15 MG PO TABS
15.0000 mg | ORAL_TABLET | Freq: Every day | ORAL | Status: DC
Start: 2015-09-01 — End: 2015-11-26

## 2015-09-01 NOTE — Telephone Encounter (Signed)
Last seen 08/20/15 and filled 05/27/15.   Please advise     KP

## 2015-09-01 NOTE — Addendum Note (Signed)
Addended by: Arnette Norris on: 09/01/2015 10:18 AM   Modules accepted: Orders

## 2015-09-02 MED ORDER — ONDANSETRON HCL 8 MG PO TABS
8.0000 mg | ORAL_TABLET | Freq: Three times a day (TID) | ORAL | Status: DC | PRN
Start: 2015-09-02 — End: 2016-09-05

## 2015-09-02 NOTE — Telephone Encounter (Signed)
If they still need it-- zofran 8 mg for pt 1 po tid prn #10

## 2015-09-14 ENCOUNTER — Encounter: Payer: Self-pay | Admitting: Family Medicine

## 2015-09-14 MED ORDER — LORCASERIN HCL 10 MG PO TABS: ORAL_TABLET | ORAL | Status: DC

## 2015-09-14 NOTE — Telephone Encounter (Signed)
Please advise      KP 

## 2015-09-14 NOTE — Telephone Encounter (Signed)
printed

## 2015-09-21 ENCOUNTER — Encounter: Payer: Self-pay | Admitting: Internal Medicine

## 2015-10-20 ENCOUNTER — Other Ambulatory Visit: Payer: Self-pay | Admitting: Family Medicine

## 2015-10-20 DIAGNOSIS — G43011 Migraine without aura, intractable, with status migrainosus: Secondary | ICD-10-CM

## 2015-10-20 DIAGNOSIS — F988 Other specified behavioral and emotional disorders with onset usually occurring in childhood and adolescence: Secondary | ICD-10-CM

## 2015-10-20 DIAGNOSIS — M797 Fibromyalgia: Secondary | ICD-10-CM

## 2015-10-21 ENCOUNTER — Ambulatory Visit: Payer: BLUE CROSS/BLUE SHIELD | Admitting: Family Medicine

## 2015-10-21 MED ORDER — AMPHETAMINE-DEXTROAMPHETAMINE 30 MG PO TABS: 30.0000 mg | ORAL_TABLET | Freq: Two times a day (BID) | ORAL | Status: DC

## 2015-10-21 MED ORDER — TIZANIDINE HCL 4 MG PO TABS: 4.0000 mg | ORAL_TABLET | Freq: Four times a day (QID) | ORAL | Status: DC | PRN

## 2015-10-21 MED ORDER — HYDROCODONE-ACETAMINOPHEN 10-325 MG PO TABS: 1.0000 | ORAL_TABLET | Freq: Four times a day (QID) | ORAL | Status: DC | PRN

## 2015-10-21 NOTE — Telephone Encounter (Signed)
Last seen 08/20/15 and filled   Adderall 07/05/15 #180 Hydrocodone 08/06/15 #30 Zanaflex 08/06/15 #30  UDS 08/28/14.  Please advise    KP

## 2015-11-25 ENCOUNTER — Encounter: Payer: Self-pay | Admitting: Family Medicine

## 2015-11-26 ENCOUNTER — Encounter: Payer: Self-pay | Admitting: Family Medicine

## 2015-11-26 ENCOUNTER — Ambulatory Visit (INDEPENDENT_AMBULATORY_CARE_PROVIDER_SITE_OTHER): Payer: BLUE CROSS/BLUE SHIELD | Admitting: Family Medicine

## 2015-11-26 VITALS — BP 134/86 | HR 85 | Temp 97.9°F | Wt 297.0 lb

## 2015-11-26 DIAGNOSIS — F988 Other specified behavioral and emotional disorders with onset usually occurring in childhood and adolescence: Secondary | ICD-10-CM

## 2015-11-26 DIAGNOSIS — M25562 Pain in left knee: Secondary | ICD-10-CM

## 2015-11-26 DIAGNOSIS — J208 Acute bronchitis due to other specified organisms: Secondary | ICD-10-CM | POA: Diagnosis not present

## 2015-11-26 DIAGNOSIS — G43009 Migraine without aura, not intractable, without status migrainosus: Secondary | ICD-10-CM | POA: Diagnosis not present

## 2015-11-26 DIAGNOSIS — F909 Attention-deficit hyperactivity disorder, unspecified type: Secondary | ICD-10-CM | POA: Diagnosis not present

## 2015-11-26 MED ORDER — ALBUTEROL SULFATE HFA 108 (90 BASE) MCG/ACT IN AERS: 2.0000 | INHALATION_SPRAY | Freq: Four times a day (QID) | RESPIRATORY_TRACT | Status: DC | PRN

## 2015-11-26 MED ORDER — MELOXICAM 15 MG PO TABS: 15.0000 mg | ORAL_TABLET | Freq: Every day | ORAL | Status: DC

## 2015-11-26 NOTE — Assessment & Plan Note (Signed)
con't meds-- adderall rto 6 months Symptoms controlled

## 2015-11-26 NOTE — Assessment & Plan Note (Signed)
con't norco prn

## 2015-11-26 NOTE — Patient Instructions (Signed)
Attention Deficit Hyperactivity Disorder  Attention deficit hyperactivity disorder (ADHD) is a problem with behavior issues based on the way the brain functions (neurobehavioral disorder). It is a common reason for behavior and academic problems in school.  SYMPTOMS   There are 3 types of ADHD. The 3 types and some of the symptoms include:  · Inattentive.    Gets bored or distracted easily.    Loses or forgets things. Forgets to hand in homework.    Has trouble organizing or completing tasks.    Difficulty staying on task.    An inability to organize daily tasks and school work.    Leaving projects, chores, or homework unfinished.    Trouble paying attention or responding to details. Careless mistakes.    Difficulty following directions. Often seems like is not listening.    Dislikes activities that require sustained attention (like chores or homework).  · Hyperactive-impulsive.    Feels like it is impossible to sit still or stay in a seat. Fidgeting with hands and feet.    Trouble waiting turn.    Talking too much or out of turn. Interruptive.    Speaks or acts impulsively.    Aggressive, disruptive behavior.    Constantly busy or on the go; noisy.    Often leaves seat when they are expected to remain seated.    Often runs or climbs where it is not appropriate, or feels very restless.  · Combined.    Has symptoms of both of the above.  Often children with ADHD feel discouraged about themselves and with school. They often perform well below their abilities in school.  As children get older, the excess motor activities can calm down, but the problems with paying attention and staying organized persist. Most children do not outgrow ADHD but with good treatment can learn to cope with the symptoms.  DIAGNOSIS   When ADHD is suspected, the diagnosis should be made by professionals trained in ADHD. This professional will collect information about the individual suspected of having ADHD. Information must be collected from  various settings where the person lives, works, or attends school.    Diagnosis will include:  · Confirming symptoms began in childhood.  · Ruling out other reasons for the child's behavior.  · The health care providers will check with the child's school and check their medical records.  · They will talk to teachers and parents.  · Behavior rating scales for the child will be filled out by those dealing with the child on a daily basis.  A diagnosis is made only after all information has been considered.  TREATMENT   Treatment usually includes behavioral treatment, tutoring or extra support in school, and stimulant medicines. Because of the way a person's brain works with ADHD, these medicines decrease impulsivity and hyperactivity and increase attention. This is different than how they would work in a person who does not have ADHD. Other medicines used include antidepressants and certain blood pressure medicines.  Most experts agree that treatment for ADHD should address all aspects of the person's functioning. Along with medicines, treatment should include structured classroom management at school. Parents should reward good behavior, provide constant discipline, and set limits. Tutoring should be available for the child as needed.  ADHD is a lifelong condition. If untreated, the disorder can have long-term serious effects into adolescence and adulthood.  HOME CARE INSTRUCTIONS   · Often with ADHD there is a lot of frustration among family members dealing with the condition. Blame   and anger are also feelings that are common. In many cases, because the problem affects the family as a whole, the entire family may need help. A therapist can help the family find better ways to handle the disruptive behaviors of the person with ADHD and promote change. If the person with ADHD is young, most of the therapist's work is with the parents. Parents will learn techniques for coping with and improving their child's behavior.  Sometimes only the child with the ADHD needs counseling. Your health care providers can help you make these decisions.  · Children with ADHD may need help learning how to organize. Some helpful tips include:  ¨ Keep routines the same every day from wake-up time to bedtime. Schedule all activities, including homework and playtime. Keep the schedule in a place where the person with ADHD will often see it. Mark schedule changes as far in advance as possible.  ¨ Schedule outdoor and indoor recreation.  ¨ Have a place for everything and keep everything in its place. This includes clothing, backpacks, and school supplies.  ¨ Encourage writing down assignments and bringing home needed books. Work with your child's teachers for assistance in organizing school work.  · Offer your child a well-balanced diet. Breakfast that includes a balance of whole grains, protein, and fruits or vegetables is especially important for school performance. Children should avoid drinks with caffeine including:  ¨ Soft drinks.  ¨ Coffee.  ¨ Tea.  ¨ However, some older children (adolescents) may find these drinks helpful in improving their attention. Because it can also be common for adolescents with ADHD to become addicted to caffeine, talk with your health care provider about what is a safe amount of caffeine intake for your child.  · Children with ADHD need consistent rules that they can understand and follow. If rules are followed, give small rewards. Children with ADHD often receive, and expect, criticism. Look for good behavior and praise it. Set realistic goals. Give clear instructions. Look for activities that can foster success and self-esteem. Make time for pleasant activities with your child. Give lots of affection.  · Parents are their children's greatest advocates. Learn as much as possible about ADHD. This helps you become a stronger and better advocate for your child. It also helps you educate your child's teachers and instructors  if they feel inadequate in these areas. Parent support groups are often helpful. A national group with local chapters is called Children and Adults with Attention Deficit Hyperactivity Disorder (CHADD).  SEEK MEDICAL CARE IF:  · Your child has repeated muscle twitches, cough, or speech outbursts.  · Your child has sleep problems.  · Your child has a marked loss of appetite.  · Your child develops depression.  · Your child has new or worsening behavioral problems.  · Your child develops dizziness.  · Your child has a racing heart.  · Your child has stomach pains.  · Your child develops headaches.  SEEK IMMEDIATE MEDICAL CARE IF:  · Your child has been diagnosed with depression or anxiety and the symptoms seem to be getting worse.  · Your child has been depressed and suddenly appears to have increased energy or motivation.  · You are worried that your child is having a bad reaction to a medication he or she is taking for ADHD.     This information is not intended to replace advice given to you by your health care provider. Make sure you discuss any questions you have with your   health care provider.     Document Released: 06/30/2002 Document Revised: 07/15/2013 Document Reviewed: 03/17/2013  Elsevier Interactive Patient Education ©2016 [employer]

## 2015-11-26 NOTE — Progress Notes (Signed)
Pre visit review using our clinic review tool, if applicable. No additional management support is needed unless otherwise documented below in the visit note. 

## 2015-11-26 NOTE — Progress Notes (Signed)
Patient ID: Jo Jackson, female    DOB: 03/06/1971  Age: 45 y.o. MRN: [id number]    Subjective:  Subjective HPI Jo Jackson presents for f/u add and knee pain.  She is seeing [facility] for knee and waiting for mri to be scheduled.   No other complaints.     Review of Systems  Constitutional: Negative for diaphoresis, appetite change, fatigue and unexpected weight change.  Eyes: Negative for pain, redness and visual disturbance.  Respiratory: Negative for cough, chest tightness, shortness of breath and wheezing.   Cardiovascular: Negative for chest pain, palpitations and leg swelling.  Endocrine: Negative for cold intolerance, heat intolerance, polydipsia, polyphagia and polyuria.  Genitourinary: Negative for dysuria, frequency and difficulty urinating.  Neurological: Negative for dizziness, light-headedness, numbness and headaches.    History Past Medical History  Diagnosis Date  . ADD (attention deficit disorder)   . Junctional escape rhythm   . Supraventricular tachycardia (HCC)   . Headache(784.0)     Migraines  . Anxiety   . Depression   . Pregnancy induced hypertension   . Abnormal Pap smear     Repeats WNL  . Arthritis     Oseteoarthritis  . GERD (gastroesophageal reflux disease)   . Neuromuscular disorder (HCC)     Fibromyalgia    She has past surgical history that includes Appendectomy (1986); Carpal tunnel release (2000); Cervical fusion (08-24-99,04-26-00,05-09-01); Shoulder surgery (left-6-04, right 09-12-04); tongue polyp (1984); Ablation of dysrhythmic focus; and Knee sugery (Left).   Her family history includes COPD in her mother; Cancer in her paternal aunt; Heart disease in her mother; Hodgkin's lymphoma in her father.She reports that she quit smoking about 8 years ago. She has never used smokeless tobacco. She reports that she drinks alcohol. She reports that she does not use illicit drugs.  Current Outpatient Prescriptions on File Prior to  Visit  Medication Sig Dispense Refill  . amphetamine-dextroamphetamine (ADDERALL) 30 MG tablet Take 1 tablet by mouth 2 (two) times daily. 180 tablet 0  . HYDROcodone-acetaminophen (NORCO) 10-325 MG tablet Take 1 tablet by mouth every 6 (six) hours as needed. 30 tablet 0  . omeprazole (PRILOSEC) 40 MG capsule Take 1 capsule (40 mg total) by mouth 2 (two) times daily before a meal. 180 capsule 3  . ondansetron (ZOFRAN) 8 MG tablet Take 1 tablet (8 mg total) by mouth 3 (three) times daily as needed for nausea or vomiting. 10 tablet 0  . SUMAtriptan (IMITREX) 50 MG tablet TAKE 1 TABLET BY MOUTH ONCE, MAY REPEAT IN 2 HOURS IF HEADACHE PERSISTS OR RECURS 10 tablet 2  . tiZANidine (ZANAFLEX) 4 MG tablet Take 1 tablet (4 mg total) by mouth every 6 (six) hours as needed for muscle spasms. 30 tablet 0  . traMADol (ULTRAM) 50 MG tablet Take 1 tablet (50 mg total) by mouth every 8 (eight) hours as needed. 30 tablet 0  . valACYclovir (VALTREX) 1000 MG tablet TAKE 1 TABLET (1,000 MG TOTAL) BY MOUTH 3 (THREE) TIMES DAILY. 30 tablet 5   No current facility-administered medications on file prior to visit.     Objective:  Objective Physical Exam  Constitutional: She is oriented to person, place, and time. She appears well-developed and well-nourished.  HENT:  Head: Normocephalic and atraumatic.  Eyes: Conjunctivae and EOM are normal.  Neck: Normal range of motion. Neck supple. No JVD present. Carotid bruit is not present. No thyromegaly present.  Cardiovascular: Normal rate, regular rhythm and normal heart sounds.   No murmur heard.  Pulmonary/Chest: Effort normal and breath sounds normal. No respiratory distress. She has no wheezes. She has no rales. She exhibits no tenderness.  Musculoskeletal: She exhibits no edema.  Neurological: She is alert and oriented to person, place, and time.  Psychiatric: She has a normal mood and affect. Her behavior is normal. Judgment and thought content normal.  Nursing  note and vitals reviewed.  BP 134/86 mmHg  Pulse 85  Temp(Src) 97.9 F (36.6 C) (Oral)  Wt 297 lb (134.718 kg)  SpO2 95% Wt Readings from Last 3 Encounters:  11/26/15 297 lb (134.718 kg)  08/20/15 320 lb 6.4 oz (145.332 kg)  07/08/15 313 lb (141.976 kg)     Lab Results  Component Value Date   WBC 7.6 08/20/2015   HGB 13.7 08/20/2015   HCT 40.5 08/20/2015   PLT 279 08/20/2015   GLUCOSE 85 09/26/2013   CHOL 203* 04/10/2008   TRIG 56 04/10/2008   HDL 82 04/10/2008   LDLCALC 110* 04/10/2008   ALT 25 09/26/2013   AST 20 09/26/2013   NA 139 09/26/2013   K 4.1 09/26/2013   CL 102 09/26/2013   CREATININE 0.64 09/26/2013   BUN 10 09/26/2013   CO2 28 09/26/2013   TSH 2.456 06/06/2013   HGBA1C 5.4 06/06/2013    No results found.   Assessment & Plan:  Plan I have discontinued Jo Jackson's triamcinolone, DULoxetine, and Lorcaserin HCl. I am also having her maintain her omeprazole, traMADol, valACYclovir, SUMAtriptan, ondansetron, amphetamine-dextroamphetamine, HYDROcodone-acetaminophen, tiZANidine, meloxicam, and albuterol.  Meds ordered this encounter  Medications  . meloxicam (MOBIC) 15 MG tablet    Sig: Take 1 tablet (15 mg total) by mouth daily.    Dispense:  90 tablet    Refill:  0  . albuterol (PROAIR HFA) 108 (90 Base) MCG/ACT inhaler    Sig: Inhale 2 puffs into the lungs every 6 (six) hours as needed for wheezing or shortness of breath.    Dispense:  1 Inhaler    Refill:  2    Problem List Items Addressed This Visit      Unprioritized   Attention deficit disorder - Primary    con't meds-- adderall rto 6 months Symptoms controlled      Migraine without aura    con't norco prn      Relevant Medications   meloxicam (MOBIC) 15 MG tablet   Acute bronchitis   Relevant Medications   albuterol (PROAIR HFA) 108 (90 Base) MCG/ACT inhaler    Other Visit Diagnoses    Knee pain, acute, left        Relevant Medications    meloxicam (MOBIC) 15 MG tablet         Follow-up: Return in about 6 months (around 05/28/2016), or if symptoms worsen or fail to improve, for ADD.  [name], DO

## 2015-11-28 ENCOUNTER — Other Ambulatory Visit: Payer: Self-pay | Admitting: Family Medicine

## 2015-11-29 NOTE — Telephone Encounter (Signed)
Duplicate request. Rx faxed on 11/26/15.    KP

## 2015-12-22 ENCOUNTER — Other Ambulatory Visit: Payer: Self-pay | Admitting: Family Medicine

## 2015-12-22 DIAGNOSIS — M797 Fibromyalgia: Secondary | ICD-10-CM

## 2015-12-22 DIAGNOSIS — M25562 Pain in left knee: Secondary | ICD-10-CM

## 2015-12-22 DIAGNOSIS — G43809 Other migraine, not intractable, without status migrainosus: Secondary | ICD-10-CM

## 2015-12-22 DIAGNOSIS — R109 Unspecified abdominal pain: Secondary | ICD-10-CM

## 2015-12-22 DIAGNOSIS — G43011 Migraine without aura, intractable, with status migrainosus: Secondary | ICD-10-CM

## 2015-12-22 NOTE — Telephone Encounter (Signed)
Requesting Tizanidine 4mg -Take 1 tablet by mouth every 6 hours as needed for muscle spasms. Last refill:10/21/15;#30,0 Last OV:11/26/15 Please advise.//AB/CMA

## 2015-12-23 MED ORDER — TRAMADOL HCL 50 MG PO TABS: 50.0000 mg | ORAL_TABLET | Freq: Three times a day (TID) | ORAL | Status: DC | PRN

## 2015-12-23 MED ORDER — MELOXICAM 15 MG PO TABS: 15.0000 mg | ORAL_TABLET | Freq: Every day | ORAL | Status: DC

## 2015-12-23 MED ORDER — HYDROCODONE-ACETAMINOPHEN 10-325 MG PO TABS: 1.0000 | ORAL_TABLET | Freq: Four times a day (QID) | ORAL | Status: DC | PRN

## 2015-12-23 NOTE — Telephone Encounter (Signed)
Last seen 11/26/15 and filled  Hydrocodone 10/21/15 #30 Tramadol 09/01/15 #30 Meloxicam 11/26/15 #90   UDS 08/28/14 low risk    Please advise    KP

## 2015-12-24 NOTE — Telephone Encounter (Signed)
Rx approved and sent to the pharmacy by e-script.//AB/CMA 

## 2015-12-27 MED ORDER — OMEPRAZOLE 40 MG PO CPDR: 40.0000 mg | DELAYED_RELEASE_CAPSULE | Freq: Two times a day (BID) | ORAL | Status: DC

## 2015-12-27 NOTE — Addendum Note (Signed)
Addended by: Arnette NorrisPAYNE, Raziyah Vanvleck P on: 12/27/2015 08:29 AM   Modules accepted: Orders

## 2016-01-20 ENCOUNTER — Encounter: Payer: Self-pay | Admitting: Family Medicine

## 2016-01-27 ENCOUNTER — Telehealth: Payer: Self-pay | Admitting: Family Medicine

## 2016-01-27 NOTE — Telephone Encounter (Signed)
Please advise 

## 2016-01-27 NOTE — Telephone Encounter (Signed)
Pt called in because she says that she is taking Meloxicam. Pt say that she is thinking to start back taking Cymbalta. Pt would like to be advised on if it is okay for her to take both? Pt says that she read side effects online and want to be sure that it's safe.    Please advise.   CB: [phone]

## 2016-01-27 NOTE — Telephone Encounter (Signed)
OK to take both, would have her take Meloxicam in am and Cymbalta in pm. Slight increased bleeding risk if both taken together but not a severe risk.

## 2016-01-28 NOTE — Telephone Encounter (Signed)
Detailed message left with the below recommendations and advising the patient to call back with any questions or concerns.    KP

## 2016-02-11 ENCOUNTER — Telehealth: Payer: Self-pay | Admitting: Family Medicine

## 2016-02-11 ENCOUNTER — Encounter: Payer: Self-pay | Admitting: Family Medicine

## 2016-02-11 DIAGNOSIS — F988 Other specified behavioral and emotional disorders with onset usually occurring in childhood and adolescence: Secondary | ICD-10-CM

## 2016-02-11 MED ORDER — AMPHETAMINE-DEXTROAMPHETAMINE 30 MG PO TABS: 30.0000 mg | ORAL_TABLET | Freq: Two times a day (BID) | ORAL | Status: DC

## 2016-02-11 NOTE — Telephone Encounter (Signed)
Pt is requesting refill on Adderall.  Last OV: 11/26/2015 Last Fill: 10/21/2015 #180 and 0RF UDS: 11/19/2015 Low risk  Please advise.

## 2016-02-11 NOTE — Telephone Encounter (Signed)
She needs ov-- then we can give her a note while she is here

## 2016-02-11 NOTE — Telephone Encounter (Signed)
Pt informed via MyChart that Rx has been placed at front desk for pick up.  

## 2016-02-11 NOTE — Telephone Encounter (Signed)
Rx printed, awaiting DO signature.  

## 2016-02-11 NOTE — Telephone Encounter (Signed)
Please call Pt to schedule. Thank you.

## 2016-02-11 NOTE — Telephone Encounter (Signed)
Refill 3 months

## 2016-03-02 ENCOUNTER — Encounter: Payer: Self-pay | Admitting: Family Medicine

## 2016-03-02 ENCOUNTER — Ambulatory Visit (INDEPENDENT_AMBULATORY_CARE_PROVIDER_SITE_OTHER): Payer: BLUE CROSS/BLUE SHIELD | Admitting: Family Medicine

## 2016-03-02 VITALS — BP 130/86 | Temp 98.2°F | Ht 67.0 in | Wt 283.0 lb

## 2016-03-02 DIAGNOSIS — M797 Fibromyalgia: Secondary | ICD-10-CM

## 2016-03-02 DIAGNOSIS — G894 Chronic pain syndrome: Secondary | ICD-10-CM | POA: Diagnosis not present

## 2016-03-02 MED ORDER — HYDROCODONE-ACETAMINOPHEN 10-325 MG PO TABS: 1.0000 | ORAL_TABLET | Freq: Four times a day (QID) | ORAL | 0 refills | Status: DC | PRN

## 2016-03-02 NOTE — Progress Notes (Signed)
Patient ID: Jo Jackson, female    DOB: 1971-03-03  Age: 45 y.o. MRN: [id number]    Subjective:  Subjective  HPI Jo Jackson presents for f/u adderall --  She does not take it everyday---only when she needs it.  Pt using norco sparingly.  She has not been to pain management in many years--- she is willing to try again to see if there is anything new.    Review of Systems  Constitutional: Negative for appetite change, diaphoresis, fatigue and unexpected weight change.  Eyes: Negative for pain, redness and visual disturbance.  Respiratory: Negative for cough, chest tightness, shortness of breath and wheezing.   Cardiovascular: Negative for chest pain, palpitations and leg swelling.  Endocrine: Negative for cold intolerance, heat intolerance, polydipsia, polyphagia and polyuria.  Genitourinary: Negative for difficulty urinating, dysuria and frequency.  Musculoskeletal: Positive for neck pain.  Neurological: Positive for headaches. Negative for dizziness, light-headedness and numbness.    History Past Medical History:  Diagnosis Date  . Abnormal Pap smear    Repeats WNL  . ADD (attention deficit disorder)   . Anxiety   . Arthritis    Oseteoarthritis  . Depression   . GERD (gastroesophageal reflux disease)   . Headache(784.0)    Migraines  . Junctional escape rhythm   . Neuromuscular disorder (HCC)    Fibromyalgia  . Pregnancy induced hypertension   . Supraventricular tachycardia (HCC)     She has a past surgical history that includes Appendectomy (1986); Carpal tunnel release (2000); Cervical fusion (08-24-99,04-26-00,05-09-01); Shoulder surgery (left-6-04, right 09-12-04); tongue polyp (1984); Ablation of dysrhythmic focus; and Knee sugery (Left).   Her family history includes COPD in her mother; Cancer in her paternal aunt; Heart disease in her mother; Hodgkin's lymphoma in her father.She reports that she quit smoking about 8 years ago. She has never used smokeless  tobacco. She reports that she drinks alcohol. She reports that she does not use drugs.  Current Outpatient Prescriptions on File Prior to Visit  Medication Sig Dispense Refill  . albuterol (PROAIR HFA) 108 (90 Base) MCG/ACT inhaler Inhale 2 puffs into the lungs every 6 (six) hours as needed for wheezing or shortness of breath. 1 Inhaler 2  . amphetamine-dextroamphetamine (ADDERALL) 30 MG tablet Take 1 tablet by mouth 2 (two) times daily. 180 tablet 0  . meloxicam (MOBIC) 15 MG tablet Take 1 tablet (15 mg total) by mouth daily. 90 tablet 0  . omeprazole (PRILOSEC) 40 MG capsule Take 1 capsule (40 mg total) by mouth 2 (two) times daily before a meal. 180 capsule 3  . ondansetron (ZOFRAN) 8 MG tablet Take 1 tablet (8 mg total) by mouth 3 (three) times daily as needed for nausea or vomiting. 10 tablet 0  . SUMAtriptan (IMITREX) 50 MG tablet TAKE 1 TABLET BY MOUTH ONCE, MAY REPEAT IN 2 HOURS IF HEADACHE PERSISTS OR RECURS 10 tablet 2  . tiZANidine (ZANAFLEX) 4 MG tablet Take 1 tablet (4 mg total) by mouth every 6 (six) hours as needed for muscle spasms. 30 tablet 0  . tiZANidine (ZANAFLEX) 4 MG tablet TAKE 1 TABLET (4 MG TOTAL) BY MOUTH EVERY 6 (SIX) HOURS AS NEEDED FOR MUSCLE SPASMS. 30 tablet 0  . traMADol (ULTRAM) 50 MG tablet Take 1 tablet (50 mg total) by mouth every 8 (eight) hours as needed. 30 tablet 0  . valACYclovir (VALTREX) 1000 MG tablet TAKE 1 TABLET (1,000 MG TOTAL) BY MOUTH 3 (THREE) TIMES DAILY. 30 tablet 5   No current facility-administered  medications on file prior to visit.      Objective:  Objective  Physical Exam  Constitutional: She is oriented to person, place, and time. She appears well-developed and well-nourished.  HENT:  Head: Normocephalic and atraumatic.  Eyes: Conjunctivae and EOM are normal.  Neck: Normal range of motion. Neck supple. No JVD present. Carotid bruit is not present. No thyromegaly present.  Cardiovascular: Normal rate, regular rhythm and normal heart  sounds.   No murmur heard. Pulmonary/Chest: Effort normal and breath sounds normal. No respiratory distress. She has no wheezes. She has no rales. She exhibits no tenderness.  Musculoskeletal: She exhibits tenderness. She exhibits no edema.       Right shoulder: She exhibits normal range of motion and no tenderness.       Left shoulder: She exhibits decreased range of motion, tenderness, pain and spasm.  Neurological: She is alert and oriented to person, place, and time.  Psychiatric: She has a normal mood and affect. Her behavior is normal. Thought content normal.  Nursing note and vitals reviewed.  BP 130/86 (BP Location: Left Arm, Patient Position: Sitting, Cuff Size: Normal)   Temp 98.2 F (36.8 C) (Oral)   Ht 5\' 7"  (1.702 m)   Wt 283 lb (128.4 kg)   BMI 44.32 kg/m  Wt Readings from Last 3 Encounters:  03/02/16 283 lb (128.4 kg)  11/26/15 297 lb (134.7 kg)  08/20/15 (!) 320 lb 6.4 oz (145.3 kg)     Lab Results  Component Value Date   WBC 7.6 08/20/2015   HGB 13.7 08/20/2015   HCT 40.5 08/20/2015   PLT 279 08/20/2015   GLUCOSE 85 09/26/2013   CHOL 203 (H) 04/10/2008   TRIG 56 04/10/2008   HDL 82 04/10/2008   LDLCALC 110 (H) 04/10/2008   ALT 25 09/26/2013   AST 20 09/26/2013   NA 139 09/26/2013   K 4.1 09/26/2013   CL 102 09/26/2013   CREATININE 0.64 09/26/2013   BUN 10 09/26/2013   CO2 28 09/26/2013   TSH 2.456 06/06/2013   HGBA1C 5.4 06/06/2013    No results found.   Assessment & Plan:  Plan  I am having Jo Jackson maintain her valACYclovir, SUMAtriptan, ondansetron, tiZANidine, albuterol, tiZANidine, traMADol, meloxicam, omeprazole, amphetamine-dextroamphetamine, and HYDROcodone-acetaminophen.  Meds ordered this encounter  Medications  . HYDROcodone-acetaminophen (NORCO) 10-325 MG tablet    Sig: Take 1 tablet by mouth every 6 (six) hours as needed.    Dispense:  30 tablet    Refill:  0    Problem List Items Addressed This Visit    None    Visit  Diagnoses    Chronic pain syndrome    -  Primary   Relevant Orders   Ambulatory referral to Pain Clinic   Fibromyalgia       Relevant Medications   HYDROcodone-acetaminophen (NORCO) 10-325 MG tablet   Other Relevant Orders   Ambulatory referral to Pain Clinic      Follow-up: No Follow-up on file.  Donato SchultzYvonne R Lowne Chase, DO

## 2016-03-02 NOTE — Progress Notes (Signed)
Pre visit review using our clinic review tool, if applicable. No additional management support is needed unless otherwise documented below in the visit note. 

## 2016-03-02 NOTE — Patient Instructions (Signed)

## 2016-03-08 ENCOUNTER — Telehealth: Payer: Self-pay | Admitting: Family Medicine

## 2016-03-08 NOTE — Telephone Encounter (Signed)
Relationship to patient: self Can be reached: [phone]   Reason for call: Pt states that she has another headache the same as last week 03/02/16. Light and sound sensitive, nauseous, etc. She states she has taken meds except imitrex and not feeling any better. I scheduled her for 1pm 8/17. She is not able to go to work tonight and is also needing a work note. She isn't sure if she needs appt or if she can get advice & work note. Please call in the morning.

## 2016-03-09 ENCOUNTER — Ambulatory Visit (INDEPENDENT_AMBULATORY_CARE_PROVIDER_SITE_OTHER): Payer: BLUE CROSS/BLUE SHIELD | Admitting: Family Medicine

## 2016-03-09 ENCOUNTER — Encounter: Payer: Self-pay | Admitting: Family Medicine

## 2016-03-09 DIAGNOSIS — G4486 Cervicogenic headache: Secondary | ICD-10-CM

## 2016-03-09 DIAGNOSIS — R51 Headache: Secondary | ICD-10-CM | POA: Diagnosis not present

## 2016-03-09 NOTE — Telephone Encounter (Signed)
She should try immitrex--- if it goes away we can cancel appointment and give her note

## 2016-03-09 NOTE — Telephone Encounter (Signed)
She took the Imitrex and it did not help. She said it is more in the neck. She has been advised to keep her apt for today.    KP

## 2016-03-09 NOTE — Telephone Encounter (Signed)
Please advise      KP 

## 2016-03-09 NOTE — Progress Notes (Signed)
Patient ID: Jo Jackson Wentland, female    DOB: Apr 11, 1971  Age: 45 y.o. MRN: [id number]    Subjective:  Subjective  HPI Jo Jackson Ryles presents for f/u migraine.  It started Monday night and has not resolved with her normal pain meds.   Pain management pending.  She needs f/u with ortho due to her neck.  Review of Systems  Constitutional: Positive for fatigue. Negative for activity change, appetite change and unexpected weight change.  Respiratory: Negative for cough and shortness of breath.   Cardiovascular: Negative for chest pain and palpitations.  Musculoskeletal: Positive for arthralgias, neck pain and neck stiffness.  Psychiatric/Behavioral: Negative for behavioral problems and dysphoric mood. The patient is not nervous/anxious.     History Past Medical History:  Diagnosis Date  . Abnormal Pap smear    Repeats WNL  . ADD (attention deficit disorder)   . Anxiety   . Arthritis    Oseteoarthritis  . Depression   . GERD (gastroesophageal reflux disease)   . Headache(784.0)    Migraines  . Junctional escape rhythm   . Neuromuscular disorder (HCC)    Fibromyalgia  . Pregnancy induced hypertension   . Supraventricular tachycardia (HCC)     She has a past surgical history that includes Appendectomy (1986); Carpal tunnel release (2000); Cervical fusion (08-24-99,04-26-00,05-09-01); Shoulder surgery (left-6-04, right 09-12-04); tongue polyp (1984); Ablation of dysrhythmic focus; and Knee sugery (Left).   Her family history includes COPD in her mother; Cancer in her paternal aunt; Heart disease in her mother; Hodgkin's lymphoma in her father.She reports that she quit smoking about 8 years ago. She has never used smokeless tobacco. She reports that she drinks alcohol. She reports that she does not use drugs.  Current Outpatient Prescriptions on File Prior to Visit  Medication Sig Dispense Refill  . albuterol (PROAIR HFA) 108 (90 Base) MCG/ACT inhaler Inhale 2 puffs into the lungs  every 6 (six) hours as needed for wheezing or shortness of breath. 1 Inhaler 2  . amphetamine-dextroamphetamine (ADDERALL) 30 MG tablet Take 1 tablet by mouth 2 (two) times daily. 180 tablet 0  . HYDROcodone-acetaminophen (NORCO) 10-325 MG tablet Take 1 tablet by mouth every 6 (six) hours as needed. 30 tablet 0  . meloxicam (MOBIC) 15 MG tablet Take 1 tablet (15 mg total) by mouth daily. 90 tablet 0  . omeprazole (PRILOSEC) 40 MG capsule Take 1 capsule (40 mg total) by mouth 2 (two) times daily before a meal. 180 capsule 3  . ondansetron (ZOFRAN) 8 MG tablet Take 1 tablet (8 mg total) by mouth 3 (three) times daily as needed for nausea or vomiting. 10 tablet 0  . SUMAtriptan (IMITREX) 50 MG tablet TAKE 1 TABLET BY MOUTH ONCE, MAY REPEAT IN 2 HOURS IF HEADACHE PERSISTS OR RECURS 10 tablet 2  . tiZANidine (ZANAFLEX) 4 MG tablet Take 1 tablet (4 mg total) by mouth every 6 (six) hours as needed for muscle spasms. 30 tablet 0  . tiZANidine (ZANAFLEX) 4 MG tablet TAKE 1 TABLET (4 MG TOTAL) BY MOUTH EVERY 6 (SIX) HOURS AS NEEDED FOR MUSCLE SPASMS. 30 tablet 0  . traMADol (ULTRAM) 50 MG tablet Take 1 tablet (50 mg total) by mouth every 8 (eight) hours as needed. 30 tablet 0  . valACYclovir (VALTREX) 1000 MG tablet TAKE 1 TABLET (1,000 MG TOTAL) BY MOUTH 3 (THREE) TIMES DAILY. 30 tablet 5   No current facility-administered medications on file prior to visit.      Objective:  Objective  Physical Exam  Constitutional: She is oriented to person, place, and time. She appears well-developed and well-nourished.  HENT:  Head: Normocephalic and atraumatic.  Eyes: Conjunctivae and EOM are normal.  Neck: No JVD present. Muscular tenderness present. Carotid bruit is not present. No thyromegaly present.    Cardiovascular: Normal rate, regular rhythm and normal heart sounds.   No murmur heard. Pulmonary/Chest: Effort normal and breath sounds normal. No respiratory distress. She has no wheezes. She has no rales.  She exhibits no tenderness.  Musculoskeletal: She exhibits no edema.  Neurological: She is alert and oriented to person, place, and time.  Psychiatric: She has a normal mood and affect.  Nursing note and vitals reviewed.  BP 124/86 (BP Location: Left Arm, Patient Position: Sitting, Cuff Size: Large)   Pulse 68   Temp 98.7 F (37.1 C) (Oral)   Wt 279 lb 9.6 oz (126.8 kg)   SpO2 98%   BMI 43.79 kg/m  Wt Readings from Last 3 Encounters:  03/09/16 279 lb 9.6 oz (126.8 kg)  03/02/16 283 lb (128.4 kg)  11/26/15 297 lb (134.7 kg)     Lab Results  Component Value Date   WBC 7.6 08/20/2015   HGB 13.7 08/20/2015   HCT 40.5 08/20/2015   PLT 279 08/20/2015   GLUCOSE 85 09/26/2013   CHOL 203 (H) 04/10/2008   TRIG 56 04/10/2008   HDL 82 04/10/2008   LDLCALC 110 (H) 04/10/2008   ALT 25 09/26/2013   AST 20 09/26/2013   NA 139 09/26/2013   K 4.1 09/26/2013   CL 102 09/26/2013   CREATININE 0.64 09/26/2013   BUN 10 09/26/2013   CO2 28 09/26/2013   TSH 2.456 06/06/2013   HGBA1C 5.4 06/06/2013    No results found.   Assessment & Plan:  Plan  I am having Ms. Camuso maintain her valACYclovir, SUMAtriptan, ondansetron, tiZANidine, albuterol, tiZANidine, traMADol, meloxicam, omeprazole, amphetamine-dextroamphetamine, and HYDROcodone-acetaminophen.  No orders of the defined types were placed in this encounter.   Problem List Items Addressed This Visit      Unprioritized   Cervicogenic headache    Pt has not seen neuro surgery or ortho in a low time Pain management pending She will f/u with Dr Noel Geroldohen       Other Visit Diagnoses   None.     Follow-up: No Follow-up on file.  Donato SchultzYvonne R Lowne Chase, DO

## 2016-03-09 NOTE — Progress Notes (Signed)
Pre visit review using our clinic review tool, if applicable. No additional management support is needed unless otherwise documented below in the visit note. 

## 2016-03-09 NOTE — Patient Instructions (Signed)

## 2016-03-11 NOTE — Assessment & Plan Note (Signed)
Pt has not seen neuro surgery or ortho in a low time Pain management pending She will f/u with Dr Noel Geroldohen

## 2016-03-16 ENCOUNTER — Encounter: Payer: Self-pay | Admitting: Family Medicine

## 2016-03-17 ENCOUNTER — Encounter: Payer: Self-pay | Admitting: Family Medicine

## 2016-03-17 NOTE — Telephone Encounter (Signed)
Ok to give note for tonight

## 2016-04-12 ENCOUNTER — Encounter: Payer: Self-pay | Admitting: Family Medicine

## 2016-04-12 ENCOUNTER — Other Ambulatory Visit: Payer: Self-pay | Admitting: Family Medicine

## 2016-04-12 DIAGNOSIS — G43809 Other migraine, not intractable, without status migrainosus: Secondary | ICD-10-CM

## 2016-04-13 ENCOUNTER — Encounter: Payer: Self-pay | Admitting: Family Medicine

## 2016-04-13 MED ORDER — TIZANIDINE HCL 4 MG PO TABS: 4.0000 mg | ORAL_TABLET | Freq: Four times a day (QID) | ORAL | 0 refills | Status: DC | PRN

## 2016-04-13 MED ORDER — TRAMADOL HCL 50 MG PO TABS: 50.0000 mg | ORAL_TABLET | Freq: Three times a day (TID) | ORAL | 0 refills | Status: DC | PRN

## 2016-04-13 NOTE — Telephone Encounter (Signed)
Last seen 03/09/16 and filled 12/23/15 #30 UDS 11/19/2015 low risk   Please advise     KP

## 2016-04-13 NOTE — Telephone Encounter (Signed)
Cymbalta is no longer on the medication list. Please advise   KP

## 2016-04-14 MED ORDER — DULOXETINE HCL 60 MG PO CPEP: 60.0000 mg | ORAL_CAPSULE | Freq: Every day | ORAL | 3 refills | Status: DC

## 2016-04-14 NOTE — Telephone Encounter (Signed)
Ok to send in 90 day of cymbalta with 3 refills

## 2016-05-03 ENCOUNTER — Encounter: Payer: Self-pay | Admitting: Family Medicine

## 2016-05-03 ENCOUNTER — Ambulatory Visit (INDEPENDENT_AMBULATORY_CARE_PROVIDER_SITE_OTHER): Payer: BLUE CROSS/BLUE SHIELD | Admitting: Family Medicine

## 2016-05-03 VITALS — BP 142/87 | HR 85 | Temp 98.0°F | Ht 67.0 in | Wt 277.8 lb

## 2016-05-03 DIAGNOSIS — J0110 Acute frontal sinusitis, unspecified: Secondary | ICD-10-CM

## 2016-05-03 DIAGNOSIS — J4521 Mild intermittent asthma with (acute) exacerbation: Secondary | ICD-10-CM | POA: Diagnosis not present

## 2016-05-03 MED ORDER — PREDNISONE 20 MG PO TABS: ORAL_TABLET | ORAL | 0 refills | Status: DC

## 2016-05-03 MED ORDER — CEFDINIR 300 MG PO CAPS: 300.0000 mg | ORAL_CAPSULE | Freq: Two times a day (BID) | ORAL | 0 refills | Status: DC

## 2016-05-03 NOTE — Patient Instructions (Signed)
It was good to see you today- I hope that you feel better soon!  Use the omnicef as directed for 10 days, and the prednisone for 3 days Continue OTC medications and your inhaler as needed Once you are well and off the prednisone for 10 days you can get your flu shot Please let me know if you are not improving soon!

## 2016-05-03 NOTE — Progress Notes (Signed)
[facility] [address], [address] [place], [place] [address] [phone]  Date:  05/03/2016   Name:  Jo Jackson   DOB:  1970-08-22   MRN:  [id number]  PCP:  [name], DO    Chief Complaint: Cough (c/o prod cough with yellow mucus, sinus pressure and headache x 1 week. Would like flu vaccine today. )   History of Present Illness:  Jo Jackson is a 45 y.o. very pleasant female patient who presents with the following:  Here today for a sick visit-  She has noted "the head and chest thing that everyone has."  She has noted a ST, sinus congestion, she feels like she might wheeze when she coughs.  She is coughing up some mucus.   She has noted sx for about one week- getting worse. She has not noted any fever.    No aches or chills.  She does have a HA. She notes sx in her chest and in her sinuses.   No GI symptoms.   She has tried mucinex, claritin D, flonase.    She does have an inhaler which she has used some recently.   She does not have asthma but notes history of illness related RAD.  Her sx will also be worse when exposed to extreme humidity or changes in air temp She missed work yesterday and also would like to be out tonight in order to rest.    Patient Active Problem List   Diagnosis Date Noted  . Rectal bleeding 08/20/2015  . Acute bronchitis 06/04/2015  . Sensation of fullness in right ear 04/07/2015  . Cervicogenic headache 11/06/2014  . Obesity (BMI 30-39.9) 06/24/2013  . Morbid obesity with BMI of 40.0-44.9, adult (HCC) 06/07/2013  . SVD (spontaneous vaginal delivery) 12/15/2012    Class: Status post  . HAIR LOSS 08/11/2010  . FATIGUE 08/11/2010  . SINUSITIS - ACUTE-NOS 06/29/2010  . GERD 06/29/2010  . PAIN IN THORACIC SPINE 05/12/2010  . Migraine without aura 02/10/2010  . ELEVATED BLOOD PRESSURE WITHOUT DIAGNOSIS OF HYPERTENSION 02/10/2010  . AMENORRHEA 04/26/2009  . ANXIETY  DEPRESSION 03/26/2009  . Depression 03/26/2009  . ALLERGIC REACTION 08/26/2008  . FIBROMYALGIA 03/05/2007  . ARTHROSCOPY, LEFT KNEE, HX OF 03/05/2007  . Attention deficit disorder 01/31/2007  . Headache 01/31/2007  . HX, PERSONAL, TOBACCO USE 01/31/2007    Past Medical History:  Diagnosis Date  . Abnormal Pap smear    Repeats WNL  . ADD (attention deficit disorder)   . Anxiety   . Arthritis    Oseteoarthritis  . Depression   . GERD (gastroesophageal reflux disease)   . Headache(784.0)    Migraines  . Junctional escape rhythm   . Neuromuscular disorder (HCC)    Fibromyalgia  . Pregnancy induced hypertension   . Supraventricular tachycardia [facility])     Past Surgical History:  Procedure Laterality Date  . ABLATION OF DYSRHYTHMIC FOCUS    . APPENDECTOMY  1986  . CARPAL TUNNEL RELEASE  2000  . CERVICAL FUSION  08-24-99,04-26-00,05-09-01  . Knee sugery Left    16 yrs. old  . SHOULDER SURGERY  left-6-04, right 09-12-04  . tongue polyp  1984    Social History  Substance Use Topics  . Smoking status: Former Smoker    Quit date: 10/23/2007  . Smokeless tobacco: Never Used  . Alcohol use 0.0 oz/week     Comment: Occ    Family History  Problem Relation Age of Onset  . Diabetes    . Thyroid disease    . Hypertension    . Depression    . Bipolar disorder    . Hodgkin's lymphoma Father   . COPD Mother   . Heart disease Mother     chf  . Cancer Paternal Aunt     breast    Allergies  Allergen Reactions  . Ketorolac Tromethamine Hives  . Morphine Other (See Comments)    "makes me the devil."  . Latex Rash and Other (See Comments)    Latex catheter caused extreme irritation    Medication list has been reviewed and updated.  Current Outpatient Prescriptions on File Prior to Visit  Medication Sig Dispense Refill  . albuterol (PROAIR HFA) 108 (90 Base) MCG/ACT inhaler Inhale 2 puffs into the lungs every 6 (six) hours as needed for wheezing or shortness of breath. 1  Inhaler 2  . amphetamine-dextroamphetamine (ADDERALL) 30 MG tablet Take 1 tablet by mouth 2 (two) times daily. 180 tablet 0  . DULoxetine (CYMBALTA) 60 MG capsule Take 1 capsule (60 mg total) by mouth daily. 90 capsule 3  . HYDROcodone-acetaminophen (NORCO) 10-325 MG tablet Take 1 tablet by mouth every 6 (six) hours as needed. 30 tablet 0  . meloxicam (MOBIC) 15 MG tablet Take 1 tablet (15 mg total) by mouth daily. 90 tablet 0  . omeprazole (PRILOSEC) 40 MG capsule Take 1 capsule (40 mg total) by mouth 2 (two) times daily before a meal. 180 capsule 3  . ondansetron (ZOFRAN) 8 MG tablet Take 1 tablet (8 mg total) by mouth 3 (three) times daily as needed for nausea or vomiting. 10 tablet 0  . SUMAtriptan (IMITREX) 50 MG tablet TAKE 1 TABLET BY MOUTH ONCE, MAY REPEAT IN 2 HOURS IF HEADACHE PERSISTS OR RECURS 10 tablet 2  . tiZANidine (ZANAFLEX) 4 MG tablet Take 1 tablet (4 mg total) by mouth every 6 (six) hours as needed for muscle spasms. 30 tablet 0  . tiZANidine (ZANAFLEX) 4 MG tablet Take 1 tablet (4 mg total) by mouth every 6 (six) hours as needed for muscle spasms. 30 tablet 0  . traMADol (ULTRAM) 50 MG tablet Take 1 tablet (50 mg total) by mouth every 8 (eight) hours as needed. 30 tablet 0  . valACYclovir (VALTREX) 1000 MG tablet TAKE 1 TABLET (1,000 MG TOTAL) BY MOUTH 3 (THREE) TIMES DAILY. 30 tablet 5   No current facility-administered medications on file prior to visit.     Review of Systems:  As per HPI- otherwise negative. Former smoker- she quit about 9 years ago She has 2 children ages 516 and 3    Physical Examination: Vitals:   05/03/16 0908 05/03/16 0914  BP: (!) 143/88 (!) 142/87  Pulse: 85   Temp: 98 F (36.7 C)    Vitals:   05/03/16 0908  Weight: 277 lb 12.8 oz (126 kg)  Height: 5\' 7"  (1.702 m)   Body mass index is 43.51 kg/m. Ideal Body Weight: Weight in (lb) to have BMI = 25: 159.3  GEN: WDWN, NAD, Non-toxic, A & O x 3, obese/ large build.  Looks well but is  coughing some in room HEENT: Atraumatic, Normocephalic. Neck supple. No masses, No LAD.  Bilateral TM wnl, oropharynx normal.  PEERL,EOMI.   Frontal sinus tenderness to percussion  Ears and Nose: No external deformity. CV: RRR, No M/G/R. No JVD. No thrill. No extra heart sounds. PULM: CTA B, no wheezes, crackles, rhonchi.  No retractions. No resp. distress. No accessory muscle use. EXTR: No c/c/e NEURO Normal gait.  PSYCH: Normally interactive. Conversant. Not depressed or anxious appearing.  Calm demeanor.    Assessment and Plan: Acute non-recurrent frontal sinusitis - Plan: cefdinir (OMNICEF) 300 MG capsule  Mild intermittent reactive airway disease with acute exacerbation - Plan: predniSONE (DELTASONE) 20 MG tablet  Here today with sx of sinuitis and possibly developing bronchitis, and illness associated RAD She has an inhaler to use as needed rx for omnicef and prednisone Close follow-up if not improving   Signed Abbe Amsterdam, MD

## 2016-05-03 NOTE — Progress Notes (Signed)
Pre visit review using our clinic review tool, if applicable. No additional management support is needed unless otherwise documented below in the visit note. 

## 2016-05-11 ENCOUNTER — Encounter: Payer: Self-pay | Admitting: Family Medicine

## 2016-05-11 ENCOUNTER — Ambulatory Visit (INDEPENDENT_AMBULATORY_CARE_PROVIDER_SITE_OTHER): Payer: BLUE CROSS/BLUE SHIELD | Admitting: Family Medicine

## 2016-05-11 VITALS — BP 120/76 | HR 73 | Temp 97.8°F | Ht 67.0 in | Wt 281.4 lb

## 2016-05-11 DIAGNOSIS — T148XXA Other injury of unspecified body region, initial encounter: Secondary | ICD-10-CM

## 2016-05-11 DIAGNOSIS — Z23 Encounter for immunization: Secondary | ICD-10-CM

## 2016-05-11 DIAGNOSIS — S8012XA Contusion of left lower leg, initial encounter: Secondary | ICD-10-CM | POA: Diagnosis not present

## 2016-05-11 NOTE — Progress Notes (Signed)
Chief Complaint  Patient presents with  . Fall    last Friday-hit (B) knees and (R) elbow-pt has been seen by Ortho and x-rays have been done.    Subjective: Patient is a 45 y.o. female here for bruising of her LE.  Larey SeatFell last Friday, had late onset of extensive bruising over her LLE. Imaging unremarkable with ortho, not having pain. Concerned with bruising. Not on antiplatelets or anticoagulants. She doesn't have a personal or family history of bleeding disorders. She denies lightheadedness, dizziness, or SOB.  ROS: Heme: +bruising Lungs: Denies SOB  Family History  Problem Relation Age of Onset  . Diabetes    . Thyroid disease    . Hypertension    . Depression    . Bipolar disorder    . Hodgkin's lymphoma Father   . COPD Mother   . Heart disease Mother     chf  . Cancer Paternal Aunt     breast   Past Medical History:  Diagnosis Date  . Abnormal Pap smear    Repeats WNL  . ADD (attention deficit disorder)   . Anxiety   . Arthritis    Oseteoarthritis  . Depression   . GERD (gastroesophageal reflux disease)   . Headache(784.0)    Migraines  . Junctional escape rhythm   . Neuromuscular disorder (HCC)    Fibromyalgia  . Pregnancy induced hypertension   . Supraventricular tachycardia (HCC)    Allergies  Allergen Reactions  . Ketorolac Tromethamine Hives  . Morphine Other (See Comments)    "makes me the devil."  . Latex Rash and Other (See Comments)    Latex catheter caused extreme irritation    Current Outpatient Prescriptions:  .  albuterol (PROAIR HFA) 108 (90 Base) MCG/ACT inhaler, Inhale 2 puffs into the lungs every 6 (six) hours as needed for wheezing or shortness of breath., Disp: 1 Inhaler, Rfl: 2 .  amphetamine-dextroamphetamine (ADDERALL) 30 MG tablet, Take 1 tablet by mouth 2 (two) times daily., Disp: 180 tablet, Rfl: 0 .  diclofenac sodium (VOLTAREN) 1 % GEL, Apply 2-4GM 3-4 times a day to affected area., Disp: , Rfl: 2 .  DULoxetine (CYMBALTA) 60 MG  capsule, Take 1 capsule (60 mg total) by mouth daily., Disp: 90 capsule, Rfl: 3 .  HYDROcodone-acetaminophen (NORCO) 10-325 MG tablet, Take 1 tablet by mouth every 6 (six) hours as needed., Disp: 30 tablet, Rfl: 0 .  meloxicam (MOBIC) 15 MG tablet, Take 1 tablet (15 mg total) by mouth daily., Disp: 90 tablet, Rfl: 0 .  omeprazole (PRILOSEC) 40 MG capsule, Take 1 capsule (40 mg total) by mouth 2 (two) times daily before a meal., Disp: 180 capsule, Rfl: 3 .  ondansetron (ZOFRAN) 8 MG tablet, Take 1 tablet (8 mg total) by mouth 3 (three) times daily as needed for nausea or vomiting., Disp: 10 tablet, Rfl: 0 .  SUMAtriptan (IMITREX) 50 MG tablet, TAKE 1 TABLET BY MOUTH ONCE, MAY REPEAT IN 2 HOURS IF HEADACHE PERSISTS OR RECURS, Disp: 10 tablet, Rfl: 2 .  tiZANidine (ZANAFLEX) 4 MG tablet, Take 1 tablet (4 mg total) by mouth every 6 (six) hours as needed for muscle spasms., Disp: 30 tablet, Rfl: 0 .  traMADol (ULTRAM) 50 MG tablet, Take 1 tablet (50 mg total) by mouth every 8 (eight) hours as needed., Disp: 30 tablet, Rfl: 0 .  valACYclovir (VALTREX) 1000 MG tablet, TAKE 1 TABLET (1,000 MG TOTAL) BY MOUTH 3 (THREE) TIMES DAILY., Disp: 30 tablet, Rfl: 5  Objective: BP  120/76 (BP Location: Left Arm, Patient Position: Sitting, Cuff Size: Large)   Pulse 73   Temp 97.8 F (36.6 C) (Oral)   Ht 5\' 7"  (1.702 m)   Wt 281 lb 6.4 oz (127.6 kg)   LMP 04/06/2016   SpO2 98%   BMI 44.07 kg/m  General: Awake, appears stated age HEENT: MMM, EOMi Heart: RRR, no murmurs, 1+ pitting edema b/l Lungs: CTAB, no rales, wheezes or rhonchi. Normal effort MSK: No calf pain Skin: Extensive bruising over the anterior and medial aspect of LLE, there is also some ecchymosis on the medial and lateral portion of the foot. Psych: Age appropriate judgment and insight, normal affect and mood  Assessment and Plan: Bruising  Orders as above. Avoid NSAIDs if possible. Elevated legs at night. I am not concerned for active  bleeding. It will turn yellow before resolving. F/u prn. The patient voiced understanding and agreement to the plan.  Jilda Roche Ramos, DO 05/11/16  5:01 PM

## 2016-05-25 ENCOUNTER — Encounter: Payer: Self-pay | Admitting: Family Medicine

## 2016-05-25 NOTE — Telephone Encounter (Signed)
We refer her to psych---- she needs to make the call-- we have a list in [facility] files of different psych in area she can call

## 2016-05-26 NOTE — Telephone Encounter (Signed)
I answered this yesterday---- not sure why it came back Pt needs psych-- we have a list that we refer to a lot  She can call a psychiatrist of that list and make an appointment

## 2016-05-29 ENCOUNTER — Telehealth: Payer: Self-pay

## 2016-05-29 NOTE — Telephone Encounter (Signed)
Pt needs psych -- she will have to make appointment

## 2016-05-29 NOTE — Telephone Encounter (Signed)
Spoke with patient and provide patient with a list of psychiatric physician's number. Patient had no further questions or concerns at this time. Patient states she will follow up with PCP after seeing a psychiatrists

## 2016-05-30 ENCOUNTER — Encounter: Payer: Self-pay | Admitting: Family Medicine

## 2016-05-30 NOTE — Telephone Encounter (Signed)
Please advise 

## 2016-05-31 ENCOUNTER — Other Ambulatory Visit: Payer: Self-pay | Admitting: Family Medicine

## 2016-05-31 MED ORDER — VILAZODONE HCL 10 & 20 MG PO KIT: 1.0000 | PACK | Freq: Every day | ORAL | 0 refills | Status: DC

## 2016-06-01 NOTE — Telephone Encounter (Signed)
protonix 40 mg 1 po qd #30  2 refills 

## 2016-06-06 ENCOUNTER — Other Ambulatory Visit: Payer: Self-pay | Admitting: Family Medicine

## 2016-06-06 DIAGNOSIS — M797 Fibromyalgia: Secondary | ICD-10-CM

## 2016-06-06 DIAGNOSIS — F988 Other specified behavioral and emotional disorders with onset usually occurring in childhood and adolescence: Secondary | ICD-10-CM

## 2016-06-06 DIAGNOSIS — G43809 Other migraine, not intractable, without status migrainosus: Secondary | ICD-10-CM

## 2016-06-07 MED ORDER — HYDROCODONE-ACETAMINOPHEN 10-325 MG PO TABS: 1.0000 | ORAL_TABLET | Freq: Four times a day (QID) | ORAL | 0 refills | Status: DC | PRN

## 2016-06-07 MED ORDER — TRAMADOL HCL 50 MG PO TABS: 50.0000 mg | ORAL_TABLET | Freq: Three times a day (TID) | ORAL | 0 refills | Status: DC | PRN

## 2016-06-07 MED ORDER — AMPHETAMINE-DEXTROAMPHETAMINE 30 MG PO TABS: 30.0000 mg | ORAL_TABLET | Freq: Two times a day (BID) | ORAL | 0 refills | Status: DC

## 2016-06-07 MED ORDER — TIZANIDINE HCL 4 MG PO TABS: 4.0000 mg | ORAL_TABLET | Freq: Four times a day (QID) | ORAL | 0 refills | Status: DC | PRN

## 2016-06-07 NOTE — Telephone Encounter (Signed)
Last OV: 03/09/16 with PCP Adderall: #180, 02/11/16 Norco: #30, 03/02/16 Tizanidine: #30, 04/13/16 Tramadol: #30, 04/13/16 UDS: low risk, 11/19/15 and due 05/20/16  Rxs printed and forwarded to PCP for signature.

## 2016-06-08 ENCOUNTER — Other Ambulatory Visit: Payer: Self-pay

## 2016-06-08 DIAGNOSIS — M797 Fibromyalgia: Secondary | ICD-10-CM

## 2016-06-08 MED ORDER — HYDROCODONE-ACETAMINOPHEN 10-325 MG PO TABS
1.0000 | ORAL_TABLET | Freq: Four times a day (QID) | ORAL | 0 refills | Status: DC | PRN
Start: 2016-06-08 — End: 2016-09-05

## 2016-06-08 NOTE — Telephone Encounter (Signed)
Rx refill completed. Pt picked up Rx 06/07/16, and others were faxed to pharmacy. LB

## 2016-06-20 ENCOUNTER — Encounter: Payer: Self-pay | Admitting: Family Medicine

## 2016-06-25 ENCOUNTER — Encounter: Payer: Self-pay | Admitting: Family Medicine

## 2016-06-29 ENCOUNTER — Other Ambulatory Visit: Payer: Self-pay | Admitting: Family Medicine

## 2016-06-29 ENCOUNTER — Ambulatory Visit: Payer: Self-pay | Admitting: Medical

## 2016-06-29 DIAGNOSIS — J208 Acute bronchitis due to other specified organisms: Secondary | ICD-10-CM

## 2016-06-29 NOTE — Telephone Encounter (Signed)
Patient called to follow up on request for refill. Plse send to pharmacy as soon as possible.

## 2016-06-30 ENCOUNTER — Ambulatory Visit: Payer: Self-pay | Admitting: Medical

## 2016-06-30 ENCOUNTER — Other Ambulatory Visit: Payer: Self-pay | Admitting: *Deleted

## 2016-06-30 ENCOUNTER — Ambulatory Visit (INDEPENDENT_AMBULATORY_CARE_PROVIDER_SITE_OTHER): Payer: BLUE CROSS/BLUE SHIELD | Admitting: Family Medicine

## 2016-06-30 ENCOUNTER — Encounter: Payer: Self-pay | Admitting: Family Medicine

## 2016-06-30 VITALS — BP 131/89 | HR 79 | Temp 98.0°F | Ht 67.0 in | Wt 275.2 lb

## 2016-06-30 DIAGNOSIS — J0140 Acute pansinusitis, unspecified: Secondary | ICD-10-CM

## 2016-06-30 MED ORDER — AMOXICILLIN-POT CLAVULANATE 875-125 MG PO TABS: 1.0000 | ORAL_TABLET | Freq: Two times a day (BID) | ORAL | 0 refills | Status: DC

## 2016-06-30 MED ORDER — VILAZODONE HCL 20 MG PO TABS: 1.0000 | ORAL_TABLET | Freq: Every day | ORAL | 1 refills | Status: DC

## 2016-06-30 NOTE — Patient Instructions (Signed)

## 2016-06-30 NOTE — Progress Notes (Signed)
Pre visit review using our clinic tool,if applicable. No additional management support is needed unless otherwise documented below in the visit note.  

## 2016-06-30 NOTE — Progress Notes (Signed)
Subjective:     Jo LoboMichele Jackson is a 45 y.o. female who presents for evaluation of sinus pain. Symptoms include: congestion, cough, facial pain, fevers, headaches, nasal congestion, post nasal drip, sinus pressure and sore throat. Onset of symptoms was 2 days ago. Symptoms have been gradually worsening since that time. Past history is significant for no history of pneumonia or bronchitis. Patient is a former smoker. Pt using mucinex , proair and has not used her flonase --- she lost her zyrtec but ordered more  The following portions of the patient's history were reviewed and updated as appropriate: She  has a past medical history of Abnormal Pap smear; ADD (attention deficit disorder); Anxiety; Arthritis; Depression; GERD (gastroesophageal reflux disease); Headache(784.0); Junctional escape rhythm; Neuromuscular disorder (HCC); Pregnancy induced hypertension; and Supraventricular tachycardia (HCC). She  does not have any pertinent problems on file. She  has a past surgical history that includes Appendectomy (1986); Carpal tunnel release (2000); Cervical fusion (08-24-99,04-26-00,05-09-01); Shoulder surgery (left-6-04, right 09-12-04); tongue polyp (1984); Ablation of dysrhythmic focus; and Knee sugery (Left). Her family history includes COPD in her mother; Cancer in her paternal aunt; Heart disease in her mother; Hodgkin's lymphoma in her father. She  reports that she quit smoking about 8 years ago. She has never used smokeless tobacco. She reports that she drinks alcohol. She reports that she does not use drugs. She has a current medication list which includes the following prescription(s): amphetamine-dextroamphetamine, diclofenac sodium, hydrocodone-acetaminophen, meloxicam, omeprazole, ondansetron, proair hfa, sumatriptan, tizanidine, tramadol, valacyclovir, vilazodone hcl, and amoxicillin-clavulanate. Current Outpatient Prescriptions on File Prior to Visit  Medication Sig Dispense Refill  .  amphetamine-dextroamphetamine (ADDERALL) 30 MG tablet Take 1 tablet by mouth 2 (two) times daily. 180 tablet 0  . diclofenac sodium (VOLTAREN) 1 % GEL Apply 2-4GM 3-4 times a day to affected area.  2  . HYDROcodone-acetaminophen (NORCO) 10-325 MG tablet Take 1 tablet by mouth every 6 (six) hours as needed. 30 tablet 0  . meloxicam (MOBIC) 15 MG tablet Take 1 tablet (15 mg total) by mouth daily. 90 tablet 0  . omeprazole (PRILOSEC) 40 MG capsule Take 1 capsule (40 mg total) by mouth 2 (two) times daily before a meal. 180 capsule 3  . ondansetron (ZOFRAN) 8 MG tablet Take 1 tablet (8 mg total) by mouth 3 (three) times daily as needed for nausea or vomiting. 10 tablet 0  . PROAIR HFA 108 (90 Base) MCG/ACT inhaler INHALE 2 PUFFS INTO LUNGS EVERY 6 HOURS AS NEEDED FOR WHEEZING OR SHORTNESS OF BREATH 8.5 Inhaler 2  . SUMAtriptan (IMITREX) 50 MG tablet TAKE 1 TABLET BY MOUTH ONCE, MAY REPEAT IN 2 HOURS IF HEADACHE PERSISTS OR RECURS 10 tablet 2  . tiZANidine (ZANAFLEX) 4 MG tablet Take 1 tablet (4 mg total) by mouth every 6 (six) hours as needed for muscle spasms. 30 tablet 0  . traMADol (ULTRAM) 50 MG tablet Take 1 tablet (50 mg total) by mouth every 8 (eight) hours as needed. 30 tablet 0  . valACYclovir (VALTREX) 1000 MG tablet TAKE 1 TABLET (1,000 MG TOTAL) BY MOUTH 3 (THREE) TIMES DAILY. 30 tablet 5   No current facility-administered medications on file prior to visit.    She is allergic to ketorolac tromethamine; morphine; and latex..  Review of Systems Pertinent items are noted in HPI.   Objective:    BP 131/89   Pulse 79   Temp 98 F (36.7 C) (Oral)   Ht 5\' 7"  (1.702 m)   Wt 275 lb  3.2 oz (124.8 kg)   LMP 06/06/2016   SpO2 100%   BMI 43.10 kg/m  General appearance: alert, cooperative, appears stated age and no distress Ears: normal TM's and external ear canals both ears Nose: green and yellow discharge, severe congestion, turbinates red, swollen, sinus tenderness bilateral Throat:  abnormal findings: moderate oropharyngeal erythema Neck: mild anterior cervical adenopathy, supple, symmetrical, trachea midline and thyroid not enlarged, symmetric, no tenderness/mass/nodules Lungs: clear to auscultation bilaterally Heart: S1, S2 normal    Assessment:    Acute bacterial sinusitis.    Plan:    Nasal saline sprays. Nasal steroids per medication orders. Antihistamines per medication orders.   rx for abx given to fill if symptoms worsen next week

## 2016-07-01 ENCOUNTER — Encounter: Payer: Self-pay | Admitting: Family Medicine

## 2016-07-03 ENCOUNTER — Encounter: Payer: Self-pay | Admitting: Family Medicine

## 2016-07-03 NOTE — Telephone Encounter (Signed)
Do not charge no show please

## 2016-09-05 ENCOUNTER — Other Ambulatory Visit: Payer: Self-pay | Admitting: Family Medicine

## 2016-09-05 DIAGNOSIS — G43809 Other migraine, not intractable, without status migrainosus: Secondary | ICD-10-CM

## 2016-09-05 DIAGNOSIS — F988 Other specified behavioral and emotional disorders with onset usually occurring in childhood and adolescence: Secondary | ICD-10-CM

## 2016-09-05 DIAGNOSIS — M797 Fibromyalgia: Secondary | ICD-10-CM

## 2016-09-05 MED ORDER — TRAMADOL HCL 50 MG PO TABS: 50.0000 mg | ORAL_TABLET | Freq: Three times a day (TID) | ORAL | 0 refills | Status: DC | PRN

## 2016-09-05 MED ORDER — HYDROCODONE-ACETAMINOPHEN 10-325 MG PO TABS: 1.0000 | ORAL_TABLET | Freq: Four times a day (QID) | ORAL | 0 refills | Status: DC | PRN

## 2016-09-05 MED ORDER — TIZANIDINE HCL 4 MG PO TABS: 4.0000 mg | ORAL_TABLET | Freq: Four times a day (QID) | ORAL | 0 refills | Status: DC | PRN

## 2016-09-05 MED ORDER — AMPHETAMINE-DEXTROAMPHETAMINE 30 MG PO TABS: 30.0000 mg | ORAL_TABLET | Freq: Two times a day (BID) | ORAL | 0 refills | Status: DC

## 2016-09-05 MED ORDER — ONDANSETRON HCL 8 MG PO TABS: 8.0000 mg | ORAL_TABLET | Freq: Three times a day (TID) | ORAL | 0 refills | Status: DC | PRN

## 2016-09-05 NOTE — Telephone Encounter (Signed)
Called the patient informed to pickup hardcopy for Adderall, Tramadol, hydrocodone.

## 2016-09-05 NOTE — Telephone Encounter (Signed)
Requesting:  Hydrocodone, Tramadol, Adderall Contract   Signed on 08/28/2014 UDS    Low risk-- next due 01/12/2017 Last OV   06/30/2016 Last Refill    Adderall--#180   06/07/2016                     Hydrocodone--#30   06/08/2016                     Tramadol--#30    06/07/2016  Please Advise

## 2016-09-21 ENCOUNTER — Other Ambulatory Visit: Payer: Self-pay | Admitting: Family Medicine

## 2016-09-21 ENCOUNTER — Encounter: Payer: Self-pay | Admitting: Family Medicine

## 2016-09-21 MED ORDER — ONDANSETRON HCL 8 MG PO TABS: 8.0000 mg | ORAL_TABLET | Freq: Three times a day (TID) | ORAL | 0 refills | Status: DC | PRN

## 2016-10-18 ENCOUNTER — Telehealth: Payer: Self-pay | Admitting: Medical

## 2016-10-18 ENCOUNTER — Ambulatory Visit (INDEPENDENT_AMBULATORY_CARE_PROVIDER_SITE_OTHER): Payer: BLUE CROSS/BLUE SHIELD | Admitting: Medical

## 2016-10-18 ENCOUNTER — Encounter: Payer: Self-pay | Admitting: Medical

## 2016-10-18 ENCOUNTER — Telehealth (INDEPENDENT_AMBULATORY_CARE_PROVIDER_SITE_OTHER): Payer: BLUE CROSS/BLUE SHIELD | Admitting: Medical

## 2016-10-18 VITALS — BP 123/93 | HR 80 | Temp 98.2°F | Ht 67.0 in | Wt 274.0 lb

## 2016-10-18 DIAGNOSIS — M791 Myalgia, unspecified site: Secondary | ICD-10-CM

## 2016-10-18 DIAGNOSIS — M545 Low back pain: Secondary | ICD-10-CM | POA: Diagnosis not present

## 2016-10-18 DIAGNOSIS — R197 Diarrhea, unspecified: Secondary | ICD-10-CM | POA: Diagnosis not present

## 2016-10-18 LAB — POCT URINALYSIS DIPSTICK
BILIRUBIN UA: NEGATIVE
GLUCOSE UA: NEGATIVE
KETONES UA: NEGATIVE
Leukocytes, UA: NEGATIVE
Nitrite, UA: NEGATIVE
Protein, UA: NEGATIVE
RBC UA: NEGATIVE
Spec Grav, UA: 1.02 (ref 1.030–1.035)
Urobilinogen, UA: 4 — AB (ref ?–2.0)
pH, UA: 6 (ref 5.0–8.0)

## 2016-10-18 MED ORDER — ONDANSETRON 8 MG PO TBDP: 8.0000 mg | ORAL_TABLET | Freq: Three times a day (TID) | ORAL | 0 refills | Status: DC | PRN

## 2016-10-18 NOTE — Telephone Encounter (Signed)
Flu test was negative

## 2016-10-18 NOTE — Progress Notes (Signed)
Pre visit review using our clinic review tool, if applicable. No additional management support is needed unless otherwise documented below in the visit note. 

## 2016-10-18 NOTE — Telephone Encounter (Signed)
Flu test was negative. Please result.

## 2016-10-18 NOTE — Patient Instructions (Addendum)
For your diarrhea recommend hydrate well with propel. Bland diet and use immodium otc. For  nausea zofran rx.  If tomorrow diarrhea persist turn in stool studies.  For hx of uti when had myaglias years ago we will get urine culture.  Flu test was negative today.  Work excuse.   Follow up in 5-7 days or as needed

## 2016-10-18 NOTE — Telephone Encounter (Signed)
Future lab placed  

## 2016-10-18 NOTE — Progress Notes (Signed)
Subjective:    Patient ID: Ronald LoboMichele Nieto, female    DOB: Oct 10, 1970, 46 y.o.   MRN: [id number]  HPI   Pt states on Monday she woke feeling feverish with sweating. On Tuesday had about 6 watery loose stools. No recent antibiotic. No vomiting but nausea. No loose stools today but took imodium today. Pt had some body aches since Monday morning. Some back. No pain on urination.  Pt is hydrating well.   LMP- 2 weeks. No relations since her kids sleep in bed.   Hx of remote in kidney infection that started out with body aches.    Review of Systems  Constitutional: Positive for diaphoresis, fatigue and fever.  HENT: Negative for congestion, ear pain, sinus pain and sinus pressure.   Respiratory: Negative for cough, choking, chest tightness, shortness of breath and wheezing.   Cardiovascular: Negative for chest pain and palpitations.  Gastrointestinal: Positive for diarrhea and nausea. Negative for abdominal pain and vomiting.  Genitourinary: Negative for decreased urine volume, dysuria, flank pain, frequency, hematuria, vaginal discharge and vaginal pain.  Musculoskeletal: Positive for myalgias.  Neurological: Negative for dizziness and headaches.  Hematological: Negative for adenopathy. Does not bruise/bleed easily.  Psychiatric/Behavioral: Negative for behavioral problems, confusion, dysphoric mood and hallucinations.   Past Medical History:  Diagnosis Date  . Abnormal Pap smear    Repeats WNL  . ADD (attention deficit disorder)   . Anxiety   . Arthritis    Oseteoarthritis  . Depression   . GERD (gastroesophageal reflux disease)   . Headache(784.0)    Migraines  . Junctional escape rhythm   . Neuromuscular disorder (HCC)    Fibromyalgia  . Pregnancy induced hypertension   . Supraventricular tachycardia [facility](HCC)      Social History   Social History  . Marital status: Married    Spouse name: N/A  . Number of children: 2  . Years of education: N/A   Occupational  History  .  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 [employer]   Social History Main Topics  . Smoking status: Former Smoker    Quit date: 10/23/2007  . Smokeless tobacco: Never Used  . Alcohol use 0.0 oz/week     Comment: Occ  . Drug use: No  . Sexual activity: Yes   Other Topics Concern  . Not on file   Social History Narrative   G1p1    Past Surgical History:  Procedure Laterality Date  . ABLATION OF DYSRHYTHMIC FOCUS    . APPENDECTOMY  1986  . CARPAL TUNNEL RELEASE  2000  . CERVICAL FUSION  08-24-99,04-26-00,05-09-01  . Knee sugery Left    16 yrs. old  . SHOULDER SURGERY  left-6-04, right 09-12-04  . tongue polyp  1984    Family History  Problem Relation Age of Onset  . Diabetes    . Thyroid disease    . Hypertension    . Depression    . Bipolar disorder    . Hodgkin's lymphoma Father   . COPD Mother   . Heart disease Mother     chf  . Cancer Paternal Aunt     breast    Allergies  Allergen Reactions  . Ketorolac Tromethamine Hives  . Morphine Other (See Comments)    "makes me the devil."  . Latex Rash and Other (See Comments)    Latex catheter caused extreme irritation    Current Outpatient Prescriptions on File Prior to Visit  Medication Sig Dispense Refill  .  amoxicillin-clavulanate (AUGMENTIN) 875-125 MG tablet Take 1 tablet by mouth 2 (two) times daily. 20 tablet 0  . amphetamine-dextroamphetamine (ADDERALL) 30 MG tablet Take 1 tablet by mouth 2 (two) times daily. 180 tablet 0  . diclofenac sodium (VOLTAREN) 1 % GEL Apply 2-4GM 3-4 times a day to affected area.  2  . HYDROcodone-acetaminophen (NORCO) 10-325 MG tablet Take 1 tablet by mouth every 6 (six) hours as needed. 30 tablet 0  . meloxicam (MOBIC) 15 MG tablet Take 1 tablet (15 mg total) by mouth daily. 90 tablet 0  . omeprazole (PRILOSEC) 40 MG capsule Take 1 capsule (40 mg total) by mouth 2 (two) times daily before a meal. 180 capsule 3  . ondansetron (ZOFRAN) 8 MG tablet Take 1 tablet (8 mg  total) by mouth 3 (three) times daily as needed for nausea or vomiting. 10 tablet 0  . PROAIR HFA 108 (90 Base) MCG/ACT inhaler INHALE 2 PUFFS INTO LUNGS EVERY 6 HOURS AS NEEDED FOR WHEEZING OR SHORTNESS OF BREATH 8.5 Inhaler 2  . SUMAtriptan (IMITREX) 50 MG tablet TAKE 1 TABLET BY MOUTH ONCE, MAY REPEAT IN 2 HOURS IF HEADACHE PERSISTS OR RECURS 10 tablet 2  . tiZANidine (ZANAFLEX) 4 MG tablet Take 1 tablet (4 mg total) by mouth every 6 (six) hours as needed for muscle spasms. 30 tablet 0  . traMADol (ULTRAM) 50 MG tablet Take 1 tablet (50 mg total) by mouth every 8 (eight) hours as needed. 30 tablet 0  . valACYclovir (VALTREX) 1000 MG tablet TAKE 1 TABLET (1,000 MG TOTAL) BY MOUTH 3 (THREE) TIMES DAILY. 30 tablet 5  . Vilazodone HCl (VIIBRYD) 20 MG TABS Take 1 tablet (20 mg total) by mouth daily. 30 tablet 1   No current facility-administered medications on file prior to visit.     BP (!) 123/93 (BP Location: Left Arm, Patient Position: Sitting, Cuff Size: Large)   Pulse 80   Temp 98.2 F (36.8 C) (Oral)   Ht 5\' 7"  (1.702 m)   Wt 274 lb (124.3 kg)   SpO2 100%   BMI 42.91 kg/m       Objective:   Physical Exam   General Appearance- Not in acute distress.  HEENT Eyes- Scleraeral/Conjuntiva-bilat- Not Yellow. Mouth & Throat- Normal.  Chest and Lung Exam Auscultation: Breath sounds:-Normal. Adventitious sounds:- No Adventitious sounds.  Cardiovascular Auscultation:Rythm - Regular. Heart Sounds -Normal heart sounds.  Abdomen Inspection:-Inspection Normal.  Palpation/Perucssion: Palpation and Percussion of the abdomen reveal- Non Tender, No Rebound tenderness, No rigidity(Guarding) and No Palpable abdominal masses.  Liver:-Normal.  Spleen:- Normal.   Back - no cva tenderness.      Assessment & Plan:  For your diarrhea recommend hydrate well with propel. Bland diet and use immodium otc. For  nausea zofran rx.  If tomorrow diarrhea persist turn in stool studies.  For  hx of uti when had myaglias years ago we will get urine culture.  Flu test was negative today.  Work excuse.   Follow up in 5-7 days or as needed  Kerim Statzer, Ramon Dredge, [employer]

## 2016-10-19 ENCOUNTER — Other Ambulatory Visit (INDEPENDENT_AMBULATORY_CARE_PROVIDER_SITE_OTHER): Payer: BLUE CROSS/BLUE SHIELD

## 2016-10-19 ENCOUNTER — Telehealth: Payer: Self-pay | Admitting: Family Medicine

## 2016-10-19 DIAGNOSIS — R739 Hyperglycemia, unspecified: Secondary | ICD-10-CM

## 2016-10-19 DIAGNOSIS — R197 Diarrhea, unspecified: Secondary | ICD-10-CM | POA: Diagnosis not present

## 2016-10-19 DIAGNOSIS — E876 Hypokalemia: Secondary | ICD-10-CM

## 2016-10-19 LAB — COMPREHENSIVE METABOLIC PANEL
ALBUMIN: 3.9 g/dL (ref 3.5–5.2)
ALT: 19 U/L (ref 0–35)
AST: 20 U/L (ref 0–37)
Alkaline Phosphatase: 56 U/L (ref 39–117)
BUN: 9 mg/dL (ref 6–23)
CO2: 31 mEq/L (ref 19–32)
CREATININE: 0.7 mg/dL (ref 0.40–1.20)
Calcium: 8.9 mg/dL (ref 8.4–10.5)
Chloride: 100 mEq/L (ref 96–112)
GFR: 96.29 mL/min (ref >60.00)
Glucose, Bld: 110 mg/dL — ABNORMAL HIGH (ref 70–99)
POTASSIUM: 3.2 meq/L — AB (ref 3.5–5.1)
SODIUM: 136 meq/L (ref 135–145)
Total Bilirubin: 0.6 mg/dL (ref 0.2–1.2)
Total Protein: 6.8 g/dL (ref 6.0–8.3)

## 2016-10-19 LAB — CBC WITH DIFFERENTIAL/PLATELET
BASOS ABS: 0 10*3/uL (ref 0.0–0.1)
BASOS PCT: 0.4 % (ref 0.0–3.0)
Eosinophils Absolute: 0.1 10*3/uL (ref 0.0–0.7)
Eosinophils Relative: 1.9 % (ref 0.0–5.0)
HEMATOCRIT: 40.4 % (ref 36.0–46.0)
HEMOGLOBIN: 14.2 g/dL (ref 12.0–15.0)
LYMPHS PCT: 30.1 % (ref 12.0–46.0)
Lymphs Abs: 1.4 10*3/uL (ref 0.7–4.0)
MCHC: 35 g/dL (ref 30.0–36.0)
MCV: 83.4 fl (ref 78.0–100.0)
Monocytes Absolute: 0.5 10*3/uL (ref 0.1–1.0)
Monocytes Relative: 11 % (ref 3.0–12.0)
NEUTROS ABS: 2.6 10*3/uL (ref 1.4–7.7)
Neutrophils Relative %: 56.6 % (ref 43.0–77.0)
PLATELETS: 241 10*3/uL (ref 150.0–400.0)
RBC: 4.85 Mil/uL (ref 3.87–5.11)
RDW: 12.1 % (ref 11.5–15.5)
WBC: 4.6 10*3/uL (ref 4.0–10.5)

## 2016-10-19 LAB — POCT INFLUENZA A/B
INFLUENZA B, POC: NEGATIVE
Influenza A, POC: NEGATIVE

## 2016-10-19 NOTE — Telephone Encounter (Signed)
7 loose stools this morning. Took immodium at 5am. She is going to take another immodium now. She said we have stool sample and she just left our office from giving blood sample. She wanted provider to be aware.  Ph# [phone] Ronald LoboMichele Weedman

## 2016-10-20 LAB — OVA AND PARASITE EXAMINATION: OP: NONE SEEN

## 2016-10-20 LAB — URINE CULTURE: Organism ID, Bacteria: NO GROWTH

## 2016-10-20 LAB — CLOSTRIDIUM DIFFICILE BY PCR: CDIFFPCR: NOT DETECTED

## 2016-10-20 MED ORDER — POTASSIUM CHLORIDE ER 10 MEQ PO TBCR: 10.0000 meq | EXTENDED_RELEASE_TABLET | Freq: Every day | ORAL | 0 refills | Status: DC

## 2016-10-20 NOTE — Telephone Encounter (Signed)
rx potassium sent to pt pharmacy.

## 2016-10-22 ENCOUNTER — Encounter: Payer: Self-pay | Admitting: Medical

## 2016-10-22 DIAGNOSIS — R195 Other fecal abnormalities: Secondary | ICD-10-CM

## 2016-10-22 LAB — STOOL CULTURE

## 2016-10-24 ENCOUNTER — Encounter: Payer: Self-pay | Admitting: *Deleted

## 2016-10-24 NOTE — Telephone Encounter (Signed)
Referral to gi placed. °

## 2016-10-24 NOTE — Progress Notes (Signed)
Letter Mailed/[facility] 04/03  

## 2016-11-20 ENCOUNTER — Other Ambulatory Visit (HOSPITAL_COMMUNITY)
Admission: RE | Admit: 2016-11-20 | Discharge: 2016-11-20 | Disposition: A | Discharge status: Home / Self Care | Payer: BLUE CROSS/BLUE SHIELD | Source: Ambulatory Visit | Attending: Family Medicine | Admitting: Family Medicine

## 2016-11-20 ENCOUNTER — Encounter: Payer: Self-pay | Admitting: Family Medicine

## 2016-11-20 ENCOUNTER — Ambulatory Visit (INDEPENDENT_AMBULATORY_CARE_PROVIDER_SITE_OTHER): Payer: BLUE CROSS/BLUE SHIELD | Admitting: Family Medicine

## 2016-11-20 VITALS — BP 124/70 | HR 82 | Temp 97.9°F | Resp 16 | Ht 67.0 in | Wt 270.4 lb

## 2016-11-20 DIAGNOSIS — K644 Residual hemorrhoidal skin tags: Secondary | ICD-10-CM | POA: Diagnosis present

## 2016-11-20 DIAGNOSIS — L918 Other hypertrophic disorders of the skin: Secondary | ICD-10-CM

## 2016-11-20 MED ORDER — VALACYCLOVIR HCL 1 G PO TABS: ORAL_TABLET | ORAL | 5 refills | Status: DC

## 2016-11-20 NOTE — Assessment & Plan Note (Signed)
Keep dry for 24 hour rto if any signs of infection-- fever, redness etc

## 2016-11-20 NOTE — Progress Notes (Signed)
Pre visit review using our clinic review tool, if applicable. No additional management support is needed unless otherwise documented below in the visit note. 

## 2016-11-20 NOTE — Progress Notes (Signed)
Subjective:  I acted as a Neurosurgeon for Dr. Delman Kitten, LPN    Patient ID: Jo Jackson, female    DOB: 03-26-1971, 46 y.o.   MRN: [id number]  Chief Complaint  Patient presents with  . Nevus    HPI  Patient is in today for skin tag removal. Pt report she has four 3 skin tags that she would like to remove today. Pt report she has two skin tags, one under each armpit, and one on left buttock.  Patient Care Team: [name], DO as PCP - General   Past Medical History:  Diagnosis Date  . Abnormal Pap smear    Repeats WNL  . ADD (attention deficit disorder)   . Anxiety   . Arthritis    Oseteoarthritis  . Depression   . GERD (gastroesophageal reflux disease)   . Headache(784.0)    Migraines  . Junctional escape rhythm   . Neuromuscular disorder (HCC)    Fibromyalgia  . Pregnancy induced hypertension   . Supraventricular tachycardia [facility])     Past Surgical History:  Procedure Laterality Date  . ABLATION OF DYSRHYTHMIC FOCUS    . APPENDECTOMY  1986  . CARPAL TUNNEL RELEASE  2000  . CERVICAL FUSION  08-24-99,04-26-00,05-09-01  . Knee sugery Left    16 yrs. old  . SHOULDER SURGERY  left-6-04, right 09-12-04  . tongue polyp  1984    Family History  Problem Relation Age of Onset  . Diabetes    . Thyroid disease    . Hypertension    . Depression    . Bipolar disorder    . Hodgkin's lymphoma Father   . COPD Mother   . Heart disease Mother     chf  . Cancer Paternal Aunt     breast    Social History   Social History  . Marital status: Married    Spouse name: N/A  . Number of children: 2  . Years of education: N/A   Occupational History  .  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 [employer]   Social History Main Topics  . Smoking status: Former Smoker    Quit date: 10/23/2007  . Smokeless tobacco: Never Used  . Alcohol use 0.0 oz/week     Comment: Occ  . Drug use: No  . Sexual activity: Yes   Other Topics Concern  . Not on file    Social History Narrative   G1p1    Outpatient Medications Prior to Visit  Medication Sig Dispense Refill  . amoxicillin-clavulanate (AUGMENTIN) 875-125 MG tablet Take 1 tablet by mouth 2 (two) times daily. 20 tablet 0  . amphetamine-dextroamphetamine (ADDERALL) 30 MG tablet Take 1 tablet by mouth 2 (two) times daily. 180 tablet 0  . diclofenac sodium (VOLTAREN) 1 % GEL Apply 2-4GM 3-4 times a day to affected area.  2  . HYDROcodone-acetaminophen (NORCO) 10-325 MG tablet Take 1 tablet by mouth every 6 (six) hours as needed. 30 tablet 0  . meloxicam (MOBIC) 15 MG tablet Take 1 tablet (15 mg total) by mouth daily. 90 tablet 0  . omeprazole (PRILOSEC) 40 MG capsule Take 1 capsule (40 mg total) by mouth 2 (two) times daily before a meal. 180 capsule 3  . ondansetron (ZOFRAN ODT) 8 MG disintegrating tablet Take 1 tablet (8 mg total) by mouth every 8 (eight) hours as needed for nausea or vomiting. 20 tablet 0  . ondansetron (ZOFRAN) 8 MG tablet Take 1  tablet (8 mg total) by mouth 3 (three) times daily as needed for nausea or vomiting. 10 tablet 0  . potassium chloride (K-DUR) 10 MEQ tablet Take 1 tablet (10 mEq total) by mouth daily. 5 tablet 0  . PROAIR HFA 108 (90 Base) MCG/ACT inhaler INHALE 2 PUFFS INTO LUNGS EVERY 6 HOURS AS NEEDED FOR WHEEZING OR SHORTNESS OF BREATH 8.5 Inhaler 2  . SUMAtriptan (IMITREX) 50 MG tablet TAKE 1 TABLET BY MOUTH ONCE, MAY REPEAT IN 2 HOURS IF HEADACHE PERSISTS OR RECURS 10 tablet 2  . tiZANidine (ZANAFLEX) 4 MG tablet Take 1 tablet (4 mg total) by mouth every 6 (six) hours as needed for muscle spasms. 30 tablet 0  . traMADol (ULTRAM) 50 MG tablet Take 1 tablet (50 mg total) by mouth every 8 (eight) hours as needed. 30 tablet 0  . Vilazodone HCl (VIIBRYD) 20 MG TABS Take 1 tablet (20 mg total) by mouth daily. 30 tablet 1  . valACYclovir (VALTREX) 1000 MG tablet TAKE 1 TABLET (1,000 MG TOTAL) BY MOUTH 3 (THREE) TIMES DAILY. 30 tablet 5   No facility-administered  medications prior to visit.     Allergies  Allergen Reactions  . Ketorolac Tromethamine Hives  . Morphine Other (See Comments)    "makes me the devil."  . Latex Rash and Other (See Comments)    Latex catheter caused extreme irritation    Review of Systems  Constitutional: Negative for fever.  HENT: Negative for congestion.   Eyes: Negative for blurred vision.  Respiratory: Negative for cough.   Cardiovascular: Negative for chest pain and palpitations.  Gastrointestinal: Negative for vomiting.  Musculoskeletal: Negative for back pain.  Skin: Negative for rash.  Neurological: Negative for loss of consciousness and headaches.       Objective:    Physical Exam  Constitutional: She is oriented to person, place, and time. She appears well-developed and well-nourished. No distress.  HENT:  Head: Normocephalic and atraumatic.  Eyes: Conjunctivae are normal. Pupils are equal, round, and reactive to light.  Neck: Normal range of motion. No thyromegaly present.  Cardiovascular: Normal rate and regular rhythm.   Pulmonary/Chest: Effort normal and breath sounds normal. She has no wheezes.  Abdominal: Soft. Bowel sounds are normal. There is no tenderness.  Musculoskeletal: Normal range of motion. She exhibits no edema or deformity.  Neurological: She is alert and oriented to person, place, and time.  Skin: Skin is warm and dry. She is not diaphoretic.     Psychiatric: She has a normal mood and affect.  Nursing note and vitals reviewed.   BP 124/70 (BP Location: Left Arm, Patient Position: Sitting, Cuff Size: Large)   Pulse 82   Temp 97.9 F (36.6 C) (Oral)   Resp 16   Ht  (1.702 m)   Wt 270 lb 6.4 oz (122.7 kg)   LMP 10/29/2016 (Approximate)   SpO2 99%   BMI 42.35 kg/m  Wt Readings from Last 3 Encounters:  11/20/16 270 lb 6.4 oz (122.7 kg)  10/18/16 274 lb (124.3 kg)  06/30/16 275 lb 3.2 oz (124.8 kg)   BP Readings from Last 3 Encounters:  11/20/16 124/70    10/18/16 (!) 123/93  06/30/16 131/89     Immunization History  Administered Date(s) Administered  . Influenza Whole 04/26/2009, 04/21/2010  . Influenza,inj,Quad PF,36+ Mos 04/23/2013, 04/22/2015, 05/11/2016    Health Maintenance  Topic Date Due  . PAP SMEAR  01/29/2016  . INFLUENZA VACCINE  02/21/2017  . TETANUS/TDAP  07/10/2022  .  HIV Screening  Completed    Lab Results  Component Value Date   WBC 4.6 10/19/2016   HGB 14.2 10/19/2016   HCT 40.4 10/19/2016   PLT 241.0 10/19/2016   GLUCOSE 110 (H) 10/19/2016   CHOL 203 (H) 04/10/2008   TRIG 56 04/10/2008   HDL 82 04/10/2008   LDLCALC 110 (H) 04/10/2008   ALT 19 10/19/2016   AST 20 10/19/2016   NA 136 10/19/2016   K 3.2 (L) 10/19/2016   CL 100 10/19/2016   CREATININE 0.70 10/19/2016   BUN 9 10/19/2016   CO2 31 10/19/2016   TSH 2.456 06/06/2013   HGBA1C 5.4 06/06/2013    Lab Results  Component Value Date   TSH 2.456 06/06/2013   Lab Results  Component Value Date   WBC 4.6 10/19/2016   HGB 14.2 10/19/2016   HCT 40.4 10/19/2016   MCV 83.4 10/19/2016   PLT 241.0 10/19/2016   Lab Results  Component Value Date   NA 136 10/19/2016   K 3.2 (L) 10/19/2016   CO2 31 10/19/2016   GLUCOSE 110 (H) 10/19/2016   BUN 9 10/19/2016   CREATININE 0.70 10/19/2016   BILITOT 0.6 10/19/2016   ALKPHOS 56 10/19/2016   AST 20 10/19/2016   ALT 19 10/19/2016   PROT 6.8 10/19/2016   ALBUMIN 3.9 10/19/2016   CALCIUM 8.9 10/19/2016   GFR 96.29 10/19/2016   Lab Results  Component Value Date   CHOL 203 (H) 04/10/2008   Lab Results  Component Value Date   HDL 82 04/10/2008   Lab Results  Component Value Date   LDLCALC 110 (H) 04/10/2008   Lab Results  Component Value Date   TRIG 56 04/10/2008   Lab Results  Component Value Date   CHOLHDL 2.5 Ratio 04/10/2008   Lab Results  Component Value Date   HGBA1C 5.4 06/06/2013         Assessment & Plan:   Problem List Items Addressed This Visit       Unprioritized   Skin tag of perianal region - Primary    Keep dry for 24 hour rto if any signs of infection-- fever, redness etc         I am having Ms. Chilson maintain her SUMAtriptan, meloxicam, omeprazole, diclofenac sodium, PROAIR HFA, amoxicillin-clavulanate, Vilazodone HCl, amphetamine-dextroamphetamine, tiZANidine, traMADol, HYDROcodone-acetaminophen, ondansetron, ondansetron, potassium chloride, and valACYclovir.  Meds ordered this encounter  Medications  . valACYclovir (VALTREX) 1000 MG tablet    Sig: TAKE 1 TABLET (1,000 MG TOTAL) BY MOUTH 3 (THREE) TIMES DAILY.    Dispense:  30 tablet    Refill:  5    CMA served as scribe during this visit. History, Physical and Plan performed by medical provider. Documentation and orders reviewed and attested to.  [name], DO   Patient ID: Jo Jackson, female   DOB: 03-22-1971, 46 y.o.   MRN: [id number]

## 2016-11-20 NOTE — Addendum Note (Signed)
Addended by: Vergie Living on: 11/20/2016 03:23 PM   Modules accepted: Orders

## 2016-11-20 NOTE — Patient Instructions (Signed)
Skin tag removal done Keep area dry for 24 hours then you can shower as usual

## 2016-12-07 ENCOUNTER — Encounter: Payer: Self-pay | Admitting: Family Medicine

## 2016-12-07 ENCOUNTER — Other Ambulatory Visit: Payer: Self-pay | Admitting: Family Medicine

## 2016-12-07 DIAGNOSIS — F988 Other specified behavioral and emotional disorders with onset usually occurring in childhood and adolescence: Secondary | ICD-10-CM

## 2016-12-07 DIAGNOSIS — M797 Fibromyalgia: Secondary | ICD-10-CM

## 2016-12-07 DIAGNOSIS — G43809 Other migraine, not intractable, without status migrainosus: Secondary | ICD-10-CM

## 2016-12-08 ENCOUNTER — Ambulatory Visit (INDEPENDENT_AMBULATORY_CARE_PROVIDER_SITE_OTHER): Payer: BLUE CROSS/BLUE SHIELD | Admitting: Family Medicine

## 2016-12-08 ENCOUNTER — Encounter: Payer: Self-pay | Admitting: Family Medicine

## 2016-12-08 ENCOUNTER — Other Ambulatory Visit: Payer: Self-pay | Admitting: Family Medicine

## 2016-12-08 VITALS — BP 126/76 | HR 75 | Temp 97.7°F | Resp 16 | Ht 67.0 in | Wt 273.4 lb

## 2016-12-08 DIAGNOSIS — M797 Fibromyalgia: Secondary | ICD-10-CM

## 2016-12-08 DIAGNOSIS — G43809 Other migraine, not intractable, without status migrainosus: Secondary | ICD-10-CM | POA: Diagnosis not present

## 2016-12-08 DIAGNOSIS — R319 Hematuria, unspecified: Secondary | ICD-10-CM

## 2016-12-08 DIAGNOSIS — R300 Dysuria: Secondary | ICD-10-CM | POA: Diagnosis not present

## 2016-12-08 DIAGNOSIS — F988 Other specified behavioral and emotional disorders with onset usually occurring in childhood and adolescence: Secondary | ICD-10-CM

## 2016-12-08 DIAGNOSIS — M545 Low back pain: Secondary | ICD-10-CM

## 2016-12-08 LAB — POC URINALSYSI DIPSTICK (AUTOMATED)
Bilirubin, UA: NEGATIVE
Glucose, UA: NEGATIVE
KETONES UA: NEGATIVE
LEUKOCYTES UA: NEGATIVE
Nitrite, UA: NEGATIVE
PH UA: 6 (ref 5.0–8.0)
PROTEIN UA: NEGATIVE
RBC UA: NEGATIVE
Spec Grav, UA: 1.015 (ref 1.010–1.025)
Urobilinogen, UA: 0.2 E.U./dL

## 2016-12-08 MED ORDER — FLUCONAZOLE 150 MG PO TABS: 150.0000 mg | ORAL_TABLET | Freq: Once | ORAL | 0 refills | Status: AC

## 2016-12-08 MED ORDER — TIZANIDINE HCL 4 MG PO TABS: 4.0000 mg | ORAL_TABLET | Freq: Four times a day (QID) | ORAL | 0 refills | Status: DC | PRN

## 2016-12-08 MED ORDER — CIPROFLOXACIN HCL 250 MG PO TABS: 250.0000 mg | ORAL_TABLET | Freq: Two times a day (BID) | ORAL | 0 refills | Status: DC

## 2016-12-08 MED ORDER — TRAMADOL HCL 50 MG PO TABS: 50.0000 mg | ORAL_TABLET | Freq: Three times a day (TID) | ORAL | 0 refills | Status: DC | PRN

## 2016-12-08 MED ORDER — HYDROCODONE-ACETAMINOPHEN 10-325 MG PO TABS: 1.0000 | ORAL_TABLET | Freq: Four times a day (QID) | ORAL | 0 refills | Status: DC | PRN

## 2016-12-08 MED ORDER — AMPHETAMINE-DEXTROAMPHETAMINE 30 MG PO TABS: 30.0000 mg | ORAL_TABLET | Freq: Two times a day (BID) | ORAL | 0 refills | Status: DC

## 2016-12-08 NOTE — Telephone Encounter (Signed)
She can come in at 4

## 2016-12-08 NOTE — Progress Notes (Signed)
Patient ID: Jo LoboMichele Jackson, female   DOB: 23-Apr-1971, 46 y.o.   MRN: [id number]     Subjective:  I acted as a Neurosurgeonscribe for Dr. Zola ButtonLowne-Chase.  [name], CMA   Patient ID: Jo LoboMichele Jackson, female    DOB: 23-Apr-1971, 46 y.o.   MRN: [id number]  Chief Complaint  Patient presents with  . Dysuria  . Hematuria    Dysuria   This is a new problem. Episode onset: one week. Associated symptoms include frequency and hematuria. Pertinent negatives include no flank pain or vomiting.  Hematuria  Irritative symptoms include frequency. Associated symptoms include dysuria. Pertinent negatives include no fever, flank pain or vomiting.    Patient is in today for dysuria and blood in urine.  Also would like medication refills on tramadol, hydrocodone, zanaflex, and adderall.  Patient Care Team: Zola ButtonLowne Chase, Grayling CongressYvonne R, DO as PCP - General   Past Medical History:  Diagnosis Date  . Abnormal Pap smear    Repeats WNL  . ADD (attention deficit disorder)   . Anxiety   . Arthritis    Oseteoarthritis  . Depression   . GERD (gastroesophageal reflux disease)   . Headache(784.0)    Migraines  . Junctional escape rhythm   . Neuromuscular disorder (HCC)    Fibromyalgia  . Pregnancy induced hypertension   . Supraventricular tachycardia [facility](HCC)     Past Surgical History:  Procedure Laterality Date  . ABLATION OF DYSRHYTHMIC FOCUS    . APPENDECTOMY  1986  . CARPAL TUNNEL RELEASE  2000  . CERVICAL FUSION  08-24-99,04-26-00,05-09-01  . Knee sugery Left    16 yrs. old  . SHOULDER SURGERY  left-6-04, right 09-12-04  . tongue polyp  1984    Family History  Problem Relation Age of Onset  . Diabetes Unknown   . Thyroid disease Unknown   . Hypertension Unknown   . Depression Unknown   . Bipolar disorder Unknown   . Hodgkin's lymphoma Father   . COPD Mother   . Heart disease Mother        chf  . Cancer Paternal Aunt        breast    Social History   Social History  . Marital status: Married   Spouse name: N/A  . Number of children: 2  . Years of education: N/A   Occupational History  .  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 [employer]   Social History Main Topics  . Smoking status: Former Smoker    Quit date: 10/23/2007  . Smokeless tobacco: Never Used  . Alcohol use 0.0 oz/week     Comment: Occ  . Drug use: No  . Sexual activity: Yes   Other Topics Concern  . Not on file   Social History Narrative   G1p1    Outpatient Medications Prior to Visit  Medication Sig Dispense Refill  . diclofenac sodium (VOLTAREN) 1 % GEL Apply 2-4GM 3-4 times a day to affected area.  2  . meloxicam (MOBIC) 15 MG tablet Take 1 tablet (15 mg total) by mouth daily. 90 tablet 0  . omeprazole (PRILOSEC) 40 MG capsule Take 1 capsule (40 mg total) by mouth 2 (two) times daily before a meal. 180 capsule 3  . ondansetron (ZOFRAN ODT) 8 MG disintegrating tablet Take 1 tablet (8 mg total) by mouth every 8 (eight) hours as needed for nausea or vomiting. 20 tablet 0  . potassium chloride (K-DUR) 10 MEQ tablet Take 1 tablet (10 mEq  total) by mouth daily. 5 tablet 0  . PROAIR HFA 108 (90 Base) MCG/ACT inhaler INHALE 2 PUFFS INTO LUNGS EVERY 6 HOURS AS NEEDED FOR WHEEZING OR SHORTNESS OF BREATH 8.5 Inhaler 2  . SUMAtriptan (IMITREX) 50 MG tablet TAKE 1 TABLET BY MOUTH ONCE, MAY REPEAT IN 2 HOURS IF HEADACHE PERSISTS OR RECURS 10 tablet 2  . valACYclovir (VALTREX) 1000 MG tablet TAKE 1 TABLET (1,000 MG TOTAL) BY MOUTH 3 (THREE) TIMES DAILY. 30 tablet 5  . Vilazodone HCl (VIIBRYD) 20 MG TABS Take 1 tablet (20 mg total) by mouth daily. 30 tablet 1  . amphetamine-dextroamphetamine (ADDERALL) 30 MG tablet Take 1 tablet by mouth 2 (two) times daily. 180 tablet 0  . HYDROcodone-acetaminophen (NORCO) 10-325 MG tablet Take 1 tablet by mouth every 6 (six) hours as needed. 30 tablet 0  . tiZANidine (ZANAFLEX) 4 MG tablet Take 1 tablet (4 mg total) by mouth every 6 (six) hours as needed for muscle spasms. 30 tablet 0   . traMADol (ULTRAM) 50 MG tablet Take 1 tablet (50 mg total) by mouth every 8 (eight) hours as needed. 30 tablet 0  . amoxicillin-clavulanate (AUGMENTIN) 875-125 MG tablet Take 1 tablet by mouth 2 (two) times daily. 20 tablet 0  . ondansetron (ZOFRAN) 8 MG tablet Take 1 tablet (8 mg total) by mouth 3 (three) times daily as needed for nausea or vomiting. 10 tablet 0   No facility-administered medications prior to visit.     Allergies  Allergen Reactions  . Ketorolac Tromethamine Hives  . Morphine Other (See Comments)    "makes me the devil."  . Latex Rash and Other (See Comments)    Latex catheter caused extreme irritation    Review of Systems  Constitutional: Negative for fever and malaise/fatigue.  HENT: Negative for congestion.   Eyes: Negative for blurred vision.  Respiratory: Negative for cough and shortness of breath.   Cardiovascular: Negative for chest pain, palpitations and leg swelling.  Gastrointestinal: Negative for vomiting.  Genitourinary: Positive for dysuria, frequency and hematuria. Negative for flank pain.  Musculoskeletal: Negative for back pain.  Skin: Negative for rash.  Neurological: Negative for loss of consciousness and headaches.       Objective:    Physical Exam  Constitutional: She is oriented to person, place, and time. She appears well-developed and well-nourished.  HENT:  Head: Normocephalic and atraumatic.  Eyes: Conjunctivae and EOM are normal.  Neck: Normal range of motion. Neck supple. No JVD present. Carotid bruit is not present. No thyromegaly present.  Cardiovascular: Normal rate, regular rhythm and normal heart sounds.   No murmur heard. Pulmonary/Chest: Effort normal and breath sounds normal. No respiratory distress. She has no wheezes. She has no rales. She exhibits no tenderness.  Abdominal: Soft. Bowel sounds are normal. She exhibits no distension and no mass. There is no tenderness. There is no rebound and no guarding.    Musculoskeletal: She exhibits no edema.  Neurological: She is alert and oriented to person, place, and time.  Psychiatric: She has a normal mood and affect.  Nursing note and vitals reviewed.   BP 126/76 (BP Location: Right Arm, Cuff Size: Large)   Pulse 75   Temp 97.7 F (36.5 C) (Oral)   Resp 16   Ht 5\' 7"  (1.702 m)   Wt 273 lb 6.4 oz (124 kg)   LMP 12/01/2016   SpO2 98%   BMI 42.82 kg/m  Wt Readings from Last 3 Encounters:  12/08/16 273 lb 6.4 oz (  124 kg)  11/20/16 270 lb 6.4 oz (122.7 kg)  10/18/16 274 lb (124.3 kg)   BP Readings from Last 3 Encounters:  12/08/16 126/76  11/20/16 124/70  10/18/16 (!) 123/93     Immunization History  Administered Date(s) Administered  . Influenza Whole 04/26/2009, 04/21/2010  . Influenza,inj,Quad PF,36+ Mos 04/23/2013, 04/22/2015, 05/11/2016    Health Maintenance  Topic Date Due  . PAP SMEAR  01/29/2016  . INFLUENZA VACCINE  02/21/2017  . TETANUS/TDAP  07/10/2022  . HIV Screening  Completed    Lab Results  Component Value Date   WBC 4.6 10/19/2016   HGB 14.2 10/19/2016   HCT 40.4 10/19/2016   PLT 241.0 10/19/2016   GLUCOSE 110 (H) 10/19/2016   CHOL 203 (H) 04/10/2008   TRIG 56 04/10/2008   HDL 82 04/10/2008   LDLCALC 110 (H) 04/10/2008   ALT 19 10/19/2016   AST 20 10/19/2016   NA 136 10/19/2016   K 3.2 (L) 10/19/2016   CL 100 10/19/2016   CREATININE 0.70 10/19/2016   BUN 9 10/19/2016   CO2 31 10/19/2016   TSH 2.456 06/06/2013   HGBA1C 5.4 06/06/2013    Lab Results  Component Value Date   TSH 2.456 06/06/2013   Lab Results  Component Value Date   WBC 4.6 10/19/2016   HGB 14.2 10/19/2016   HCT 40.4 10/19/2016   MCV 83.4 10/19/2016   PLT 241.0 10/19/2016   Lab Results  Component Value Date   NA 136 10/19/2016   K 3.2 (L) 10/19/2016   CO2 31 10/19/2016   GLUCOSE 110 (H) 10/19/2016   BUN 9 10/19/2016   CREATININE 0.70 10/19/2016   BILITOT 0.6 10/19/2016   ALKPHOS 56 10/19/2016   AST 20 10/19/2016    ALT 19 10/19/2016   PROT 6.8 10/19/2016   ALBUMIN 3.9 10/19/2016   CALCIUM 8.9 10/19/2016   GFR 96.29 10/19/2016   Lab Results  Component Value Date   CHOL 203 (H) 04/10/2008   Lab Results  Component Value Date   HDL 82 04/10/2008   Lab Results  Component Value Date   LDLCALC 110 (H) 04/10/2008   Lab Results  Component Value Date   TRIG 56 04/10/2008   Lab Results  Component Value Date   CHOLHDL 2.5 Ratio 04/10/2008   Lab Results  Component Value Date   HGBA1C 5.4 06/06/2013         Assessment & Plan:   Problem List Items Addressed This Visit      Unprioritized   Attention deficit disorder   Relevant Medications   amphetamine-dextroamphetamine (ADDERALL) 30 MG tablet    Other Visit Diagnoses    Dysuria    -  Primary   Relevant Medications   ciprofloxacin (CIPRO) 250 MG tablet   fluconazole (DIFLUCAN) 150 MG tablet   Other Relevant Orders   POCT Urinalysis Dipstick (Automated) (Completed)   Urine culture   Fibromyalgia       Relevant Medications   HYDROcodone-acetaminophen (NORCO) 10-325 MG tablet   Other migraine without status migrainosus, not intractable       Relevant Medications   tiZANidine (ZANAFLEX) 4 MG tablet   traMADol (ULTRAM) 50 MG tablet   HYDROcodone-acetaminophen (NORCO) 10-325 MG tablet      I have discontinued Ms. Blank's amoxicillin-clavulanate. I am also having her start on ciprofloxacin and fluconazole. Additionally, I am having her maintain her SUMAtriptan, meloxicam, omeprazole, diclofenac sodium, PROAIR HFA, Vilazodone HCl, ondansetron, potassium chloride, valACYclovir, tiZANidine, traMADol, HYDROcodone-acetaminophen, and amphetamine-dextroamphetamine.  Meds ordered this encounter  Medications  . tiZANidine (ZANAFLEX) 4 MG tablet    Sig: Take 1 tablet (4 mg total) by mouth every 6 (six) hours as needed for muscle spasms.    Dispense:  30 tablet    Refill:  0  . traMADol (ULTRAM) 50 MG tablet    Sig: Take 1 tablet  (50 mg total) by mouth every 8 (eight) hours as needed.    Dispense:  30 tablet    Refill:  0  . HYDROcodone-acetaminophen (NORCO) 10-325 MG tablet    Sig: Take 1 tablet by mouth every 6 (six) hours as needed.    Dispense:  30 tablet    Refill:  0  . amphetamine-dextroamphetamine (ADDERALL) 30 MG tablet    Sig: Take 1 tablet by mouth 2 (two) times daily.    Dispense:  180 tablet    Refill:  0  . ciprofloxacin (CIPRO) 250 MG tablet    Sig: Take 1 tablet (250 mg total) by mouth 2 (two) times daily.    Dispense:  6 tablet    Refill:  0  . fluconazole (DIFLUCAN) 150 MG tablet    Sig: Take 1 tablet (150 mg total) by mouth once.    Dispense:  1 tablet    Refill:  0    CMA served as scribe during this visit. History, Physical and Plan performed by medical provider. Documentation and orders reviewed and attested to.  [name], DO

## 2016-12-08 NOTE — Patient Instructions (Signed)

## 2016-12-11 LAB — URINE CULTURE

## 2016-12-19 ENCOUNTER — Encounter: Payer: Self-pay | Admitting: Family Medicine

## 2016-12-30 ENCOUNTER — Other Ambulatory Visit: Payer: Self-pay | Admitting: Family

## 2016-12-30 DIAGNOSIS — J208 Acute bronchitis due to other specified organisms: Secondary | ICD-10-CM

## 2017-01-26 ENCOUNTER — Other Ambulatory Visit: Payer: Self-pay | Admitting: Family Medicine

## 2017-02-01 ENCOUNTER — Encounter: Payer: Self-pay | Admitting: Family Medicine

## 2017-02-01 ENCOUNTER — Encounter: Payer: Self-pay | Admitting: *Deleted

## 2017-02-01 ENCOUNTER — Ambulatory Visit (INDEPENDENT_AMBULATORY_CARE_PROVIDER_SITE_OTHER): Payer: BLUE CROSS/BLUE SHIELD | Admitting: Family Medicine

## 2017-02-01 VITALS — BP 126/86 | HR 76 | Temp 98.0°F | Resp 16 | Ht 67.0 in | Wt 285.4 lb

## 2017-02-01 DIAGNOSIS — J0140 Acute pansinusitis, unspecified: Secondary | ICD-10-CM

## 2017-02-01 MED ORDER — AMOXICILLIN-POT CLAVULANATE 875-125 MG PO TABS: 1.0000 | ORAL_TABLET | Freq: Two times a day (BID) | ORAL | 0 refills | Status: DC

## 2017-02-01 NOTE — Assessment & Plan Note (Signed)
con't claritin and flonase abx ordered neti pot  rto prn

## 2017-02-01 NOTE — Progress Notes (Signed)
Patient ID: Jo Jackson, female   DOB: 05/25/1971, 46 y.o.   MRN: [id number]     Subjective:  I acted as a Neurosurgeon for Dr. Zola Button.  Apolonio Schneiders, CMA   Patient ID: Jo Jackson, female    DOB: 1971/01/03, 46 y.o.   MRN: [id number]  Chief Complaint  Patient presents with  . Sinusitis    Sinusitis  This is a new problem. Episode onset: thursday. There has been no fever. Associated symptoms include congestion, coughing, headaches, sinus pressure, sneezing and a sore throat. Pertinent negatives include no chills, hoarse voice, neck pain or swollen glands. (Ears popping ) Past treatments include oral decongestants and spray decongestants. The treatment provided mild relief.    Patient is in today for sinus pressure.  Patient Care Team: Zola Button, Grayling Congress, DO as PCP - General   Past Medical History:  Diagnosis Date  . Abnormal Pap smear    Repeats WNL  . ADD (attention deficit disorder)   . Anxiety   . Arthritis    Oseteoarthritis  . Depression   . GERD (gastroesophageal reflux disease)   . Headache(784.0)    Migraines  . Junctional escape rhythm   . Neuromuscular disorder (HCC)    Fibromyalgia  . Pregnancy induced hypertension   . Supraventricular tachycardia [facility])     Past Surgical History:  Procedure Laterality Date  . ABLATION OF DYSRHYTHMIC FOCUS    . APPENDECTOMY  1986  . CARPAL TUNNEL RELEASE  2000  . CERVICAL FUSION  08-24-99,04-26-00,05-09-01  . Knee sugery Left    16 yrs. old  . SHOULDER SURGERY  left-6-04, right 09-12-04  . tongue polyp  1984    Family History  Problem Relation Age of Onset  . Diabetes Unknown   . Thyroid disease Unknown   . Hypertension Unknown   . Depression Unknown   . Bipolar disorder Unknown   . Hodgkin's lymphoma Father   . COPD Mother   . Heart disease Mother        chf  . Cancer Paternal Aunt        breast    Social History   Social History  . Marital status: Married    Spouse name: N/A  . Number of  children: 2  . Years of education: N/A   Occupational History  .  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 [employer]   Social History Main Topics  . Smoking status: Former Smoker    Quit date: 10/23/2007  . Smokeless tobacco: Never Used  . Alcohol use 0.0 oz/week     Comment: Occ  . Drug use: No  . Sexual activity: Yes   Other Topics Concern  . Not on file   Social History Narrative   G1p1    Outpatient Medications Prior to Visit  Medication Sig Dispense Refill  . amphetamine-dextroamphetamine (ADDERALL) 30 MG tablet Take 1 tablet by mouth 2 (two) times daily. 180 tablet 0  . HYDROcodone-acetaminophen (NORCO) 10-325 MG tablet Take 1 tablet by mouth every 6 (six) hours as needed. 30 tablet 0  . ondansetron (ZOFRAN ODT) 8 MG disintegrating tablet Take 1 tablet (8 mg total) by mouth every 8 (eight) hours as needed for nausea or vomiting. 20 tablet 0  . PROAIR HFA 108 (90 Base) MCG/ACT inhaler INHALE 2 PUFFS INTO LUNGS EVERY 6 HOURS AS NEEDED FOR WHEEZING OR SHORTNESS OF BREATH 8.5 Inhaler 2  . SUMAtriptan (IMITREX) 50 MG tablet TAKE 1 TABLET BY MOUTH ONCE, MAY  REPEAT IN 2 HOURS IF HEADACHE PERSISTS OR RECURS 10 tablet 2  . tiZANidine (ZANAFLEX) 4 MG tablet Take 1 tablet (4 mg total) by mouth every 6 (six) hours as needed for muscle spasms. 30 tablet 0  . traMADol (ULTRAM) 50 MG tablet Take 1 tablet (50 mg total) by mouth every 8 (eight) hours as needed. 30 tablet 0  . valACYclovir (VALTREX) 1000 MG tablet TAKE 1 TABLET (1,000 MG TOTAL) BY MOUTH 3 (THREE) TIMES DAILY. 30 tablet 5  . VIIBRYD 20 MG TABS TAKE 1 TABLET (20 MG TOTAL) BY MOUTH DAILY. 90 tablet 1  . ciprofloxacin (CIPRO) 250 MG tablet Take 1 tablet (250 mg total) by mouth 2 (two) times daily. 6 tablet 0  . diclofenac sodium (VOLTAREN) 1 % GEL Apply 2-4GM 3-4 times a day to affected area.  2  . meloxicam (MOBIC) 15 MG tablet Take 1 tablet (15 mg total) by mouth daily. 90 tablet 0  . omeprazole (PRILOSEC) 40 MG capsule Take 1  capsule (40 mg total) by mouth 2 (two) times daily before a meal. 180 capsule 3  . potassium chloride (K-DUR) 10 MEQ tablet Take 1 tablet (10 mEq total) by mouth daily. 5 tablet 0  . Vilazodone HCl (VIIBRYD) 20 MG TABS Take 1 tablet (20 mg total) by mouth daily. 30 tablet 1   No facility-administered medications prior to visit.     Allergies  Allergen Reactions  . Ketorolac Tromethamine Hives  . Morphine Other (See Comments)    "makes me the devil."  . Latex Rash and Other (See Comments)    Latex catheter caused extreme irritation    Review of Systems  Constitutional: Negative for chills.  HENT: Positive for congestion, sinus pain, sinus pressure, sneezing and sore throat. Negative for hoarse voice.   Respiratory: Positive for cough. Negative for wheezing.   Musculoskeletal: Negative for neck pain.  Neurological: Positive for headaches.       Objective:    Physical Exam  Constitutional: She is oriented to person, place, and time. She appears well-developed and well-nourished.  HENT:  Right Ear: External ear normal. A middle ear effusion is present. Decreased hearing is noted.  Left Ear: External ear normal. A middle ear effusion is present. Decreased hearing is noted.  Nose: Right sinus exhibits maxillary sinus tenderness and frontal sinus tenderness. Left sinus exhibits maxillary sinus tenderness and frontal sinus tenderness.  Mouth/Throat: Posterior oropharyngeal erythema present.  + PND + errythema  Eyes: Conjunctivae are normal. Right eye exhibits no discharge. Left eye exhibits no discharge.  Cardiovascular: Normal rate, regular rhythm and normal heart sounds.   No murmur heard. Pulmonary/Chest: Effort normal and breath sounds normal. No respiratory distress. She has no wheezes. She has no rales. She exhibits no tenderness.  Musculoskeletal: She exhibits no edema.  Lymphadenopathy:    She has cervical adenopathy.  Neurological: She is alert and oriented to person,  place, and time.  Nursing note and vitals reviewed.   BP 126/86 (BP Location: Left Arm, Cuff Size: Large)   Pulse 76   Temp 98 F (36.7 C) (Oral)   Resp 16   Ht 5\' 7"  (1.702 m)   Wt 285 lb 6.4 oz (129.5 kg)   LMP 02/01/2017   SpO2 98%   BMI 44.70 kg/m  Wt Readings from Last 3 Encounters:  02/01/17 285 lb 6.4 oz (129.5 kg)  12/08/16 273 lb 6.4 oz (124 kg)  11/20/16 270 lb 6.4 oz (122.7 kg)   BP Readings from  Last 3 Encounters:  02/01/17 126/86  12/08/16 126/76  11/20/16 124/70     Immunization History  Administered Date(s) Administered  . Influenza Whole 04/26/2009, 04/21/2010  . Influenza,inj,Quad PF,36+ Mos 04/23/2013, 04/22/2015, 05/11/2016    Health Maintenance  Topic Date Due  . PAP SMEAR  01/29/2016  . INFLUENZA VACCINE  02/21/2017  . TETANUS/TDAP  07/10/2022  . HIV Screening  Completed    Lab Results  Component Value Date   WBC 4.6 10/19/2016   HGB 14.2 10/19/2016   HCT 40.4 10/19/2016   PLT 241.0 10/19/2016   GLUCOSE 110 (H) 10/19/2016   CHOL 203 (H) 04/10/2008   TRIG 56 04/10/2008   HDL 82 04/10/2008   LDLCALC 110 (H) 04/10/2008   ALT 19 10/19/2016   AST 20 10/19/2016   NA 136 10/19/2016   K 3.2 (L) 10/19/2016   CL 100 10/19/2016   CREATININE 0.70 10/19/2016   BUN 9 10/19/2016   CO2 31 10/19/2016   TSH 2.456 06/06/2013   HGBA1C 5.4 06/06/2013    Lab Results  Component Value Date   TSH 2.456 06/06/2013   Lab Results  Component Value Date   WBC 4.6 10/19/2016   HGB 14.2 10/19/2016   HCT 40.4 10/19/2016   MCV 83.4 10/19/2016   PLT 241.0 10/19/2016   Lab Results  Component Value Date   NA 136 10/19/2016   K 3.2 (L) 10/19/2016   CO2 31 10/19/2016   GLUCOSE 110 (H) 10/19/2016   BUN 9 10/19/2016   CREATININE 0.70 10/19/2016   BILITOT 0.6 10/19/2016   ALKPHOS 56 10/19/2016   AST 20 10/19/2016   ALT 19 10/19/2016   PROT 6.8 10/19/2016   ALBUMIN 3.9 10/19/2016   CALCIUM 8.9 10/19/2016   GFR 96.29 10/19/2016   Lab Results    Component Value Date   CHOL 203 (H) 04/10/2008   Lab Results  Component Value Date   HDL 82 04/10/2008   Lab Results  Component Value Date   LDLCALC 110 (H) 04/10/2008   Lab Results  Component Value Date   TRIG 56 04/10/2008   Lab Results  Component Value Date   CHOLHDL 2.5 Ratio 04/10/2008   Lab Results  Component Value Date   HGBA1C 5.4 06/06/2013         Assessment & Plan:   Problem List Items Addressed This Visit      Unprioritized   SINUSITIS - ACUTE-NOS - Primary    con't claritin and flonase abx ordered neti pot  rto prn      Relevant Medications   amoxicillin-clavulanate (AUGMENTIN) 875-125 MG tablet      I have discontinued Ms. Gaulin's meloxicam, omeprazole, diclofenac sodium, Vilazodone HCl, potassium chloride, and ciprofloxacin. I am also having her start on amoxicillin-clavulanate. Additionally, I am having her maintain her SUMAtriptan, ondansetron, valACYclovir, tiZANidine, traMADol, HYDROcodone-acetaminophen, amphetamine-dextroamphetamine, PROAIR HFA, and VIIBRYD.  Meds ordered this encounter  Medications  . amoxicillin-clavulanate (AUGMENTIN) 875-125 MG tablet    Sig: Take 1 tablet by mouth 2 (two) times daily.    Dispense:  20 tablet    Refill:  0    CMA served as scribe during this visit. History, Physical and Plan performed by medical provider. Documentation and orders reviewed and attested to.  [name], DO

## 2017-02-01 NOTE — Patient Instructions (Signed)

## 2017-02-05 ENCOUNTER — Telehealth: Payer: Self-pay | Admitting: Family Medicine

## 2017-02-05 NOTE — Telephone Encounter (Signed)
When she called gyn did she explain all this?   Is she still bleeding heavy? She will need ov if it does not slow down

## 2017-02-05 NOTE — Telephone Encounter (Signed)
ok 

## 2017-02-05 NOTE — Telephone Encounter (Signed)
Yes she did explain, but has eased off some, still having clots. Currently she has gone through only one large pad/currently has a moderate pad on. She has slowed down significantly today since saturday---per PCP to schedule tomorrow/or wait to see how she is tomorrow and then schedule.  She wants to wait and see how she is doing before scheduling. She does need a pap though and Physical (can only have a physical every 2 years). Will have a scheduler call to schedule that appt. I cannot find a CPE in the last 2 years if ok will schedule one with you/wants PAP too.

## 2017-02-05 NOTE — Telephone Encounter (Signed)
Caller name: Ronald LoboMichele Jackson Relationship to patient: self Can be reached: [phone]  Reason for call: Friday started heavy. On Saturday pt went thru 6 super plus tampons in 4 hours with quarter size clots. Not having pain associated. She is wearing tampon and pad and bleeding thru. Pt is asking for advice.   Pt said her current GYN Dr. Vincente PoliGrewal can't see her 03/16/17. Pt has not scheduled. Pt last saw him 4 years ago.

## 2017-02-12 ENCOUNTER — Other Ambulatory Visit (HOSPITAL_COMMUNITY)
Admission: RE | Admit: 2017-02-12 | Discharge: 2017-02-12 | Disposition: A | Discharge status: Home / Self Care | Payer: BLUE CROSS/BLUE SHIELD | Source: Ambulatory Visit | Attending: Family Medicine | Admitting: Family Medicine

## 2017-02-12 ENCOUNTER — Encounter: Payer: Self-pay | Admitting: Family Medicine

## 2017-02-12 ENCOUNTER — Ambulatory Visit (INDEPENDENT_AMBULATORY_CARE_PROVIDER_SITE_OTHER): Payer: BLUE CROSS/BLUE SHIELD | Admitting: Family Medicine

## 2017-02-12 VITALS — BP 120/82 | HR 68 | Temp 98.1°F | Resp 16 | Ht 67.0 in | Wt 282.6 lb

## 2017-02-12 DIAGNOSIS — Z124 Encounter for screening for malignant neoplasm of cervix: Secondary | ICD-10-CM | POA: Insufficient documentation

## 2017-02-12 DIAGNOSIS — N920 Excessive and frequent menstruation with regular cycle: Secondary | ICD-10-CM

## 2017-02-12 DIAGNOSIS — Z1231 Encounter for screening mammogram for malignant neoplasm of breast: Secondary | ICD-10-CM | POA: Diagnosis not present

## 2017-02-12 DIAGNOSIS — Z0000 Encounter for general adult medical examination without abnormal findings: Secondary | ICD-10-CM

## 2017-02-12 DIAGNOSIS — M797 Fibromyalgia: Secondary | ICD-10-CM

## 2017-02-12 DIAGNOSIS — Z1239 Encounter for other screening for malignant neoplasm of breast: Secondary | ICD-10-CM

## 2017-02-12 LAB — POC URINALSYSI DIPSTICK (AUTOMATED)
BILIRUBIN UA: NEGATIVE
Glucose, UA: NEGATIVE
KETONES UA: NEGATIVE
Leukocytes, UA: NEGATIVE
NITRITE UA: NEGATIVE
PH UA: 6 (ref 5.0–8.0)
Protein, UA: NEGATIVE
RBC UA: NEGATIVE
Spec Grav, UA: <=1.005 — AB (ref 1.010–1.025)
Urobilinogen, UA: 0.2 E.U./dL

## 2017-02-12 MED ORDER — ONDANSETRON 8 MG PO TBDP: 8.0000 mg | ORAL_TABLET | Freq: Three times a day (TID) | ORAL | 0 refills | Status: DC | PRN

## 2017-02-12 MED ORDER — HYDROCODONE-ACETAMINOPHEN 10-325 MG PO TABS: 1.0000 | ORAL_TABLET | Freq: Four times a day (QID) | ORAL | 0 refills | Status: DC | PRN

## 2017-02-12 NOTE — Progress Notes (Signed)
Subjective:   I acted as a Neurosurgeon for Dr. Zola Button.  Apolonio Schneiders, CMA   Jo Jackson is a 46 y.o. female and is here for a comprehensive physical exam. The patient reports problems with heavy periods and clots for last 2 periods   Social History   Social History  . Marital status: Married    Spouse name: N/A  . Number of children: 2  . Years of education: N/A   Occupational History  .  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 [employer]   Social History Main Topics  . Smoking status: Former Smoker    Quit date: 10/23/2007  . Smokeless tobacco: Never Used  . Alcohol use 0.0 oz/week     Comment: Occ  . Drug use: No  . Sexual activity: Yes   Other Topics Concern  . Not on file   Social History Narrative   G1p1      Exercise- no   Health Maintenance  Topic Date Due  . PAP SMEAR  01/29/2016  . INFLUENZA VACCINE  02/21/2017  . TETANUS/TDAP  07/10/2022  . HIV Screening  Completed    The following portions of the patient's history were reviewed and updated as appropriate:  She  has a past medical history of Abnormal Pap smear; ADD (attention deficit disorder); Anxiety; Arthritis; Depression; GERD (gastroesophageal reflux disease); Headache(784.0); Junctional escape rhythm; Neuromuscular disorder (HCC); Pregnancy induced hypertension; and Supraventricular tachycardia (HCC). She  does not have any pertinent problems on file. She  has a past surgical history that includes Appendectomy (1986); Carpal tunnel release (2000); Cervical fusion (08-24-99,04-26-00,05-09-01); Shoulder surgery (left-6-04, right 09-12-04); tongue polyp (1984); Ablation of dysrhythmic focus; and Knee sugery (Left). Her family history includes Bipolar disorder in her unknown relative; COPD in her mother; Cancer in her paternal aunt; Depression in her unknown relative; Diabetes in her unknown relative; Heart disease in her mother; Hodgkin's lymphoma in her father; Hypertension in her unknown relative; Thyroid  disease in her unknown relative. She  reports that she quit smoking about 9 years ago. She has never used smokeless tobacco. She reports that she drinks alcohol. She reports that she does not use drugs. She has a current medication list which includes the following prescription(s): amphetamine-dextroamphetamine, hydrocodone-acetaminophen, ondansetron, proair hfa, sumatriptan, tizanidine, tramadol, valacyclovir, and viibryd. Current Outpatient Prescriptions on File Prior to Visit  Medication Sig Dispense Refill  . amphetamine-dextroamphetamine (ADDERALL) 30 MG tablet Take 1 tablet by mouth 2 (two) times daily. 180 tablet 0  . PROAIR HFA 108 (90 Base) MCG/ACT inhaler INHALE 2 PUFFS INTO LUNGS EVERY 6 HOURS AS NEEDED FOR WHEEZING OR SHORTNESS OF BREATH 8.5 Inhaler 2  . SUMAtriptan (IMITREX) 50 MG tablet TAKE 1 TABLET BY MOUTH ONCE, MAY REPEAT IN 2 HOURS IF HEADACHE PERSISTS OR RECURS 10 tablet 2  . tiZANidine (ZANAFLEX) 4 MG tablet Take 1 tablet (4 mg total) by mouth every 6 (six) hours as needed for muscle spasms. 30 tablet 0  . traMADol (ULTRAM) 50 MG tablet Take 1 tablet (50 mg total) by mouth every 8 (eight) hours as needed. 30 tablet 0  . valACYclovir (VALTREX) 1000 MG tablet TAKE 1 TABLET (1,000 MG TOTAL) BY MOUTH 3 (THREE) TIMES DAILY. 30 tablet 5  . VIIBRYD 20 MG TABS TAKE 1 TABLET (20 MG TOTAL) BY MOUTH DAILY. 90 tablet 1   No current facility-administered medications on file prior to visit.    She is allergic to ketorolac tromethamine; morphine; and latex..  Review of  Systems Review of Systems  Constitutional: Negative for activity change, appetite change and fatigue.  HENT: Negative for hearing loss, congestion, tinnitus and ear discharge.  dentist [id number] Eyes: Negative for visual disturbance (see optho q1y -- vision corrected to 20/20 with glasses).  Respiratory: Negative for cough, chest tightness and shortness of breath.   Cardiovascular: Negative for chest pain, palpitations and leg  swelling.  Gastrointestinal: Negative for abdominal pain, diarrhea, constipation and abdominal distention.  Genitourinary: Negative for urgency, frequency, decreased urine volume and difficulty urinating.  Musculoskeletal: Negative for back pain, arthralgias and gait problem.  Skin: Negative for color change, pallor and rash.  Neurological: Negative for dizziness, light-headedness, numbness and headaches.  Hematological: Negative for adenopathy. Does not bruise/bleed easily.  Psychiatric/Behavioral: Negative for suicidal ideas, confusion, sleep disturbance, self-injury, dysphoric mood, decreased concentration and agitation.       Objective:    BP 120/82 (BP Location: Left Arm, Cuff Size: Large)   Pulse 68   Temp 98.1 F (36.7 C) (Oral)   Resp 16   Ht 5\' 7"  (1.702 m)   Wt 282 lb 9.6 oz (128.2 kg)   LMP 02/01/2017   SpO2 97%   BMI 44.26 kg/m  General appearance: alert, cooperative, appears stated age and no distress Head: Normocephalic, without obvious abnormality, atraumatic Eyes: conjunctivae/corneas clear. PERRL, EOM's intact. Fundi benign. Ears: normal TM's and external ear canals both ears Nose: Nares normal. Septum midline. Mucosa normal. No drainage or sinus tenderness. Throat: lips, mucosa, and tongue normal; teeth and gums normal Neck: no adenopathy, no carotid bruit, no JVD, supple, symmetrical, trachea midline and thyroid not enlarged, symmetric, no tenderness/mass/nodules Back: symmetric, no curvature. ROM normal. No CVA tenderness. Lungs: clear to auscultation bilaterally Breasts: normal appearance, no masses or tenderness Heart: regular rate and rhythm, S1, S2 normal, no murmur, click, rub or gallop Abdomen: soft, non-tender; bowel sounds normal; no masses,  no organomegaly Pelvic: cervix normal in appearance, external genitalia normal, no adnexal masses or tenderness, no cervical motion tenderness, rectovaginal septum normal, uterus normal size, shape, and  consistency, vagina normal without discharge and pap done , rectal heme neg brown stool Extremities: extremities normal, atraumatic, no cyanosis or edema Pulses: 2+ and symmetric Skin: Skin color, texture, turgor normal. No rashes or lesions Lymph nodes: Cervical, supraclavicular, and axillary nodes normal. Neurologic: Alert and oriented X 3, normal strength and tone. Normal symmetric reflexes. Normal coordination and gait    Assessment:    Healthy female exam.      Plan:    ghm utd  Check labs  See After Visit Summary for Counseling Recommendations     1. Fibromyalgia = - HYDROcodone-acetaminophen (NORCO) 10-325 MG tablet; Take 1 tablet by mouth every 6 (six) hours as needed.  Dispense: 30 tablet; Refill: 0  2. Preventative health care See ABove - POCT Urinalysis Dipstick (Automated) - CBC with Differential/Platelet - Comprehensive metabolic panel - Lipid panel - TSH  3. Menorrhagia with regular cycle F/u gyn - CBC with Differential/Platelet - TSH - T3, free - T4, free - US Pelvis Complete; Future - US Transvaginal Non-OB; Future - Ambulatory referral to Gynecology - Cytology - PAP  4. Breast cancer screening  - MM SCREENING BREAST TOMO BILATERAL; Future  5. Pap smear for cervical cancer screening  - Cytology - PAP

## 2017-02-12 NOTE — Patient Instructions (Signed)
Preventive Care 40-64 Years, Female Preventive care refers to lifestyle choices and visits with your health care provider that can promote health and wellness. What does preventive care include?  A yearly physical exam. This is also called an annual well check.  Dental exams once or twice a year.  Routine eye exams. Ask your health care provider how often you should have your eyes checked.  Personal lifestyle choices, including: ? Daily care of your teeth and gums. ? Regular physical activity. ? Eating a healthy diet. ? Avoiding tobacco and drug use. ? Limiting alcohol use. ? Practicing safe sex. ? Taking low-dose aspirin daily starting at age 58. ? Taking vitamin and mineral supplements as recommended by your health care provider. What happens during an annual well check? The services and screenings done by your health care provider during your annual well check will depend on your age, overall health, lifestyle risk factors, and family history of disease. Counseling Your health care provider may ask you questions about your:  Alcohol use.  Tobacco use.  Drug use.  Emotional well-being.  Home and relationship well-being.  Sexual activity.  Eating habits.  Work and work Statistician.  Method of birth control.  Menstrual cycle.  Pregnancy history.  Screening You may have the following tests or measurements:  Height, weight, and BMI.  Blood pressure.  Lipid and cholesterol levels. These may be checked every 5 years, or more frequently if you are over 81 years old.  Skin check.  Lung cancer screening. You may have this screening every year starting at age 78 if you have a 30-pack-year history of smoking and currently smoke or have quit within the past 15 years.  Fecal occult blood test (FOBT) of the stool. You may have this test every year starting at age 65.  Flexible sigmoidoscopy or colonoscopy. You may have a sigmoidoscopy every 5 years or a colonoscopy  every 10 years starting at age 30.  Hepatitis C blood test.  Hepatitis B blood test.  Sexually transmitted disease (STD) testing.  Diabetes screening. This is done by checking your blood sugar (glucose) after you have not eaten for a while (fasting). You may have this done every 1-3 years.  Mammogram. This may be done every 1-2 years. Talk to your health care provider about when you should start having regular mammograms. This may depend on whether you have a family history of breast cancer.  BRCA-related cancer screening. This may be done if you have a family history of breast, ovarian, tubal, or peritoneal cancers.  Pelvic exam and Pap test. This may be done every 3 years starting at age 80. Starting at age 36, this may be done every 5 years if you have a Pap test in combination with an HPV test.  Bone density scan. This is done to screen for osteoporosis. You may have this scan if you are at high risk for osteoporosis.  Discuss your test results, treatment options, and if necessary, the need for more tests with your health care provider. Vaccines Your health care provider may recommend certain vaccines, such as:  Influenza vaccine. This is recommended every year.  Tetanus, diphtheria, and acellular pertussis (Tdap, Td) vaccine. You may need a Td booster every 10 years.  Varicella vaccine. You may need this if you have not been vaccinated.  Zoster vaccine. You may need this after age 5.  Measles, mumps, and rubella (MMR) vaccine. You may need at least one dose of MMR if you were born in  1957 or later. You may also need a second dose.  Pneumococcal 13-valent conjugate (PCV13) vaccine. You may need this if you have certain conditions and were not previously vaccinated.  Pneumococcal polysaccharide (PPSV23) vaccine. You may need one or two doses if you smoke cigarettes or if you have certain conditions.  Meningococcal vaccine. You may need this if you have certain  conditions.  Hepatitis A vaccine. You may need this if you have certain conditions or if you travel or work in places where you may be exposed to hepatitis A.  Hepatitis B vaccine. You may need this if you have certain conditions or if you travel or work in places where you may be exposed to hepatitis B.  Haemophilus influenzae type b (Hib) vaccine. You may need this if you have certain conditions.  Talk to your health care provider about which screenings and vaccines you need and how often you need them. This information is not intended to replace advice given to you by your health care provider. Make sure you discuss any questions you have with your health care provider. Document Released: 08/06/2015 Document Revised: 03/29/2016 Document Reviewed: 05/11/2015 Elsevier Interactive Patient Education  2017 [employer].

## 2017-02-13 LAB — CBC WITH DIFFERENTIAL/PLATELET
BASOS ABS: 0 10*3/uL (ref 0.0–0.1)
Basophils Relative: 0.9 % (ref 0.0–3.0)
EOS ABS: 0.1 10*3/uL (ref 0.0–0.7)
Eosinophils Relative: 1.7 % (ref 0.0–5.0)
HEMATOCRIT: 41.1 % (ref 36.0–46.0)
Hemoglobin: 14 g/dL (ref 12.0–15.0)
LYMPHS PCT: 29.2 % (ref 12.0–46.0)
Lymphs Abs: 1.5 10*3/uL (ref 0.7–4.0)
MCHC: 34.1 g/dL (ref 30.0–36.0)
MCV: 87 fl (ref 78.0–100.0)
MONOS PCT: 7.5 % (ref 3.0–12.0)
Monocytes Absolute: 0.4 10*3/uL (ref 0.1–1.0)
NEUTROS PCT: 60.7 % (ref 43.0–77.0)
Neutro Abs: 3.2 10*3/uL (ref 1.4–7.7)
PLATELETS: 284 10*3/uL (ref 150.0–400.0)
RBC: 4.72 Mil/uL (ref 3.87–5.11)
RDW: 13.3 % (ref 11.5–15.5)
WBC: 5.2 10*3/uL (ref 4.0–10.5)

## 2017-02-13 LAB — LIPID PANEL
CHOL/HDL RATIO: 2
Cholesterol: 174 mg/dL (ref 0–200)
HDL: 72.2 mg/dL (ref >39.00)
LDL Cholesterol: 91 mg/dL (ref 0–99)
NONHDL: 102.2
Triglycerides: 58 mg/dL (ref 0.0–149.0)
VLDL: 11.6 mg/dL (ref 0.0–40.0)

## 2017-02-13 LAB — COMPREHENSIVE METABOLIC PANEL
ALK PHOS: 54 U/L (ref 39–117)
ALT: 14 U/L (ref 0–35)
AST: 17 U/L (ref 0–37)
Albumin: 4 g/dL (ref 3.5–5.2)
BILIRUBIN TOTAL: 0.6 mg/dL (ref 0.2–1.2)
BUN: 10 mg/dL (ref 6–23)
CALCIUM: 9.5 mg/dL (ref 8.4–10.5)
CO2: 32 meq/L (ref 19–32)
CREATININE: 0.85 mg/dL (ref 0.40–1.20)
Chloride: 102 mEq/L (ref 96–112)
GFR: 76.85 mL/min (ref >60.00)
GLUCOSE: 99 mg/dL (ref 70–99)
Potassium: 4.5 mEq/L (ref 3.5–5.1)
Sodium: 139 mEq/L (ref 135–145)
TOTAL PROTEIN: 6.9 g/dL (ref 6.0–8.3)

## 2017-02-13 LAB — T4, FREE: FREE T4: 0.95 ng/dL (ref 0.60–1.60)

## 2017-02-13 LAB — TSH: TSH: 1.98 u[IU]/mL (ref 0.35–4.50)

## 2017-02-13 LAB — T3, FREE: T3 FREE: 3 pg/mL (ref 2.3–4.2)

## 2017-02-14 LAB — CYTOLOGY - PAP
DIAGNOSIS: NEGATIVE
HPV: NOT DETECTED

## 2017-02-15 ENCOUNTER — Encounter: Payer: Self-pay | Admitting: Family Medicine

## 2017-02-20 ENCOUNTER — Ambulatory Visit (HOSPITAL_BASED_OUTPATIENT_CLINIC_OR_DEPARTMENT_OTHER)
Admission: RE | Admit: 2017-02-20 | Discharge: 2017-02-20 | Disposition: A | Discharge status: Home / Self Care | Payer: BLUE CROSS/BLUE SHIELD | Source: Ambulatory Visit | Attending: Family Medicine | Admitting: Family Medicine

## 2017-02-20 ENCOUNTER — Encounter (HOSPITAL_BASED_OUTPATIENT_CLINIC_OR_DEPARTMENT_OTHER): Payer: Self-pay

## 2017-02-20 DIAGNOSIS — N920 Excessive and frequent menstruation with regular cycle: Secondary | ICD-10-CM | POA: Diagnosis not present

## 2017-02-20 DIAGNOSIS — Z1239 Encounter for other screening for malignant neoplasm of breast: Secondary | ICD-10-CM

## 2017-02-20 DIAGNOSIS — Z1231 Encounter for screening mammogram for malignant neoplasm of breast: Secondary | ICD-10-CM | POA: Diagnosis not present

## 2017-03-29 ENCOUNTER — Other Ambulatory Visit: Payer: Self-pay | Admitting: Obstetrics and Gynecology

## 2017-03-30 ENCOUNTER — Encounter: Payer: Self-pay | Admitting: Family Medicine

## 2017-03-30 NOTE — Telephone Encounter (Signed)
I would discuss that with Dr Vincente PoliGrewal

## 2017-04-19 ENCOUNTER — Other Ambulatory Visit: Payer: Self-pay | Admitting: Family Medicine

## 2017-04-19 DIAGNOSIS — M797 Fibromyalgia: Secondary | ICD-10-CM

## 2017-04-19 DIAGNOSIS — G43809 Other migraine, not intractable, without status migrainosus: Secondary | ICD-10-CM

## 2017-04-19 DIAGNOSIS — F988 Other specified behavioral and emotional disorders with onset usually occurring in childhood and adolescence: Secondary | ICD-10-CM

## 2017-04-20 ENCOUNTER — Telehealth: Payer: Self-pay | Admitting: Family

## 2017-04-20 ENCOUNTER — Other Ambulatory Visit: Payer: Self-pay | Admitting: Family

## 2017-04-20 DIAGNOSIS — G43809 Other migraine, not intractable, without status migrainosus: Secondary | ICD-10-CM

## 2017-04-20 DIAGNOSIS — M797 Fibromyalgia: Secondary | ICD-10-CM

## 2017-04-20 DIAGNOSIS — F988 Other specified behavioral and emotional disorders with onset usually occurring in childhood and adolescence: Secondary | ICD-10-CM

## 2017-04-20 MED ORDER — HYDROCODONE-ACETAMINOPHEN 10-325 MG PO TABS: 1.0000 | ORAL_TABLET | Freq: Four times a day (QID) | ORAL | 0 refills | Status: DC | PRN

## 2017-04-20 MED ORDER — ONDANSETRON 8 MG PO TBDP: 8.0000 mg | ORAL_TABLET | Freq: Three times a day (TID) | ORAL | 0 refills | Status: DC | PRN

## 2017-04-20 MED ORDER — TRAMADOL HCL 50 MG PO TABS: 50.0000 mg | ORAL_TABLET | Freq: Three times a day (TID) | ORAL | 0 refills | Status: DC | PRN

## 2017-04-20 MED ORDER — TIZANIDINE HCL 4 MG PO TABS: 4.0000 mg | ORAL_TABLET | Freq: Four times a day (QID) | ORAL | 0 refills | Status: DC | PRN

## 2017-04-20 MED ORDER — AMPHETAMINE-DEXTROAMPHETAMINE 30 MG PO TABS: 30.0000 mg | ORAL_TABLET | Freq: Two times a day (BID) | ORAL | 0 refills | Status: DC

## 2017-04-20 NOTE — Telephone Encounter (Signed)
Please contact patient and let her know that her prescriptions are available for pickup. She will need to provide urine drug screen at time of pickup. (controlled substance registry was reviewed and appears consistent without aberencies)

## 2017-04-23 MED ORDER — ONDANSETRON 8 MG PO TBDP: 8.0000 mg | ORAL_TABLET | Freq: Three times a day (TID) | ORAL | 0 refills | Status: DC | PRN

## 2017-04-23 NOTE — Telephone Encounter (Signed)
Remove [facility] in [place] from her chart. She does not use [facility] at all. Please always sent to [facility] [address].  ZOFRAN ODT 8 MG generic was sent to [facility] in [place] . [facility]

## 2017-04-23 NOTE — Telephone Encounter (Signed)
[facility] has been removed from chart. Rx has been re[facility]sent to [facility]. As requested below. Rxs for: Norco, tramadol and adderall placed at front desk for pick up with note to collect UDS. Unable to leave message on voicemail as mailbox is full. Sent mychart message to pt.

## 2017-05-03 ENCOUNTER — Ambulatory Visit: Payer: Self-pay

## 2017-05-08 NOTE — Telephone Encounter (Signed)
She needs ov  If she feels like she is going to hurt herself or someone else she needs to go to ER or   [facility] in [place]

## 2017-05-10 ENCOUNTER — Ambulatory Visit (INDEPENDENT_AMBULATORY_CARE_PROVIDER_SITE_OTHER): Payer: BLUE CROSS/BLUE SHIELD | Admitting: Family Medicine

## 2017-05-10 ENCOUNTER — Encounter: Payer: Self-pay | Admitting: Family Medicine

## 2017-05-10 VITALS — BP 120/88 | HR 80 | Temp 98.7°F | Ht 66.0 in | Wt 282.0 lb

## 2017-05-10 DIAGNOSIS — Z23 Encounter for immunization: Secondary | ICD-10-CM | POA: Diagnosis not present

## 2017-05-10 DIAGNOSIS — F418 Other specified anxiety disorders: Secondary | ICD-10-CM | POA: Diagnosis not present

## 2017-05-10 DIAGNOSIS — Z0289 Encounter for other administrative examinations: Secondary | ICD-10-CM

## 2017-05-10 DIAGNOSIS — F988 Other specified behavioral and emotional disorders with onset usually occurring in childhood and adolescence: Secondary | ICD-10-CM

## 2017-05-10 DIAGNOSIS — M797 Fibromyalgia: Secondary | ICD-10-CM | POA: Diagnosis not present

## 2017-05-10 MED ORDER — AMPHETAMINE-DEXTROAMPHETAMINE 30 MG PO TABS: 30.0000 mg | ORAL_TABLET | Freq: Two times a day (BID) | ORAL | 0 refills | Status: DC

## 2017-05-10 MED ORDER — HYDROCODONE-ACETAMINOPHEN 10-325 MG PO TABS: 1.0000 | ORAL_TABLET | Freq: Four times a day (QID) | ORAL | 0 refills | Status: DC | PRN

## 2017-05-10 MED ORDER — VORTIOXETINE HBR 10 MG PO TABS: ORAL_TABLET | ORAL | 2 refills | Status: DC

## 2017-05-10 NOTE — Progress Notes (Signed)
Patient ID: Jo Jackson, female    DOB: 01-13-1971  Age: 46 y.o. MRN: [id number]    Subjective:  Subjective  HPI Jo Jackson presents for f/u add and depression.  She does not like the way the viibryd makes her feel.  Review of Systems  Constitutional: Negative for appetite change, diaphoresis, fatigue and unexpected weight change.  Eyes: Negative for pain, redness and visual disturbance.  Respiratory: Negative for cough, chest tightness, shortness of breath and wheezing.   Cardiovascular: Negative for chest pain, palpitations and leg swelling.  Endocrine: Negative for cold intolerance, heat intolerance, polydipsia, polyphagia and polyuria.  Genitourinary: Negative for difficulty urinating, dysuria and frequency.  Neurological: Negative for dizziness, light-headedness, numbness and headaches.    History Past Medical History:  Diagnosis Date  . Abnormal Pap smear    Repeats WNL  . ADD (attention deficit disorder)   . Anxiety   . Arthritis    Oseteoarthritis  . Depression   . GERD (gastroesophageal reflux disease)   . Headache(784.0)    Migraines  . Junctional escape rhythm   . Neuromuscular disorder (HCC)    Fibromyalgia  . Pregnancy induced hypertension   . Supraventricular tachycardia (HCC)     She has a past surgical history that includes Appendectomy (1986); Carpal tunnel release (2000); Cervical fusion (08-24-99,04-26-00,05-09-01); Shoulder surgery (left-6-04, right 09-12-04); tongue polyp (1984); Ablation of dysrhythmic focus; and Knee sugery (Left).   Her family history includes Bipolar disorder in her unknown relative; COPD in her mother; Cancer in her paternal aunt; Depression in her unknown relative; Diabetes in her unknown relative; Heart disease in her mother; Hodgkin's lymphoma in her father; Hypertension in her unknown relative; Thyroid disease in her unknown relative.She reports that she quit smoking about 9 years ago. She has never used smokeless  tobacco. She reports that she drinks alcohol. She reports that she does not use drugs.  Current Outpatient Prescriptions on File Prior to Visit  Medication Sig Dispense Refill  . ondansetron (ZOFRAN ODT) 8 MG disintegrating tablet Take 1 tablet (8 mg total) by mouth every 8 (eight) hours as needed for nausea or vomiting. 20 tablet 0  . PROAIR HFA 108 (90 Base) MCG/ACT inhaler INHALE 2 PUFFS INTO LUNGS EVERY 6 HOURS AS NEEDED FOR WHEEZING OR SHORTNESS OF BREATH 8.5 Inhaler 2  . SUMAtriptan (IMITREX) 50 MG tablet TAKE 1 TABLET BY MOUTH ONCE, MAY REPEAT IN 2 HOURS IF HEADACHE PERSISTS OR RECURS 10 tablet 2  . tiZANidine (ZANAFLEX) 4 MG tablet Take 1 tablet (4 mg total) by mouth every 6 (six) hours as needed for muscle spasms. 30 tablet 0  . traMADol (ULTRAM) 50 MG tablet Take 1 tablet (50 mg total) by mouth every 8 (eight) hours as needed. 30 tablet 0  . valACYclovir (VALTREX) 1000 MG tablet TAKE 1 TABLET (1,000 MG TOTAL) BY MOUTH 3 (THREE) TIMES DAILY. 30 tablet 5   No current facility-administered medications on file prior to visit.      Objective:  Objective  Physical Exam  Constitutional: She is oriented to person, place, and time. She appears well-developed and well-nourished.  HENT:  Head: Normocephalic and atraumatic.  Eyes: Conjunctivae and EOM are normal.  Neck: Normal range of motion. Neck supple. No JVD present. Carotid bruit is not present. No thyromegaly present.  Cardiovascular: Normal rate, regular rhythm and normal heart sounds.   No murmur heard. Pulmonary/Chest: Effort normal and breath sounds normal. No respiratory distress. She has no wheezes. She has no rales. She exhibits no  tenderness.  Musculoskeletal: She exhibits no edema.  Neurological: She is alert and oriented to person, place, and time.  Psychiatric: She has a normal mood and affect.   BP 120/88   Pulse 80   Temp 98.7 F (37.1 C) (Oral)   Ht 5\' 6"  (1.676 m)   Wt 282 lb (127.9 kg)   LMP 05/01/2017 (Exact  Date)   BMI 45.52 kg/m  Wt Readings from Last 3 Encounters:  05/10/17 282 lb (127.9 kg)  02/12/17 282 lb 9.6 oz (128.2 kg)  02/01/17 285 lb 6.4 oz (129.5 kg)     Lab Results  Component Value Date   WBC 5.2 02/12/2017   HGB 14.0 02/12/2017   HCT 41.1 02/12/2017   PLT 284.0 02/12/2017   GLUCOSE 99 02/12/2017   CHOL 174 02/12/2017   TRIG 58.0 02/12/2017   HDL 72.20 02/12/2017   LDLCALC 91 02/12/2017   ALT 14 02/12/2017   AST 17 02/12/2017   NA 139 02/12/2017   K 4.5 02/12/2017   CL 102 02/12/2017   CREATININE 0.85 02/12/2017   BUN 10 02/12/2017   CO2 32 02/12/2017   TSH 1.98 02/12/2017   HGBA1C 5.4 06/06/2013    US Transvaginal Non-ob  Result Date: 02/20/2017 CLINICAL DATA:  46 year old female with heavy cycles x3 months. LMP: 02/01/2017 EXAM: TRANSABDOMINAL AND TRANSVAGINAL ULTRASOUND OF PELVIS TECHNIQUE: Both transabdominal and transvaginal ultrasound examinations of the pelvis were performed. Transabdominal technique was performed for global imaging of the pelvis including uterus, ovaries, adnexal regions, and pelvic cul-de-sac. It was necessary to proceed with endovaginal exam following the transabdominal exam to visualize the endometrium and the ovaries. COMPARISON:  None FINDINGS: Uterus Measurements: 8.2 x 5.2 x 5.3 cm. No fibroids or other mass visualized. Endometrium Thickness: 18 mm. The thickness of the endometrium likely related to secretory phase of the menstrual cycle. No focal abnormality visualized. Right ovary Measurements: 3.4 x 2.8 x 2.2 cm. The ovaries grossly unremarkable as visualized. Left ovary Measurements: 3.0 x 1.6 x 2.0 cm. The left ovary is grossly unremarkable as visualized. Other findings No abnormal free fluid. IMPRESSION: Unremarkable pelvic ultrasound. Electronically Signed   By: Elgie Collard M.D.   On: 02/20/2017 19:11   US Pelvis Complete  Result Date: 02/20/2017 CLINICAL DATA:  46 year old female with heavy cycles x3 months. LMP: 02/01/2017  EXAM: TRANSABDOMINAL AND TRANSVAGINAL ULTRASOUND OF PELVIS TECHNIQUE: Both transabdominal and transvaginal ultrasound examinations of the pelvis were performed. Transabdominal technique was performed for global imaging of the pelvis including uterus, ovaries, adnexal regions, and pelvic cul-de-sac. It was necessary to proceed with endovaginal exam following the transabdominal exam to visualize the endometrium and the ovaries. COMPARISON:  None FINDINGS: Uterus Measurements: 8.2 x 5.2 x 5.3 cm. No fibroids or other mass visualized. Endometrium Thickness: 18 mm. The thickness of the endometrium likely related to secretory phase of the menstrual cycle. No focal abnormality visualized. Right ovary Measurements: 3.4 x 2.8 x 2.2 cm. The ovaries grossly unremarkable as visualized. Left ovary Measurements: 3.0 x 1.6 x 2.0 cm. The left ovary is grossly unremarkable as visualized. Other findings No abnormal free fluid. IMPRESSION: Unremarkable pelvic ultrasound. Electronically Signed   By: Elgie Collard M.D.   On: 02/20/2017 19:11   Mm Screening Breast Tomo Bilateral  Result Date: 02/21/2017 CLINICAL DATA:  Screening. EXAM: 2D DIGITAL SCREENING BILATERAL MAMMOGRAM WITH CAD AND ADJUNCT TOMO COMPARISON:  Previous exam(s). ACR Breast Density Category b: There are scattered areas of fibroglandular density. FINDINGS: There are no findings suspicious  for malignancy. Images were processed with CAD. IMPRESSION: No mammographic evidence of malignancy. A result letter of this screening mammogram will be mailed directly to the patient. RECOMMENDATION: Screening mammogram in one year. (Code:SM-B-01Y) BI-RADS CATEGORY  1: Negative. Electronically Signed   By: Bary RichardStan  Maynard M.D.   On: 02/21/2017 11:05     Assessment & Plan:  Plan  I have discontinued Ms. Oguin's VIIBRYD. I am also having her start on vortioxetine HBr. Additionally, I am having her maintain her SUMAtriptan, valACYclovir, PROAIR HFA, tiZANidine, traMADol,  ondansetron, HYDROcodone-acetaminophen, and amphetamine-dextroamphetamine.  Meds ordered this encounter  Medications  . vortioxetine HBr (TRINTELLIX) 10 MG TABS    Sig: 1 po qd    Dispense:  30 tablet    Refill:  2  . DISCONTD: HYDROcodone-acetaminophen (NORCO) 10-325 MG tablet    Sig: Take 1 tablet by mouth every 6 (six) hours as needed.    Dispense:  30 tablet    Refill:  0    Do not refill until May 20, 2017  . DISCONTD: amphetamine-dextroamphetamine (ADDERALL) 30 MG tablet    Sig: Take 1 tablet by mouth 2 (two) times daily.    Dispense:  180 tablet    Refill:  0    Do not fill until May 20, 2017  . HYDROcodone-acetaminophen (NORCO) 10-325 MG tablet    Sig: Take 1 tablet by mouth every 6 (six) hours as needed.    Dispense:  30 tablet    Refill:  0    Do not refill until Jun 20, 2017  . amphetamine-dextroamphetamine (ADDERALL) 30 MG tablet    Sig: Take 1 tablet by mouth 2 (two) times daily.    Dispense:  180 tablet    Refill:  0    Do not fill until Jun 20, 2017    Problem List Items Addressed This Visit      Unprioritized   Attention deficit disorder    Stable meds refilled      Relevant Medications   amphetamine-dextroamphetamine (ADDERALL) 30 MG tablet   Fibromyalgia    con't pain meds Data base reviewed       Relevant Medications   HYDROcodone-acetaminophen (NORCO) 10-325 MG tablet    Other Visit Diagnoses    Need for immunization against influenza    -  Primary   Relevant Orders   Flu Vaccine QUAD 6+ mos IM (Fluarix) (Completed)   Pain management contract agreement       Relevant Orders   Pain Mgmt, Profile 8 w/Conf, U   Depression with anxiety       Relevant Medications   vortioxetine HBr (TRINTELLIX) 10 MG TABS      Follow-up: Return in about 3 months (around 08/10/2017), or if symptoms worsen or fail to improve.  Donato SchultzYvonne R Lowne Chase, DO

## 2017-05-10 NOTE — Patient Instructions (Signed)

## 2017-05-11 NOTE — Assessment & Plan Note (Signed)
con't pain meds Database reviewed   

## 2017-05-11 NOTE — Assessment & Plan Note (Signed)
Stable   meds refilled

## 2017-05-14 LAB — PAIN MGMT, PROFILE 8 W/CONF, U
6 ACETYLMORPHINE: NEGATIVE ng/mL (ref <10)
AMPHETAMINE: 3936 ng/mL — AB (ref <250)
AMPHETAMINES: POSITIVE ng/mL — AB (ref <500)
Alcohol Metabolites: NEGATIVE ng/mL (ref <500)
BENZODIAZEPINES: NEGATIVE ng/mL (ref <100)
BUPRENORPHINE, URINE: NEGATIVE ng/mL (ref <5)
COCAINE METABOLITE: NEGATIVE ng/mL (ref <150)
Codeine: NEGATIVE ng/mL (ref <50)
Creatinine: 138 mg/dL
HYDROCODONE: NEGATIVE ng/mL (ref <50)
Hydromorphone: NEGATIVE ng/mL (ref <50)
MDMA: NEGATIVE ng/mL (ref <500)
Marijuana Metabolite: NEGATIVE ng/mL (ref <20)
Methamphetamine: NEGATIVE ng/mL (ref <250)
Morphine: NEGATIVE ng/mL (ref <50)
NORHYDROCODONE: NEGATIVE ng/mL (ref <50)
OPIATES: NEGATIVE ng/mL (ref <100)
OXIDANT: NEGATIVE ug/mL (ref <200)
Oxycodone: NEGATIVE ng/mL (ref <100)
pH: 6.94 (ref 4.5–9.0)

## 2017-05-28 ENCOUNTER — Ambulatory Visit: Payer: BLUE CROSS/BLUE SHIELD | Admitting: Family Medicine

## 2017-05-28 ENCOUNTER — Encounter: Payer: Self-pay | Admitting: Family Medicine

## 2017-05-28 VITALS — BP 132/87 | HR 70 | Temp 98.0°F | Resp 16 | Ht 66.0 in | Wt 278.4 lb

## 2017-05-28 DIAGNOSIS — F341 Dysthymic disorder: Secondary | ICD-10-CM | POA: Diagnosis not present

## 2017-05-28 DIAGNOSIS — F988 Other specified behavioral and emotional disorders with onset usually occurring in childhood and adolescence: Secondary | ICD-10-CM | POA: Diagnosis not present

## 2017-05-28 DIAGNOSIS — F320 Major depressive disorder, single episode, mild: Secondary | ICD-10-CM | POA: Diagnosis not present

## 2017-05-28 DIAGNOSIS — J069 Acute upper respiratory infection, unspecified: Secondary | ICD-10-CM | POA: Insufficient documentation

## 2017-05-28 MED ORDER — ONDANSETRON 8 MG PO TBDP: 8.0000 mg | ORAL_TABLET | Freq: Three times a day (TID) | ORAL | 0 refills | Status: DC | PRN

## 2017-05-28 MED ORDER — MOMETASONE FUROATE 50 MCG/ACT NA SUSP: 2.0000 | Freq: Every day | NASAL | 12 refills | Status: DC

## 2017-05-28 MED ORDER — TIZANIDINE HCL 4 MG PO TABS: 4.0000 mg | ORAL_TABLET | Freq: Four times a day (QID) | ORAL | 0 refills | Status: DC | PRN

## 2017-05-28 MED ORDER — VORTIOXETINE HBR 20 MG PO TABS: 20.0000 mg | ORAL_TABLET | Freq: Every day | ORAL | 1 refills | Status: DC

## 2017-05-28 NOTE — Assessment & Plan Note (Signed)
Doing better but still tired Inc dose trintellix to 20 mg daily rto 6 months

## 2017-05-28 NOTE — Progress Notes (Signed)
Patient ID: Jo Jackson, female   DOB: [birth year], 46 y.o.   MRN: [id number]     Subjective:  I acted as a Neurosurgeon for Dr. Zola Button.  Apolonio Schneiders, CMA   Patient ID: Jo Jackson, female    DOB: 1970/12/09, 46 y.o.   MRN: [id number]  Chief Complaint  Patient presents with  . Sinusitis  . Depression    Sinusitis  This is a new problem. There has been no fever. Associated symptoms include congestion, coughing, headaches, neck pain, sinus pressure and sneezing. Pertinent negatives include no chills, shortness of breath or sore throat. Past treatments include oral decongestants (zyrtec, claritin d). The treatment provided mild relief.    Patient is in today for sinus issues and follow up on depression medication. Sinus issues began about 2 days ago.  She has been taken robitussin DM, zyrtec, and claritin d.  She has been doing ok on depression medication but she is still very tired.-- pt has enough add med at home.  She is doing well with them.     Patient Care Team: Zola Button, Grayling Congress, DO as PCP - General   Past Medical History:  Diagnosis Date  . Abnormal Pap smear    Repeats WNL  . ADD (attention deficit disorder)   . Anxiety   . Arthritis    Oseteoarthritis  . Depression   . GERD (gastroesophageal reflux disease)   . Headache(784.0)    Migraines  . Junctional escape rhythm   . Neuromuscular disorder (HCC)    Fibromyalgia  . Pregnancy induced hypertension   . Supraventricular tachycardia [facility])     Past Surgical History:  Procedure Laterality Date  . ABLATION OF DYSRHYTHMIC FOCUS    . APPENDECTOMY  1986  . CARPAL TUNNEL RELEASE  2000  . CERVICAL FUSION  08-24-99,04-26-00,05-09-01  . Knee sugery Left    16 yrs. old  . SHOULDER SURGERY  left-6-04, right 09-12-04  . tongue polyp  1984    Family History  Problem Relation Age of Onset  . Hodgkin's lymphoma Father   . COPD Mother   . Heart disease Mother        chf  . Cancer Paternal Aunt        breast    . Diabetes Unknown   . Thyroid disease Unknown   . Hypertension Unknown   . Depression Unknown   . Bipolar disorder Unknown     Social History   Socioeconomic History  . Marital status: Married    Spouse name: Not on file  . Number of children: 2  . Years of education: Not on file  . Highest education level: Not on file  Social Needs  . Financial resource strain: Not on file  . Food insecurity - worry: Not on file  . Food insecurity - inability: Not on file  . Transportation needs - medical: Not on file  . Transportation needs - non-medical: Not on file  Occupational History    Employer: [employer]    Comment: P/T  . 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    Employer: [employer]  Tobacco Use  . Smoking status: Former Smoker    Last attempt to quit: 10/23/2007    Years since quitting: 9.6  . Smokeless tobacco: Never Used  Substance and Sexual Activity  . Alcohol use: Yes    Alcohol/week: 0.0 oz    Comment: Occ  . Drug use: No  . Sexual activity: Yes  Other Topics Concern  . Not on file  Social  History Narrative   G1p1      Exercise- no    Outpatient Medications Prior to Visit  Medication Sig Dispense Refill  . amphetamine-dextroamphetamine (ADDERALL) 30 MG tablet Take 1 tablet by mouth 2 (two) times daily. 180 tablet 0  . HYDROcodone-acetaminophen (NORCO) 10-325 MG tablet Take 1 tablet by mouth every 6 (six) hours as needed. 30 tablet 0  . PROAIR HFA 108 (90 Base) MCG/ACT inhaler INHALE 2 PUFFS INTO LUNGS EVERY 6 HOURS AS NEEDED FOR WHEEZING OR SHORTNESS OF BREATH 8.5 Inhaler 2  . SUMAtriptan (IMITREX) 50 MG tablet TAKE 1 TABLET BY MOUTH ONCE, MAY REPEAT IN 2 HOURS IF HEADACHE PERSISTS OR RECURS 10 tablet 2  . traMADol (ULTRAM) 50 MG tablet Take 1 tablet (50 mg total) by mouth every 8 (eight) hours as needed. 30 tablet 0  . valACYclovir (VALTREX) 1000 MG tablet TAKE 1 TABLET (1,000 MG TOTAL) BY MOUTH 3 (THREE) TIMES DAILY. 30 tablet 5  . vortioxetine HBr (TRINTELLIX) 10 MG  TABS 1 po qd 30 tablet 2  . ondansetron (ZOFRAN ODT) 8 MG disintegrating tablet Take 1 tablet (8 mg total) by mouth every 8 (eight) hours as needed for nausea or vomiting. 20 tablet 0  . tiZANidine (ZANAFLEX) 4 MG tablet Take 1 tablet (4 mg total) by mouth every 6 (six) hours as needed for muscle spasms. 30 tablet 0   No facility-administered medications prior to visit.     Allergies  Allergen Reactions  . Ketorolac Tromethamine Hives  . Morphine Other (See Comments)    "makes me the devil."  . Latex Rash and Other (See Comments)    Latex catheter caused extreme irritation    Review of Systems  Constitutional: Negative for chills, fever and malaise/fatigue.  HENT: Positive for congestion, sinus pressure and sneezing. Negative for hearing loss and sore throat.   Eyes: Negative for discharge.  Respiratory: Positive for cough. Negative for sputum production and shortness of breath.   Cardiovascular: Negative for chest pain, palpitations and leg swelling.  Gastrointestinal: Negative for abdominal pain, blood in stool, constipation, diarrhea, heartburn, nausea and vomiting.  Genitourinary: Negative for dysuria, frequency, hematuria and urgency.  Musculoskeletal: Positive for neck pain. Negative for back pain, falls and myalgias.  Skin: Negative for rash.  Neurological: Positive for headaches. Negative for dizziness, sensory change, loss of consciousness and weakness.  Endo/Heme/Allergies: Negative for environmental allergies. Does not bruise/bleed easily.  Psychiatric/Behavioral: Negative for depression and suicidal ideas. The patient is not nervous/anxious and does not have insomnia.        Objective:    Physical Exam  Constitutional: She is oriented to person, place, and time. She appears well-developed and well-nourished.  HENT:  Right Ear: External ear normal.  Left Ear: External ear normal.  + PND + errythema  Eyes: Conjunctivae are normal. Right eye exhibits no discharge.  Left eye exhibits no discharge.  Cardiovascular: Normal rate, regular rhythm and normal heart sounds.  No murmur heard. Pulmonary/Chest: Effort normal and breath sounds normal. No respiratory distress. She has no wheezes. She has no rales. She exhibits no tenderness.  Musculoskeletal: She exhibits no edema.  Lymphadenopathy:    She has cervical adenopathy.  Neurological: She is alert and oriented to person, place, and time.  Psychiatric: Her behavior is normal. Judgment and thought content normal. Her mood appears not anxious. Her affect is not angry. She exhibits a depressed mood.  Doing better but is still tired  Nursing note and vitals reviewed.   BP  132/87 (BP Location: Right Arm, Cuff Size: Large)   Pulse 70   Temp 98 F (36.7 C) (Oral)   Resp 16   Ht 5\' 6"  (1.676 m)   Wt 278 lb 6.4 oz (126.3 kg)   LMP 05/01/2017 (Exact Date)   SpO2 100%   BMI 44.93 kg/m  Wt Readings from Last 3 Encounters:  05/28/17 278 lb 6.4 oz (126.3 kg)  05/10/17 282 lb (127.9 kg)  02/12/17 282 lb 9.6 oz (128.2 kg)   BP Readings from Last 3 Encounters:  05/28/17 132/87  05/10/17 120/88  02/12/17 120/82     Immunization History  Administered Date(s) Administered  . Influenza Whole 04/26/2009, 04/21/2010  . Influenza,inj,Quad PF,6+ Mos 04/23/2013, 04/22/2015, 05/11/2016, 05/10/2017    Health Maintenance  Topic Date Due  . PAP SMEAR  02/13/2020  . TETANUS/TDAP  07/10/2022  . INFLUENZA VACCINE  Completed  . HIV Screening  Completed    Lab Results  Component Value Date   WBC 5.2 02/12/2017   HGB 14.0 02/12/2017   HCT 41.1 02/12/2017   PLT 284.0 02/12/2017   GLUCOSE 99 02/12/2017   CHOL 174 02/12/2017   TRIG 58.0 02/12/2017   HDL 72.20 02/12/2017   LDLCALC 91 02/12/2017   ALT 14 02/12/2017   AST 17 02/12/2017   NA 139 02/12/2017   K 4.5 02/12/2017   CL 102 02/12/2017   CREATININE 0.85 02/12/2017   BUN 10 02/12/2017   CO2 32 02/12/2017   TSH 1.98 02/12/2017   HGBA1C 5.4  06/06/2013    Lab Results  Component Value Date   TSH 1.98 02/12/2017   Lab Results  Component Value Date   WBC 5.2 02/12/2017   HGB 14.0 02/12/2017   HCT 41.1 02/12/2017   MCV 87.0 02/12/2017   PLT 284.0 02/12/2017   Lab Results  Component Value Date   NA 139 02/12/2017   K 4.5 02/12/2017   CO2 32 02/12/2017   GLUCOSE 99 02/12/2017   BUN 10 02/12/2017   CREATININE 0.85 02/12/2017   BILITOT 0.6 02/12/2017   ALKPHOS 54 02/12/2017   AST 17 02/12/2017   ALT 14 02/12/2017   PROT 6.9 02/12/2017   ALBUMIN 4.0 02/12/2017   CALCIUM 9.5 02/12/2017   GFR 76.85 02/12/2017   Lab Results  Component Value Date   CHOL 174 02/12/2017   Lab Results  Component Value Date   HDL 72.20 02/12/2017   Lab Results  Component Value Date   LDLCALC 91 02/12/2017   Lab Results  Component Value Date   TRIG 58.0 02/12/2017   Lab Results  Component Value Date   CHOLHDL 2 02/12/2017   Lab Results  Component Value Date   HGBA1C 5.4 06/06/2013         Assessment & Plan:   Problem List Items Addressed This Visit      Unprioritized   ANXIETY DEPRESSION    Doing better but still tired Inc dose trintellix to 20 mg daily rto 6 months      Relevant Medications   vortioxetine HBr (TRINTELLIX) 20 MG TABS   Attention deficit disorder    Stable on meds      Viral upper respiratory tract infection    nasonex Zyrtec  Delsym,  mucinex Do not use claritin D       Relevant Medications   mometasone (NASONEX) 50 MCG/ACT nasal spray    Other Visit Diagnoses    Depression, major, single episode, mild (HCC)    -  Primary  Relevant Medications   vortioxetine HBr (TRINTELLIX) 20 MG TABS      I have changed Maleta Smet's tiZANidine and ondansetron. I am also having her start on vortioxetine HBr and mometasone. Additionally, I am having her maintain her SUMAtriptan, valACYclovir, PROAIR HFA, traMADol, vortioxetine HBr, HYDROcodone-acetaminophen, and  amphetamine-dextroamphetamine.  Meds ordered this encounter  Medications  . vortioxetine HBr (TRINTELLIX) 20 MG TABS    Sig: Take 20 mg daily by mouth.    Dispense:  90 tablet    Refill:  1  . tiZANidine (ZANAFLEX) 4 MG tablet    Sig: Take 1 tablet (4 mg total) every 6 (six) hours as needed by mouth for muscle spasms.    Dispense:  30 tablet    Refill:  0  . ondansetron (ZOFRAN ODT) 8 MG disintegrating tablet    Sig: Take 1 tablet (8 mg total) every 8 (eight) hours as needed by mouth for nausea or vomiting.    Dispense:  20 tablet    Refill:  0  . mometasone (NASONEX) 50 MCG/ACT nasal spray    Sig: Place 2 sprays daily into the nose.    Dispense:  17 g    Refill:  [id number]     Yvonne R Lowne Chase, DO

## 2017-05-28 NOTE — Assessment & Plan Note (Signed)
Stable on meds  

## 2017-05-28 NOTE — Patient Instructions (Signed)
Upper Respiratory Infection, Adult Most upper respiratory infections (URIs) are caused by a virus. A URI affects the nose, throat, and upper air passages. The most common type of URI is often called "the common cold." Follow these instructions at home:  Take medicines only as told by your doctor.  Gargle warm saltwater or take cough drops to comfort your throat as told by your doctor.  Use a warm mist humidifier or inhale steam from a shower to increase air moisture. This may make it easier to breathe.  Drink enough fluid to keep your pee (urine) clear or pale yellow.  Eat soups and other clear broths.  Have a healthy diet.  Rest as needed.  Go back to work when your fever is gone or your doctor says it is okay. ? You may need to stay home longer to avoid giving your URI to others. ? You can also wear a face mask and wash your hands often to prevent spread of the virus.  Use your inhaler more if you have asthma.  Do not use any tobacco products, including cigarettes, chewing tobacco, or electronic cigarettes. If you need help quitting, ask your doctor. Contact a doctor if:  You are getting worse, not better.  Your symptoms are not helped by medicine.  You have chills.  You are getting more short of breath.  You have brown or red mucus.  You have yellow or brown discharge from your nose.  You have pain in your face, especially when you bend forward.  You have a fever.  You have puffy (swollen) neck glands.  You have pain while swallowing.  You have white areas in the back of your throat. Get help right away if:  You have very bad or constant: ? Headache. ? Ear pain. ? Pain in your forehead, behind your eyes, and over your cheekbones (sinus pain). ? Chest pain.  You have long-lasting (chronic) lung disease and any of the following: ? Wheezing. ? Long-lasting cough. ? Coughing up blood. ? A change in your usual mucus.  You have a stiff neck.  You have  changes in your: ? Vision. ? Hearing. ? Thinking. ? Mood. This information is not intended to replace advice given to you by your health care provider. Make sure you discuss any questions you have with your health care provider. Document Released: 12/27/2007 Document Revised: 03/12/2016 Document Reviewed: 10/15/2013 Elsevier Interactive Patient Education  2018 [employer] Inc[employer]  

## 2017-05-28 NOTE — Assessment & Plan Note (Signed)
nasonex Zyrtec  Delsym,  mucinex Do not use claritin D

## 2017-06-29 ENCOUNTER — Telehealth: Payer: Self-pay | Admitting: Family Medicine

## 2017-06-29 NOTE — Telephone Encounter (Signed)
[facility]. Rx has been shredded.

## 2017-07-09 ENCOUNTER — Ambulatory Visit: Payer: Self-pay | Admitting: Family Medicine

## 2017-07-23 ENCOUNTER — Ambulatory Visit: Payer: BLUE CROSS/BLUE SHIELD | Admitting: Family Medicine

## 2017-07-23 ENCOUNTER — Telehealth: Payer: Self-pay | Admitting: Family Medicine

## 2017-07-23 NOTE — Telephone Encounter (Signed)
Copied from CRM [phone]#[id number]. Topic: Quick Communication - Appointment Cancellation >> Jul 23, 2017  8:19 AM Guinevere FerrariMorris, Datron Brakebill E, NT wrote: Patient called to cancel appointment scheduled for today at 9 am with Dr. Laury AxonLowne. Pt said she doesn't have transportation and is going to reschedule    Route to department's PEC pool.

## 2017-07-27 ENCOUNTER — Encounter: Payer: Self-pay | Admitting: Family Medicine

## 2017-07-27 ENCOUNTER — Ambulatory Visit: Payer: BLUE CROSS/BLUE SHIELD | Admitting: Family Medicine

## 2017-07-27 VITALS — BP 122/80 | HR 73 | Temp 97.9°F | Ht 67.0 in | Wt 273.0 lb

## 2017-07-27 DIAGNOSIS — M797 Fibromyalgia: Secondary | ICD-10-CM | POA: Diagnosis not present

## 2017-07-27 DIAGNOSIS — M62838 Other muscle spasm: Secondary | ICD-10-CM | POA: Diagnosis not present

## 2017-07-27 DIAGNOSIS — R110 Nausea: Secondary | ICD-10-CM

## 2017-07-27 DIAGNOSIS — R51 Headache: Secondary | ICD-10-CM | POA: Diagnosis not present

## 2017-07-27 DIAGNOSIS — M1990 Unspecified osteoarthritis, unspecified site: Secondary | ICD-10-CM | POA: Diagnosis not present

## 2017-07-27 DIAGNOSIS — F341 Dysthymic disorder: Secondary | ICD-10-CM

## 2017-07-27 DIAGNOSIS — G4486 Cervicogenic headache: Secondary | ICD-10-CM

## 2017-07-27 DIAGNOSIS — F988 Other specified behavioral and emotional disorders with onset usually occurring in childhood and adolescence: Secondary | ICD-10-CM

## 2017-07-27 MED ORDER — DICLOFENAC SODIUM 2 % TD SOLN: TRANSDERMAL | 5 refills | Status: DC

## 2017-07-27 MED ORDER — TIZANIDINE HCL 4 MG PO TABS: 4.0000 mg | ORAL_TABLET | Freq: Four times a day (QID) | ORAL | 0 refills | Status: DC | PRN

## 2017-07-27 MED ORDER — ONDANSETRON 8 MG PO TBDP: 8.0000 mg | ORAL_TABLET | Freq: Three times a day (TID) | ORAL | 0 refills | Status: DC | PRN

## 2017-07-27 NOTE — Patient Instructions (Signed)
Depression Screening Depression screening is a tool that your health care provider can use to learn if you have symptoms of depression. Depression is a common condition with many symptoms that are also often found in other conditions. Depression is treatable, but it must first be diagnosed. You may not know that certain feelings, thoughts, and behaviors that you are having can be symptoms of depression. Taking a depression screening test can help you and your health care provider decide if you need more assessment, or if you should be referred to a mental health care provider. What are the screening tests?  You may have a physical exam to see if another condition is affecting your mental health. You may have a blood or urine sample taken during the physical exam.  You may be interviewed using a screening tool that was developed from research, such as one of these: ? Patient Health Questionnaire (PHQ). This is a set of either 2 or 9 questions. A health care provider who has been trained to score this screening test uses a guide to assess if your symptoms suggest that you may have depression. ? Hamilton Depression Rating Scale (HAM-D). This is a set of either 17 or 24 questions. You may be asked to take it again during or after your treatment, to see if your depression has gotten better. ? Beck Depression Inventory (BDI). This is a set of 21 multiple choice questions. Your health care provider scores your answers to assess:  Your level of depression, ranging from mild to severe.  Your response to treatment.  Your health care provider may talk with you about your daily activities, such as eating, sleeping, work, and recreation, and ask if you have had any changes in activity.  Your health care provider may ask you to see a mental health specialist, such as a psychiatrist or psychologist, for more evaluation. Who should be screened for depression?  All adults, including adults with a family history  of a mental health disorder.  Adolescents who are 12-18 years old.  People who are recovering from a myocardial infarction (MI).  Pregnant women, or women who have given birth.  People who have a long-term (chronic) illness.  Anyone who has been diagnosed with another type of a mental health disorder.  Anyone who has symptoms that could show depression. What do my results mean? Your health care provider will review the results of your depression screening, physical exam, and lab tests. Positive screens suggest that you may have depression. Screening is the first step in getting the care that you may need. It is up to you to get your screening results. Ask your health care provider, or the department that is doing your screening tests, when your results will be ready. Talk with your health care provider about your results and diagnosis. A diagnosis of depression is made using the Diagnostic and Statistical Manual of Mental Disorders (DSM-V). This is a book that lists the number and type of symptoms that must be present for a health care provider to give a specific diagnosis.  Your health care provider may work with you to treat your symptoms of depression, or your health care provider may help you find a mental health provider who can assess, diagnose, and treat your depression. Get help right away if:  You have thoughts about hurting yourself or others. If you ever feel like you may hurt yourself or others, or have thoughts about taking your own life, get help right away. You can   go to your nearest emergency department or call:  Your local emergency services (911 in the [place]  A suicide crisis helpline, such as the National Suicide Prevention Lifeline at 1-800-273-8255. This is open 24 hours a day.  Summary  Depression screening is the first step in getting the help that you may need.  If your screening test shows symptoms of depression (is positive), your health care provider may ask  you to see a mental health provider.  Anyone who is age 12 or older should be screened for depression. This information is not intended to replace advice given to you by your health care provider. Make sure you discuss any questions you have with your health care provider. Document Released: 11/24/2016 Document Revised: 11/24/2016 Document Reviewed: 11/24/2016 Elsevier Interactive Patient Education  2018 Elsevier Inc.  

## 2017-07-27 NOTE — Assessment & Plan Note (Signed)
Stable  con't prn meds 

## 2017-07-27 NOTE — Progress Notes (Signed)
Subjective:  I acted as a Neurosurgeonscribe for Lubrizol CorporationDr.Lowne-Chase  Toya, CMA   Patient ID: Jo LoboMichele Jackson, female    DOB: May 26, 1971, 47 y.o.   MRN: [id number]  Chief Complaint  Patient presents with  . Bleeding/Bruising    Pt has been bruisig since beginning anti depressants  . Follow-up    HPI  Patient is in today for 3 month follow up  She is doing well with trintellix   Patient Care Team: Zola ButtonLowne Chase, Grayling CongressYvonne R, DO as PCP - General   Past Medical History:  Diagnosis Date  . Abnormal Pap smear    Repeats WNL  . ADD (attention deficit disorder)   . Anxiety   . Arthritis    Oseteoarthritis  . Depression   . GERD (gastroesophageal reflux disease)   . Headache(784.0)    Migraines  . Junctional escape rhythm   . Neuromuscular disorder (HCC)    Fibromyalgia  . Pregnancy induced hypertension   . Supraventricular tachycardia [facility](HCC)     Past Surgical History:  Procedure Laterality Date  . ABLATION OF DYSRHYTHMIC FOCUS    . APPENDECTOMY  1986  . CARPAL TUNNEL RELEASE  2000  . CERVICAL FUSION  08-24-99,04-26-00,05-09-01  . Knee sugery Left    16 yrs. old  . SHOULDER SURGERY  left-6-04, right 09-12-04  . tongue polyp  1984    Family History  Problem Relation Age of Onset  . Hodgkin's lymphoma Father   . COPD Mother   . Heart disease Mother        chf  . Cancer Paternal Aunt        breast  . Diabetes Unknown   . Thyroid disease Unknown   . Hypertension Unknown   . Depression Unknown   . Bipolar disorder Unknown     Social History   Socioeconomic History  . Marital status: Married    Spouse name: Not on file  . Number of children: 2  . Years of education: Not on file  . Highest education level: Not on file  Social Needs  . Financial resource strain: Not on file  . Food insecurity - worry: Not on file  . Food insecurity - inability: Not on file  . Transportation needs - medical: Not on file  . Transportation needs - non-medical: Not on file  Occupational History      Employer: [employer]    Comment: P/T  . 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    Employer: [employer]  Tobacco Use  . Smoking status: Former Smoker    Last attempt to quit: 10/23/2007    Years since quitting: 9.7  . Smokeless tobacco: Never Used  Substance and Sexual Activity  . Alcohol use: Yes    Alcohol/week: 0.0 oz    Comment: Occ  . Drug use: No  . Sexual activity: Yes  Other Topics Concern  . Not on file  Social History Narrative   G1p1      Exercise- no    Outpatient Medications Prior to Visit  Medication Sig Dispense Refill  . amphetamine-dextroamphetamine (ADDERALL) 30 MG tablet Take 1 tablet by mouth 2 (two) times daily. 180 tablet 0  . HYDROcodone-acetaminophen (NORCO) 10-325 MG tablet Take 1 tablet by mouth every 6 (six) hours as needed. 30 tablet 0  . mometasone (NASONEX) 50 MCG/ACT nasal spray Place 2 sprays daily into the nose. 17 g 12  . PROAIR HFA 108 (90 Base) MCG/ACT inhaler INHALE 2 PUFFS INTO LUNGS EVERY 6 HOURS AS NEEDED FOR  WHEEZING OR SHORTNESS OF BREATH 8.5 Inhaler 2  . SUMAtriptan (IMITREX) 50 MG tablet TAKE 1 TABLET BY MOUTH ONCE, MAY REPEAT IN 2 HOURS IF HEADACHE PERSISTS OR RECURS 10 tablet 2  . traMADol (ULTRAM) 50 MG tablet Take 1 tablet (50 mg total) by mouth every 8 (eight) hours as needed. 30 tablet 0  . valACYclovir (VALTREX) 1000 MG tablet TAKE 1 TABLET (1,000 MG TOTAL) BY MOUTH 3 (THREE) TIMES DAILY. 30 tablet 5  . vortioxetine HBr (TRINTELLIX) 20 MG TABS Take 20 mg daily by mouth. 90 tablet 1  . ondansetron (ZOFRAN ODT) 8 MG disintegrating tablet Take 1 tablet (8 mg total) every 8 (eight) hours as needed by mouth for nausea or vomiting. 20 tablet 0  . tiZANidine (ZANAFLEX) 4 MG tablet Take 1 tablet (4 mg total) every 6 (six) hours as needed by mouth for muscle spasms. 30 tablet 0  . vortioxetine HBr (TRINTELLIX) 10 MG TABS 1 po qd 30 tablet 2   No facility-administered medications prior to visit.     Allergies  Allergen Reactions  . Ketorolac  Tromethamine Hives  . Morphine Other (See Comments)    "makes me the devil."  . Latex Rash and Other (See Comments)    Latex catheter caused extreme irritation    Review of Systems  Constitutional: Negative for chills, fever and malaise/fatigue.  HENT: Negative for congestion and hearing loss.   Eyes: Negative for discharge.  Respiratory: Negative for cough, sputum production and shortness of breath.   Cardiovascular: Negative for chest pain, palpitations and leg swelling.  Gastrointestinal: Negative for abdominal pain, blood in stool, constipation, diarrhea, heartburn, nausea and vomiting.  Genitourinary: Negative for dysuria, frequency, hematuria and urgency.  Musculoskeletal: Negative for back pain, falls and myalgias.  Skin: Negative for rash.  Neurological: Negative for dizziness, sensory change, loss of consciousness, weakness and headaches.  Endo/Heme/Allergies: Negative for environmental allergies. Does not bruise/bleed easily.  Psychiatric/Behavioral: Negative for depression and suicidal ideas. The patient is not nervous/anxious and does not have insomnia.        Objective:    Physical Exam  Constitutional: She is oriented to person, place, and time. She appears well-developed and well-nourished.  HENT:  Head: Normocephalic and atraumatic.  Eyes: Conjunctivae and EOM are normal.  Neck: Normal range of motion. Neck supple. No JVD present. Carotid bruit is not present. No thyromegaly present.  Cardiovascular: Normal rate, regular rhythm and normal heart sounds.  No murmur heard. Pulmonary/Chest: Effort normal and breath sounds normal. No respiratory distress. She has no wheezes. She has no rales. She exhibits no tenderness.  Musculoskeletal: She exhibits no edema.  Neurological: She is alert and oriented to person, place, and time.  Psychiatric: She has a normal mood and affect.  Nursing note and vitals reviewed.   BP 122/80   Pulse 73   Temp 97.9 F (36.6 C) (Oral)    Ht 5\' 7"  (1.702 m)   Wt 273 lb (123.8 kg)   LMP 07/07/2017 (Approximate)   SpO2 99%   BMI 42.76 kg/m  Wt Readings from Last 3 Encounters:  07/27/17 273 lb (123.8 kg)  05/28/17 278 lb 6.4 oz (126.3 kg)  05/10/17 282 lb (127.9 kg)   BP Readings from Last 3 Encounters:  07/27/17 122/80  05/28/17 132/87  05/10/17 120/88     Immunization History  Administered Date(s) Administered  . Influenza Whole 04/26/2009, 04/21/2010  . Influenza,inj,Quad PF,6+ Mos 04/23/2013, 04/22/2015, 05/11/2016, 05/10/2017    Health Maintenance  Topic Date  Due  . PAP SMEAR  02/13/2020  . TETANUS/TDAP  07/10/2022  . INFLUENZA VACCINE  Completed  . HIV Screening  Completed    Lab Results  Component Value Date   WBC 5.2 02/12/2017   HGB 14.0 02/12/2017   HCT 41.1 02/12/2017   PLT 284.0 02/12/2017   GLUCOSE 99 02/12/2017   CHOL 174 02/12/2017   TRIG 58.0 02/12/2017   HDL 72.20 02/12/2017   LDLCALC 91 02/12/2017   ALT 14 02/12/2017   AST 17 02/12/2017   NA 139 02/12/2017   K 4.5 02/12/2017   CL 102 02/12/2017   CREATININE 0.85 02/12/2017   BUN 10 02/12/2017   CO2 32 02/12/2017   TSH 1.98 02/12/2017   HGBA1C 5.4 06/06/2013    Lab Results  Component Value Date   TSH 1.98 02/12/2017   Lab Results  Component Value Date   WBC 5.2 02/12/2017   HGB 14.0 02/12/2017   HCT 41.1 02/12/2017   MCV 87.0 02/12/2017   PLT 284.0 02/12/2017   Lab Results  Component Value Date   NA 139 02/12/2017   K 4.5 02/12/2017   CO2 32 02/12/2017   GLUCOSE 99 02/12/2017   BUN 10 02/12/2017   CREATININE 0.85 02/12/2017   BILITOT 0.6 02/12/2017   ALKPHOS 54 02/12/2017   AST 17 02/12/2017   ALT 14 02/12/2017   PROT 6.9 02/12/2017   ALBUMIN 4.0 02/12/2017   CALCIUM 9.5 02/12/2017   GFR 76.85 02/12/2017   Lab Results  Component Value Date   CHOL 174 02/12/2017   Lab Results  Component Value Date   HDL 72.20 02/12/2017   Lab Results  Component Value Date   LDLCALC 91 02/12/2017   Lab Results    Component Value Date   TRIG 58.0 02/12/2017   Lab Results  Component Value Date   CHOLHDL 2 02/12/2017   Lab Results  Component Value Date   HGBA1C 5.4 06/06/2013         Assessment & Plan:   Problem List Items Addressed This Visit    None    Visit Diagnoses    Nausea    -  Primary   Relevant Medications   ondansetron (ZOFRAN ODT) 8 MG disintegrating tablet   tiZANidine (ZANAFLEX) 4 MG tablet   Muscle spasm       Relevant Medications   ondansetron (ZOFRAN ODT) 8 MG disintegrating tablet   tiZANidine (ZANAFLEX) 4 MG tablet   Osteoarthritis, unspecified osteoarthritis type, unspecified site       Relevant Medications   tiZANidine (ZANAFLEX) 4 MG tablet   Diclofenac Sodium (PENNSAID) 2 % SOLN      I have changed Zahniya Krock's ondansetron and tiZANidine. I am also having her start on Diclofenac Sodium. Additionally, I am having her maintain her SUMAtriptan, valACYclovir, PROAIR HFA, traMADol, HYDROcodone-acetaminophen, amphetamine-dextroamphetamine, vortioxetine HBr, and mometasone.  Meds ordered this encounter  Medications  . ondansetron (ZOFRAN ODT) 8 MG disintegrating tablet    Sig: Take 1 tablet (8 mg total) by mouth every 8 (eight) hours as needed for nausea or vomiting.    Dispense:  20 tablet    Refill:  0  . tiZANidine (ZANAFLEX) 4 MG tablet    Sig: Take 1 tablet (4 mg total) by mouth every 6 (six) hours as needed for muscle spasms.    Dispense:  30 tablet    Refill:  0  . Diclofenac Sodium (PENNSAID) 2 % SOLN    Sig: 4 g qid prn  Dispense:  112 g    Refill:  5    CMA served as scribe during this visit. History, Physical and Plan performed by medical provider. Documentation and orders reviewed and attested to.  [name], DO

## 2017-07-27 NOTE — Assessment & Plan Note (Signed)
Stable con't adderall  

## 2017-07-27 NOTE — Assessment & Plan Note (Signed)
Refill muscle relaxer -- stable

## 2017-07-27 NOTE — Assessment & Plan Note (Signed)
Stable con't trintellix  rto 3 months

## 2017-09-10 ENCOUNTER — Other Ambulatory Visit: Payer: Self-pay | Admitting: Family Medicine

## 2017-09-10 ENCOUNTER — Other Ambulatory Visit: Payer: Self-pay | Admitting: Family

## 2017-09-10 DIAGNOSIS — R110 Nausea: Secondary | ICD-10-CM

## 2017-09-10 DIAGNOSIS — M797 Fibromyalgia: Secondary | ICD-10-CM

## 2017-09-10 DIAGNOSIS — G43809 Other migraine, not intractable, without status migrainosus: Secondary | ICD-10-CM

## 2017-09-10 DIAGNOSIS — M62838 Other muscle spasm: Secondary | ICD-10-CM

## 2017-09-10 NOTE — Telephone Encounter (Signed)
Requesting:Tramadol Contract: 12/08/16 UDS:05/10/17 Last Visit: 10/25/17 low risk Next Visit: 10/26/17 Last Refill: 04/20/17  Please Advise

## 2017-09-11 NOTE — Telephone Encounter (Signed)
Tramadol refilled yesterday.  Is she taking both?

## 2017-09-12 MED ORDER — HYDROCODONE-ACETAMINOPHEN 10-325 MG PO TABS: 1.0000 | ORAL_TABLET | Freq: Four times a day (QID) | ORAL | 0 refills | Status: DC | PRN

## 2017-10-12 ENCOUNTER — Ambulatory Visit: Payer: BLUE CROSS/BLUE SHIELD | Admitting: Family Medicine

## 2017-10-12 ENCOUNTER — Encounter: Payer: Self-pay | Admitting: Family Medicine

## 2017-10-12 VITALS — BP 124/72 | HR 80 | Temp 98.4°F | Resp 16 | Ht 67.0 in | Wt 279.8 lb

## 2017-10-12 DIAGNOSIS — J324 Chronic pansinusitis: Secondary | ICD-10-CM

## 2017-10-12 DIAGNOSIS — Z79899 Other long term (current) drug therapy: Secondary | ICD-10-CM | POA: Diagnosis not present

## 2017-10-12 DIAGNOSIS — M797 Fibromyalgia: Secondary | ICD-10-CM | POA: Diagnosis not present

## 2017-10-12 MED ORDER — HYDROCODONE-ACETAMINOPHEN 10-325 MG PO TABS: 1.0000 | ORAL_TABLET | Freq: Four times a day (QID) | ORAL | 0 refills | Status: DC | PRN

## 2017-10-12 MED ORDER — CEFDINIR 300 MG PO CAPS: 300.0000 mg | ORAL_CAPSULE | Freq: Two times a day (BID) | ORAL | 0 refills | Status: DC

## 2017-10-12 NOTE — Assessment & Plan Note (Signed)
abx per orders con't nasacort And antihistamine

## 2017-10-12 NOTE — Patient Instructions (Signed)

## 2017-10-12 NOTE — Progress Notes (Signed)
Subjective:  I acted as a Neurosurgeonscribe for CMS Energy CorporationDr.Lowne-Chase. Jo Jackson, RMA   Patient ID: Jo LoboMichele Jackson, female    DOB: 1970-10-13, 47 y.o.   MRN: [id number]  Chief Complaint  Patient presents with  . Sinus Problem    HPI  Patient is in today for sinus congestion.  X 1 week -- she has been nasonex and zyrtec and claritin  No fever She also needs a refill on her pain meds   Patient Care Team: Zola ButtonLowne Chase, Grayling CongressYvonne R, DO as PCP - General   Past Medical History:  Diagnosis Date  . Abnormal Pap smear    Repeats WNL  . ADD (attention deficit disorder)   . Anxiety   . Arthritis    Oseteoarthritis  . Depression   . GERD (gastroesophageal reflux disease)   . Headache(784.0)    Migraines  . Junctional escape rhythm   . Neuromuscular disorder (HCC)    Fibromyalgia  . Pregnancy induced hypertension   . Supraventricular tachycardia [facility](HCC)     Past Surgical History:  Procedure Laterality Date  . ABLATION OF DYSRHYTHMIC FOCUS    . APPENDECTOMY  1986  . CARPAL TUNNEL RELEASE  2000  . CERVICAL FUSION  08-24-99,04-26-00,05-09-01  . Knee sugery Left    16 yrs. old  . SHOULDER SURGERY  left-6-04, right 09-12-04  . tongue polyp  1984    Family History  Problem Relation Age of Onset  . Hodgkin's lymphoma Father   . COPD Mother   . Heart disease Mother        chf  . Cancer Paternal Aunt        breast  . Diabetes Unknown   . Thyroid disease Unknown   . Hypertension Unknown   . Depression Unknown   . Bipolar disorder Unknown     Social History   Socioeconomic History  . Marital status: Married    Spouse name: Not on file  . Number of children: 2  . Years of education: Not on file  . Highest education level: Not on file  Occupational History    Employer: [employer]    Comment: P/T  . Occupation: SMALL SORT ArchitectORTER    Employer: [employer]  Social Needs  . Financial resource strain: Not on file  . Food insecurity:    Worry: Not on file    Inability: Not on file  .  Transportation needs:    Medical: Not on file    Non-medical: Not on file  Tobacco Use  . Smoking status: Former Smoker    Last attempt to quit: 10/23/2007    Years since quitting: 9.9  . Smokeless tobacco: Never Used  Substance and Sexual Activity  . Alcohol use: Yes    Alcohol/week: 0.0 oz    Comment: Occ  . Drug use: No  . Sexual activity: Yes  Lifestyle  . Physical activity:    Days per week: Not on file    Minutes per session: Not on file  . Stress: Not on file  Relationships  . Social connections:    Talks on phone: Not on file    Gets together: Not on file    Attends religious service: Not on file    Active member of club or organization: Not on file    Attends meetings of clubs or organizations: Not on file    Relationship status: Not on file  . Intimate partner violence:    Fear of current or ex partner: Not on file  Emotionally abused: Not on file    Physically abused: Not on file    Forced sexual activity: Not on file  Other Topics Concern  . Not on file  Social History Narrative   G1p1      Exercise- no    Outpatient Medications Prior to Visit  Medication Sig Dispense Refill  . amphetamine-dextroamphetamine (ADDERALL) 30 MG tablet Take 1 tablet by mouth 2 (two) times daily. 180 tablet 0  . Diclofenac Sodium (PENNSAID) 2 % SOLN 4 g qid prn 112 g 5  . mometasone (NASONEX) 50 MCG/ACT nasal spray Place 2 sprays daily into the nose. 17 g 12  . norethindrone (MICRONOR,CAMILA,ERRIN) 0.35 MG tablet Take 1 tablet by mouth daily.    . ondansetron (ZOFRAN-ODT) 8 MG disintegrating tablet TAKE 1 TABLET (8 MG TOTAL) BY MOUTH EVERY 8 (EIGHT) HOURS AS NEEDED FOR NAUSEA OR VOMITING. 20 tablet 0  . PROAIR HFA 108 (90 Base) MCG/ACT inhaler INHALE 2 PUFFS INTO LUNGS EVERY 6 HOURS AS NEEDED FOR WHEEZING OR SHORTNESS OF BREATH 8.5 Inhaler 2  . SUMAtriptan (IMITREX) 50 MG tablet TAKE 1 TABLET BY MOUTH ONCE, MAY REPEAT IN 2 HOURS IF HEADACHE PERSISTS OR RECURS 10 tablet 2  .  tiZANidine (ZANAFLEX) 4 MG tablet TAKE 1 TABLET (4 MG TOTAL) BY MOUTH EVERY 6 (SIX) HOURS AS NEEDED FOR MUSCLE SPASMS 30 tablet 0  . traMADol (ULTRAM) 50 MG tablet TAKE 1 TABLET BY MOUTH EVERY 8 HOURS AS NEEDED 30 tablet 0  . valACYclovir (VALTREX) 1000 MG tablet TAKE 1 TABLET (1,000 MG TOTAL) BY MOUTH 3 (THREE) TIMES DAILY. 30 tablet 5  . vortioxetine HBr (TRINTELLIX) 20 MG TABS Take 20 mg daily by mouth. 90 tablet 1  . HYDROcodone-acetaminophen (NORCO) 10-325 MG tablet Take 1 tablet by mouth every 6 (six) hours as needed. 30 tablet 0   No facility-administered medications prior to visit.     Allergies  Allergen Reactions  . Ketorolac Tromethamine Hives  . Morphine Other (See Comments)    "makes me the devil."  . Latex Rash and Other (See Comments)    Latex catheter caused extreme irritation    Review of Systems  Constitutional: Negative for chills, fever and malaise/fatigue.  HENT: Negative for congestion and hearing loss.   Eyes: Negative for discharge.  Respiratory: Negative for cough, sputum production and shortness of breath.   Cardiovascular: Negative for chest pain, palpitations and leg swelling.  Gastrointestinal: Negative for abdominal pain, blood in stool, constipation, diarrhea, heartburn, nausea and vomiting.  Genitourinary: Negative for dysuria, frequency, hematuria and urgency.  Musculoskeletal: Negative for back pain, falls and myalgias.  Skin: Negative for rash.  Neurological: Negative for dizziness, sensory change, loss of consciousness, weakness and headaches.  Endo/Heme/Allergies: Negative for environmental allergies. Does not bruise/bleed easily.  Psychiatric/Behavioral: Negative for depression and suicidal ideas. The patient is not nervous/anxious and does not have insomnia.        Objective:    Physical Exam  Constitutional: She is oriented to person, place, and time. She appears well-developed and well-nourished.  HENT:  Right Ear: External ear normal.    Left Ear: External ear normal.  Nose: Right sinus exhibits maxillary sinus tenderness and frontal sinus tenderness. Left sinus exhibits maxillary sinus tenderness and frontal sinus tenderness.  Mouth/Throat: Posterior oropharyngeal erythema present.  + PND + errythema  Eyes: Conjunctivae are normal. Right eye exhibits no discharge. Left eye exhibits no discharge.  Cardiovascular: Normal rate, regular rhythm and normal heart sounds.  No murmur  heard. Pulmonary/Chest: Effort normal and breath sounds normal. No respiratory distress. She has no wheezes. She has no rales. She exhibits no tenderness.  Musculoskeletal: She exhibits no edema.  Lymphadenopathy:    She has cervical adenopathy.  Neurological: She is alert and oriented to person, place, and time.  Nursing note and vitals reviewed.   BP 124/72 (BP Location: Left Arm, Patient Position: Sitting, Cuff Size: Large)   Pulse 80   Temp 98.4 F (36.9 C) (Oral)   Resp 16   Ht 5\' 7"  (1.702 m)   Wt 279 lb 12.8 oz (126.9 kg)   SpO2 96%   BMI 43.82 kg/m  Wt Readings from Last 3 Encounters:  10/12/17 279 lb 12.8 oz (126.9 kg)  07/27/17 273 lb (123.8 kg)  05/28/17 278 lb 6.4 oz (126.3 kg)   BP Readings from Last 3 Encounters:  10/12/17 124/72  07/27/17 122/80  05/28/17 132/87     Immunization History  Administered Date(s) Administered  . Influenza Whole 04/26/2009, 04/21/2010  . Influenza,inj,Quad PF,6+ Mos 04/23/2013, 04/22/2015, 05/11/2016, 05/10/2017    Health Maintenance  Topic Date Due  . PAP SMEAR  02/13/2020  . TETANUS/TDAP  07/10/2022  . INFLUENZA VACCINE  Completed  . HIV Screening  Completed    Lab Results  Component Value Date   WBC 5.2 02/12/2017   HGB 14.0 02/12/2017   HCT 41.1 02/12/2017   PLT 284.0 02/12/2017   GLUCOSE 99 02/12/2017   CHOL 174 02/12/2017   TRIG 58.0 02/12/2017   HDL 72.20 02/12/2017   LDLCALC 91 02/12/2017   ALT 14 02/12/2017   AST 17 02/12/2017   NA 139 02/12/2017   K 4.5  02/12/2017   CL 102 02/12/2017   CREATININE 0.85 02/12/2017   BUN 10 02/12/2017   CO2 32 02/12/2017   TSH 1.98 02/12/2017   HGBA1C 5.4 06/06/2013    Lab Results  Component Value Date   TSH 1.98 02/12/2017   Lab Results  Component Value Date   WBC 5.2 02/12/2017   HGB 14.0 02/12/2017   HCT 41.1 02/12/2017   MCV 87.0 02/12/2017   PLT 284.0 02/12/2017   Lab Results  Component Value Date   NA 139 02/12/2017   K 4.5 02/12/2017   CO2 32 02/12/2017   GLUCOSE 99 02/12/2017   BUN 10 02/12/2017   CREATININE 0.85 02/12/2017   BILITOT 0.6 02/12/2017   ALKPHOS 54 02/12/2017   AST 17 02/12/2017   ALT 14 02/12/2017   PROT 6.9 02/12/2017   ALBUMIN 4.0 02/12/2017   CALCIUM 9.5 02/12/2017   GFR 76.85 02/12/2017   Lab Results  Component Value Date   CHOL 174 02/12/2017   Lab Results  Component Value Date   HDL 72.20 02/12/2017   Lab Results  Component Value Date   LDLCALC 91 02/12/2017   Lab Results  Component Value Date   TRIG 58.0 02/12/2017   Lab Results  Component Value Date   CHOLHDL 2 02/12/2017   Lab Results  Component Value Date   HGBA1C 5.4 06/06/2013         Assessment & Plan:   Problem List Items Addressed This Visit      Unprioritized   Fibromyalgia   Relevant Medications   HYDROcodone-acetaminophen (NORCO) 10-325 MG tablet   Pansinusitis    abx per orders con't nasacort And antihistamine       Relevant Medications   cefdinir (OMNICEF) 300 MG capsule    Other Visit Diagnoses    High risk  medication use    -  Primary   Relevant Orders   Pain Mgmt, Profile 8 w/Conf, U      I am having Jo Jackson start on cefdinir. I am also having her maintain her SUMAtriptan, valACYclovir, PROAIR HFA, amphetamine-dextroamphetamine, vortioxetine HBr, mometasone, Diclofenac Sodium, ondansetron, traMADol, tiZANidine, norethindrone, and HYDROcodone-acetaminophen.  Meds ordered this encounter  Medications  . HYDROcodone-acetaminophen (NORCO)  10-325 MG tablet    Sig: Take 1 tablet by mouth every 6 (six) hours as needed.    Dispense:  30 tablet    Refill:  0  . cefdinir (OMNICEF) 300 MG capsule    Sig: Take 1 capsule (300 mg total) by mouth 2 (two) times daily.    Dispense:  20 capsule    Refill:  0    CMA served as scribe during this visit. History, Physical and Plan performed by medical provider. Documentation and orders reviewed and attested to.  [name], DO

## 2017-10-14 LAB — PAIN MGMT, PROFILE 8 W/CONF, U
6 Acetylmorphine: NEGATIVE ng/mL (ref <10)
AMPHETAMINE: 3604 ng/mL — AB (ref <250)
AMPHETAMINES: POSITIVE ng/mL — AB (ref <500)
Alcohol Metabolites: NEGATIVE ng/mL (ref <500)
BENZODIAZEPINES: NEGATIVE ng/mL (ref <100)
BUPRENORPHINE, URINE: NEGATIVE ng/mL (ref <5)
CREATININE: 104.4 mg/dL
Cocaine Metabolite: NEGATIVE ng/mL (ref <150)
MDMA: NEGATIVE ng/mL (ref <500)
Marijuana Metabolite: NEGATIVE ng/mL (ref <20)
Methamphetamine: NEGATIVE ng/mL (ref <250)
OPIATES: NEGATIVE ng/mL (ref <100)
Oxidant: NEGATIVE ug/mL (ref <200)
Oxycodone: NEGATIVE ng/mL (ref <100)
PH: 6.17 (ref 4.5–9.0)

## 2017-10-26 ENCOUNTER — Ambulatory Visit: Payer: BLUE CROSS/BLUE SHIELD | Admitting: Family Medicine

## 2017-11-05 ENCOUNTER — Other Ambulatory Visit: Payer: Self-pay | Admitting: Family Medicine

## 2017-11-05 DIAGNOSIS — F988 Other specified behavioral and emotional disorders with onset usually occurring in childhood and adolescence: Secondary | ICD-10-CM

## 2017-11-05 DIAGNOSIS — R110 Nausea: Secondary | ICD-10-CM

## 2017-11-05 DIAGNOSIS — G43809 Other migraine, not intractable, without status migrainosus: Secondary | ICD-10-CM

## 2017-11-05 DIAGNOSIS — M62838 Other muscle spasm: Secondary | ICD-10-CM

## 2017-11-07 MED ORDER — ONDANSETRON 8 MG PO TBDP: 8.0000 mg | ORAL_TABLET | Freq: Three times a day (TID) | ORAL | 1 refills | Status: DC | PRN

## 2017-11-07 MED ORDER — TIZANIDINE HCL 4 MG PO TABS: 4.0000 mg | ORAL_TABLET | Freq: Four times a day (QID) | ORAL | 1 refills | Status: DC | PRN

## 2017-11-07 NOTE — Telephone Encounter (Signed)
Pt requesting refill on tramadol and Adderall.   Last OV: 10/12/2017 Last Fill on tramadol: 09/10/2017 #30 and 0rf Last Fill on Adderall: 05/10/2017 #180 and 0rf UDS, CSC  10/12/2017 Low risk   NCCR printed- no discrepancies noted- placed on ledge.

## 2017-11-08 MED ORDER — TRAMADOL HCL 50 MG PO TABS: 50.0000 mg | ORAL_TABLET | Freq: Three times a day (TID) | ORAL | 0 refills | Status: DC | PRN

## 2017-11-08 MED ORDER — AMPHETAMINE-DEXTROAMPHETAMINE 30 MG PO TABS: 30.0000 mg | ORAL_TABLET | Freq: Two times a day (BID) | ORAL | 0 refills | Status: DC

## 2017-11-08 NOTE — Addendum Note (Signed)
Addended by: Thelma BargeICHARDSON, Ismar Yabut D on: 11/08/2017 03:22 PM   Modules accepted: Orders

## 2017-11-08 NOTE — Telephone Encounter (Signed)
Can you send in electronically  

## 2017-11-15 ENCOUNTER — Encounter: Payer: Self-pay | Admitting: Family Medicine

## 2017-11-15 NOTE — Telephone Encounter (Signed)
We have tried a lot--- I think we need to get her to psychiatry We can give her numbers to call for appointment

## 2017-11-26 ENCOUNTER — Ambulatory Visit: Payer: Self-pay | Admitting: Family Medicine

## 2017-11-26 ENCOUNTER — Other Ambulatory Visit: Payer: Self-pay | Admitting: Family Medicine

## 2017-11-26 MED ORDER — LEVOMILNACIPRAN HCL ER 20 & 40 MG PO C4PK: EXTENDED_RELEASE_CAPSULE | ORAL | 0 refills | Status: DC

## 2017-11-29 ENCOUNTER — Other Ambulatory Visit: Payer: Self-pay | Admitting: Family Medicine

## 2017-11-29 DIAGNOSIS — F320 Major depressive disorder, single episode, mild: Secondary | ICD-10-CM

## 2017-12-01 ENCOUNTER — Other Ambulatory Visit: Payer: Self-pay | Admitting: Family Medicine

## 2017-12-01 DIAGNOSIS — M797 Fibromyalgia: Secondary | ICD-10-CM

## 2017-12-01 DIAGNOSIS — G43809 Other migraine, not intractable, without status migrainosus: Secondary | ICD-10-CM

## 2017-12-03 MED ORDER — TRAMADOL HCL 50 MG PO TABS: 50.0000 mg | ORAL_TABLET | Freq: Three times a day (TID) | ORAL | 0 refills | Status: DC | PRN

## 2017-12-03 MED ORDER — HYDROCODONE-ACETAMINOPHEN 10-325 MG PO TABS: 1.0000 | ORAL_TABLET | Freq: Four times a day (QID) | ORAL | 0 refills | Status: DC | PRN

## 2017-12-03 NOTE — Telephone Encounter (Signed)
Last Tramadol RX: 11/08/17, #30 Last Hydrocodone RX: 10/12/17, #30 Last OV: 10/12/17 Next OV: 01/07/18 UDS: 10/12/17, low risk due 6 months CSC: 10/12/17 CSR: No discrepancies identified

## 2017-12-31 ENCOUNTER — Other Ambulatory Visit: Payer: Self-pay | Admitting: Family Medicine

## 2017-12-31 DIAGNOSIS — F330 Major depressive disorder, recurrent, mild: Secondary | ICD-10-CM

## 2017-12-31 MED ORDER — LEVOMILNACIPRAN HCL ER 20 & 40 MG PO C4PK: EXTENDED_RELEASE_CAPSULE | ORAL | 4 refills | Status: DC

## 2017-12-31 NOTE — Telephone Encounter (Signed)
Requesting: Fetzima 20mg  and 40mg  C4PK. Had it sent on 5/6 but is requesting 90-day supply. Contract: yes UDS: 10/12/17 Last OV: 10/12/17 Next Ov: 01/07/18 Last refill: 11/26/17 Database: OD risk score 100/999  Order pended. Please advise.

## 2018-01-02 ENCOUNTER — Encounter: Payer: Self-pay | Admitting: Family Medicine

## 2018-01-03 MED ORDER — LEVOMILNACIPRAN HCL ER 40 MG PO CP24: 1.0000 | ORAL_CAPSULE | Freq: Every day | ORAL | 1 refills | Status: DC

## 2018-01-07 ENCOUNTER — Encounter: Payer: Self-pay | Admitting: Family Medicine

## 2018-01-07 ENCOUNTER — Ambulatory Visit (INDEPENDENT_AMBULATORY_CARE_PROVIDER_SITE_OTHER): Payer: BLUE CROSS/BLUE SHIELD | Admitting: Family Medicine

## 2018-01-07 VITALS — BP 112/78 | HR 92 | Temp 98.2°F | Resp 18 | Ht 66.93 in | Wt 275.2 lb

## 2018-01-07 DIAGNOSIS — M62838 Other muscle spasm: Secondary | ICD-10-CM

## 2018-01-07 DIAGNOSIS — R110 Nausea: Secondary | ICD-10-CM | POA: Diagnosis not present

## 2018-01-07 DIAGNOSIS — M797 Fibromyalgia: Secondary | ICD-10-CM | POA: Diagnosis not present

## 2018-01-07 DIAGNOSIS — Z79899 Other long term (current) drug therapy: Secondary | ICD-10-CM | POA: Diagnosis not present

## 2018-01-07 MED ORDER — ONDANSETRON 8 MG PO TBDP: 8.0000 mg | ORAL_TABLET | Freq: Three times a day (TID) | ORAL | 1 refills | Status: DC | PRN

## 2018-01-07 MED ORDER — HYDROCODONE-ACETAMINOPHEN 10-325 MG PO TABS: 1.0000 | ORAL_TABLET | Freq: Four times a day (QID) | ORAL | 0 refills | Status: DC | PRN

## 2018-01-07 MED ORDER — TIZANIDINE HCL 4 MG PO TABS: 4.0000 mg | ORAL_TABLET | Freq: Four times a day (QID) | ORAL | 1 refills | Status: DC | PRN

## 2018-01-07 NOTE — Progress Notes (Signed)
Subjective:  I acted as a Neurosurgeonscribe for [employer].Lowne-Chase. Jo Jackson, RMA   Patient ID: Jo LoboMichele Jackson, female    DOB: September 20, 1970, 47 y.o.   MRN: [id number]  Chief Complaint  Patient presents with  . medication follow up    HPI  Patient is in today for medication follow up.  Mom is doing well --  No complaints.  meds working well.    Patient Care Team: Zola ButtonLowne Chase, Grayling CongressYvonne R, DO as PCP - General   Past Medical History:  Diagnosis Date  . Abnormal Pap smear    Repeats WNL  . ADD (attention deficit disorder)   . Anxiety   . Arthritis    Oseteoarthritis  . Depression   . GERD (gastroesophageal reflux disease)   . Headache(784.0)    Migraines  . Junctional escape rhythm   . Neuromuscular disorder (HCC)    Fibromyalgia  . Pregnancy induced hypertension   . Supraventricular tachycardia [facility](HCC)     Past Surgical History:  Procedure Laterality Date  . ABLATION OF DYSRHYTHMIC FOCUS    . APPENDECTOMY  1986  . CARPAL TUNNEL RELEASE  2000  . CERVICAL FUSION  08-24-99,04-26-00,05-09-01  . Knee sugery Left    16 yrs. old  . SHOULDER SURGERY  left-6-04, right 09-12-04  . tongue polyp  1984    Family History  Problem Relation Age of Onset  . Hodgkin's lymphoma Father   . COPD Mother   . Heart disease Mother        chf  . Cancer Paternal Aunt        breast  . Diabetes Unknown   . Thyroid disease Unknown   . Hypertension Unknown   . Depression Unknown   . Bipolar disorder Unknown     Social History   Socioeconomic History  . Marital status: Married    Spouse name: Not on file  . Number of children: 2  . Years of education: Not on file  . Highest education level: Not on file  Occupational History    Employer: [employer]    Comment: P/T  . Occupation: SMALL SORT ArchitectORTER    Employer: [employer]  Social Needs  . Financial resource strain: Not on file  . Food insecurity:    Worry: Not on file    Inability: Not on file  . Transportation needs:    Medical: Not on file      Non-medical: Not on file  Tobacco Use  . Smoking status: Former Smoker    Last attempt to quit: 10/23/2007    Years since quitting: 10.2  . Smokeless tobacco: Never Used  Substance and Sexual Activity  . Alcohol use: Yes    Alcohol/week: 0.0 oz    Comment: Occ  . Drug use: No  . Sexual activity: Yes  Lifestyle  . Physical activity:    Days per week: Not on file    Minutes per session: Not on file  . Stress: Not on file  Relationships  . Social connections:    Talks on phone: Not on file    Gets together: Not on file    Attends religious service: Not on file    Active member of club or organization: Not on file    Attends meetings of clubs or organizations: Not on file    Relationship status: Not on file  . Intimate partner violence:    Fear of current or ex partner: Not on file    Emotionally abused: Not on file  Physically abused: Not on file    Forced sexual activity: Not on file  Other Topics Concern  . Not on file  Social History Narrative   G1p1      Exercise- no    Outpatient Medications Prior to Visit  Medication Sig Dispense Refill  . amphetamine-dextroamphetamine (ADDERALL) 30 MG tablet Take 1 tablet by mouth 2 (two) times daily. 180 tablet 0  . cefdinir (OMNICEF) 300 MG capsule Take 1 capsule (300 mg total) by mouth 2 (two) times daily. 20 capsule 0  . Diclofenac Sodium (PENNSAID) 2 % SOLN 4 g qid prn 112 g 5  . Levomilnacipran HCl ER 40 MG CP24 Take 1 capsule by mouth daily. 90 capsule 1  . mometasone (NASONEX) 50 MCG/ACT nasal spray Place 2 sprays daily into the nose. 17 g 12  . norethindrone (MICRONOR,CAMILA,ERRIN) 0.35 MG tablet Take 1 tablet by mouth daily.    Marland Kitchen PROAIR HFA 108 (90 Base) MCG/ACT inhaler INHALE 2 PUFFS INTO LUNGS EVERY 6 HOURS AS NEEDED FOR WHEEZING OR SHORTNESS OF BREATH 8.5 Inhaler 2  . SUMAtriptan (IMITREX) 50 MG tablet TAKE 1 TABLET BY MOUTH ONCE, MAY REPEAT IN 2 HOURS IF HEADACHE PERSISTS OR RECURS 10 tablet 2  . traMADol (ULTRAM)  50 MG tablet Take 1 tablet (50 mg total) by mouth every 8 (eight) hours as needed. 30 tablet 0  . TRINTELLIX 20 MG TABS tablet TAKE 1 TABLET BY MOUTH EVERY DAY 90 tablet 1  . valACYclovir (VALTREX) 1000 MG tablet TAKE 1 TABLET (1,000 MG TOTAL) BY MOUTH 3 (THREE) TIMES DAILY. 30 tablet 5  . HYDROcodone-acetaminophen (NORCO) 10-325 MG tablet Take 1 tablet by mouth every 6 (six) hours as needed. 30 tablet 0  . ondansetron (ZOFRAN-ODT) 8 MG disintegrating tablet Take 1 tablet (8 mg total) by mouth every 8 (eight) hours as needed for nausea or vomiting. 20 tablet 1  . tiZANidine (ZANAFLEX) 4 MG tablet Take 1 tablet (4 mg total) by mouth every 6 (six) hours as needed for muscle spasms. 30 tablet 1   No facility-administered medications prior to visit.     Allergies  Allergen Reactions  . Ketorolac Tromethamine Hives  . Morphine Other (See Comments)    "makes me the devil."  . Latex Rash and Other (See Comments)    Latex catheter caused extreme irritation    Review of Systems  Constitutional: Negative for chills, fever and malaise/fatigue.  HENT: Negative for congestion and hearing loss.   Eyes: Negative for discharge.  Respiratory: Negative for cough, sputum production and shortness of breath.   Cardiovascular: Negative for chest pain, palpitations and leg swelling.  Gastrointestinal: Negative for abdominal pain, blood in stool, constipation, diarrhea, heartburn, nausea and vomiting.  Genitourinary: Negative for dysuria, frequency, hematuria and urgency.  Musculoskeletal: Negative for back pain, falls and myalgias.  Skin: Negative for rash.  Neurological: Negative for dizziness, sensory change, loss of consciousness, weakness and headaches.  Endo/Heme/Allergies: Negative for environmental allergies. Does not bruise/bleed easily.  Psychiatric/Behavioral: Negative for depression and suicidal ideas. The patient is not nervous/anxious and does not have insomnia.        Objective:      Physical Exam  Constitutional: She is oriented to person, place, and time. She appears well-developed and well-nourished.  HENT:  Head: Normocephalic and atraumatic.  Eyes: Conjunctivae and EOM are normal.  Neck: Normal range of motion. Neck supple. No JVD present. Carotid bruit is not present. No thyromegaly present.  Cardiovascular: Normal rate, regular rhythm and  normal heart sounds.  No murmur heard. Pulmonary/Chest: Effort normal and breath sounds normal. No respiratory distress. She has no wheezes. She has no rales. She exhibits no tenderness.  Musculoskeletal: She exhibits no edema.  Neurological: She is alert and oriented to person, place, and time.  Psychiatric: She has a normal mood and affect.  Nursing note and vitals reviewed.   BP 112/78 (BP Location: Left Arm, Patient Position: Sitting, Cuff Size: Large)   Pulse 92   Temp 98.2 F (36.8 C) (Oral)   Resp 18   Ht 5' 6.93" (1.7 m)   Wt 275 lb 3.2 oz (124.8 kg)   SpO2 97%   BMI 43.19 kg/m  Wt Readings from Last 3 Encounters:  01/07/18 275 lb 3.2 oz (124.8 kg)  10/12/17 279 lb 12.8 oz (126.9 kg)  07/27/17 273 lb (123.8 kg)   BP Readings from Last 3 Encounters:  01/07/18 112/78  10/12/17 124/72  07/27/17 122/80     Immunization History  Administered Date(s) Administered  . Influenza Whole 04/26/2009, 04/21/2010  . Influenza,inj,Quad PF,6+ Mos 04/23/2013, 04/22/2015, 05/11/2016, 05/10/2017    Health Maintenance  Topic Date Due  . INFLUENZA VACCINE  02/21/2018  . PAP SMEAR  02/13/2020  . TETANUS/TDAP  07/10/2022  . HIV Screening  Completed    Lab Results  Component Value Date   WBC 5.2 02/12/2017   HGB 14.0 02/12/2017   HCT 41.1 02/12/2017   PLT 284.0 02/12/2017   GLUCOSE 99 02/12/2017   CHOL 174 02/12/2017   TRIG 58.0 02/12/2017   HDL 72.20 02/12/2017   LDLCALC 91 02/12/2017   ALT 14 02/12/2017   AST 17 02/12/2017   NA 139 02/12/2017   K 4.5 02/12/2017   CL 102 02/12/2017   CREATININE 0.85  02/12/2017   BUN 10 02/12/2017   CO2 32 02/12/2017   TSH 1.98 02/12/2017   HGBA1C 5.4 06/06/2013    Lab Results  Component Value Date   TSH 1.98 02/12/2017   Lab Results  Component Value Date   WBC 5.2 02/12/2017   HGB 14.0 02/12/2017   HCT 41.1 02/12/2017   MCV 87.0 02/12/2017   PLT 284.0 02/12/2017   Lab Results  Component Value Date   NA 139 02/12/2017   K 4.5 02/12/2017   CO2 32 02/12/2017   GLUCOSE 99 02/12/2017   BUN 10 02/12/2017   CREATININE 0.85 02/12/2017   BILITOT 0.6 02/12/2017   ALKPHOS 54 02/12/2017   AST 17 02/12/2017   ALT 14 02/12/2017   PROT 6.9 02/12/2017   ALBUMIN 4.0 02/12/2017   CALCIUM 9.5 02/12/2017   GFR 76.85 02/12/2017   Lab Results  Component Value Date   CHOL 174 02/12/2017   Lab Results  Component Value Date   HDL 72.20 02/12/2017   Lab Results  Component Value Date   LDLCALC 91 02/12/2017   Lab Results  Component Value Date   TRIG 58.0 02/12/2017   Lab Results  Component Value Date   CHOLHDL 2 02/12/2017   Lab Results  Component Value Date   HGBA1C 5.4 06/06/2013         Assessment & Plan:   Problem List Items Addressed This Visit      Unprioritized   Fibromyalgia   Relevant Medications   HYDROcodone-acetaminophen (NORCO) 10-325 MG tablet    Other Visit Diagnoses    High risk medication use    -  Primary   Relevant Orders   Pain Mgmt, Profile 8 w/Conf, U  Nausea       Relevant Medications   tiZANidine (ZANAFLEX) 4 MG tablet   ondansetron (ZOFRAN-ODT) 8 MG disintegrating tablet   Muscle spasm       Relevant Medications   tiZANidine (ZANAFLEX) 4 MG tablet   ondansetron (ZOFRAN-ODT) 8 MG disintegrating tablet    pt doing well with meds.  No complaints.    I am having Jo Jackson maintain her SUMAtriptan, valACYclovir, PROAIR HFA, mometasone, Diclofenac Sodium, norethindrone, cefdinir, amphetamine-dextroamphetamine, TRINTELLIX, traMADol, Levomilnacipran HCl ER, HYDROcodone-acetaminophen,  tiZANidine, and ondansetron.  Meds ordered this encounter  Medications  . HYDROcodone-acetaminophen (NORCO) 10-325 MG tablet    Sig: Take 1 tablet by mouth every 6 (six) hours as needed.    Dispense:  30 tablet    Refill:  0  . tiZANidine (ZANAFLEX) 4 MG tablet    Sig: Take 1 tablet (4 mg total) by mouth every 6 (six) hours as needed for muscle spasms.    Dispense:  30 tablet    Refill:  1  . ondansetron (ZOFRAN-ODT) 8 MG disintegrating tablet    Sig: Take 1 tablet (8 mg total) by mouth every 8 (eight) hours as needed for nausea or vomiting.    Dispense:  20 tablet    Refill:  1    CMA served as scribe during this visit. History, Physical and Plan performed by medical provider. Documentation and orders reviewed and attested to.  [name], DO

## 2018-01-07 NOTE — Patient Instructions (Signed)

## 2018-01-08 LAB — PAIN MGMT, PROFILE 8 W/CONF, U
6 ACETYLMORPHINE: NEGATIVE ng/mL (ref <10)
ALCOHOL METABOLITES: NEGATIVE ng/mL (ref <500)
Amphetamines: NEGATIVE ng/mL (ref <500)
Benzodiazepines: NEGATIVE ng/mL (ref <100)
Buprenorphine, Urine: NEGATIVE ng/mL (ref <5)
COCAINE METABOLITE: NEGATIVE ng/mL (ref <150)
Creatinine: 89.1 mg/dL
MARIJUANA METABOLITE: NEGATIVE ng/mL (ref <20)
MDMA: NEGATIVE ng/mL (ref <500)
OPIATES: NEGATIVE ng/mL (ref <100)
OXIDANT: NEGATIVE ug/mL (ref <200)
OXYCODONE: NEGATIVE ng/mL (ref <100)
pH: 6.38 (ref 4.5–9.0)

## 2018-01-15 ENCOUNTER — Encounter: Payer: Self-pay | Admitting: Family Medicine

## 2018-01-15 ENCOUNTER — Other Ambulatory Visit: Payer: Self-pay | Admitting: Family Medicine

## 2018-01-15 DIAGNOSIS — F322 Major depressive disorder, single episode, severe without psychotic features: Secondary | ICD-10-CM

## 2018-01-15 MED ORDER — ARIPIPRAZOLE 2 MG PO TABS: ORAL_TABLET | ORAL | 0 refills | Status: DC

## 2018-01-15 NOTE — Telephone Encounter (Signed)
Add abilify 2 mg daily x 1 week then increase to 2 a day -------  She should call before she runs out and I will increase from there

## 2018-01-17 ENCOUNTER — Encounter: Payer: Self-pay | Admitting: Family Medicine

## 2018-01-22 ENCOUNTER — Encounter: Payer: Self-pay | Admitting: Family Medicine

## 2018-01-22 ENCOUNTER — Other Ambulatory Visit: Payer: Self-pay | Admitting: Family Medicine

## 2018-01-22 MED ORDER — DOXYCYCLINE HYCLATE 100 MG PO TABS: 100.0000 mg | ORAL_TABLET | Freq: Two times a day (BID) | ORAL | 0 refills | Status: DC

## 2018-01-22 NOTE — Telephone Encounter (Signed)
Please advise in absence of PCP 

## 2018-01-31 ENCOUNTER — Other Ambulatory Visit: Payer: Self-pay | Admitting: Family Medicine

## 2018-01-31 DIAGNOSIS — R110 Nausea: Secondary | ICD-10-CM

## 2018-01-31 DIAGNOSIS — M62838 Other muscle spasm: Secondary | ICD-10-CM

## 2018-02-04 ENCOUNTER — Other Ambulatory Visit: Payer: Self-pay | Admitting: Family Medicine

## 2018-02-04 ENCOUNTER — Encounter: Payer: Self-pay | Admitting: Family Medicine

## 2018-02-04 ENCOUNTER — Telehealth: Payer: Self-pay | Admitting: *Deleted

## 2018-02-04 ENCOUNTER — Encounter: Payer: Self-pay | Admitting: *Deleted

## 2018-02-04 DIAGNOSIS — G43809 Other migraine, not intractable, without status migrainosus: Secondary | ICD-10-CM

## 2018-02-04 DIAGNOSIS — F322 Major depressive disorder, single episode, severe without psychotic features: Secondary | ICD-10-CM

## 2018-02-04 MED ORDER — ARIPIPRAZOLE 2 MG PO TABS: ORAL_TABLET | ORAL | 0 refills | Status: DC

## 2018-02-04 MED ORDER — TRAMADOL HCL 50 MG PO TABS: 50.0000 mg | ORAL_TABLET | Freq: Three times a day (TID) | ORAL | 0 refills | Status: DC | PRN

## 2018-02-04 NOTE — Telephone Encounter (Signed)
Sent in

## 2018-02-04 NOTE — Telephone Encounter (Signed)
[facility] [address] sent over request for tramadol  Last filled per database: 12/04/17 Last written: 12/03/17 Last ov: 01/07/18 Next ov: 04/09/18 Contract: 10/13/18 UDS: 07/09/18

## 2018-02-11 ENCOUNTER — Other Ambulatory Visit: Payer: Self-pay | Admitting: Family Medicine

## 2018-02-11 DIAGNOSIS — F988 Other specified behavioral and emotional disorders with onset usually occurring in childhood and adolescence: Secondary | ICD-10-CM

## 2018-02-12 MED ORDER — AMPHETAMINE-DEXTROAMPHETAMINE 30 MG PO TABS: 30.0000 mg | ORAL_TABLET | Freq: Two times a day (BID) | ORAL | 0 refills | Status: DC

## 2018-02-12 NOTE — Telephone Encounter (Signed)
Pt is requesting refill on Adderall.   Last OV: 01/07/2018 Last Fill: 11/08/2017 #180 and 0RF UDS: 01/07/2018 Low risk CSC: 10/12/2017

## 2018-02-14 ENCOUNTER — Other Ambulatory Visit: Payer: Self-pay | Admitting: *Deleted

## 2018-02-14 MED ORDER — ARIPIPRAZOLE 2 MG PO TABS: 4.0000 mg | ORAL_TABLET | Freq: Every day | ORAL | 1 refills | Status: DC

## 2018-02-19 ENCOUNTER — Encounter: Payer: Self-pay | Admitting: Family Medicine

## 2018-02-22 ENCOUNTER — Other Ambulatory Visit: Payer: Self-pay

## 2018-02-22 NOTE — Telephone Encounter (Signed)
Received fax from patient pharmacy requesting refill on Abilify. Refill just given on 02/14/18. Refill not appropriate. Fax shredded.

## 2018-03-20 ENCOUNTER — Encounter: Payer: Self-pay | Admitting: Family Medicine

## 2018-03-20 ENCOUNTER — Other Ambulatory Visit: Payer: Self-pay | Admitting: Family Medicine

## 2018-03-20 DIAGNOSIS — M797 Fibromyalgia: Secondary | ICD-10-CM

## 2018-03-20 DIAGNOSIS — Z1231 Encounter for screening mammogram for malignant neoplasm of breast: Secondary | ICD-10-CM

## 2018-03-20 DIAGNOSIS — G43809 Other migraine, not intractable, without status migrainosus: Secondary | ICD-10-CM

## 2018-03-21 MED ORDER — TRAMADOL HCL 50 MG PO TABS: 50.0000 mg | ORAL_TABLET | Freq: Three times a day (TID) | ORAL | 0 refills | Status: DC | PRN

## 2018-03-21 MED ORDER — HYDROCODONE-ACETAMINOPHEN 10-325 MG PO TABS: 1.0000 | ORAL_TABLET | Freq: Four times a day (QID) | ORAL | 0 refills | Status: DC | PRN

## 2018-03-21 NOTE — Telephone Encounter (Signed)
Last mammogram was 01/2017. Please advise. I have pended order.

## 2018-03-21 NOTE — Telephone Encounter (Signed)
Requesting:Tramadol and Hydrocodone Contract:10/12/17 UDS:01/07/18 low risk Last Visit:01/07/18 Next Visit:04/09/18 Last Refill:Tramadol 02/04/18 Hydrocodone 01/07/18  Please Advise  Database on desk for review.

## 2018-03-22 ENCOUNTER — Ambulatory Visit: Payer: BLUE CROSS/BLUE SHIELD | Admitting: Family Medicine

## 2018-03-22 ENCOUNTER — Encounter: Payer: Self-pay | Admitting: Family Medicine

## 2018-03-22 ENCOUNTER — Other Ambulatory Visit (HOSPITAL_COMMUNITY)
Admission: RE | Admit: 2018-03-22 | Discharge: 2018-03-22 | Disposition: A | Discharge status: Home / Self Care | Payer: BLUE CROSS/BLUE SHIELD | Source: Ambulatory Visit | Attending: Family Medicine | Admitting: Family Medicine

## 2018-03-22 VITALS — BP 125/64 | HR 85 | Temp 97.8°F | Resp 16 | Ht 66.93 in | Wt 279.8 lb

## 2018-03-22 DIAGNOSIS — Z23 Encounter for immunization: Secondary | ICD-10-CM

## 2018-03-22 DIAGNOSIS — H6504 Acute serous otitis media, recurrent, right ear: Secondary | ICD-10-CM | POA: Diagnosis not present

## 2018-03-22 DIAGNOSIS — N898 Other specified noninflammatory disorders of vagina: Secondary | ICD-10-CM

## 2018-03-22 MED ORDER — PREDNISONE 10 MG PO TABS: ORAL_TABLET | ORAL | 0 refills | Status: DC

## 2018-03-22 NOTE — Assessment & Plan Note (Signed)
pred taper claritin daily Use nasonex  Call or rto prn  Consider hctz for meniere

## 2018-03-22 NOTE — Progress Notes (Signed)
Patient ID: Jo LoboMichele Jackson, female    DOB: 1970/10/27  Age: 47 y.o. MRN: [id number]    Subjective:  Subjective  HPI Jo LoboMichele Jackson presents for roaring in her R ear and some congestion.  No fevers, no cough.  No wheezing.  She has a hx of meniere Prednisone has helped in the past  She has been taking claritin d with no relief.   She also c/o vaginal irritation but she is on her period-- she would like us to check urine for yeast/ bv.   She would also like her flu shot.     Review of Systems  Constitutional: Negative for chills and fever.  HENT: Positive for ear pain. Negative for congestion and hearing loss.   Eyes: Negative for discharge.  Respiratory: Negative for cough and shortness of breath.   Cardiovascular: Negative for chest pain, palpitations and leg swelling.  Gastrointestinal: Negative for abdominal pain, blood in stool, constipation, diarrhea, nausea and vomiting.  Genitourinary: Positive for vaginal discharge. Negative for dysuria, frequency, hematuria and urgency.  Musculoskeletal: Negative for back pain and myalgias.  Skin: Negative for rash.  Allergic/Immunologic: Negative for environmental allergies.  Neurological: Positive for dizziness. Negative for weakness and headaches.  Hematological: Does not bruise/bleed easily.  Psychiatric/Behavioral: Negative for suicidal ideas. The patient is not nervous/anxious.     History Past Medical History:  Diagnosis Date  . Abnormal Pap smear    Repeats WNL  . ADD (attention deficit disorder)   . Anxiety   . Arthritis    Oseteoarthritis  . Depression   . GERD (gastroesophageal reflux disease)   . Headache(784.0)    Migraines  . Junctional escape rhythm   . Neuromuscular disorder (HCC)    Fibromyalgia  . Pregnancy induced hypertension   . Supraventricular tachycardia (HCC)     She has a past surgical history that includes Appendectomy (1986); Carpal tunnel release (2000); Cervical fusion  (08-24-99,04-26-00,05-09-01); Shoulder surgery (left-6-04, right 09-12-04); tongue polyp (1984); Ablation of dysrhythmic focus; and Knee sugery (Left).   Her family history includes Bipolar disorder in her unknown relative; COPD in her mother; Cancer in her paternal aunt; Depression in her unknown relative; Diabetes in her unknown relative; Heart disease in her mother; Hodgkin's lymphoma in her father; Hypertension in her unknown relative; Thyroid disease in her unknown relative.She reports that she quit smoking about 10 years ago. She has never used smokeless tobacco. She reports that she drinks alcohol. She reports that she does not use drugs.  Current Outpatient Medications on File Prior to Visit  Medication Sig Dispense Refill  . amphetamine-dextroamphetamine (ADDERALL) 30 MG tablet Take 1 tablet by mouth 2 (two) times daily. 180 tablet 0  . ARIPiprazole (ABILIFY) 2 MG tablet Take 2 tablets (4 mg total) by mouth daily. 90 tablet 1  . cefdinir (OMNICEF) 300 MG capsule Take 1 capsule (300 mg total) by mouth 2 (two) times daily. 20 capsule 0  . Diclofenac Sodium (PENNSAID) 2 % SOLN 4 g qid prn 112 g 5  . doxycycline (VIBRA-TABS) 100 MG tablet Take 1 tablet (100 mg total) by mouth 2 (two) times daily. 20 tablet 0  . HYDROcodone-acetaminophen (NORCO) 10-325 MG tablet Take 1 tablet by mouth every 6 (six) hours as needed. 30 tablet 0  . Levomilnacipran HCl ER 40 MG CP24 Take 1 capsule by mouth daily. 90 capsule 1  . mometasone (NASONEX) 50 MCG/ACT nasal spray Place 2 sprays daily into the nose. 17 g 12  . norethindrone (MICRONOR,CAMILA,ERRIN) 0.35 MG tablet  Take 1 tablet by mouth daily.    . ondansetron (ZOFRAN-ODT) 8 MG disintegrating tablet TAKE 1 TABLET (8 MG TOTAL) BY MOUTH EVERY 8 (EIGHT) HOURS AS NEEDED FOR NAUSEA OR VOMITING 20 tablet 1  . PROAIR HFA 108 (90 Base) MCG/ACT inhaler INHALE 2 PUFFS INTO LUNGS EVERY 6 HOURS AS NEEDED FOR WHEEZING OR SHORTNESS OF BREATH 8.5 Inhaler 2  . SUMAtriptan  (IMITREX) 50 MG tablet TAKE 1 TABLET BY MOUTH ONCE, MAY REPEAT IN 2 HOURS IF HEADACHE PERSISTS OR RECURS 10 tablet 2  . tiZANidine (ZANAFLEX) 4 MG tablet TAKE 1 TABLET BY MOUTH EVERY 6 HOURS AS NEEDED FOR MUSCLE SPASMS. 30 tablet 1  . traMADol (ULTRAM) 50 MG tablet Take 1 tablet (50 mg total) by mouth every 8 (eight) hours as needed. 30 tablet 0  . TRINTELLIX 20 MG TABS tablet TAKE 1 TABLET BY MOUTH EVERY DAY 90 tablet 1  . valACYclovir (VALTREX) 1000 MG tablet TAKE 1 TABLET (1,000 MG TOTAL) BY MOUTH 3 (THREE) TIMES DAILY. 30 tablet 5   No current facility-administered medications on file prior to visit.      Objective:  Objective  Physical Exam  Constitutional: She is oriented to person, place, and time. She appears well-developed and well-nourished.  HENT:  Head: Normocephalic and atraumatic.  Right Ear: A middle ear effusion is present.  Left Ear: Hearing, tympanic membrane, external ear and ear canal normal.  Eyes: Conjunctivae and EOM are normal.  Neck: Normal range of motion. Neck supple. No JVD present. Carotid bruit is not present. No thyromegaly present.  Cardiovascular: Normal rate, regular rhythm and normal heart sounds.  No murmur heard. Pulmonary/Chest: Effort normal and breath sounds normal. No respiratory distress. She has no wheezes. She has no rales. She exhibits no tenderness.  Musculoskeletal: She exhibits no edema.  Neurological: She is alert and oriented to person, place, and time.  Psychiatric: She has a normal mood and affect.  Nursing note and vitals reviewed.  BP 125/64 (BP Location: Right Arm, Cuff Size: Normal)   Pulse 85   Temp 97.8 F (36.6 C) (Oral)   Resp 16   Ht 5' 6.93" (1.7 m)   Wt 279 lb 12.8 oz (126.9 kg)   LMP 03/22/2018   SpO2 98%   BMI 43.91 kg/m  Wt Readings from Last 3 Encounters:  03/22/18 279 lb 12.8 oz (126.9 kg)  01/07/18 275 lb 3.2 oz (124.8 kg)  10/12/17 279 lb 12.8 oz (126.9 kg)     Lab Results  Component Value Date   WBC  5.2 02/12/2017   HGB 14.0 02/12/2017   HCT 41.1 02/12/2017   PLT 284.0 02/12/2017   GLUCOSE 99 02/12/2017   CHOL 174 02/12/2017   TRIG 58.0 02/12/2017   HDL 72.20 02/12/2017   LDLCALC 91 02/12/2017   ALT 14 02/12/2017   AST 17 02/12/2017   NA 139 02/12/2017   K 4.5 02/12/2017   CL 102 02/12/2017   CREATININE 0.85 02/12/2017   BUN 10 02/12/2017   CO2 32 02/12/2017   TSH 1.98 02/12/2017   HGBA1C 5.4 06/06/2013    US Transvaginal Non-ob  Result Date: 02/20/2017 CLINICAL DATA:  46 year old female with heavy cycles x3 months. LMP: 02/01/2017 EXAM: TRANSABDOMINAL AND TRANSVAGINAL ULTRASOUND OF PELVIS TECHNIQUE: Both transabdominal and transvaginal ultrasound examinations of the pelvis were performed. Transabdominal technique was performed for global imaging of the pelvis including uterus, ovaries, adnexal regions, and pelvic cul-de-sac. It was necessary to proceed with endovaginal exam following the transabdominal  exam to visualize the endometrium and the ovaries. COMPARISON:  None FINDINGS: Uterus Measurements: 8.2 x 5.2 x 5.3 cm. No fibroids or other mass visualized. Endometrium Thickness: 18 mm. The thickness of the endometrium likely related to secretory phase of the menstrual cycle. No focal abnormality visualized. Right ovary Measurements: 3.4 x 2.8 x 2.2 cm. The ovaries grossly unremarkable as visualized. Left ovary Measurements: 3.0 x 1.6 x 2.0 cm. The left ovary is grossly unremarkable as visualized. Other findings No abnormal free fluid. IMPRESSION: Unremarkable pelvic ultrasound. Electronically Signed   By: Elgie Collard M.D.   On: 02/20/2017 19:11   US Pelvis Complete  Result Date: 02/20/2017 CLINICAL DATA:  47 year old female with heavy cycles x3 months. LMP: 02/01/2017 EXAM: TRANSABDOMINAL AND TRANSVAGINAL ULTRASOUND OF PELVIS TECHNIQUE: Both transabdominal and transvaginal ultrasound examinations of the pelvis were performed. Transabdominal technique was performed for global  imaging of the pelvis including uterus, ovaries, adnexal regions, and pelvic cul-de-sac. It was necessary to proceed with endovaginal exam following the transabdominal exam to visualize the endometrium and the ovaries. COMPARISON:  None FINDINGS: Uterus Measurements: 8.2 x 5.2 x 5.3 cm. No fibroids or other mass visualized. Endometrium Thickness: 18 mm. The thickness of the endometrium likely related to secretory phase of the menstrual cycle. No focal abnormality visualized. Right ovary Measurements: 3.4 x 2.8 x 2.2 cm. The ovaries grossly unremarkable as visualized. Left ovary Measurements: 3.0 x 1.6 x 2.0 cm. The left ovary is grossly unremarkable as visualized. Other findings No abnormal free fluid. IMPRESSION: Unremarkable pelvic ultrasound. Electronically Signed   By: Elgie Collard M.D.   On: 02/20/2017 19:11   Mm Screening Breast Tomo Bilateral  Result Date: 02/21/2017 CLINICAL DATA:  Screening. EXAM: 2D DIGITAL SCREENING BILATERAL MAMMOGRAM WITH CAD AND ADJUNCT TOMO COMPARISON:  Previous exam(s). ACR Breast Density Category b: There are scattered areas of fibroglandular density. FINDINGS: There are no findings suspicious for malignancy. Images were processed with CAD. IMPRESSION: No mammographic evidence of malignancy. A result letter of this screening mammogram will be mailed directly to the patient. RECOMMENDATION: Screening mammogram in one year. (Code:SM-B-01Y) BI-RADS CATEGORY  1: Negative. Electronically Signed   By: Bary Richard M.D.   On: 02/21/2017 11:05     Assessment & Plan:  Plan  I am having Jo Jackson start on predniSONE. I am also having her maintain her SUMAtriptan, valACYclovir, PROAIR HFA, mometasone, Diclofenac Sodium, norethindrone, cefdinir, TRINTELLIX, Levomilnacipran HCl ER, doxycycline, tiZANidine, ondansetron, amphetamine-dextroamphetamine, ARIPiprazole, HYDROcodone-acetaminophen, and traMADol.  Meds ordered this encounter  Medications  . predniSONE (DELTASONE)  10 MG tablet    Sig: TAKE 3 TABLETS PO QD FOR 3 DAYS THEN TAKE 2 TABLETS PO QD FOR 3 DAYS THEN TAKE 1 TABLET PO QD FOR 3 DAYS THEN TAKE 1/2 TAB PO QD FOR 3 DAYS    Dispense:  20 tablet    Refill:  0    Problem List Items Addressed This Visit      Unprioritized   Recurrent acute serous otitis media of right ear    pred taper claritin daily Use nasonex  Call or rto prn  Consider hctz for meniere      Relevant Medications   predniSONE (DELTASONE) 10 MG tablet   Vaginal irritation - Primary    Pt is on her period --- for 2 weeks now She will call gyn Check urine ancillary today Use otc monistat      Relevant Orders   Urine cytology ancillary only    Other Visit Diagnoses  Need for influenza vaccination       Relevant Orders   Flu Vaccine QUAD 6+ mos PF IM (Fluarix Quad PF) (Completed)      Follow-up: Return if symptoms worsen or fail to improve.  [name], DO

## 2018-03-22 NOTE — Assessment & Plan Note (Addendum)
Pt is on her period --- for 2 weeks now She will call gyn Check urine ancillary today Use otc monistat

## 2018-03-22 NOTE — Patient Instructions (Signed)

## 2018-03-26 LAB — URINE CYTOLOGY ANCILLARY ONLY
Bacterial vaginitis: NEGATIVE
Candida vaginitis: NEGATIVE

## 2018-04-01 ENCOUNTER — Ambulatory Visit (HOSPITAL_BASED_OUTPATIENT_CLINIC_OR_DEPARTMENT_OTHER)
Admission: RE | Admit: 2018-04-01 | Discharge: 2018-04-01 | Disposition: A | Discharge status: Home / Self Care | Payer: BLUE CROSS/BLUE SHIELD | Source: Ambulatory Visit | Attending: Family Medicine | Admitting: Family Medicine

## 2018-04-01 DIAGNOSIS — Z1231 Encounter for screening mammogram for malignant neoplasm of breast: Secondary | ICD-10-CM | POA: Diagnosis not present

## 2018-04-02 ENCOUNTER — Other Ambulatory Visit: Payer: Self-pay | Admitting: Family Medicine

## 2018-04-02 DIAGNOSIS — R928 Other abnormal and inconclusive findings on diagnostic imaging of breast: Secondary | ICD-10-CM

## 2018-04-02 DIAGNOSIS — J208 Acute bronchitis due to other specified organisms: Secondary | ICD-10-CM

## 2018-04-03 ENCOUNTER — Ambulatory Visit: Payer: Self-pay

## 2018-04-03 ENCOUNTER — Telehealth: Payer: Self-pay | Admitting: *Deleted

## 2018-04-03 NOTE — Telephone Encounter (Signed)
Received Physician Orders from [facility]; forwarded to provider/SLS 09/11  

## 2018-04-05 ENCOUNTER — Other Ambulatory Visit: Payer: Self-pay | Admitting: Family Medicine

## 2018-04-05 DIAGNOSIS — R110 Nausea: Secondary | ICD-10-CM

## 2018-04-05 DIAGNOSIS — M62838 Other muscle spasm: Secondary | ICD-10-CM

## 2018-04-06 ENCOUNTER — Encounter: Payer: Self-pay | Admitting: Family Medicine

## 2018-04-08 ENCOUNTER — Other Ambulatory Visit: Payer: Self-pay | Admitting: Family Medicine

## 2018-04-08 DIAGNOSIS — K219 Gastro-esophageal reflux disease without esophagitis: Secondary | ICD-10-CM

## 2018-04-08 MED ORDER — PANTOPRAZOLE SODIUM 40 MG PO TBEC: 40.0000 mg | DELAYED_RELEASE_TABLET | Freq: Every day | ORAL | 3 refills | Status: DC

## 2018-04-08 NOTE — Telephone Encounter (Signed)
I have sent protonix to the pharmacy I have heard that about zantac but I've changed it

## 2018-04-09 ENCOUNTER — Ambulatory Visit
Admission: RE | Admit: 2018-04-09 | Discharge: 2018-04-09 | Disposition: A | Discharge status: Home / Self Care | Payer: BLUE CROSS/BLUE SHIELD | Source: Ambulatory Visit | Attending: Family Medicine | Admitting: Family Medicine

## 2018-04-09 ENCOUNTER — Ambulatory Visit: Payer: BLUE CROSS/BLUE SHIELD | Admitting: Family Medicine

## 2018-04-09 DIAGNOSIS — R928 Other abnormal and inconclusive findings on diagnostic imaging of breast: Secondary | ICD-10-CM

## 2018-04-13 ENCOUNTER — Encounter: Payer: Self-pay | Admitting: Family Medicine

## 2018-04-13 ENCOUNTER — Other Ambulatory Visit: Payer: Self-pay | Admitting: Family Medicine

## 2018-04-13 DIAGNOSIS — R110 Nausea: Secondary | ICD-10-CM

## 2018-04-13 DIAGNOSIS — M62838 Other muscle spasm: Secondary | ICD-10-CM

## 2018-04-15 ENCOUNTER — Encounter: Payer: Self-pay | Admitting: Family Medicine

## 2018-04-15 ENCOUNTER — Ambulatory Visit: Payer: BLUE CROSS/BLUE SHIELD | Admitting: Family Medicine

## 2018-04-15 ENCOUNTER — Other Ambulatory Visit: Payer: Self-pay | Admitting: *Deleted

## 2018-04-15 VITALS — BP 126/86 | Temp 99.4°F | Resp 16 | Ht 67.0 in | Wt 281.4 lb

## 2018-04-15 DIAGNOSIS — J208 Acute bronchitis due to other specified organisms: Secondary | ICD-10-CM

## 2018-04-15 DIAGNOSIS — F321 Major depressive disorder, single episode, moderate: Secondary | ICD-10-CM

## 2018-04-15 DIAGNOSIS — J111 Influenza due to unidentified influenza virus with other respiratory manifestations: Secondary | ICD-10-CM

## 2018-04-15 DIAGNOSIS — M797 Fibromyalgia: Secondary | ICD-10-CM

## 2018-04-15 DIAGNOSIS — R6883 Chills (without fever): Secondary | ICD-10-CM

## 2018-04-15 LAB — POC INFLUENZA A&B (BINAX/QUICKVUE)
INFLUENZA A, POC: NEGATIVE
Influenza B, POC: NEGATIVE

## 2018-04-15 MED ORDER — ALBUTEROL SULFATE HFA 108 (90 BASE) MCG/ACT IN AERS: INHALATION_SPRAY | RESPIRATORY_TRACT | 2 refills | Status: DC

## 2018-04-15 MED ORDER — OSELTAMIVIR PHOSPHATE 75 MG PO CAPS: 75.0000 mg | ORAL_CAPSULE | Freq: Two times a day (BID) | ORAL | 0 refills | Status: DC

## 2018-04-15 MED ORDER — LEVOMILNACIPRAN HCL ER 40 MG PO CP24: ORAL_CAPSULE | ORAL | 3 refills | Status: DC

## 2018-04-15 NOTE — Progress Notes (Signed)
Patient ID: Jo Jackson    DOB: [birth year]  Age: 47 y.o. MRN: [id number]    Subjective:  Subjective  HPI Jo Jackson presents for flu like symptoms - since this am =----+ fever   Pt taking mucinex, claritin and nasonex.  Symptoms started suddenly about 1 am this am.  + bodyaches  Review of Systems  Constitutional: Negative for chills and fever.  HENT: Positive for congestion and rhinorrhea. Negative for hearing loss.   Eyes: Negative for discharge.  Respiratory: Negative for cough and shortness of breath.   Cardiovascular: Negative for chest pain, palpitations and leg swelling.  Gastrointestinal: Negative for abdominal pain, blood in stool, constipation, diarrhea, nausea and vomiting.  Genitourinary: Negative for dysuria, frequency, hematuria and urgency.  Musculoskeletal: Positive for myalgias. Negative for back pain.  Skin: Negative for rash.  Allergic/Immunologic: Negative for environmental allergies.  Neurological: Negative for dizziness, weakness and headaches.  Hematological: Does not bruise/bleed easily.  Psychiatric/Behavioral: Negative for suicidal ideas. The patient is not nervous/anxious.     History Past Medical History:  Diagnosis Date  . Abnormal Pap smear    Repeats WNL  . ADD (attention deficit disorder)   . Anxiety   . Arthritis    Oseteoarthritis  . Depression   . GERD (gastroesophageal reflux disease)   . Headache(784.0)    Migraines  . Junctional escape rhythm   . Neuromuscular disorder (HCC)    Fibromyalgia  . Pregnancy induced hypertension   . Supraventricular tachycardia (HCC)     She has a past surgical history that includes Appendectomy (1986); Carpal tunnel release (2000); Cervical fusion (08-24-99,04-26-00,05-09-01); Shoulder surgery (left-6-04, right 09-12-04); tongue polyp (1984); Ablation of dysrhythmic focus; and Knee sugery (Left).   Her family history includes Bipolar disorder in her unknown relative; COPD in her mother;  Cancer in her paternal aunt; Depression in her unknown relative; Diabetes in her unknown relative; Heart disease in her mother; Hodgkin's lymphoma in her father; Hypertension in her unknown relative; Thyroid disease in her unknown relative.She reports that she quit smoking about 10 years ago. She has never used smokeless tobacco. She reports that she drinks alcohol. She reports that she does not use drugs.  Current Outpatient Medications on File Prior to Visit  Medication Sig Dispense Refill  . amphetamine-dextroamphetamine (ADDERALL) 30 MG tablet Take 1 tablet by mouth 2 (two) times daily. 180 tablet 0  . ARIPiprazole (ABILIFY) 2 MG tablet Take 2 tablets (4 mg total) by mouth daily. 90 tablet 1  . cefdinir (OMNICEF) 300 MG capsule Take 1 capsule (300 mg total) by mouth 2 (two) times daily. 20 capsule 0  . Diclofenac Sodium (PENNSAID) 2 % SOLN 4 g qid prn 112 g 5  . doxycycline (VIBRA-TABS) 100 MG tablet Take 1 tablet (100 mg total) by mouth 2 (two) times daily. 20 tablet 0  . HYDROcodone-acetaminophen (NORCO) 10-325 MG tablet Take 1 tablet by mouth every 6 (six) hours as needed. 30 tablet 0  . Levomilnacipran HCl ER 40 MG CP24 Take 1 capsule by mouth daily. 90 capsule 1  . mometasone (NASONEX) 50 MCG/ACT nasal spray Place 2 sprays daily into the nose. 17 g 12  . norethindrone (MICRONOR,CAMILA,ERRIN) 0.35 MG tablet Take 1 tablet by mouth daily.    . ondansetron (ZOFRAN-ODT) 8 MG disintegrating tablet TAKE 1 TABLET (8 MG TOTAL) BY MOUTH EVERY 8 (EIGHT) HOURS AS NEEDED FOR NAUSEA OR VOMITING 20 tablet 1  . ondansetron (ZOFRAN-ODT) 8 MG disintegrating tablet TAKE 1 TABLET (8 MG TOTAL)  BY MOUTH EVERY 8 (EIGHT) HOURS AS NEEDED FOR NAUSEA OR VOMITING 20 tablet 1  . pantoprazole (PROTONIX) 40 MG tablet Take 1 tablet (40 mg total) by mouth daily. 30 tablet 3  . predniSONE (DELTASONE) 10 MG tablet TAKE 3 TABLETS PO QD FOR 3 DAYS THEN TAKE 2 TABLETS PO QD FOR 3 DAYS THEN TAKE 1 TABLET PO QD FOR 3 DAYS THEN  TAKE 1/2 TAB PO QD FOR 3 DAYS 20 tablet 0  . PROAIR HFA 108 (90 Base) MCG/ACT inhaler INHALE 2 PUFFS INTO LUNGS EVERY 6 HOURS AS NEEDED FOR WHEEZING OR SHORTNESS OF BREATH 8.5 Inhaler 2  . SUMAtriptan (IMITREX) 50 MG tablet TAKE 1 TABLET BY MOUTH ONCE, MAY REPEAT IN 2 HOURS IF HEADACHE PERSISTS OR RECURS 10 tablet 2  . tiZANidine (ZANAFLEX) 4 MG tablet TAKE 1 TABLET BY MOUTH EVERY 6 HOURS AS NEEDED FOR MUSCLE SPASMS. 30 tablet 1  . tiZANidine (ZANAFLEX) 4 MG tablet TAKE 1 TABLET BY MOUTH EVERY 6 HOURS AS NEEDED FOR MUSCLE SPASMS. 30 tablet 1  . traMADol (ULTRAM) 50 MG tablet Take 1 tablet (50 mg total) by mouth every 8 (eight) hours as needed. 30 tablet 0  . valACYclovir (VALTREX) 1000 MG tablet TAKE 1 TABLET (1,000 MG TOTAL) BY MOUTH 3 (THREE) TIMES DAILY. 30 tablet 5   No current facility-administered medications on file prior to visit.      Objective:  Objective  Physical Exam  Constitutional: She is oriented to person, place, and time. She appears well-developed and well-nourished.  HENT:  Head: Normocephalic and atraumatic.  Nose: Rhinorrhea present.  Mouth/Throat: Posterior oropharyngeal erythema present.  Eyes: Conjunctivae and EOM are normal.  Neck: Normal range of motion. Neck supple. No JVD present. Carotid bruit is not present. No thyromegaly present.  Cardiovascular: Normal rate, regular rhythm and normal heart sounds.  No murmur heard. Pulmonary/Chest: Effort normal and breath sounds normal. No respiratory distress. She has no wheezes. She has no rales. She exhibits no tenderness.  Musculoskeletal: She exhibits no edema.  Neurological: She is alert and oriented to person, place, and time.  Psychiatric: She has a normal mood and affect.  Nursing note and vitals reviewed.  BP 126/86 (BP Location: Right Arm, Cuff Size: Large)   Temp 99.4 F (37.4 C) (Oral)   Resp 16   Ht 5\' 7"  (1.702 m)   Wt 281 lb 6.4 oz (127.6 kg)   LMP 03/22/2018   BMI 44.07 kg/m  Wt Readings from  Last 3 Encounters:  04/15/18 281 lb 6.4 oz (127.6 kg)  03/22/18 279 lb 12.8 oz (126.9 kg)  01/07/18 275 lb 3.2 oz (124.8 kg)     Lab Results  Component Value Date   WBC 5.2 02/12/2017   HGB 14.0 02/12/2017   HCT 41.1 02/12/2017   PLT 284.0 02/12/2017   GLUCOSE 99 02/12/2017   CHOL 174 02/12/2017   TRIG 58.0 02/12/2017   HDL 72.20 02/12/2017   LDLCALC 91 02/12/2017   ALT 14 02/12/2017   AST 17 02/12/2017   NA 139 02/12/2017   K 4.5 02/12/2017   CL 102 02/12/2017   CREATININE 0.85 02/12/2017   BUN 10 02/12/2017   CO2 32 02/12/2017   TSH 1.98 02/12/2017   HGBA1C 5.4 06/06/2013    US Breast Ltd Uni Left Inc Axilla  Result Date: 04/09/2018 CLINICAL DATA:  Screening recall for a left breast asymmetry. EXAM: DIGITAL DIAGNOSTIC LEFT MAMMOGRAM WITH CAD AND TOMO ULTRASOUND LEFT BREAST COMPARISON:  Previous exam(s). ACR  Breast Density Category c: The breast tissue is heterogeneously dense, which may obscure small masses. FINDINGS: Spot compression tomosynthesis images through the area of concern in the superior anterior left breast demonstrates no definite persistent asymmetry. However, due to the density of breast tissue in this region, ultrasound will be performed for further evaluation. Mammographic images were processed with CAD. Ultrasound targeted to the superior left breast at 12 o'clock demonstrates normal fibroglandular tissue. No suspicious masses or areas of shadowing are identified. IMPRESSION: Resolution of the left breast asymmetry consistent with overlapping fibroglandular tissue. RECOMMENDATION: Screening mammogram in one year.(Code:SM-B-01Y) I have discussed the findings and recommendations with the patient. Results were also provided in writing at the conclusion of the visit. If applicable, a reminder letter will be sent to the patient regarding the next appointment. BI-RADS CATEGORY  1: Negative. Electronically Signed   By: Frederico Hamman M.D.   On: 04/09/2018 09:58   Mm  Diag Breast Tomo Uni Left  Result Date: 04/09/2018 CLINICAL DATA:  Screening recall for a left breast asymmetry. EXAM: DIGITAL DIAGNOSTIC LEFT MAMMOGRAM WITH CAD AND TOMO ULTRASOUND LEFT BREAST COMPARISON:  Previous exam(s). ACR Breast Density Category c: The breast tissue is heterogeneously dense, which may obscure small masses. FINDINGS: Spot compression tomosynthesis images through the area of concern in the superior anterior left breast demonstrates no definite persistent asymmetry. However, due to the density of breast tissue in this region, ultrasound will be performed for further evaluation. Mammographic images were processed with CAD. Ultrasound targeted to the superior left breast at 12 o'clock demonstrates normal fibroglandular tissue. No suspicious masses or areas of shadowing are identified. IMPRESSION: Resolution of the left breast asymmetry consistent with overlapping fibroglandular tissue. RECOMMENDATION: Screening mammogram in one year.(Code:SM-B-01Y) I have discussed the findings and recommendations with the patient. Results were also provided in writing at the conclusion of the visit. If applicable, a reminder letter will be sent to the patient regarding the next appointment. BI-RADS CATEGORY  1: Negative. Electronically Signed   By: Frederico Hamman M.D.   On: 04/09/2018 09:58     Assessment & Plan:  Plan  I have discontinued Shealynn Trentman's TRINTELLIX. I am also having her start on Levomilnacipran HCl ER and oseltamivir. Additionally, I am having her maintain her SUMAtriptan, valACYclovir, mometasone, Diclofenac Sodium, norethindrone, cefdinir, Levomilnacipran HCl ER, doxycycline, tiZANidine, ondansetron, amphetamine-dextroamphetamine, ARIPiprazole, HYDROcodone-acetaminophen, traMADol, predniSONE, PROAIR HFA, tiZANidine, ondansetron, and pantoprazole.  Meds ordered this encounter  Medications  . Levomilnacipran HCl ER (FETZIMA) 40 MG CP24    Sig: 1 po qd    Dispense:  90  capsule    Refill:  3  . oseltamivir (TAMIFLU) 75 MG capsule    Sig: Take 1 capsule (75 mg total) by mouth 2 (two) times daily.    Dispense:  10 capsule    Refill:  0    Problem List Items Addressed This Visit      Unprioritized   Fibromyalgia    Stable       Influenza - Primary    tamiflu  con't mucinex, claritin and nasonex  rto prn       Relevant Medications   oseltamivir (TAMIFLU) 75 MG capsule    Other Visit Diagnoses    Chills       Relevant Orders   POC Influenza A&B (Binax test) (Completed)   Depression, major, single episode, moderate (HCC)       Relevant Medications   Levomilnacipran HCl ER (FETZIMA) 40 MG CP24  Follow-up: Return in about 3 months (around 07/15/2018), or if symptoms worsen or fail to improve.  [name], DO

## 2018-04-15 NOTE — Patient Instructions (Signed)

## 2018-04-15 NOTE — Telephone Encounter (Signed)
Rx refilled today during OV.

## 2018-04-15 NOTE — Assessment & Plan Note (Signed)
Stable

## 2018-04-15 NOTE — Assessment & Plan Note (Signed)
tamiflu  con't mucinex, claritin and nasonex  rto prn

## 2018-04-22 ENCOUNTER — Ambulatory Visit: Payer: BLUE CROSS/BLUE SHIELD | Admitting: Family Medicine

## 2018-05-02 ENCOUNTER — Telehealth: Payer: Self-pay | Admitting: *Deleted

## 2018-05-02 NOTE — Telephone Encounter (Signed)
Received FMLA/STD paperwork from [facility], completed as much as possible; forwarded to provider/SLS 10/10

## 2018-05-10 ENCOUNTER — Other Ambulatory Visit: Payer: Self-pay | Admitting: Family Medicine

## 2018-05-10 DIAGNOSIS — G43809 Other migraine, not intractable, without status migrainosus: Secondary | ICD-10-CM

## 2018-05-10 DIAGNOSIS — M797 Fibromyalgia: Secondary | ICD-10-CM

## 2018-05-10 DIAGNOSIS — F988 Other specified behavioral and emotional disorders with onset usually occurring in childhood and adolescence: Secondary | ICD-10-CM

## 2018-05-10 MED ORDER — AMPHETAMINE-DEXTROAMPHETAMINE 30 MG PO TABS: 30.0000 mg | ORAL_TABLET | Freq: Two times a day (BID) | ORAL | 0 refills | Status: DC

## 2018-05-10 MED ORDER — TRAMADOL HCL 50 MG PO TABS: 50.0000 mg | ORAL_TABLET | Freq: Three times a day (TID) | ORAL | 0 refills | Status: DC | PRN

## 2018-05-10 MED ORDER — HYDROCODONE-ACETAMINOPHEN 10-325 MG PO TABS: 1.0000 | ORAL_TABLET | Freq: Four times a day (QID) | ORAL | 0 refills | Status: DC | PRN

## 2018-05-10 NOTE — Telephone Encounter (Signed)
Paperwork faxed with confirmation, copy mailed in [employer] patient supplied for [employer] and also, copy mailed to patient as requested/SLS 10/17

## 2018-05-10 NOTE — Telephone Encounter (Signed)
Pt is requesting refill on Adderall, hydrocodone, and tramadol  Last OV; 04/15/2018 Last Fill on Adderall; 02/12/2018 #180 and 0RF Last Fill on hydrocodone: 03/21/2018 #30 and 0RF Last Fill on tramadol: 03/21/2018 #30 and 0RF UDS: 01/07/2018 Low risk

## 2018-05-14 ENCOUNTER — Encounter: Payer: Self-pay | Admitting: Family Medicine

## 2018-05-14 NOTE — Telephone Encounter (Signed)
Is it because she is due for a pap---?  If yes we can refill 1 but she needs to schedule pap

## 2018-05-17 MED ORDER — NORETHINDRONE 0.35 MG PO TABS: 1.0000 | ORAL_TABLET | Freq: Every day | ORAL | 3 refills | Status: DC

## 2018-05-18 ENCOUNTER — Other Ambulatory Visit: Payer: Self-pay | Admitting: Family Medicine

## 2018-05-29 ENCOUNTER — Other Ambulatory Visit: Payer: Self-pay | Admitting: Family Medicine

## 2018-05-29 ENCOUNTER — Encounter: Payer: Self-pay | Admitting: Family Medicine

## 2018-05-29 DIAGNOSIS — F418 Other specified anxiety disorders: Secondary | ICD-10-CM

## 2018-05-29 MED ORDER — LEVOMILNACIPRAN HCL ER 80 MG PO CP24: ORAL_CAPSULE | ORAL | 3 refills | Status: DC

## 2018-07-07 ENCOUNTER — Other Ambulatory Visit: Payer: Self-pay | Admitting: Family Medicine

## 2018-07-07 DIAGNOSIS — K219 Gastro-esophageal reflux disease without esophagitis: Secondary | ICD-10-CM

## 2018-07-08 ENCOUNTER — Other Ambulatory Visit: Payer: Self-pay | Admitting: Family Medicine

## 2018-07-08 DIAGNOSIS — G43809 Other migraine, not intractable, without status migrainosus: Secondary | ICD-10-CM

## 2018-07-08 DIAGNOSIS — R110 Nausea: Secondary | ICD-10-CM

## 2018-07-08 DIAGNOSIS — M797 Fibromyalgia: Secondary | ICD-10-CM

## 2018-07-08 DIAGNOSIS — M62838 Other muscle spasm: Secondary | ICD-10-CM

## 2018-07-10 NOTE — Telephone Encounter (Signed)
Database ran and is on your desk for review.  Last filled per database: 05/10/18 Last written: 05/10/18 Last ov: 04/15/18 Next ov: none Contract: 10/13/18 UDS: due

## 2018-07-10 NOTE — Telephone Encounter (Signed)
Database ran and is on your desk for review.  Last filled per database: 05/10/18 Last written: 05/10/18 Last ov: 04/15/18 Next ov: none Contract: 10/13/18 UDS: due  

## 2018-07-11 MED ORDER — HYDROCODONE-ACETAMINOPHEN 10-325 MG PO TABS: 1.0000 | ORAL_TABLET | Freq: Four times a day (QID) | ORAL | 0 refills | Status: DC | PRN

## 2018-07-12 ENCOUNTER — Other Ambulatory Visit: Payer: Self-pay | Admitting: Family Medicine

## 2018-07-12 DIAGNOSIS — M62838 Other muscle spasm: Secondary | ICD-10-CM

## 2018-07-12 DIAGNOSIS — R110 Nausea: Secondary | ICD-10-CM

## 2018-07-19 ENCOUNTER — Other Ambulatory Visit: Payer: Self-pay | Admitting: Family Medicine

## 2018-07-19 DIAGNOSIS — J069 Acute upper respiratory infection, unspecified: Secondary | ICD-10-CM

## 2018-08-11 ENCOUNTER — Other Ambulatory Visit: Payer: Self-pay | Admitting: Family Medicine

## 2018-08-16 ENCOUNTER — Ambulatory Visit: Payer: BLUE CROSS/BLUE SHIELD | Admitting: Family Medicine

## 2018-08-16 ENCOUNTER — Encounter: Payer: Self-pay | Admitting: Family Medicine

## 2018-08-16 VITALS — BP 136/86 | HR 86 | Temp 98.0°F | Resp 16 | Ht 67.0 in | Wt 293.0 lb

## 2018-08-16 DIAGNOSIS — G43809 Other migraine, not intractable, without status migrainosus: Secondary | ICD-10-CM | POA: Diagnosis not present

## 2018-08-16 DIAGNOSIS — F339 Major depressive disorder, recurrent, unspecified: Secondary | ICD-10-CM

## 2018-08-16 DIAGNOSIS — F988 Other specified behavioral and emotional disorders with onset usually occurring in childhood and adolescence: Secondary | ICD-10-CM

## 2018-08-16 DIAGNOSIS — M797 Fibromyalgia: Secondary | ICD-10-CM

## 2018-08-16 DIAGNOSIS — Z79899 Other long term (current) drug therapy: Secondary | ICD-10-CM

## 2018-08-16 MED ORDER — HYDROCODONE-ACETAMINOPHEN 10-325 MG PO TABS: 1.0000 | ORAL_TABLET | Freq: Four times a day (QID) | ORAL | 0 refills | Status: DC | PRN

## 2018-08-16 MED ORDER — ARIPIPRAZOLE 5 MG PO TABS: 5.0000 mg | ORAL_TABLET | Freq: Every day | ORAL | 2 refills | Status: DC

## 2018-08-16 MED ORDER — TRAMADOL HCL 50 MG PO TABS: 50.0000 mg | ORAL_TABLET | Freq: Three times a day (TID) | ORAL | 0 refills | Status: DC | PRN

## 2018-08-16 MED ORDER — AMPHETAMINE-DEXTROAMPHETAMINE 30 MG PO TABS: 30.0000 mg | ORAL_TABLET | Freq: Two times a day (BID) | ORAL | 0 refills | Status: DC

## 2018-08-16 NOTE — Patient Instructions (Signed)
Attention Deficit Hyperactivity Disorder, Adult Attention deficit hyperactivity disorder (ADHD) is a mental health disorder that starts during childhood. For many people with ADHD, the disorder continues into adult years. There are many things that you and your health care provider or therapist (mental health professional) can do to manage your symptoms. What are the causes? The exact cause of ADHD is not known. What increases the risk? You are more likely to develop this condition if:  You have a family history of ADHD.  You are female.  You were born to a mother who smoked or drank alcohol during pregnancy.  You were exposed to lead poisoning or other toxins in the womb or in early life.  You were born before 37 weeks of pregnancy (prematurely) or you had a low birth weight.  You have experienced a brain injury. What are the signs or symptoms? Symptoms of this condition depend on the type of ADHD. The two main types are inattentive and hyperactive-impulsive. Some people may have symptoms of both types. Symptoms of the inattentive type include:  Difficulty watching, listening, or thinking with focused effort (paying attention).  Making careless mistakes.  Not listening.  Not following instructions.  Being disorganized.  Avoiding tasks that require time and attention.  Losing things.  Forgetting things.  Being easily distracted. Symptoms of the hyperactive-impulsive type include:  Restlessness.  Talking too much.  Interrupting.  Difficulty with: ? Sitting still. ? Staying quiet. ? Feeling motivated. ? Relaxing. ? Waiting in line or waiting for a turn. How is this diagnosed? This condition is diagnosed based on your current symptoms and your history of symptoms. The diagnosis can be made by a provider such as a primary care provider, psychiatrist, psychologist, or clinical social worker. The provider may use a symptom checklist or a standardized behavior rating  scale to evaluate your symptoms. He or she may want to talk with family members who have known you for a long time and have observed your behaviors. There are no lab tests or brain imaging tests that can diagnose ADHD. How is this treated? This condition can be treated with medicines and behavior therapy. Medicines may be the best option to reduce impulsive behaviors and improve attention. Your health care provider may recommend:  Stimulant medicines. These are the most common medicines used for adult ADHD. They affect certain chemicals in the brain (neurotransmitters). These medicines may be long-acting or short-acting. This will determine how often you need to take the medicine.  A non-stimulant medicine for adult ADHD (atomoxetine). This medicine increases a neurotransmitter called norepinephrine. It may take weeks to months to see effects from this medicine. Psychotherapy and behavioral management are also important for treating ADHD. Psychotherapy is often used along with medicine. Your health care provider may suggest:  Cognitive behavioral therapy (CBT). This type of therapy teaches you to replace negative thoughts and actions with positive thoughts and actions. When used as part of ADHD treatment, this therapy may also include: ? Coping strategies for organization, time management, impulse control, and stress reduction. ? Mindfulness and meditation training.  Behavioral management. This may include strategies for organization and time management. You may work with an ADHD coach who is specially trained to help people with ADHD to manage and organize activities and to function more effectively. Follow these instructions at home: Medicines   Take over-the-counter and prescription medicines only as told by your health care provider.  Talk with your health care provider about the possible side effects of your   medicine to watch for. General instructions   Learn as much as you can about  adult ADHD, and work closely with your health care providers to find the treatments that work best for you.  Do not use drugs or abuse alcohol. Limit alcohol intake to no more than 1 drink a day for nonpregnant women and 2 drinks a day for men. One drink equals 12 oz of beer, 5 oz of wine, or 1 oz of hard liquor.  Follow the same schedule each day. Make sure your schedule includes enough time for you to get plenty of sleep.  Use reminder devices like notes, calendars, and phone apps to stay on-time and organized.  Eat a healthy diet. Do not skip meals.  Exercise regularly. Exercise can help to reduce stress and anxiety.  Keep all follow-up visits as told by your health care provider and therapist. This is important. Where to find more information  A health care provider may be able to recommend resources that are available online or over the phone. You could start with: ? Attention Deficit Disorder Association (ADDA): www.add.org ? National Institute of Mental Health (NIMH): www.nimh.nih.gov Contact a health care provider if:  Your symptoms are changing, getting worse, or not improving.  You have side effects from your medicine, such as: ? Repeated muscle twitches, coughing, or speech outbursts. ? Sleep problems. ? Loss of appetite. ? Depression. ? New or worsening behavior problems. ? Dizziness. ? Unusually fast heartbeat. ? Stomach pains. ? Headaches.  You are struggling with anxiety, depression, or substance abuse. Get help right away if:  You have a severe reaction to a medicine.  You have thoughts of hurting yourself or others. If you ever feel like you may hurt yourself or others, or have thoughts about taking your own life, get help right away. You can go to the nearest emergency department or call:  Your local emergency services (911 in the [place]  A suicide crisis helpline, such as the National Suicide Prevention Lifeline at 1-800-273-8255. This is open 24 hours a  day. Summary  ADHD is a mental health disorder that starts during childhood and often continues into adult years.  The exact cause of ADHD is not known.  There is no cure for ADHD, but treatment with medicine, therapy, or behavioral training can help you manage your condition. This information is not intended to replace advice given to you by your health care provider. Make sure you discuss any questions you have with your health care provider. Document Released: 03/01/2017 Document Revised: 03/01/2017 Document Reviewed: 03/01/2017 Elsevier Interactive Patient Education  2019 [employer]  

## 2018-08-16 NOTE — Progress Notes (Signed)
Patient ID: Jo Jackson Befort, female    DOB: 1970-11-13  Age: 48 y.o. MRN: [id number]    Subjective:  Subjective  HPI Jo Jackson Joswick presents for f/u fibromyalgia , chronic pain , adhd and depression.    Her depression is worsening --- she needs to go to psychiatry but has not had time to make an appointment   Review of Systems  Constitutional: Negative for appetite change, diaphoresis, fatigue and unexpected weight change.  Eyes: Negative for pain, redness and visual disturbance.  Respiratory: Negative for cough, chest tightness, shortness of breath and wheezing.   Cardiovascular: Negative for chest pain, palpitations and leg swelling.  Endocrine: Negative for cold intolerance, heat intolerance, polydipsia, polyphagia and polyuria.  Genitourinary: Negative for difficulty urinating, dysuria and frequency.  Neurological: Negative for dizziness, light-headedness, numbness and headaches.  Psychiatric/Behavioral: Positive for dysphoric mood and sleep disturbance. Negative for self-injury and suicidal ideas. The patient is not nervous/anxious.     History Past Medical History:  Diagnosis Date  . Abnormal Pap smear    Repeats WNL  . ADD (attention deficit disorder)   . Anxiety   . Arthritis    Oseteoarthritis  . Depression   . GERD (gastroesophageal reflux disease)   . Headache(784.0)    Migraines  . Junctional escape rhythm   . Neuromuscular disorder (HCC)    Fibromyalgia  . Pregnancy induced hypertension   . Supraventricular tachycardia (HCC)     She has a past surgical history that includes Appendectomy (1986); Carpal tunnel release (2000); Cervical fusion (08-24-99,04-26-00,05-09-01); Shoulder surgery (left-6-04, right 09-12-04); tongue polyp (1984); Ablation of dysrhythmic focus; and Knee sugery (Left).   Her family history includes Bipolar disorder in her unknown relative; COPD in her mother; Cancer in her paternal aunt; Depression in her unknown relative; Diabetes in her  unknown relative; Heart disease in her mother; Hodgkin's lymphoma in her father; Hypertension in her unknown relative; Thyroid disease in her unknown relative.She reports that she quit smoking about 10 years ago. She has never used smokeless tobacco. She reports current alcohol use. She reports that she does not use drugs.  Current Outpatient Medications on File Prior to Visit  Medication Sig Dispense Refill  . albuterol (PROAIR HFA) 108 (90 Base) MCG/ACT inhaler INHALE 2 PUFFS INTO LUNGS EVERY 6 HOURS AS NEEDED FOR WHEEZING OR SHORTNESS OF BREATH 8.5 Inhaler 2  . cefdinir (OMNICEF) 300 MG capsule Take 1 capsule (300 mg total) by mouth 2 (two) times daily. 20 capsule 0  . Diclofenac Sodium (PENNSAID) 2 % SOLN 4 g qid prn 112 g 5  . doxycycline (VIBRA-TABS) 100 MG tablet Take 1 tablet (100 mg total) by mouth 2 (two) times daily. 20 tablet 0  . Levomilnacipran HCl ER (FETZIMA) 80 MG CP24 1 po qd 90 capsule 3  . Levomilnacipran HCl ER 40 MG CP24 Take 1 capsule by mouth daily. 90 capsule 1  . mometasone (NASONEX) 50 MCG/ACT nasal spray PLACE 2 SPRAYS DAILY INTO THE NOSE. 51 g 3  . norethindrone (MICRONOR,CAMILA,ERRIN) 0.35 MG tablet Take 1 tablet (0.35 mg total) by mouth daily. 3 Package 3  . ondansetron (ZOFRAN-ODT) 8 MG disintegrating tablet TAKE 1 TABLET (8 MG TOTAL) BY MOUTH EVERY 8 (EIGHT) HOURS AS NEEDED FOR NAUSEA OR VOMITING 20 tablet 1  . ondansetron (ZOFRAN-ODT) 8 MG disintegrating tablet TAKE 1 TABLET (8 MG TOTAL) BY MOUTH EVERY 8 (EIGHT) HOURS AS NEEDED FOR NAUSEA OR VOMITING 20 tablet 1  . ondansetron (ZOFRAN-ODT) 8 MG disintegrating tablet TAKE 1 TABLET  BY MOUTH EVERY 8 HOURS AS NEEDED FOR NAUSEA OR VOMITING. 20 tablet 1  . oseltamivir (TAMIFLU) 75 MG capsule Take 1 capsule (75 mg total) by mouth 2 (two) times daily. 10 capsule 0  . pantoprazole (PROTONIX) 40 MG tablet TAKE 1 TABLET BY MOUTH EVERY DAY 90 tablet 1  . predniSONE (DELTASONE) 10 MG tablet TAKE 3 TABLETS PO QD FOR 3 DAYS THEN  TAKE 2 TABLETS PO QD FOR 3 DAYS THEN TAKE 1 TABLET PO QD FOR 3 DAYS THEN TAKE 1/2 TAB PO QD FOR 3 DAYS 20 tablet 0  . SUMAtriptan (IMITREX) 50 MG tablet TAKE 1 TABLET BY MOUTH ONCE, MAY REPEAT IN 2 HOURS IF HEADACHE PERSISTS OR RECURS 10 tablet 2  . tiZANidine (ZANAFLEX) 4 MG tablet Take 1 tablet (4 mg total) by mouth every 6 (six) hours as needed for muscle spasms. 90 tablet 0  . valACYclovir (VALTREX) 1000 MG tablet TAKE 1 TABLET (1,000 MG TOTAL) BY MOUTH 3 (THREE) TIMES DAILY. 30 tablet 5   No current facility-administered medications on file prior to visit.      Objective:  Objective  Physical Exam Vitals signs and nursing note reviewed.  Constitutional:      Appearance: She is well-developed.  HENT:     Head: Normocephalic and atraumatic.  Eyes:     Conjunctiva/sclera: Conjunctivae normal.  Neck:     Musculoskeletal: Normal range of motion and neck supple.     Thyroid: No thyromegaly.     Vascular: No carotid bruit or JVD.  Cardiovascular:     Rate and Rhythm: Normal rate and regular rhythm.     Heart sounds: Normal heart sounds. No murmur.  Pulmonary:     Effort: Pulmonary effort is normal. No respiratory distress.     Breath sounds: Normal breath sounds. No wheezing or rales.  Chest:     Chest wall: No tenderness.  Neurological:     Mental Status: She is alert and oriented to person, place, and time.  Psychiatric:        Attention and Perception: Attention normal.        Mood and Affect: Mood is depressed. Mood is not anxious or elated. Affect is not labile, blunt, flat, angry, tearful or inappropriate.        Speech: Speech normal.        Behavior: Behavior normal.        Thought Content: Thought content normal. Thought content is not paranoid or delusional. Thought content does not include homicidal or suicidal ideation. Thought content does not include homicidal or suicidal plan.        Cognition and Memory: Cognition and memory normal.     Comments: See Phq9    BP  136/86 (BP Location: Right Arm, Cuff Size: Large)   Pulse 86   Temp 98 F (36.7 C) (Oral)   Resp 16   Ht 5\' 7"  (1.702 m)   Wt 293 lb (132.9 kg)   LMP 08/09/2018   SpO2 95%   BMI 45.89 kg/m  Wt Readings from Last 3 Encounters:  08/16/18 293 lb (132.9 kg)  04/15/18 281 lb 6.4 oz (127.6 kg)  03/22/18 279 lb 12.8 oz (126.9 kg)     Lab Results  Component Value Date   WBC 5.2 02/12/2017   HGB 14.0 02/12/2017   HCT 41.1 02/12/2017   PLT 284.0 02/12/2017   GLUCOSE 99 02/12/2017   CHOL 174 02/12/2017   TRIG 58.0 02/12/2017   HDL 72.20 02/12/2017  LDLCALC 91 02/12/2017   ALT 14 02/12/2017   AST 17 02/12/2017   NA 139 02/12/2017   K 4.5 02/12/2017   CL 102 02/12/2017   CREATININE 0.85 02/12/2017   BUN 10 02/12/2017   CO2 32 02/12/2017   TSH 1.98 02/12/2017   HGBA1C 5.4 06/06/2013    US Breast Ltd Uni Left Inc Axilla  Result Date: 04/09/2018 CLINICAL DATA:  Screening recall for a left breast asymmetry. EXAM: DIGITAL DIAGNOSTIC LEFT MAMMOGRAM WITH CAD AND TOMO ULTRASOUND LEFT BREAST COMPARISON:  Previous exam(s). ACR Breast Density Category c: The breast tissue is heterogeneously dense, which may obscure small masses. FINDINGS: Spot compression tomosynthesis images through the area of concern in the superior anterior left breast demonstrates no definite persistent asymmetry. However, due to the density of breast tissue in this region, ultrasound will be performed for further evaluation. Mammographic images were processed with CAD. Ultrasound targeted to the superior left breast at 12 o'clock demonstrates normal fibroglandular tissue. No suspicious masses or areas of shadowing are identified. IMPRESSION: Resolution of the left breast asymmetry consistent with overlapping fibroglandular tissue. RECOMMENDATION: Screening mammogram in one year.(Code:SM-B-01Y) I have discussed the findings and recommendations with the patient. Results were also provided in writing at the conclusion of the  visit. If applicable, a reminder letter will be sent to the patient regarding the next appointment. BI-RADS CATEGORY  1: Negative. Electronically Signed   By: Frederico Hamman M.D.   On: 04/09/2018 09:58   Mm Diag Breast Tomo Uni Left  Result Date: 04/09/2018 CLINICAL DATA:  Screening recall for a left breast asymmetry. EXAM: DIGITAL DIAGNOSTIC LEFT MAMMOGRAM WITH CAD AND TOMO ULTRASOUND LEFT BREAST COMPARISON:  Previous exam(s). ACR Breast Density Category c: The breast tissue is heterogeneously dense, which may obscure small masses. FINDINGS: Spot compression tomosynthesis images through the area of concern in the superior anterior left breast demonstrates no definite persistent asymmetry. However, due to the density of breast tissue in this region, ultrasound will be performed for further evaluation. Mammographic images were processed with CAD. Ultrasound targeted to the superior left breast at 12 o'clock demonstrates normal fibroglandular tissue. No suspicious masses or areas of shadowing are identified. IMPRESSION: Resolution of the left breast asymmetry consistent with overlapping fibroglandular tissue. RECOMMENDATION: Screening mammogram in one year.(Code:SM-B-01Y) I have discussed the findings and recommendations with the patient. Results were also provided in writing at the conclusion of the visit. If applicable, a reminder letter will be sent to the patient regarding the next appointment. BI-RADS CATEGORY  1: Negative. Electronically Signed   By: Frederico Hamman M.D.   On: 04/09/2018 09:58     Assessment & Plan:  Plan  I have discontinued Zsofia Campas's ARIPiprazole. I am also having her start on ARIPiprazole. Additionally, I am having her maintain her SUMAtriptan, Diclofenac Sodium, cefdinir, Levomilnacipran HCl ER, doxycycline, ondansetron, predniSONE, ondansetron, oseltamivir, albuterol, norethindrone, Levomilnacipran HCl ER, pantoprazole, valACYclovir, ondansetron, tiZANidine,  mometasone, traMADol, HYDROcodone-acetaminophen, and amphetamine-dextroamphetamine.  Meds ordered this encounter  Medications  . traMADol (ULTRAM) 50 MG tablet    Sig: Take 1 tablet (50 mg total) by mouth every 8 (eight) hours as needed.    Dispense:  30 tablet    Refill:  0    Not to exceed 5 additional fills before 11/06/2018  . HYDROcodone-acetaminophen (NORCO) 10-325 MG tablet    Sig: Take 1 tablet by mouth every 6 (six) hours as needed.    Dispense:  30 tablet    Refill:  0  . amphetamine-dextroamphetamine (  ADDERALL) 30 MG tablet    Sig: Take 1 tablet by mouth 2 (two) times daily.    Dispense:  180 tablet    Refill:  0  . ARIPiprazole (ABILIFY) 5 MG tablet    Sig: Take 1 tablet (5 mg total) by mouth daily.    Dispense:  30 tablet    Refill:  2    Problem List Items Addressed This Visit      Unprioritized   Attention deficit disorder   Relevant Medications   amphetamine-dextroamphetamine (ADDERALL) 30 MG tablet   Other Relevant Orders   Pain Mgmt, Profile 8 w/Conf, U   Fibromyalgia - Primary   Relevant Medications   HYDROcodone-acetaminophen (NORCO) 10-325 MG tablet   Other Relevant Orders   Pain Mgmt, Profile 8 w/Conf, U   Pain Mgmt, Tramadol w/medMATCH, U    Other Visit Diagnoses    High risk medication use       Relevant Orders   Pain Mgmt, Profile 8 w/Conf, U   Pain Mgmt, Tramadol w/medMATCH, U   Other migraine without status migrainosus, not intractable       Relevant Medications   traMADol (ULTRAM) 50 MG tablet   HYDROcodone-acetaminophen (NORCO) 10-325 MG tablet   Recurrent major depression resistant to treatment (HCC)       Relevant Medications   ARIPiprazole (ABILIFY) 5 MG tablet   Other Relevant Orders   Ambulatory referral to Psychiatry      Follow-up: Return in about 3 months (around 11/15/2018) for annual exam, fasting.  [name], DO

## 2018-08-17 ENCOUNTER — Other Ambulatory Visit: Payer: Self-pay | Admitting: Family Medicine

## 2018-08-17 DIAGNOSIS — M62838 Other muscle spasm: Secondary | ICD-10-CM

## 2018-08-17 DIAGNOSIS — R110 Nausea: Secondary | ICD-10-CM

## 2018-08-19 LAB — PAIN MGMT, PROFILE 8 W/CONF, U
6 Acetylmorphine: NEGATIVE ng/mL (ref <10)
ALCOHOL METABOLITES: NEGATIVE ng/mL (ref <500)
Amphetamine: 3150 ng/mL — ABNORMAL HIGH (ref <250)
Amphetamines: POSITIVE ng/mL — AB (ref <500)
Benzodiazepines: NEGATIVE ng/mL (ref <100)
Buprenorphine, Urine: NEGATIVE ng/mL (ref <5)
Cocaine Metabolite: NEGATIVE ng/mL (ref <150)
Codeine: NEGATIVE ng/mL (ref <50)
Creatinine: 92.5 mg/dL
HYDROCODONE: NEGATIVE ng/mL (ref <50)
Hydromorphone: NEGATIVE ng/mL (ref <50)
MDMA: NEGATIVE ng/mL (ref <500)
Marijuana Metabolite: NEGATIVE ng/mL (ref <20)
Methamphetamine: NEGATIVE ng/mL (ref <250)
Morphine: NEGATIVE ng/mL (ref <50)
Norhydrocodone: NEGATIVE ng/mL (ref <50)
Opiates: NEGATIVE ng/mL (ref <100)
Oxidant: NEGATIVE ug/mL (ref <200)
Oxycodone: NEGATIVE ng/mL (ref <100)
pH: 6.3 (ref 4.5–9.0)

## 2018-08-19 LAB — PAIN MGMT, TRAMADOL W/MEDMATCH, U
Desmethyltramadol: 476 ng/mL — ABNORMAL HIGH (ref <100)
Tramadol: 667 ng/mL — ABNORMAL HIGH (ref <100)

## 2018-10-11 ENCOUNTER — Telehealth: Payer: BLUE CROSS/BLUE SHIELD | Admitting: Family

## 2018-10-11 DIAGNOSIS — J208 Acute bronchitis due to other specified organisms: Secondary | ICD-10-CM

## 2018-10-11 DIAGNOSIS — J111 Influenza due to unidentified influenza virus with other respiratory manifestations: Secondary | ICD-10-CM | POA: Diagnosis not present

## 2018-10-11 MED ORDER — OSELTAMIVIR PHOSPHATE 75 MG PO CAPS: 75.0000 mg | ORAL_CAPSULE | Freq: Two times a day (BID) | ORAL | 0 refills | Status: DC

## 2018-10-11 MED ORDER — ALBUTEROL SULFATE HFA 108 (90 BASE) MCG/ACT IN AERS: INHALATION_SPRAY | RESPIRATORY_TRACT | 2 refills | Status: DC

## 2018-10-11 MED ORDER — BENZONATATE 200 MG PO CAPS: 200.0000 mg | ORAL_CAPSULE | Freq: Three times a day (TID) | ORAL | 0 refills | Status: DC | PRN

## 2018-10-11 NOTE — Progress Notes (Signed)
Greater than 5 minutes, yet less than 10 minutes of time have been spent researching, coordinating, and implementing care for this patient today.  Thank you for the details you included in the comment boxes. Those details are very helpful in determining the best course of treatment for you and help [place] to provide the best care.  E visit for Flu like symptoms   We are sorry that you are not feeling well.  Here is how we plan to help! Based on what you have shared with me it looks like you may have a respiratory virus that may be influenza.  Influenza or "the flu" is   an infection caused by a respiratory virus. The flu virus is highly contagious and persons who did not receive their yearly flu vaccination may "catch" the flu from close contact.  We have anti-viral medications to treat the viruses that cause this infection. They are not a "cure" and only shorten the course of the infection. These prescriptions are most effective when they are given within the first 2 days of "flu" symptoms. Antiviral medication are indicated if you have a high risk of complications from the flu. You should  also consider an antiviral medication if you are in close contact with someone who is at risk. These medications can help patients avoid complications from the flu  but have side effects that you should know. Possible side effects from Tamiflu or oseltamivir include nausea, vomiting, diarrhea, dizziness, headaches, eye redness, sleep problems or other respiratory symptoms. You should not take Tamiflu if you have an allergy to oseltamivir or any to the ingredients in Tamiflu.  Based upon your symptoms and potential risk factors I have prescribed Oseltamivir (Tamiflu).  It has been sent to your designated pharmacy.  You will take one 75 mg capsule orally twice a day for the next 5 days.   I have also sent Tessalon Perles 100mg , take 1-2 every 8 hours as needed for cough.   I have also added an Albuterol inhaler, take  2 puffs every 6 hours as needed for shortness of breath.    ANYONE WHO HAS FLU SYMPTOMS SHOULD: . Stay home. The flu is highly contagious and going out or to work exposes others! . Be sure to drink plenty of fluids. Water is fine as well as fruit juices, sodas and electrolyte beverages. You may want to stay away from caffeine or alcohol. If you are nauseated, try taking small sips of liquids. How do you know if you are getting enough fluid? Your urine should be a pale yellow or almost colorless. . Get rest. . Taking a steamy shower or using a humidifier may help nasal congestion and ease sore throat pain. Using a saline nasal spray works much the same way. . Cough drops, hard candies and sore throat lozenges may ease your cough. . Line up a caregiver. Have someone check on you regularly.   GET HELP RIGHT AWAY IF: . You cannot keep down liquids or your medications. . You become short of breath . Your fell like you are going to pass out or loose consciousness. . Your symptoms persist after you have completed your treatment plan MAKE SURE YOU   Understand these instructions.  Will watch your condition.  Will get help right away if you are not doing well or get worse.  Your e-visit answers were reviewed by a board certified advanced clinical practitioner to complete your personal care plan.  Depending on the condition, your plan could  have included both over the counter or prescription medications.  If there is a problem please reply  once you have received a response from your provider.  Your safety is important to [place].  If you have drug allergies check your prescription carefully.    You can use MyChart to ask questions about today's visit, request a non-urgent call back, or ask for a work or school excuse for 24 hours related to this e-Visit. If it has been greater than 24 hours you will need to follow up with your provider, or enter a new e-Visit to address those concerns.  You will  get an e-mail in the next two days asking about your experience.  I hope that your e-visit has been valuable and will speed your recovery. Thank you for using e-visits.

## 2018-10-14 ENCOUNTER — Other Ambulatory Visit: Payer: Self-pay | Admitting: Physician Assistant

## 2018-10-14 ENCOUNTER — Encounter: Payer: Self-pay | Admitting: Family

## 2018-10-14 MED ORDER — BENZONATATE 100 MG PO CAPS: 100.0000 mg | ORAL_CAPSULE | Freq: Two times a day (BID) | ORAL | 0 refills | Status: DC | PRN

## 2018-10-14 NOTE — Addendum Note (Signed)
Addended by: Demetrio Lapping on: 10/14/2018 08:10 AM   Modules accepted: Orders

## 2018-10-21 ENCOUNTER — Other Ambulatory Visit: Payer: Self-pay | Admitting: *Deleted

## 2018-10-21 DIAGNOSIS — F339 Major depressive disorder, recurrent, unspecified: Secondary | ICD-10-CM

## 2018-10-21 MED ORDER — ARIPIPRAZOLE 5 MG PO TABS: 5.0000 mg | ORAL_TABLET | Freq: Every day | ORAL | 1 refills | Status: DC

## 2018-11-05 ENCOUNTER — Other Ambulatory Visit: Payer: Self-pay | Admitting: Family Medicine

## 2018-11-05 DIAGNOSIS — M797 Fibromyalgia: Secondary | ICD-10-CM

## 2018-11-05 DIAGNOSIS — M62838 Other muscle spasm: Secondary | ICD-10-CM

## 2018-11-05 DIAGNOSIS — F988 Other specified behavioral and emotional disorders with onset usually occurring in childhood and adolescence: Secondary | ICD-10-CM

## 2018-11-05 DIAGNOSIS — G43809 Other migraine, not intractable, without status migrainosus: Secondary | ICD-10-CM

## 2018-11-05 DIAGNOSIS — R110 Nausea: Secondary | ICD-10-CM

## 2018-11-06 ENCOUNTER — Other Ambulatory Visit: Payer: Self-pay | Admitting: Family Medicine

## 2018-11-06 DIAGNOSIS — G43809 Other migraine, not intractable, without status migrainosus: Secondary | ICD-10-CM

## 2018-11-06 DIAGNOSIS — M62838 Other muscle spasm: Secondary | ICD-10-CM

## 2018-11-06 DIAGNOSIS — F988 Other specified behavioral and emotional disorders with onset usually occurring in childhood and adolescence: Secondary | ICD-10-CM

## 2018-11-06 DIAGNOSIS — M797 Fibromyalgia: Secondary | ICD-10-CM

## 2018-11-06 DIAGNOSIS — R110 Nausea: Secondary | ICD-10-CM

## 2018-11-06 MED ORDER — HYDROCODONE-ACETAMINOPHEN 10-325 MG PO TABS: 1.0000 | ORAL_TABLET | Freq: Four times a day (QID) | ORAL | 0 refills | Status: DC | PRN

## 2018-11-06 MED ORDER — TIZANIDINE HCL 4 MG PO TABS: 4.0000 mg | ORAL_TABLET | Freq: Four times a day (QID) | ORAL | 0 refills | Status: DC | PRN

## 2018-11-06 MED ORDER — TRAMADOL HCL 50 MG PO TABS: 50.0000 mg | ORAL_TABLET | Freq: Three times a day (TID) | ORAL | 0 refills | Status: DC | PRN

## 2018-11-06 MED ORDER — AMPHETAMINE-DEXTROAMPHETAMINE 30 MG PO TABS: 30.0000 mg | ORAL_TABLET | Freq: Two times a day (BID) | ORAL | 0 refills | Status: DC

## 2018-11-06 NOTE — Telephone Encounter (Signed)
I accidentally printed them. If you approved please resend.

## 2018-11-06 NOTE — Telephone Encounter (Signed)
Last written: all written on 08/16/18 Last ov: 08/16/18 Next ov: 12/12/18 Contract: 08/16/18 UDS: 02/14/19

## 2018-11-19 IMAGING — US ULTRASOUND LEFT BREAST LIMITED
1 series · 3 of 3 positions shown · non-contrast
Comparison: Previous exam(s).

CLINICAL DATA: Screening recall for a left breast asymmetry.

EXAM:
DIGITAL DIAGNOSTIC LEFT MAMMOGRAM WITH CAD AND TOMO
ULTRASOUND LEFT BREAST

[Series 1: ultrasound left breast limited · 0.07mm/px · 3 of 3 slices shown]
[im 1/3]
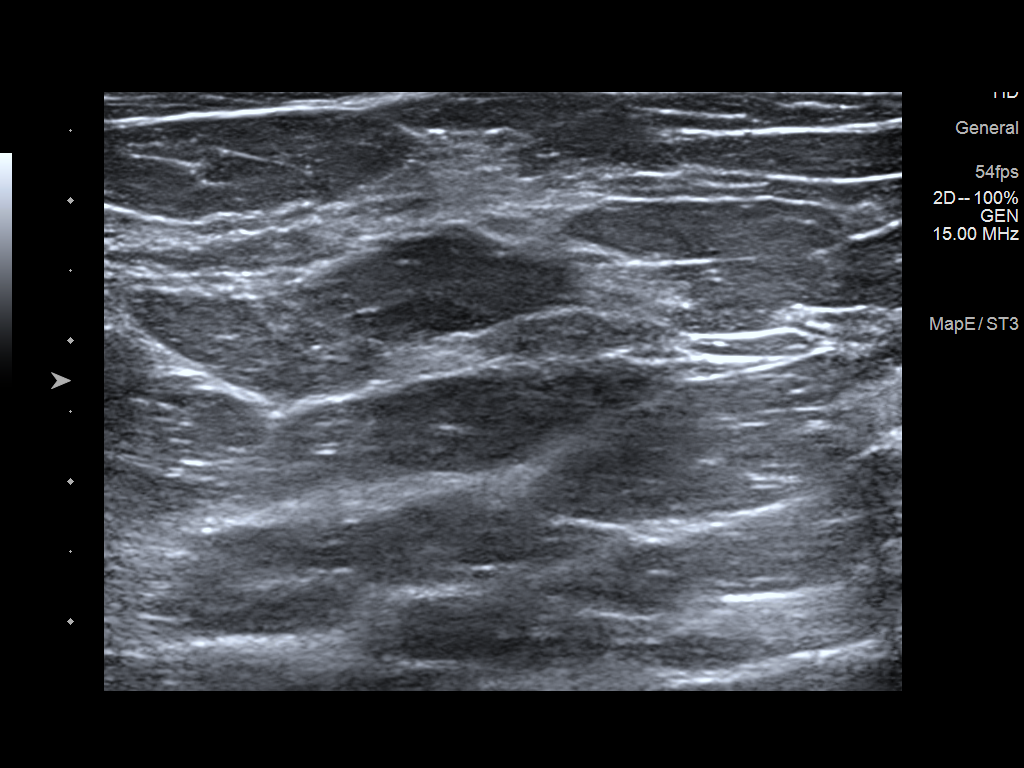
[im 2/3]
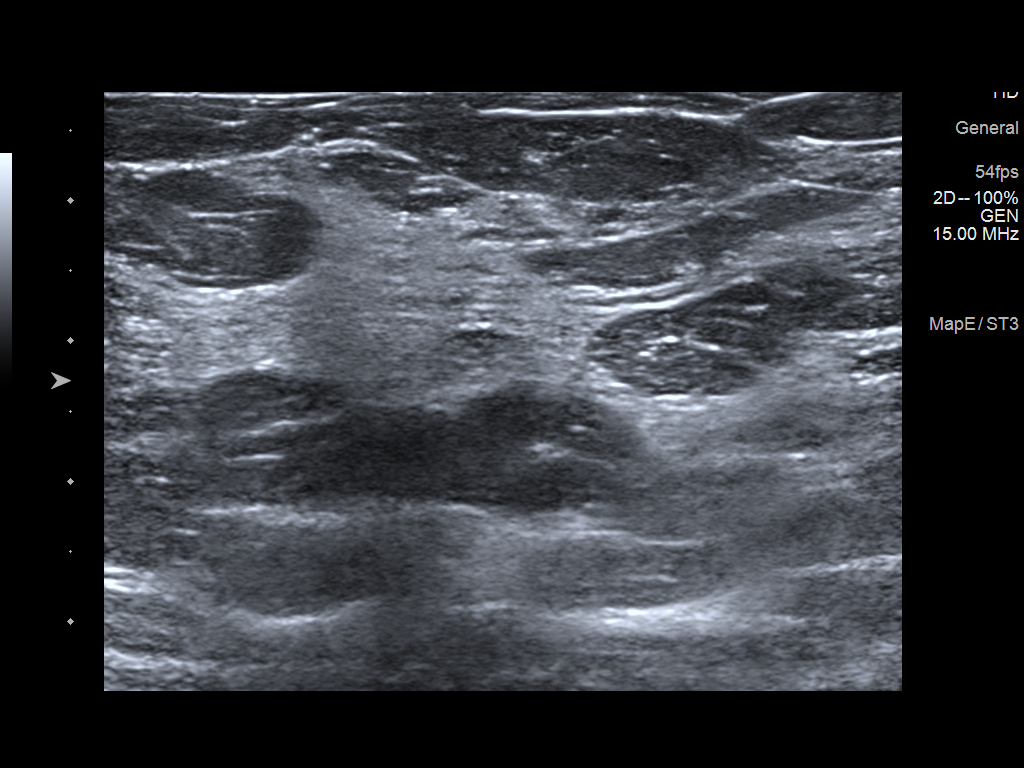
[im 3/3]
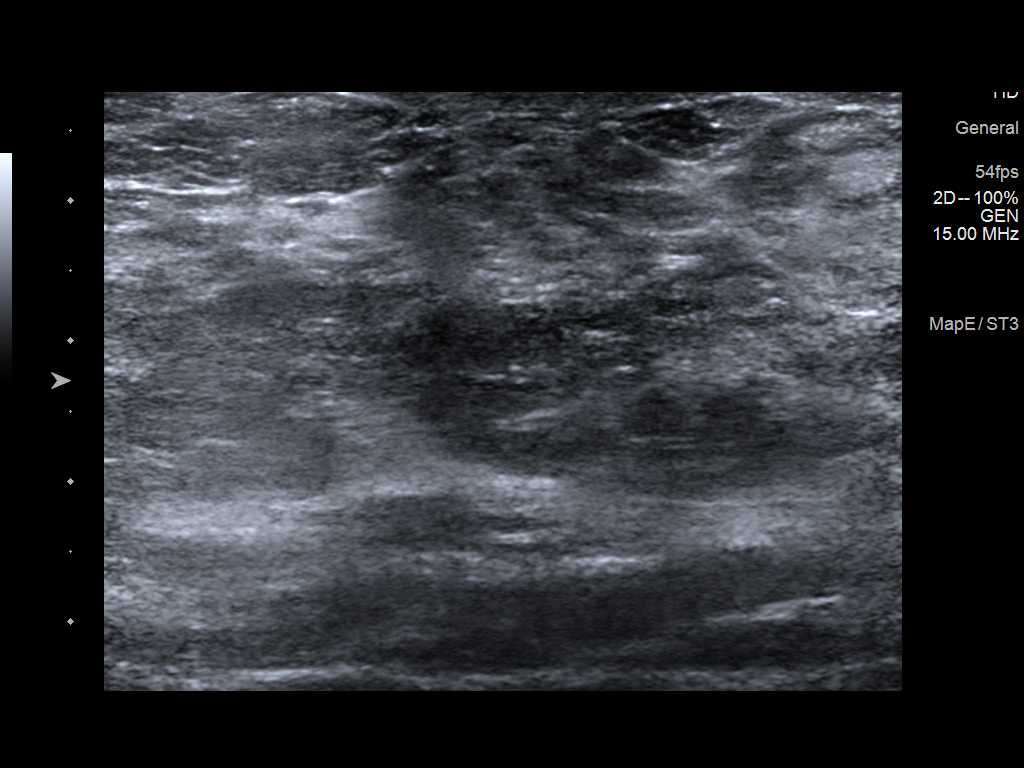

[3 of 3 positions shown; findings below may reference images not displayed]

ACR Breast Density Category c: The breast tissue is heterogeneously
dense, which may obscure small masses.
FINDINGS: Spot compression tomosynthesis images through the area of concern in
the superior anterior left breast demonstrates no definite
persistent asymmetry. However, due to the density of breast tissue
in this region, ultrasound will be performed for further evaluation.

Mammographic images were processed with CAD.

Ultrasound targeted to the superior left breast at 12 o'clock
demonstrates normal fibroglandular tissue. No suspicious masses or
areas of shadowing are identified.
IMPRESSION: Resolution of the left breast asymmetry consistent with overlapping
fibroglandular tissue.

RECOMMENDATION:
Screening mammogram in one year.(Code:[id number])

I have discussed the findings and recommendations with the patient.
Results were also provided in writing at the conclusion of the
visit. If applicable, a reminder letter will be sent to the patient
regarding the next appointment.

BI-RADS CATEGORY  1: Negative.

## 2018-11-25 ENCOUNTER — Other Ambulatory Visit: Payer: Self-pay | Admitting: Family Medicine

## 2018-11-25 DIAGNOSIS — M797 Fibromyalgia: Secondary | ICD-10-CM

## 2018-11-25 DIAGNOSIS — F988 Other specified behavioral and emotional disorders with onset usually occurring in childhood and adolescence: Secondary | ICD-10-CM

## 2018-11-25 DIAGNOSIS — G43809 Other migraine, not intractable, without status migrainosus: Secondary | ICD-10-CM

## 2018-11-26 ENCOUNTER — Encounter: Payer: Self-pay | Admitting: *Deleted

## 2018-11-26 MED ORDER — HYDROCODONE-ACETAMINOPHEN 10-325 MG PO TABS: 1.0000 | ORAL_TABLET | Freq: Four times a day (QID) | ORAL | 0 refills | Status: DC | PRN

## 2018-11-26 MED ORDER — AMPHETAMINE-DEXTROAMPHETAMINE 30 MG PO TABS: 30.0000 mg | ORAL_TABLET | Freq: Two times a day (BID) | ORAL | 0 refills | Status: DC

## 2018-11-26 MED ORDER — TRAMADOL HCL 50 MG PO TABS: 50.0000 mg | ORAL_TABLET | Freq: Three times a day (TID) | ORAL | 0 refills | Status: DC | PRN

## 2018-11-26 NOTE — Telephone Encounter (Signed)
Last written: all last filled on 11/06/18 Last ov: 08/16/18 Next ov: message sent to patient to schedule Contract: 08/17/19 UDS: 02/14/19

## 2018-12-08 ENCOUNTER — Other Ambulatory Visit: Payer: Self-pay | Admitting: Family Medicine

## 2018-12-08 DIAGNOSIS — M62838 Other muscle spasm: Secondary | ICD-10-CM

## 2018-12-08 DIAGNOSIS — R110 Nausea: Secondary | ICD-10-CM

## 2018-12-09 ENCOUNTER — Other Ambulatory Visit: Payer: Self-pay | Admitting: Family Medicine

## 2018-12-09 DIAGNOSIS — F320 Major depressive disorder, single episode, mild: Secondary | ICD-10-CM

## 2018-12-12 ENCOUNTER — Encounter: Payer: Self-pay | Admitting: Family Medicine

## 2019-02-16 ENCOUNTER — Other Ambulatory Visit: Payer: Self-pay | Admitting: Family Medicine

## 2019-02-16 DIAGNOSIS — K219 Gastro-esophageal reflux disease without esophagitis: Secondary | ICD-10-CM

## 2019-02-25 ENCOUNTER — Other Ambulatory Visit: Payer: Self-pay | Admitting: Family Medicine

## 2019-02-25 ENCOUNTER — Ambulatory Visit (INDEPENDENT_AMBULATORY_CARE_PROVIDER_SITE_OTHER): Payer: BLUE CROSS/BLUE SHIELD | Admitting: Family Medicine

## 2019-02-25 ENCOUNTER — Encounter: Payer: Self-pay | Admitting: Family Medicine

## 2019-02-25 ENCOUNTER — Other Ambulatory Visit: Payer: Self-pay

## 2019-02-25 VITALS — BP 148/84 | HR 76 | Temp 98.3°F | Resp 18 | Ht 67.0 in | Wt 287.2 lb

## 2019-02-25 DIAGNOSIS — K219 Gastro-esophageal reflux disease without esophagitis: Secondary | ICD-10-CM

## 2019-02-25 DIAGNOSIS — G43809 Other migraine, not intractable, without status migrainosus: Secondary | ICD-10-CM | POA: Diagnosis not present

## 2019-02-25 DIAGNOSIS — Z79899 Other long term (current) drug therapy: Secondary | ICD-10-CM

## 2019-02-25 DIAGNOSIS — F320 Major depressive disorder, single episode, mild: Secondary | ICD-10-CM | POA: Diagnosis not present

## 2019-02-25 DIAGNOSIS — R110 Nausea: Secondary | ICD-10-CM

## 2019-02-25 DIAGNOSIS — Z0000 Encounter for general adult medical examination without abnormal findings: Secondary | ICD-10-CM

## 2019-02-25 DIAGNOSIS — M62838 Other muscle spasm: Secondary | ICD-10-CM | POA: Diagnosis not present

## 2019-02-25 DIAGNOSIS — M797 Fibromyalgia: Secondary | ICD-10-CM

## 2019-02-25 DIAGNOSIS — F988 Other specified behavioral and emotional disorders with onset usually occurring in childhood and adolescence: Secondary | ICD-10-CM

## 2019-02-25 MED ORDER — PANTOPRAZOLE SODIUM 40 MG PO TBEC: 40.0000 mg | DELAYED_RELEASE_TABLET | Freq: Every day | ORAL | 1 refills | Status: DC

## 2019-02-25 MED ORDER — VORTIOXETINE HBR 20 MG PO TABS: 20.0000 mg | ORAL_TABLET | Freq: Every day | ORAL | 1 refills | Status: DC

## 2019-02-25 MED ORDER — TIZANIDINE HCL 4 MG PO TABS: 4.0000 mg | ORAL_TABLET | Freq: Four times a day (QID) | ORAL | 0 refills | Status: DC | PRN

## 2019-02-25 MED ORDER — ONDANSETRON 8 MG PO TBDP: 8.0000 mg | ORAL_TABLET | Freq: Three times a day (TID) | ORAL | 1 refills | Status: DC | PRN

## 2019-02-25 MED ORDER — TRAMADOL HCL 50 MG PO TABS: 50.0000 mg | ORAL_TABLET | Freq: Three times a day (TID) | ORAL | 0 refills | Status: DC | PRN

## 2019-02-25 MED ORDER — SUMATRIPTAN SUCCINATE 50 MG PO TABS: ORAL_TABLET | ORAL | 2 refills | Status: DC

## 2019-02-25 MED ORDER — HYDROCODONE-ACETAMINOPHEN 10-325 MG PO TABS: 1.0000 | ORAL_TABLET | Freq: Four times a day (QID) | ORAL | 0 refills | Status: DC | PRN

## 2019-02-25 MED ORDER — AMPHETAMINE-DEXTROAMPHETAMINE 30 MG PO TABS: 30.0000 mg | ORAL_TABLET | Freq: Two times a day (BID) | ORAL | 0 refills | Status: DC

## 2019-02-25 NOTE — Progress Notes (Signed)
Subjective:     Jo Jackson is a 48 y.o. female and is here for a comprehensive physical exam. The patient reports no problems.  Social History   Socioeconomic History  . Marital status: Married    Spouse name: Not on file  . Number of children: 2  . Years of education: Not on file  . Highest education level: Not on file  Occupational History    Employer: [employer]    Comment: P/T  . Occupation: SMALL SORT ArchitectORTER    Employer: [employer]  Social Needs  . Financial resource strain: Not on file  . Food insecurity    Worry: Not on file    Inability: Not on file  . Transportation needs    Medical: Not on file    Non-medical: Not on file  Tobacco Use  . Smoking status: Former Smoker    Quit date: 10/23/2007    Years since quitting: 11.3  . Smokeless tobacco: Never Used  Substance and Sexual Activity  . Alcohol use: Yes    Alcohol/week: 0.0 standard drinks    Comment: Occ  . Drug use: No  . Sexual activity: Yes  Lifestyle  . Physical activity    Days per week: Not on file    Minutes per session: Not on file  . Stress: Not on file  Relationships  . Social Musicianconnections    Talks on phone: Not on file    Gets together: Not on file    Attends religious service: Not on file    Active member of club or organization: Not on file    Attends meetings of clubs or organizations: Not on file    Relationship status: Not on file  . Intimate partner violence    Fear of current or ex partner: Not on file    Emotionally abused: Not on file    Physically abused: Not on file    Forced sexual activity: Not on file  Other Topics Concern  . Not on file  Social History Narrative   G1p1      Exercise- no   Health Maintenance  Topic Date Due  . INFLUENZA VACCINE  02/22/2019  . PAP SMEAR-Modifier  02/13/2020  . TETANUS/TDAP  07/10/2022  . HIV Screening  Completed    The following portions of the patient's history were reviewed and updated as appropriate: She  has a past  medical history of Abnormal Pap smear, ADD (attention deficit disorder), Anxiety, Arthritis, Depression, GERD (gastroesophageal reflux disease), Headache(784.0), Junctional escape rhythm, Neuromuscular disorder (HCC), Pregnancy induced hypertension, and Supraventricular tachycardia (HCC). She does not have any pertinent problems on file. She  has a past surgical history that includes Appendectomy (1986); Carpal tunnel release (2000); Cervical fusion (08-24-99,04-26-00,05-09-01); Shoulder surgery (left-6-04, right 09-12-04); tongue polyp (1984); Ablation of dysrhythmic focus; and Knee sugery (Left). Her family history includes Bipolar disorder in an other family member; COPD in her mother; Cancer in her paternal aunt; Depression in an other family member; Diabetes in an other family member; Heart disease in her mother; Hodgkin's lymphoma in her father; Hypertension in an other family member; Thyroid disease in an other family member. She  reports that she quit smoking about 11 years ago. She has never used smokeless tobacco. She reports current alcohol use. She reports that she does not use drugs. She has a current medication list which includes the following prescription(s): albuterol, amphetamine-dextroamphetamine, aripiprazole, diclofenac sodium, hydrocodone-acetaminophen, levomilnacipran hcl er, mometasone, norethindrone, ondansetron, pantoprazole, sumatriptan, tizanidine, tramadol, valacyclovir, vortioxetine hbr,  and levomilnacipran hcl er. Current Outpatient Medications on File Prior to Visit  Medication Sig Dispense Refill  . albuterol (PROAIR HFA) 108 (90 Base) MCG/ACT inhaler INHALE 2 PUFFS INTO LUNGS EVERY 6 HOURS AS NEEDED FOR WHEEZING OR SHORTNESS OF BREATH 8.5 Inhaler 2  . ARIPiprazole (ABILIFY) 5 MG tablet Take 1 tablet (5 mg total) by mouth daily. 90 tablet 1  . Diclofenac Sodium (PENNSAID) 2 % SOLN 4 g qid prn 112 g 5  . Levomilnacipran HCl ER 40 MG CP24 Take 1 capsule by mouth daily. 90  capsule 1  . mometasone (NASONEX) 50 MCG/ACT nasal spray PLACE 2 SPRAYS DAILY INTO THE NOSE. 51 g 3  . norethindrone (MICRONOR,CAMILA,ERRIN) 0.35 MG tablet Take 1 tablet (0.35 mg total) by mouth daily. 3 Package 3  . valACYclovir (VALTREX) 1000 MG tablet TAKE 1 TABLET (1,000 MG TOTAL) BY MOUTH 3 (THREE) TIMES DAILY. 30 tablet 5  . Levomilnacipran HCl ER (FETZIMA) 80 MG CP24 1 po qd (Patient not taking: Reported on 02/25/2019) 90 capsule 3   No current facility-administered medications on file prior to visit.    She is allergic to ketorolac tromethamine; morphine; and latex..  Review of Systems Review of Systems  Constitutional: Negative for activity change, appetite change and fatigue.  HENT: Negative for hearing loss, congestion, tinnitus and ear discharge.  dentist q80m Eyes: Negative for visual disturbance (see optho q1y -- vision corrected to 20/20 with glasses).  Respiratory: Negative for cough, chest tightness and shortness of breath.   Cardiovascular: Negative for chest pain, palpitations and leg swelling.  Gastrointestinal: Negative for abdominal pain, diarrhea, constipation and abdominal distention.  Genitourinary: Negative for urgency, frequency, decreased urine volume and difficulty urinating.  Musculoskeletal: Negative for back pain, arthralgias and gait problem.  Skin: Negative for color change, pallor and rash.  Neurological: Negative for dizziness, light-headedness, numbness and headaches.  Hematological: Negative for adenopathy. Does not bruise/bleed easily.  Psychiatric/Behavioral: Negative for suicidal ideas, confusion, sleep disturbance, self-injury, dysphoric mood, decreased concentration and agitation.       Objective:    BP (!) 148/84 (BP Location: Left Arm, Patient Position: Sitting, Cuff Size: Normal)   Pulse 76   Temp 98.3 F (36.8 C) (Oral)   Resp 18   Ht 5\' 7"  (1.702 m)   Wt 287 lb 3.2 oz (130.3 kg)   LMP 02/11/2019   SpO2 98%   BMI 44.98 kg/m   General appearance: alert, cooperative, appears stated age and no distress Head: Normocephalic, without obvious abnormality, atraumatic Eyes: conjunctivae/corneas clear. PERRL, EOM's intact. Fundi benign. Ears: normal TM's and external ear canals both ears Nose: Nares normal. Septum midline. Mucosa normal. No drainage or sinus tenderness. Throat: lips, mucosa, and tongue normal; teeth and gums normal Neck: no adenopathy, no carotid bruit, no JVD, supple, symmetrical, trachea midline and thyroid not enlarged, symmetric, no tenderness/mass/nodules Back: symmetric, no curvature. ROM normal. No CVA tenderness. Lungs: clear to auscultation bilaterally Breasts: gyn Heart: regular rate and rhythm, S1, S2 normal, no murmur, click, rub or gallop Abdomen: soft, non-tender; bowel sounds normal; no masses,  no organomegaly Pelvic: deferred Extremities: extremities normal, atraumatic, no cyanosis or edema Pulses: 2+ and symmetric Skin: Skin color, texture, turgor normal. No rashes or lesions Lymph nodes: Cervical, supraclavicular, and axillary nodes normal. Neurologic: Alert and oriented X 3, normal strength and tone. Normal symmetric reflexes. Normal coordination and gait    Assessment:    Healthy female exam.      Plan:    ghm utd  Check labs  See After Visit Summary for Counseling Recommendations    1. Depression, major, single episode, mild (HCC) Stable con't meds  - vortioxetine HBr (TRINTELLIX) 20 MG TABS tablet; Take 1 tablet (20 mg total) by mouth daily.  Dispense: 90 tablet; Refill: 1  2. Other migraine without status migrainosus, not intractable stable - traMADol (ULTRAM) 50 MG tablet; Take 1 tablet (50 mg total) by mouth every 8 (eight) hours as needed.  Dispense: 30 tablet; Refill: 0 - SUMAtriptan (IMITREX) 50 MG tablet; TAKE 1 TABLET BY MOUTH ONCE, MAY REPEAT IN 2 HOURS IF HEADACHE PERSISTS OR RECURS  Dispense: 10 tablet; Refill: 2  3. Nausea controlled - tiZANidine  (ZANAFLEX) 4 MG tablet; Take 1 tablet (4 mg total) by mouth every 6 (six) hours as needed for muscle spasms.  Dispense: 90 tablet; Refill: 0 - ondansetron (ZOFRAN-ODT) 8 MG disintegrating tablet; Take 1 tablet (8 mg total) by mouth every 8 (eight) hours as needed for nausea or vomiting.  Dispense: 20 tablet; Refill: 1  4. Muscle spasm stable - tiZANidine (ZANAFLEX) 4 MG tablet; Take 1 tablet (4 mg total) by mouth every 6 (six) hours as needed for muscle spasms.  Dispense: 90 tablet; Refill: 0 - ondansetron (ZOFRAN-ODT) 8 MG disintegrating tablet; Take 1 tablet (8 mg total) by mouth every 8 (eight) hours as needed for nausea or vomiting.  Dispense: 20 tablet; Refill: 1  5. Gastroesophageal reflux disease, esophagitis presence not specified Controlled  - pantoprazole (PROTONIX) 40 MG tablet; Take 1 tablet (40 mg total) by mouth daily.  Dispense: 90 tablet; Refill: 1  6. Attention deficit disorder, unspecified hyperactivity presence stable - amphetamine-dextroamphetamine (ADDERALL) 30 MG tablet; Take 1 tablet by mouth 2 (two) times daily.  Dispense: 180 tablet; Refill: 0  7. Fibromyalgia Stable uds and contract done Database reviewed  - HYDROcodone-acetaminophen (NORCO) 10-325 MG tablet; Take 1 tablet by mouth every 6 (six) hours as needed.  Dispense: 30 tablet; Refill: 0  8. Preventative health care See above  - Lipid panel - CBC with Differential/Platelet - TSH - Comprehensive metabolic panel

## 2019-02-26 LAB — CBC WITH DIFFERENTIAL/PLATELET
Basophils Absolute: 0 10*3/uL (ref 0.0–0.1)
Basophils Relative: 0.6 % (ref 0.0–3.0)
Eosinophils Absolute: 0.1 10*3/uL (ref 0.0–0.7)
Eosinophils Relative: 2.1 % (ref 0.0–5.0)
HCT: 39.9 % (ref 36.0–46.0)
Hemoglobin: 13.7 g/dL (ref 12.0–15.0)
Lymphocytes Relative: 33.8 % (ref 12.0–46.0)
Lymphs Abs: 1.9 10*3/uL (ref 0.7–4.0)
MCHC: 34.4 g/dL (ref 30.0–36.0)
MCV: 84.2 fl (ref 78.0–100.0)
Monocytes Absolute: 0.6 10*3/uL (ref 0.1–1.0)
Monocytes Relative: 10.2 % (ref 3.0–12.0)
Neutro Abs: 3 10*3/uL (ref 1.4–7.7)
Neutrophils Relative %: 53.3 % (ref 43.0–77.0)
Platelets: 231 10*3/uL (ref 150.0–400.0)
RBC: 4.74 Mil/uL (ref 3.87–5.11)
RDW: 12.8 % (ref 11.5–15.5)
WBC: 5.6 10*3/uL (ref 4.0–10.5)

## 2019-02-26 LAB — COMPREHENSIVE METABOLIC PANEL
ALT: 16 U/L (ref 0–35)
AST: 15 U/L (ref 0–37)
Albumin: 4 g/dL (ref 3.5–5.2)
Alkaline Phosphatase: 59 U/L (ref 39–117)
BUN: 14 mg/dL (ref 6–23)
CO2: 28 mEq/L (ref 19–32)
Calcium: 9.4 mg/dL (ref 8.4–10.5)
Chloride: 102 mEq/L (ref 96–112)
Creatinine, Ser: 0.75 mg/dL (ref 0.40–1.20)
GFR: 82.79 mL/min (ref >60.00)
Glucose, Bld: 89 mg/dL (ref 70–99)
Potassium: 3.7 mEq/L (ref 3.5–5.1)
Sodium: 138 mEq/L (ref 135–145)
Total Bilirubin: 0.7 mg/dL (ref 0.2–1.2)
Total Protein: 6.5 g/dL (ref 6.0–8.3)

## 2019-02-26 LAB — LIPID PANEL
Cholesterol: 154 mg/dL (ref 0–200)
HDL: 63.2 mg/dL (ref >39.00)
LDL Cholesterol: 81 mg/dL (ref 0–99)
NonHDL: 90.57
Total CHOL/HDL Ratio: 2
Triglycerides: 50 mg/dL (ref 0.0–149.0)
VLDL: 10 mg/dL (ref 0.0–40.0)

## 2019-02-26 LAB — TSH: TSH: 2.91 u[IU]/mL (ref 0.35–4.50)

## 2019-02-27 LAB — PAIN MGMT, PROFILE 8 W/CONF, U
6 Acetylmorphine: NEGATIVE ng/mL
Alcohol Metabolites: NEGATIVE ng/mL (ref <500)
Amphetamine: 2631 ng/mL
Amphetamines: POSITIVE ng/mL
Benzodiazepines: NEGATIVE ng/mL
Buprenorphine, Urine: NEGATIVE ng/mL
Cocaine Metabolite: NEGATIVE ng/mL
Creatinine: 98.2 mg/dL
MDMA: NEGATIVE ng/mL
Marijuana Metabolite: NEGATIVE ng/mL
Methamphetamine: NEGATIVE ng/mL
Opiates: NEGATIVE ng/mL
Oxidant: NEGATIVE ug/mL
Oxycodone: NEGATIVE ng/mL
pH: 5.8 (ref 4.5–9.0)

## 2019-02-27 LAB — PAIN MGMT, TRAMADOL W/MEDMATCH, U
Desmethyltramadol: 4349 ng/mL
Tramadol: 7878 ng/mL

## 2019-03-11 ENCOUNTER — Other Ambulatory Visit: Payer: Self-pay | Admitting: Family Medicine

## 2019-03-11 DIAGNOSIS — M62838 Other muscle spasm: Secondary | ICD-10-CM

## 2019-03-11 DIAGNOSIS — R110 Nausea: Secondary | ICD-10-CM

## 2019-04-01 ENCOUNTER — Other Ambulatory Visit: Payer: Self-pay | Admitting: Family Medicine

## 2019-04-01 DIAGNOSIS — R110 Nausea: Secondary | ICD-10-CM

## 2019-04-01 DIAGNOSIS — M797 Fibromyalgia: Secondary | ICD-10-CM

## 2019-04-01 DIAGNOSIS — M62838 Other muscle spasm: Secondary | ICD-10-CM

## 2019-04-01 DIAGNOSIS — G43809 Other migraine, not intractable, without status migrainosus: Secondary | ICD-10-CM

## 2019-04-02 MED ORDER — TIZANIDINE HCL 4 MG PO TABS: 4.0000 mg | ORAL_TABLET | Freq: Four times a day (QID) | ORAL | 0 refills | Status: DC | PRN

## 2019-04-02 NOTE — Telephone Encounter (Signed)
Requesting: Tramadol Contract: 08/16/2018 UDS: 02/25/19, low risk Last OV: 02/25/2019 Next OV: 08/25/19 Last Refill: 02/25/2019, #30--0 RF Database:   Please advise

## 2019-04-02 NOTE — Telephone Encounter (Signed)
Requesting: NORCO Contract: 08/16/2018 UDS: 02/25/2019, low risk Last OV: 02/25/2019 Next OV: 08/25/19 Last Refill: 02/25/2019, #30--0 RF Database:   Requesting: Tramadol Contract: UDS: Last OV: Next OV: Last Refill: 02/25/2019, #30--0 RF Database:   Please advise

## 2019-04-03 ENCOUNTER — Other Ambulatory Visit: Payer: Self-pay | Admitting: Family Medicine

## 2019-04-03 ENCOUNTER — Encounter: Payer: Self-pay | Admitting: Family Medicine

## 2019-04-03 DIAGNOSIS — M797 Fibromyalgia: Secondary | ICD-10-CM

## 2019-04-03 MED ORDER — HYDROCODONE-ACETAMINOPHEN 10-325 MG PO TABS: 1.0000 | ORAL_TABLET | Freq: Four times a day (QID) | ORAL | 0 refills | Status: DC | PRN

## 2019-04-17 ENCOUNTER — Ambulatory Visit: Payer: Self-pay

## 2019-04-21 ENCOUNTER — Other Ambulatory Visit: Payer: Self-pay | Admitting: Family Medicine

## 2019-04-27 ENCOUNTER — Other Ambulatory Visit: Payer: Self-pay | Admitting: Family Medicine

## 2019-04-27 DIAGNOSIS — F339 Major depressive disorder, recurrent, unspecified: Secondary | ICD-10-CM

## 2019-05-10 ENCOUNTER — Encounter: Payer: Self-pay | Admitting: Family Medicine

## 2019-05-10 ENCOUNTER — Other Ambulatory Visit: Payer: Self-pay | Admitting: Family Medicine

## 2019-05-10 DIAGNOSIS — M797 Fibromyalgia: Secondary | ICD-10-CM

## 2019-05-10 DIAGNOSIS — G43809 Other migraine, not intractable, without status migrainosus: Secondary | ICD-10-CM

## 2019-05-12 ENCOUNTER — Other Ambulatory Visit: Payer: Self-pay

## 2019-05-12 MED ORDER — TRAMADOL HCL 50 MG PO TABS: 50.0000 mg | ORAL_TABLET | Freq: Three times a day (TID) | ORAL | 0 refills | Status: DC | PRN

## 2019-05-12 MED ORDER — HYDROCODONE-ACETAMINOPHEN 10-325 MG PO TABS: 1.0000 | ORAL_TABLET | Freq: Four times a day (QID) | ORAL | 0 refills | Status: DC | PRN

## 2019-05-12 NOTE — Telephone Encounter (Signed)
Her tsh was normal

## 2019-05-12 NOTE — Telephone Encounter (Signed)
Requesting: NORCO Contract: 08/16/2018 UDS: 02/25/2019, low risk Last OV: 02/25/2019 Next OV: 08/25/2019 Last Refill: 04/03/2019, #30--0 RF Database:   Requesting: Tramadol Contract: UDS: Last OV: Next OV: Last Refill: 04/03/2019, #30--0 RF Database:   Please advise

## 2019-05-20 ENCOUNTER — Other Ambulatory Visit: Payer: Self-pay | Admitting: Family Medicine

## 2019-05-20 DIAGNOSIS — M62838 Other muscle spasm: Secondary | ICD-10-CM

## 2019-05-20 DIAGNOSIS — R110 Nausea: Secondary | ICD-10-CM

## 2019-05-23 ENCOUNTER — Other Ambulatory Visit: Payer: Self-pay

## 2019-05-23 ENCOUNTER — Ambulatory Visit (INDEPENDENT_AMBULATORY_CARE_PROVIDER_SITE_OTHER): Payer: BC Managed Care – PPO

## 2019-05-23 DIAGNOSIS — Z23 Encounter for immunization: Secondary | ICD-10-CM | POA: Diagnosis not present

## 2019-05-31 ENCOUNTER — Other Ambulatory Visit: Payer: Self-pay

## 2019-05-31 ENCOUNTER — Ambulatory Visit (HOSPITAL_COMMUNITY): Payer: BC Managed Care – PPO | Admitting: Psychiatry

## 2019-06-02 ENCOUNTER — Other Ambulatory Visit: Payer: Self-pay | Admitting: Family Medicine

## 2019-06-02 DIAGNOSIS — F988 Other specified behavioral and emotional disorders with onset usually occurring in childhood and adolescence: Secondary | ICD-10-CM

## 2019-06-03 MED ORDER — AMPHETAMINE-DEXTROAMPHETAMINE 30 MG PO TABS: 30.0000 mg | ORAL_TABLET | Freq: Two times a day (BID) | ORAL | 0 refills | Status: DC

## 2019-06-03 NOTE — Telephone Encounter (Signed)
Last Adderall RX:  02/25/19, #180. 1 tablet twice daily Last OV: 02/25/19 Next OV: 08/25/19 UDS: 03/17/19 CSC: 08/16/18

## 2019-06-04 ENCOUNTER — Other Ambulatory Visit: Payer: Self-pay

## 2019-06-04 ENCOUNTER — Ambulatory Visit (INDEPENDENT_AMBULATORY_CARE_PROVIDER_SITE_OTHER): Payer: BC Managed Care – PPO | Admitting: Psychiatry

## 2019-06-04 ENCOUNTER — Encounter (HOSPITAL_COMMUNITY): Payer: Self-pay | Admitting: Psychiatry

## 2019-06-04 DIAGNOSIS — F411 Generalized anxiety disorder: Secondary | ICD-10-CM | POA: Diagnosis not present

## 2019-06-04 DIAGNOSIS — F4010 Social phobia, unspecified: Secondary | ICD-10-CM | POA: Insufficient documentation

## 2019-06-04 DIAGNOSIS — F331 Major depressive disorder, recurrent, moderate: Secondary | ICD-10-CM | POA: Diagnosis not present

## 2019-06-04 DIAGNOSIS — F909 Attention-deficit hyperactivity disorder, unspecified type: Secondary | ICD-10-CM | POA: Diagnosis not present

## 2019-06-04 DIAGNOSIS — F320 Major depressive disorder, single episode, mild: Secondary | ICD-10-CM

## 2019-06-04 MED ORDER — VORTIOXETINE HBR 20 MG PO TABS: 30.0000 mg | ORAL_TABLET | Freq: Every day | ORAL | 0 refills | Status: DC
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
## 2019-07-29 ENCOUNTER — Other Ambulatory Visit: Payer: Self-pay | Admitting: Family Medicine

## 2019-07-29 DIAGNOSIS — M62838 Other muscle spasm: Secondary | ICD-10-CM

## 2019-07-29 DIAGNOSIS — R110 Nausea: Secondary | ICD-10-CM

## 2019-07-30 ENCOUNTER — Other Ambulatory Visit: Payer: Self-pay

## 2019-07-30 ENCOUNTER — Ambulatory Visit (INDEPENDENT_AMBULATORY_CARE_PROVIDER_SITE_OTHER): Payer: BC Managed Care – PPO | Admitting: Psychiatry

## 2019-07-30 DIAGNOSIS — F320 Major depressive disorder, single episode, mild: Secondary | ICD-10-CM | POA: Diagnosis not present

## 2019-07-30 DIAGNOSIS — F411 Generalized anxiety disorder: Secondary | ICD-10-CM

## 2019-07-30 DIAGNOSIS — F339 Major depressive disorder, recurrent, unspecified: Secondary | ICD-10-CM | POA: Diagnosis not present

## 2019-07-30 DIAGNOSIS — F330 Major depressive disorder, recurrent, mild: Secondary | ICD-10-CM

## 2019-07-30 DIAGNOSIS — F4010 Social phobia, unspecified: Secondary | ICD-10-CM

## 2019-07-30 DIAGNOSIS — F988 Other specified behavioral and emotional disorders with onset usually occurring in childhood and adolescence: Secondary | ICD-10-CM

## 2019-07-30 DIAGNOSIS — F909 Attention-deficit hyperactivity disorder, unspecified type: Secondary | ICD-10-CM

## 2019-07-30 MED ORDER — VORTIOXETINE HBR 10 MG PO TABS: 30.0000 mg | ORAL_TABLET | Freq: Every day | ORAL | 0 refills | Status: DC

## 2019-07-30 MED ORDER — AMPHETAMINE-DEXTROAMPHETAMINE 30 MG PO TABS: 30.0000 mg | ORAL_TABLET | Freq: Two times a day (BID) | ORAL | 0 refills | Status: DC
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
## 2019-08-02 ENCOUNTER — Other Ambulatory Visit: Payer: Self-pay | Admitting: Family Medicine

## 2019-08-02 DIAGNOSIS — G43809 Other migraine, not intractable, without status migrainosus: Secondary | ICD-10-CM

## 2019-08-02 DIAGNOSIS — R110 Nausea: Secondary | ICD-10-CM

## 2019-08-02 DIAGNOSIS — M62838 Other muscle spasm: Secondary | ICD-10-CM

## 2019-08-03 ENCOUNTER — Other Ambulatory Visit: Payer: Self-pay | Admitting: Family Medicine

## 2019-08-03 DIAGNOSIS — J069 Acute upper respiratory infection, unspecified: Secondary | ICD-10-CM

## 2019-08-05 NOTE — Telephone Encounter (Signed)
Refill request:  Tramadol 50mg  Last Refill: 05/12/2019, #30, 0 RF Last OV: 02/25/2019 Next OV:07/25/2019 Last UDS: 02/25/2019  Zanaflex 4mg   Last Refill: 05/22/2019, #90, 0 RF

## 2019-08-22 ENCOUNTER — Other Ambulatory Visit: Payer: Self-pay | Admitting: Family Medicine

## 2019-08-22 DIAGNOSIS — R110 Nausea: Secondary | ICD-10-CM

## 2019-08-22 DIAGNOSIS — M62838 Other muscle spasm: Secondary | ICD-10-CM

## 2019-08-25 ENCOUNTER — Ambulatory Visit: Payer: BLUE CROSS/BLUE SHIELD | Admitting: Family Medicine

## 2019-08-29 ENCOUNTER — Other Ambulatory Visit: Payer: Self-pay | Admitting: Family Medicine

## 2019-08-29 DIAGNOSIS — G43809 Other migraine, not intractable, without status migrainosus: Secondary | ICD-10-CM

## 2019-09-01 ENCOUNTER — Ambulatory Visit (HOSPITAL_COMMUNITY): Payer: BC Managed Care – PPO | Admitting: Psychiatry

## 2019-09-02 ENCOUNTER — Ambulatory Visit: Payer: BC Managed Care – PPO | Admitting: Family Medicine

## 2019-09-16 ENCOUNTER — Encounter: Payer: Self-pay | Admitting: Family Medicine

## 2019-09-16 ENCOUNTER — Other Ambulatory Visit: Payer: Self-pay

## 2019-09-16 ENCOUNTER — Other Ambulatory Visit: Payer: Self-pay | Admitting: Family Medicine

## 2019-09-16 ENCOUNTER — Ambulatory Visit: Payer: BC Managed Care – PPO | Admitting: Family Medicine

## 2019-09-16 VITALS — BP 120/76 | HR 88 | Temp 97.9°F | Resp 18 | Ht 67.0 in | Wt 281.8 lb

## 2019-09-16 DIAGNOSIS — M797 Fibromyalgia: Secondary | ICD-10-CM | POA: Diagnosis not present

## 2019-09-16 DIAGNOSIS — M7551 Bursitis of right shoulder: Secondary | ICD-10-CM

## 2019-09-16 DIAGNOSIS — M62838 Other muscle spasm: Secondary | ICD-10-CM

## 2019-09-16 DIAGNOSIS — R110 Nausea: Secondary | ICD-10-CM

## 2019-09-16 DIAGNOSIS — G43809 Other migraine, not intractable, without status migrainosus: Secondary | ICD-10-CM | POA: Diagnosis not present

## 2019-09-16 MED ORDER — PREDNISONE 10 MG PO TABS: ORAL_TABLET | ORAL | 0 refills | Status: DC

## 2019-09-16 MED ORDER — HYDROCODONE-ACETAMINOPHEN 10-325 MG PO TABS: 1.0000 | ORAL_TABLET | Freq: Four times a day (QID) | ORAL | 0 refills | Status: DC | PRN

## 2019-09-16 MED ORDER — TRAMADOL HCL 50 MG PO TABS: 50.0000 mg | ORAL_TABLET | Freq: Three times a day (TID) | ORAL | 0 refills | Status: DC | PRN

## 2019-09-16 NOTE — Patient Instructions (Signed)
Bursitis  Bursitis is inflammation and irritation of a bursa, which is one of the small, fluid-filled sacs that cushion and protect the moving parts of your body. These sacs are located between bones and muscles, bones and muscle attachments, or bones and skin areas that are next to bones. A bursa protects those structures from the wear and tear that results from frequent movement. An inflamed bursa causes pain and swelling. Fluid may build up inside the sac. Bursitis is most common near joints, especially the knees, elbows, hips, and shoulders. What are the causes? This condition may be caused by:  Injury from: ? A direct hit (blow), like falling on your knee or elbow. ? Overuse of a joint (repetitive stress).  Infection. This can happen if bacteria get into a bursa through a cut or scrape near a joint.  Diseases that cause joint inflammation, such as gout and rheumatoid arthritis. What increases the risk? You are more likely to develop this condition if you:  Have a job or hobby that involves a lot of repetitive stress on your joints.  Have a condition that weakens your body's defense system (immune system), such as diabetes, cancer, or HIV.  Do any of these often: ? Lift and reach overhead. ? Kneel or lean on hard surfaces. ? Run or walk. What are the signs or symptoms? The most common symptoms of this condition include:  Pain that gets worse when you move the affected body part or use it to support (bear) your body weight.  Inflammation.  Stiffness. Other symptoms include:  Redness.  Swelling.  Tenderness.  Warmth.  Pain that continues after rest.  Fever or chills. These may occur in bursitis that is caused by infection. How is this diagnosed? This condition may be diagnosed based on:  Your medical history and a physical exam.  Imaging tests, such as an MRI.  A procedure to drain fluid from the bursa with a needle (aspiration). The fluid may be checked for  signs of infection or gout.  Blood tests to rule out other causes of inflammation. How is this treated? This condition can usually be treated at home with rest, ice, applying pressure (compression), and raising the body part that is affected (elevation). This is called RICE therapy. For mild bursitis, RICE therapy may be all you need. Other treatments may include:  NSAIDs to treat pain and inflammation.  Corticosteroid medicines to fight inflammation. These medicines may be injected into and around the area of bursitis.  Aspiration of fluid from the bursa to relieve pain and improve movement.  Antibiotic medicine to treat an infected bursa.  A splint, brace, or walking aid, such as a cane.  Physical therapy if you continue to have pain or limited movement.  Surgery to remove a damaged or infected bursa. This may be needed if other treatments have not worked. Follow these instructions at home: Medicines  Take over-the-counter and prescription medicines only as told by your health care provider.  If you were prescribed an antibiotic medicine, take it as told by your health care provider. Do not stop taking the antibiotic even if you start to feel better. General instructions   Rest the affected area as told by your health care provider. ? If possible, raise (elevate) the affected area above the level of your heart while you are sitting or lying down. ? Avoid activities that make pain worse.  Use splints, braces, pads, or walking aids as told by your health care provider.  If   directed, put ice on the affected area: ? If you have a removable splint or brace, remove it as told by your health care provider. ? Put ice in a plastic bag. ? Place a towel between your skin and the bag or between your splint or brace and the bag. ? Leave the ice on for 20 minutes, 2-3 times a day.  Keep all follow-up visits as told by your health care provider. This is important. Preventing future  episodes Take actions to help prevent future episodes of bursitis.  Wear knee pads if you kneel often.  Wear sturdy running or walking shoes that fit you well.  Take breaks regularly from repetitive activity.  Warm up by stretching before doing any activity that takes a lot of effort.  Maintain a healthy weight or lose weight as recommended by your health care provider. If you need help doing this, ask your health care provider.  Exercise regularly. Start any new physical activity gradually. Contact a health care provider if you:  Have a fever.  Have chills.  Have bursitis that is not getting better with treatment or home care. Summary  Bursitis is inflammation and irritation of a bursa, which is one of the small, fluid-filled sacs that cushion and protect the moving parts of your body.  An inflamed bursa causes pain and swelling.  Bursitis is commonly diagnosed with a physical exam, but other tests are sometimes needed.  This condition can usually be treated at home with rest, ice, applying pressure (compression), and raising the body part that is affected (elevation). This is called RICE therapy. This information is not intended to replace advice given to you by your health care provider. Make sure you discuss any questions you have with your health care provider. Document Revised: 06/22/2017 Document Reviewed: 05/25/2017 Elsevier Patient Education  2020 [employer]  

## 2019-09-16 NOTE — Progress Notes (Signed)
Patient ID: Jo Jackson, female    DOB: 03-03-1971  Age: 49 y.o. MRN: [id number]    Subjective:  Subjective  HPI Jo Jackson presents for f/u chronic pain and inc pain with bursitis -- she needs to make a f/u with ortho but  She also need f/u add   Review of Systems  Constitutional: Negative for activity change, appetite change, chills, fatigue, fever and unexpected weight change.  HENT: Negative for congestion and hearing loss.   Eyes: Negative for discharge.  Respiratory: Negative for cough and shortness of breath.   Cardiovascular: Negative for chest pain, palpitations and leg swelling.  Gastrointestinal: Negative for abdominal pain, blood in stool, constipation, diarrhea, nausea and vomiting.  Genitourinary: Negative for dysuria, frequency, hematuria and urgency.  Musculoskeletal: Negative for back pain and myalgias.  Skin: Negative for rash.  Allergic/Immunologic: Negative for environmental allergies.  Neurological: Negative for dizziness, weakness and headaches.  Hematological: Does not bruise/bleed easily.  Psychiatric/Behavioral: Negative for behavioral problems, dysphoric mood and suicidal ideas. The patient is not nervous/anxious.     History Past Medical History:  Diagnosis Date  . Abnormal Pap smear    Repeats WNL  . ADD (attention deficit disorder)   . Anxiety   . Arthritis    Oseteoarthritis  . Depression   . GERD (gastroesophageal reflux disease)   . Headache(784.0)    Migraines  . Junctional escape rhythm   . Neuromuscular disorder (HCC)    Fibromyalgia  . Pregnancy induced hypertension   . Supraventricular tachycardia ([place])     She has a past surgical history that includes Appendectomy (1986); Carpal tunnel release (2000); Cervical fusion (08-24-99,04-26-00,05-09-01); Shoulder surgery (left-6-04, right 09-12-04); tongue polyp (1984); Ablation of dysrhythmic focus; and Knee sugery (Left).   Her family history includes ADD / ADHD in her brother;  Bipolar disorder in an other family member; COPD in her mother; Cancer in her paternal aunt; Depression in her mother and another family member; Diabetes in an other family member; Heart disease in her mother; Hodgkin's lymphoma in her father; Hypertension in an other family member; Thyroid disease in an other family member.She reports that she quit smoking about 11 years ago. She has never used smokeless tobacco. She reports current alcohol use. She reports that she does not use drugs.  Current Outpatient Medications on File Prior to Visit  Medication Sig Dispense Refill  . albuterol (PROAIR HFA) 108 (90 Base) MCG/ACT inhaler INHALE 2 PUFFS INTO LUNGS EVERY 6 HOURS AS NEEDED FOR WHEEZING OR SHORTNESS OF BREATH 8.5 Inhaler 2  . amphetamine-dextroamphetamine (ADDERALL) 30 MG tablet Take 1 tablet by mouth 2 (two) times daily. 180 tablet 0  . ARIPiprazole (ABILIFY) 5 MG tablet TAKE 1 TABLET BY MOUTH EVERY DAY 90 tablet 1  . Diclofenac Sodium (PENNSAID) 2 % SOLN 4 g qid prn 112 g 5  . mometasone (NASONEX) 50 MCG/ACT nasal spray PLACE 2 SPRAYS DAILY INTO THE NOSE. 17 g 3  . norethindrone (MICRONOR) 0.35 MG tablet TAKE 1 TABLET BY MOUTH EVERY DAY 84 tablet 1  . pantoprazole (PROTONIX) 40 MG tablet Take 1 tablet (40 mg total) by mouth daily. 90 tablet 1  . SUMAtriptan (IMITREX) 50 MG tablet TAKE 1 TABLET BY MOUTH ONCE, MAY REPEAT IN 2 HOURS IF HEADACHE PERSISTS OR RECURS 10 tablet 0  . tiZANidine (ZANAFLEX) 4 MG tablet TAKE 1 TABLET (4 MG TOTAL) BY MOUTH EVERY 6 (SIX) HOURS AS NEEDED FOR MUSCLE SPASMS. 90 tablet 1  . valACYclovir (VALTREX) 1000 MG tablet  TAKE 1 TABLET (1,000 MG TOTAL) BY MOUTH 3 (THREE) TIMES DAILY. 30 tablet 5  . vortioxetine HBr (TRINTELLIX) 10 MG TABS tablet Take 3 tablets (30 mg total) by mouth daily. 270 tablet 0   No current facility-administered medications on file prior to visit.     Objective:  Objective  Physical Exam Vitals and nursing note reviewed.  Constitutional:       Appearance: She is well-developed.  HENT:     Head: Normocephalic and atraumatic.  Eyes:     Conjunctiva/sclera: Conjunctivae normal.  Neck:     Thyroid: No thyromegaly.     Vascular: No carotid bruit or JVD.  Cardiovascular:     Rate and Rhythm: Normal rate and regular rhythm.     Heart sounds: Normal heart sounds. No murmur.  Pulmonary:     Effort: Pulmonary effort is normal. No respiratory distress.     Breath sounds: Normal breath sounds. No wheezing or rales.  Chest:     Chest wall: No tenderness.  Musculoskeletal:        General: Tenderness and signs of injury present.     Right shoulder: Tenderness present. Decreased range of motion.     Left shoulder: Normal.     Cervical back: Normal range of motion and neck supple.  Neurological:     Mental Status: She is alert and oriented to person, place, and time.    BP 120/76 (BP Location: Left Arm, Patient Position: Sitting, Cuff Size: Large)   Pulse 88   Temp 97.9 F (36.6 C) (Temporal)   Resp 18   Ht 5\' 7"  (1.702 m)   Wt 281 lb 12.8 oz (127.8 kg)   SpO2 96%   BMI 44.14 kg/m  Wt Readings from Last 3 Encounters:  09/16/19 281 lb 12.8 oz (127.8 kg)  02/25/19 287 lb 3.2 oz (130.3 kg)  08/16/18 293 lb (132.9 kg)     Lab Results  Component Value Date   WBC 5.6 02/25/2019   HGB 13.7 02/25/2019   HCT 39.9 02/25/2019   PLT 231.0 02/25/2019   GLUCOSE 89 02/25/2019   CHOL 154 02/25/2019   TRIG 50.0 02/25/2019   HDL 63.20 02/25/2019   LDLCALC 81 02/25/2019   ALT 16 02/25/2019   AST 15 02/25/2019   NA 138 02/25/2019   K 3.7 02/25/2019   CL 102 02/25/2019   CREATININE 0.75 02/25/2019   BUN 14 02/25/2019   CO2 28 02/25/2019   TSH 2.91 02/25/2019   HGBA1C 5.4 06/06/2013    06/08/2013 BREAST LTD UNI LEFT INC AXILLA  Result Date: 04/09/2018 CLINICAL DATA:  Screening recall for a left breast asymmetry. EXAM: DIGITAL DIAGNOSTIC LEFT MAMMOGRAM WITH CAD AND TOMO ULTRASOUND LEFT BREAST COMPARISON:  Previous exam(s). ACR Breast  Density Category c: The breast tissue is heterogeneously dense, which may obscure small masses. FINDINGS: Spot compression tomosynthesis images through the area of concern in the superior anterior left breast demonstrates no definite persistent asymmetry. However, due to the density of breast tissue in this region, ultrasound will be performed for further evaluation. Mammographic images were processed with CAD. Ultrasound targeted to the superior left breast at 12 o'clock demonstrates normal fibroglandular tissue. No suspicious masses or areas of shadowing are identified. IMPRESSION: Resolution of the left breast asymmetry consistent with overlapping fibroglandular tissue. RECOMMENDATION: Screening mammogram in one year.(Code:SM-B-01Y) I have discussed the findings and recommendations with the patient. Results were also provided in writing at the conclusion of the visit. If applicable, a reminder letter  will be sent to the patient regarding the next appointment. BI-RADS CATEGORY  1: Negative. Electronically Signed   By: Frederico Hamman M.D.   On: 04/09/2018 09:58   MM DIAG BREAST TOMO UNI LEFT  Result Date: 04/09/2018 CLINICAL DATA:  Screening recall for a left breast asymmetry. EXAM: DIGITAL DIAGNOSTIC LEFT MAMMOGRAM WITH CAD AND TOMO ULTRASOUND LEFT BREAST COMPARISON:  Previous exam(s). ACR Breast Density Category c: The breast tissue is heterogeneously dense, which may obscure small masses. FINDINGS: Spot compression tomosynthesis images through the area of concern in the superior anterior left breast demonstrates no definite persistent asymmetry. However, due to the density of breast tissue in this region, ultrasound will be performed for further evaluation. Mammographic images were processed with CAD. Ultrasound targeted to the superior left breast at 12 o'clock demonstrates normal fibroglandular tissue. No suspicious masses or areas of shadowing are identified. IMPRESSION: Resolution of the left breast  asymmetry consistent with overlapping fibroglandular tissue. RECOMMENDATION: Screening mammogram in one year.(Code:SM-B-01Y) I have discussed the findings and recommendations with the patient. Results were also provided in writing at the conclusion of the visit. If applicable, a reminder letter will be sent to the patient regarding the next appointment. BI-RADS CATEGORY  1: Negative. Electronically Signed   By: Frederico Hamman M.D.   On: 04/09/2018 09:58     Assessment & Plan:  Plan  I am having Jo Jackson start on predniSONE. I am also having her maintain her Diclofenac Sodium, valACYclovir, albuterol, pantoprazole, norethindrone, ARIPiprazole, vortioxetine HBr, amphetamine-dextroamphetamine, mometasone, tiZANidine, SUMAtriptan, ondansetron, traMADol, and HYDROcodone-acetaminophen.  Meds ordered this encounter  Medications  . traMADol (ULTRAM) 50 MG tablet    Sig: Take 1 tablet (50 mg total) by mouth every 8 (eight) hours as needed.    Dispense:  30 tablet    Refill:  0    Not to exceed 5 additional fills before 11/08/2019  . HYDROcodone-acetaminophen (NORCO) 10-325 MG tablet    Sig: Take 1 tablet by mouth every 6 (six) hours as needed.    Dispense:  30 tablet    Refill:  0  . predniSONE (DELTASONE) 10 MG tablet    Sig: TAKE 3 TABLETS PO QD FOR 3 DAYS THEN TAKE 2 TABLETS PO QD FOR 3 DAYS THEN TAKE 1 TABLET PO QD FOR 3 DAYS THEN TAKE 1/2 TAB PO QD FOR 3 DAYS    Dispense:  20 tablet    Refill:  0    Problem List Items Addressed This Visit      Unprioritized   Fibromyalgia   Relevant Medications   traMADol (ULTRAM) 50 MG tablet   HYDROcodone-acetaminophen (NORCO) 10-325 MG tablet   predniSONE (DELTASONE) 10 MG tablet    Other Visit Diagnoses    Bursitis of right shoulder    -  Primary   Relevant Medications   predniSONE (DELTASONE) 10 MG tablet   Other migraine without status migrainosus, not intractable       Relevant Medications   traMADol (ULTRAM) 50 MG tablet    HYDROcodone-acetaminophen (NORCO) 10-325 MG tablet    uds utd Contract updated today Database reviewed  Follow-up: Return in about 3 months (around 12/14/2019), or if symptoms worsen or fail to improve.  [name], DO

## 2019-09-23 ENCOUNTER — Other Ambulatory Visit: Payer: Self-pay

## 2019-09-23 ENCOUNTER — Ambulatory Visit (INDEPENDENT_AMBULATORY_CARE_PROVIDER_SITE_OTHER): Payer: BC Managed Care – PPO | Admitting: Psychiatry

## 2019-09-23 DIAGNOSIS — F320 Major depressive disorder, single episode, mild: Secondary | ICD-10-CM

## 2019-09-23 DIAGNOSIS — F988 Other specified behavioral and emotional disorders with onset usually occurring in childhood and adolescence: Secondary | ICD-10-CM | POA: Diagnosis not present

## 2019-09-23 DIAGNOSIS — F330 Major depressive disorder, recurrent, mild: Secondary | ICD-10-CM

## 2019-09-23 DIAGNOSIS — F909 Attention-deficit hyperactivity disorder, unspecified type: Secondary | ICD-10-CM | POA: Diagnosis not present

## 2019-09-23 DIAGNOSIS — F339 Major depressive disorder, recurrent, unspecified: Secondary | ICD-10-CM

## 2019-09-23 DIAGNOSIS — F411 Generalized anxiety disorder: Secondary | ICD-10-CM

## 2019-09-23 DIAGNOSIS — F4010 Social phobia, unspecified: Secondary | ICD-10-CM

## 2019-09-23 MED ORDER — ARIPIPRAZOLE 5 MG PO TABS: 5.0000 mg | ORAL_TABLET | Freq: Every day | ORAL | 1 refills | Status: DC

## 2019-09-23 MED ORDER — AMPHETAMINE-DEXTROAMPHETAMINE 30 MG PO TABS: 30.0000 mg | ORAL_TABLET | Freq: Two times a day (BID) | ORAL | 0 refills | Status: DC

## 2019-09-23 MED ORDER — VORTIOXETINE HBR 10 MG PO TABS: 30.0000 mg | ORAL_TABLET | Freq: Every day | ORAL | 0 refills | Status: DC
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
## 2019-10-16 ENCOUNTER — Other Ambulatory Visit: Payer: Self-pay | Admitting: Family Medicine

## 2019-10-26 ENCOUNTER — Other Ambulatory Visit (HOSPITAL_COMMUNITY): Payer: Self-pay | Admitting: Psychiatry

## 2019-10-26 DIAGNOSIS — F320 Major depressive disorder, single episode, mild: Secondary | ICD-10-CM

## 2019-10-29 ENCOUNTER — Other Ambulatory Visit: Payer: Self-pay | Admitting: Family Medicine

## 2019-10-29 DIAGNOSIS — J069 Acute upper respiratory infection, unspecified: Secondary | ICD-10-CM

## 2019-10-29 DIAGNOSIS — K219 Gastro-esophageal reflux disease without esophagitis: Secondary | ICD-10-CM

## 2019-11-03 ENCOUNTER — Other Ambulatory Visit: Payer: Self-pay | Admitting: Family Medicine

## 2019-11-03 DIAGNOSIS — G43809 Other migraine, not intractable, without status migrainosus: Secondary | ICD-10-CM

## 2019-11-03 DIAGNOSIS — M797 Fibromyalgia: Secondary | ICD-10-CM

## 2019-11-04 MED ORDER — HYDROCODONE-ACETAMINOPHEN 10-325 MG PO TABS: 1.0000 | ORAL_TABLET | Freq: Four times a day (QID) | ORAL | 0 refills | Status: DC | PRN

## 2019-11-04 MED ORDER — TRAMADOL HCL 50 MG PO TABS: 50.0000 mg | ORAL_TABLET | Freq: Three times a day (TID) | ORAL | 0 refills | Status: DC | PRN

## 2019-11-04 NOTE — Telephone Encounter (Signed)
Requesting:Norco & Tramadol Contract:yes UDS:low risk  Last OV:09/23/19 Next OV:12/15/19 Last Refill: Norco 09/16/19 #[id number] Tramadol 09/16/19 #[id number] Database:   Please advise

## 2019-11-25 ENCOUNTER — Other Ambulatory Visit: Payer: Self-pay

## 2019-11-25 ENCOUNTER — Telehealth (INDEPENDENT_AMBULATORY_CARE_PROVIDER_SITE_OTHER): Payer: BC Managed Care – PPO | Admitting: Psychiatry

## 2019-11-25 DIAGNOSIS — F3341 Major depressive disorder, recurrent, in partial remission: Secondary | ICD-10-CM | POA: Diagnosis not present

## 2019-11-25 DIAGNOSIS — F411 Generalized anxiety disorder: Secondary | ICD-10-CM

## 2019-11-25 DIAGNOSIS — F909 Attention-deficit hyperactivity disorder, unspecified type: Secondary | ICD-10-CM | POA: Diagnosis not present

## 2019-11-25 DIAGNOSIS — F4010 Social phobia, unspecified: Secondary | ICD-10-CM
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
## 2019-11-27 ENCOUNTER — Other Ambulatory Visit: Payer: Self-pay | Admitting: Family Medicine

## 2019-11-27 DIAGNOSIS — G43809 Other migraine, not intractable, without status migrainosus: Secondary | ICD-10-CM

## 2019-11-27 DIAGNOSIS — M797 Fibromyalgia: Secondary | ICD-10-CM

## 2019-11-27 MED ORDER — TRAMADOL HCL 50 MG PO TABS: 50.0000 mg | ORAL_TABLET | Freq: Three times a day (TID) | ORAL | 0 refills | Status: DC | PRN

## 2019-11-27 MED ORDER — HYDROCODONE-ACETAMINOPHEN 10-325 MG PO TABS: 1.0000 | ORAL_TABLET | Freq: Four times a day (QID) | ORAL | 0 refills | Status: DC | PRN

## 2019-11-27 NOTE — Telephone Encounter (Signed)
Can you refill in PCP absence?

## 2019-12-09 ENCOUNTER — Other Ambulatory Visit: Payer: Self-pay | Admitting: Family Medicine

## 2019-12-09 DIAGNOSIS — J111 Influenza due to unidentified influenza virus with other respiratory manifestations: Secondary | ICD-10-CM

## 2019-12-09 DIAGNOSIS — M62838 Other muscle spasm: Secondary | ICD-10-CM

## 2019-12-09 DIAGNOSIS — R110 Nausea: Secondary | ICD-10-CM

## 2019-12-15 ENCOUNTER — Ambulatory Visit: Payer: BC Managed Care – PPO | Admitting: Family Medicine

## 2019-12-15 ENCOUNTER — Encounter: Payer: Self-pay | Admitting: Family Medicine

## 2019-12-15 ENCOUNTER — Other Ambulatory Visit: Payer: Self-pay

## 2019-12-15 VITALS — BP 138/90 | HR 98 | Temp 97.7°F | Resp 18 | Ht 67.0 in | Wt 277.2 lb

## 2019-12-15 DIAGNOSIS — F909 Attention-deficit hyperactivity disorder, unspecified type: Secondary | ICD-10-CM | POA: Diagnosis not present

## 2019-12-15 DIAGNOSIS — R030 Elevated blood-pressure reading, without diagnosis of hypertension: Secondary | ICD-10-CM | POA: Diagnosis not present

## 2019-12-15 DIAGNOSIS — R41840 Attention and concentration deficit: Secondary | ICD-10-CM | POA: Diagnosis not present

## 2019-12-15 NOTE — Patient Instructions (Signed)

## 2019-12-15 NOTE — Progress Notes (Signed)
Patient ID: Jo Jackson, female    DOB: Oct 26, 1970  Age: 49 y.o. MRN: [id number]    Subjective:  Subjective  HPI Kea Callan presents for f/u add and chronic pain      Pt is under a lot of stress due to her husband being in the hospital with covid and hodgkins   Review of Systems  Constitutional: Negative for appetite change, diaphoresis, fatigue and unexpected weight change.  Eyes: Negative for pain, redness and visual disturbance.  Respiratory: Negative for cough, chest tightness, shortness of breath and wheezing.   Cardiovascular: Negative for chest pain, palpitations and leg swelling.  Endocrine: Negative for cold intolerance, heat intolerance, polydipsia, polyphagia and polyuria.  Genitourinary: Negative for difficulty urinating, dysuria and frequency.  Neurological: Negative for dizziness, light-headedness, numbness and headaches.    History Past Medical History:  Diagnosis Date  . Abnormal Pap smear    Repeats WNL  . ADD (attention deficit disorder)   . Anxiety   . Arthritis    Oseteoarthritis  . Depression   . GERD (gastroesophageal reflux disease)   . Headache(784.0)    Migraines  . Junctional escape rhythm   . Neuromuscular disorder (HCC)    Fibromyalgia  . Pregnancy induced hypertension   . Supraventricular tachycardia (HCC)     She has a past surgical history that includes Appendectomy (1986); Carpal tunnel release (2000); Cervical fusion (08-24-99,04-26-00,05-09-01); Shoulder surgery (left-6-04, right 09-12-04); tongue polyp (1984); Ablation of dysrhythmic focus; and Knee sugery (Left).   Her family history includes ADD / ADHD in her brother; Bipolar disorder in an other family member; COPD in her mother; Cancer in her paternal aunt; Depression in her mother and another family member; Diabetes in an other family member; Heart disease in her mother; Hodgkin's lymphoma in her father; Hypertension in an other family member; Thyroid disease in an other  family member.She reports that she quit smoking about 12 years ago. She has never used smokeless tobacco. She reports current alcohol use. She reports that she does not use drugs.  Current Outpatient Medications on File Prior to Visit  Medication Sig Dispense Refill  . albuterol (VENTOLIN HFA) 108 (90 Base) MCG/ACT inhaler INHALE 2 PUFFS INTO LUNGS EVERY 6 HOURS AS NEEDED FOR WHEEZING OR SHORTNESS OF BREATH 18 g 2  . amphetamine-dextroamphetamine (ADDERALL) 30 MG tablet Take 1 tablet by mouth 2 (two) times daily. 180 tablet 0  . ARIPiprazole (ABILIFY) 5 MG tablet Take 1 tablet (5 mg total) by mouth daily. 90 tablet 1  . Diclofenac Sodium (PENNSAID) 2 % SOLN 4 g qid prn 112 g 5  . HYDROcodone-acetaminophen (NORCO) 10-325 MG tablet Take 1 tablet by mouth every 6 (six) hours as needed. 30 tablet 0  . mometasone (NASONEX) 50 MCG/ACT nasal spray Place 2 sprays into the nose daily. 17 g 12  . norethindrone (MICRONOR) 0.35 MG tablet TAKE 1 TABLET BY MOUTH EVERY DAY 84 tablet 1  . ondansetron (ZOFRAN-ODT) 8 MG disintegrating tablet TAKE 1 TABLET (8 MG TOTAL) BY MOUTH EVERY 8 (EIGHT) HOURS AS NEEDED FOR NAUSEA OR VOMITING. 20 tablet 1  . pantoprazole (PROTONIX) 40 MG tablet Take 1 tablet (40 mg total) by mouth daily. 90 tablet 3  . SUMAtriptan (IMITREX) 50 MG tablet TAKE 1 TABLET BY MOUTH ONCE, MAY REPEAT IN 2 HOURS IF HEADACHE PERSISTS OR RECURS 10 tablet 0  . tiZANidine (ZANAFLEX) 4 MG tablet TAKE 1 TABLET (4 MG TOTAL) BY MOUTH EVERY 6 (SIX) HOURS AS NEEDED FOR MUSCLE SPASMS. 90  tablet 1  . traMADol (ULTRAM) 50 MG tablet Take 1 tablet (50 mg total) by mouth every 8 (eight) hours as needed. 30 tablet 0  . TRINTELLIX 10 MG TABS tablet TAKE 3 TABLETS BY MOUTH EVERY DAY 270 tablet 0  . valACYclovir (VALTREX) 1000 MG tablet TAKE 1 TABLET (1,000 MG TOTAL) BY MOUTH 3 (THREE) TIMES DAILY. 30 tablet 5   No current facility-administered medications on file prior to visit.     Objective:  Objective  Physical  Exam Vitals and nursing note reviewed.  Constitutional:      Appearance: She is well-developed.  HENT:     Head: Normocephalic and atraumatic.  Eyes:     Conjunctiva/sclera: Conjunctivae normal.  Neck:     Thyroid: No thyromegaly.     Vascular: No carotid bruit or JVD.  Cardiovascular:     Rate and Rhythm: Normal rate and regular rhythm.     Heart sounds: Normal heart sounds. No murmur.  Pulmonary:     Effort: Pulmonary effort is normal. No respiratory distress.     Breath sounds: Normal breath sounds. No wheezing or rales.  Chest:     Chest wall: No tenderness.  Musculoskeletal:     Cervical back: Normal range of motion and neck supple.  Neurological:     Mental Status: She is alert and oriented to person, place, and time.    BP (!) 142/98 (BP Location: Left Arm, Patient Position: Sitting, Cuff Size: Large)   Pulse (!) 110   Temp 97.7 F (36.5 C) (Temporal)   Resp 18   Ht 5\' 7"  (1.702 m)   Wt 277 lb 3.2 oz (125.7 kg)   SpO2 95%   BMI 43.42 kg/m  Wt Readings from Last 3 Encounters:  12/15/19 277 lb 3.2 oz (125.7 kg)  09/16/19 281 lb 12.8 oz (127.8 kg)  02/25/19 287 lb 3.2 oz (130.3 kg)     Lab Results  Component Value Date   WBC 5.6 02/25/2019   HGB 13.7 02/25/2019   HCT 39.9 02/25/2019   PLT 231.0 02/25/2019   GLUCOSE 89 02/25/2019   CHOL 154 02/25/2019   TRIG 50.0 02/25/2019   HDL 63.20 02/25/2019   LDLCALC 81 02/25/2019   ALT 16 02/25/2019   AST 15 02/25/2019   NA 138 02/25/2019   K 3.7 02/25/2019   CL 102 02/25/2019   CREATININE 0.75 02/25/2019   BUN 14 02/25/2019   CO2 28 02/25/2019   TSH 2.91 02/25/2019   HGBA1C 5.4 06/06/2013    06/08/2013 BREAST LTD UNI LEFT INC AXILLA  Result Date: 04/09/2018 CLINICAL DATA:  Screening recall for a left breast asymmetry. EXAM: DIGITAL DIAGNOSTIC LEFT MAMMOGRAM WITH CAD AND TOMO ULTRASOUND LEFT BREAST COMPARISON:  Previous exam(s). ACR Breast Density Category c: The breast tissue is heterogeneously dense, which may  obscure small masses. FINDINGS: Spot compression tomosynthesis images through the area of concern in the superior anterior left breast demonstrates no definite persistent asymmetry. However, due to the density of breast tissue in this region, ultrasound will be performed for further evaluation. Mammographic images were processed with CAD. Ultrasound targeted to the superior left breast at 12 o'clock demonstrates normal fibroglandular tissue. No suspicious masses or areas of shadowing are identified. IMPRESSION: Resolution of the left breast asymmetry consistent with overlapping fibroglandular tissue. RECOMMENDATION: Screening mammogram in one year.(Code:SM-B-01Y) I have discussed the findings and recommendations with the patient. Results were also provided in writing at the conclusion of the visit. If applicable, a reminder letter  will be sent to the patient regarding the next appointment. BI-RADS CATEGORY  1: Negative. Electronically Signed   By: Ammie Ferrier M.D.   On: 04/09/2018 09:58   MM DIAG BREAST TOMO UNI LEFT  Result Date: 04/09/2018 CLINICAL DATA:  Screening recall for a left breast asymmetry. EXAM: DIGITAL DIAGNOSTIC LEFT MAMMOGRAM WITH CAD AND TOMO ULTRASOUND LEFT BREAST COMPARISON:  Previous exam(s). ACR Breast Density Category c: The breast tissue is heterogeneously dense, which may obscure small masses. FINDINGS: Spot compression tomosynthesis images through the area of concern in the superior anterior left breast demonstrates no definite persistent asymmetry. However, due to the density of breast tissue in this region, ultrasound will be performed for further evaluation. Mammographic images were processed with CAD. Ultrasound targeted to the superior left breast at 12 o'clock demonstrates normal fibroglandular tissue. No suspicious masses or areas of shadowing are identified. IMPRESSION: Resolution of the left breast asymmetry consistent with overlapping fibroglandular tissue.  RECOMMENDATION: Screening mammogram in one year.(Code:SM-B-01Y) I have discussed the findings and recommendations with the patient. Results were also provided in writing at the conclusion of the visit. If applicable, a reminder letter will be sent to the patient regarding the next appointment. BI-RADS CATEGORY  1: Negative. Electronically Signed   By: Ammie Ferrier M.D.   On: 04/09/2018 09:58     Assessment & Plan:  Plan  I have discontinued Shalva Tirey's predniSONE. I am also having her maintain her Diclofenac Sodium, valACYclovir, tiZANidine, SUMAtriptan, ARIPiprazole, amphetamine-dextroamphetamine, norethindrone, Trintellix, pantoprazole, mometasone, traMADol, HYDROcodone-acetaminophen, ondansetron, and albuterol.  No orders of the defined types were placed in this encounter.   Problem List Items Addressed This Visit    None      Follow-up: No follow-ups on file.  Ann Held, DO

## 2019-12-15 NOTE — Assessment & Plan Note (Signed)
uds / contract utd Database reviewed  con't meds  F/u 6 months

## 2019-12-15 NOTE — Assessment & Plan Note (Signed)
Dash diet  F/u 2 weeks

## 2019-12-30 ENCOUNTER — Ambulatory Visit: Payer: BC Managed Care – PPO

## 2019-12-30 ENCOUNTER — Other Ambulatory Visit: Payer: Self-pay | Admitting: Family Medicine

## 2019-12-30 DIAGNOSIS — G43809 Other migraine, not intractable, without status migrainosus: Secondary | ICD-10-CM

## 2019-12-30 DIAGNOSIS — M797 Fibromyalgia: Secondary | ICD-10-CM

## 2019-12-31 MED ORDER — HYDROCODONE-ACETAMINOPHEN 10-325 MG PO TABS: 1.0000 | ORAL_TABLET | Freq: Four times a day (QID) | ORAL | 0 refills | Status: DC | PRN

## 2019-12-31 MED ORDER — SUMATRIPTAN SUCCINATE 50 MG PO TABS: ORAL_TABLET | ORAL | 3 refills | Status: DC

## 2019-12-31 MED ORDER — TRAMADOL HCL 50 MG PO TABS: 50.0000 mg | ORAL_TABLET | Freq: Three times a day (TID) | ORAL | 0 refills | Status: DC | PRN

## 2019-12-31 NOTE — Telephone Encounter (Signed)
Tramadol and hydrocodone refill.   Last OV: 12/15/2019 Last Fill on hydrocodone: 11/27/2019 #30 and 0RF Pt sig: 1 tab q6h prn Last Fill on Tramadol: 11/27/2019 #30 and 0RF Pt sig: 1 tab q8h prn UDS: 02/25/2019 Low risk

## 2020-01-01 ENCOUNTER — Ambulatory Visit: Payer: BC Managed Care – PPO

## 2020-01-06 ENCOUNTER — Ambulatory Visit: Payer: BC Managed Care – PPO

## 2020-01-09 ENCOUNTER — Ambulatory Visit: Payer: BC Managed Care – PPO

## 2020-01-22 ENCOUNTER — Other Ambulatory Visit: Payer: Self-pay

## 2020-01-22 ENCOUNTER — Telehealth (INDEPENDENT_AMBULATORY_CARE_PROVIDER_SITE_OTHER): Payer: BC Managed Care – PPO | Admitting: Psychiatry

## 2020-01-22 DIAGNOSIS — F3342 Major depressive disorder, recurrent, in full remission: Secondary | ICD-10-CM

## 2020-01-22 DIAGNOSIS — F411 Generalized anxiety disorder: Secondary | ICD-10-CM

## 2020-01-22 DIAGNOSIS — F909 Attention-deficit hyperactivity disorder, unspecified type: Secondary | ICD-10-CM

## 2020-01-22 DIAGNOSIS — F320 Major depressive disorder, single episode, mild: Secondary | ICD-10-CM | POA: Diagnosis not present

## 2020-01-22 DIAGNOSIS — F988 Other specified behavioral and emotional disorders with onset usually occurring in childhood and adolescence: Secondary | ICD-10-CM

## 2020-01-22 MED ORDER — AMPHETAMINE-DEXTROAMPHETAMINE 30 MG PO TABS: 30.0000 mg | ORAL_TABLET | Freq: Two times a day (BID) | ORAL | 0 refills | Status: DC

## 2020-01-22 MED ORDER — VORTIOXETINE HBR 10 MG PO TABS: 30.0000 mg | ORAL_TABLET | Freq: Every day | ORAL | 0 refills | Status: DC
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
## 2020-02-05 ENCOUNTER — Other Ambulatory Visit: Payer: Self-pay | Admitting: Family Medicine

## 2020-02-05 DIAGNOSIS — R110 Nausea: Secondary | ICD-10-CM

## 2020-02-05 DIAGNOSIS — M62838 Other muscle spasm: Secondary | ICD-10-CM

## 2020-02-20 ENCOUNTER — Other Ambulatory Visit: Payer: Self-pay | Admitting: Family Medicine

## 2020-02-20 DIAGNOSIS — M797 Fibromyalgia: Secondary | ICD-10-CM

## 2020-02-20 DIAGNOSIS — G43809 Other migraine, not intractable, without status migrainosus: Secondary | ICD-10-CM

## 2020-02-21 MED ORDER — TRAMADOL HCL 50 MG PO TABS: 50.0000 mg | ORAL_TABLET | Freq: Three times a day (TID) | ORAL | 0 refills | Status: DC | PRN

## 2020-02-21 MED ORDER — HYDROCODONE-ACETAMINOPHEN 10-325 MG PO TABS: 1.0000 | ORAL_TABLET | Freq: Four times a day (QID) | ORAL | 0 refills | Status: DC | PRN

## 2020-02-21 NOTE — Telephone Encounter (Signed)
Requesting: Hydrocodone and Tramadol  Contract: 10/23/2019 UDS:02/25/2019 Last Visit:12/15/2019 Next Visit:06/21/2020 Last Refill:12/31/2019  Please Advise

## 2020-02-29 ENCOUNTER — Other Ambulatory Visit: Payer: Self-pay | Admitting: Family Medicine

## 2020-02-29 DIAGNOSIS — J111 Influenza due to unidentified influenza virus with other respiratory manifestations: Secondary | ICD-10-CM

## 2020-03-13 ENCOUNTER — Encounter: Payer: Self-pay | Admitting: Family Medicine

## 2020-03-15 NOTE — Telephone Encounter (Signed)
Nix should be fine

## 2020-03-21 ENCOUNTER — Other Ambulatory Visit: Payer: Self-pay | Admitting: Family Medicine

## 2020-03-21 ENCOUNTER — Other Ambulatory Visit (HOSPITAL_COMMUNITY): Payer: Self-pay | Admitting: Psychiatry

## 2020-03-21 DIAGNOSIS — M62838 Other muscle spasm: Secondary | ICD-10-CM

## 2020-03-21 DIAGNOSIS — R110 Nausea: Secondary | ICD-10-CM

## 2020-03-21 DIAGNOSIS — F339 Major depressive disorder, recurrent, unspecified: Secondary | ICD-10-CM

## 2020-03-28 ENCOUNTER — Encounter: Payer: Self-pay | Admitting: Family Medicine

## 2020-03-30 MED ORDER — VALACYCLOVIR HCL 1 G PO TABS: ORAL_TABLET | ORAL | 5 refills | Status: DC

## 2020-04-05 ENCOUNTER — Other Ambulatory Visit: Payer: Self-pay | Admitting: Family Medicine

## 2020-04-05 ENCOUNTER — Encounter: Payer: Self-pay | Admitting: Family Medicine

## 2020-04-12 ENCOUNTER — Other Ambulatory Visit: Payer: Self-pay | Admitting: Family Medicine

## 2020-04-12 MED ORDER — SAXENDA 18 MG/3ML ~~LOC~~ SOPN: 3.0000 mg | PEN_INJECTOR | Freq: Every day | SUBCUTANEOUS | 3 refills | Status: DC

## 2020-04-13 NOTE — Telephone Encounter (Signed)
Please advise 

## 2020-04-23 ENCOUNTER — Telehealth (INDEPENDENT_AMBULATORY_CARE_PROVIDER_SITE_OTHER): Payer: BC Managed Care – PPO | Admitting: Psychiatry

## 2020-04-23 ENCOUNTER — Other Ambulatory Visit: Payer: Self-pay

## 2020-04-23 DIAGNOSIS — F909 Attention-deficit hyperactivity disorder, unspecified type: Secondary | ICD-10-CM

## 2020-04-23 DIAGNOSIS — F3342 Major depressive disorder, recurrent, in full remission: Secondary | ICD-10-CM

## 2020-04-23 DIAGNOSIS — F320 Major depressive disorder, single episode, mild: Secondary | ICD-10-CM

## 2020-04-23 DIAGNOSIS — F988 Other specified behavioral and emotional disorders with onset usually occurring in childhood and adolescence: Secondary | ICD-10-CM

## 2020-04-23 DIAGNOSIS — F411 Generalized anxiety disorder: Secondary | ICD-10-CM

## 2020-04-23 MED ORDER — AMPHETAMINE-DEXTROAMPHETAMINE 30 MG PO TABS: 30.0000 mg | ORAL_TABLET | Freq: Two times a day (BID) | ORAL | 0 refills | Status: DC

## 2020-04-23 MED ORDER — VORTIOXETINE HBR 10 MG PO TABS: 30.0000 mg | ORAL_TABLET | Freq: Every day | ORAL | 0 refills | Status: DC
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
## 2020-04-27 ENCOUNTER — Other Ambulatory Visit: Payer: Self-pay | Admitting: Family Medicine

## 2020-04-27 DIAGNOSIS — M62838 Other muscle spasm: Secondary | ICD-10-CM

## 2020-04-27 DIAGNOSIS — R110 Nausea: Secondary | ICD-10-CM

## 2020-05-26 ENCOUNTER — Other Ambulatory Visit: Payer: Self-pay | Admitting: Family Medicine

## 2020-05-26 DIAGNOSIS — G43809 Other migraine, not intractable, without status migrainosus: Secondary | ICD-10-CM

## 2020-05-26 DIAGNOSIS — M797 Fibromyalgia: Secondary | ICD-10-CM

## 2020-05-26 NOTE — Telephone Encounter (Signed)
Requesting: hydrocodone and tramadol Contract: 09/16/2019 UDS: 02/25/2019 Last Visit: 12/15/2019  Next Visit: 06/21/2020 Last Refill on both: 02/21/2020 #30 and 0RF   Please Advise

## 2020-05-28 ENCOUNTER — Other Ambulatory Visit: Payer: Self-pay | Admitting: Family Medicine

## 2020-05-28 DIAGNOSIS — G43809 Other migraine, not intractable, without status migrainosus: Secondary | ICD-10-CM

## 2020-05-28 MED ORDER — TRAMADOL HCL 50 MG PO TABS: 50.0000 mg | ORAL_TABLET | Freq: Three times a day (TID) | ORAL | 0 refills | Status: DC | PRN

## 2020-05-28 MED ORDER — HYDROCODONE-ACETAMINOPHEN 10-325 MG PO TABS
1.0000 | ORAL_TABLET | Freq: Four times a day (QID) | ORAL | 0 refills | Status: DC | PRN
Start: 2020-05-28 — End: 2020-09-02

## 2020-06-19 ENCOUNTER — Encounter: Payer: Self-pay | Admitting: Family Medicine

## 2020-06-21 ENCOUNTER — Ambulatory Visit: Payer: BC Managed Care – PPO | Admitting: Family Medicine

## 2020-06-21 NOTE — Telephone Encounter (Signed)
Noted  

## 2020-07-23 ENCOUNTER — Other Ambulatory Visit: Payer: Self-pay | Admitting: Family Medicine

## 2020-07-23 DIAGNOSIS — R110 Nausea: Secondary | ICD-10-CM

## 2020-07-23 DIAGNOSIS — J111 Influenza due to unidentified influenza virus with other respiratory manifestations: Secondary | ICD-10-CM

## 2020-07-23 DIAGNOSIS — M62838 Other muscle spasm: Secondary | ICD-10-CM

## 2020-07-28 ENCOUNTER — Other Ambulatory Visit: Payer: Self-pay

## 2020-07-28 ENCOUNTER — Telehealth (INDEPENDENT_AMBULATORY_CARE_PROVIDER_SITE_OTHER): Payer: BC Managed Care – PPO | Admitting: Family Medicine

## 2020-07-28 ENCOUNTER — Encounter: Payer: Self-pay | Admitting: Family Medicine

## 2020-07-28 DIAGNOSIS — J0140 Acute pansinusitis, unspecified: Secondary | ICD-10-CM

## 2020-07-28 MED ORDER — FLUCONAZOLE 150 MG PO TABS: ORAL_TABLET | ORAL | 0 refills | Status: DC

## 2020-07-28 MED ORDER — AMOXICILLIN-POT CLAVULANATE 875-125 MG PO TABS: 1.0000 | ORAL_TABLET | Freq: Two times a day (BID) | ORAL | 0 refills | Status: DC

## 2020-07-28 NOTE — Progress Notes (Signed)
Virtual Visit via Video Note  I connected with Jo Jackson on 07/28/20 at  9:20 AM EST by a video enabled telemedicine application and verified that I am speaking with the correct person using two identifiers.  Location: Patient: home alone  Provider: home    I discussed the limitations of evaluation and management by telemedicine and the availability of in person appointments. The patient expressed understanding and agreed to proceed.  History of Present Illness: Pt is home c/o sinus problems since Monday and she was exposed to covid.  Yesterday she started with sinus pressure/ headache and sore throat    She has taken claritin D and BC powders      Past Medical History:  Diagnosis Date  . Abnormal Pap smear    Repeats WNL  . ADD (attention deficit disorder)   . Anxiety   . Arthritis    Oseteoarthritis  . Depression   . GERD (gastroesophageal reflux disease)   . Headache(784.0)    Migraines  . Junctional escape rhythm   . Neuromuscular disorder (HCC)    Fibromyalgia  . Pregnancy induced hypertension   . Supraventricular tachycardia [facility])    Outpatient Encounter Medications as of 07/28/2020  Medication Sig  . albuterol (VENTOLIN HFA) 108 (90 Base) MCG/ACT inhaler Inhale 2 puffs into the lungs every 6 (six) hours as needed for wheezing or shortness of breath.  Marland Kitchen amoxicillin-clavulanate (AUGMENTIN) 875-125 MG tablet Take 1 tablet by mouth 2 (two) times daily.  . Diclofenac Sodium (PENNSAID) 2 % SOLN 4 g qid prn  . fluconazole (DIFLUCAN) 150 MG tablet 1 po x1, may repeat in 3 days prn  . HYDROcodone-acetaminophen (NORCO) 10-325 MG tablet Take 1 tablet by mouth every 6 (six) hours as needed.  . INCASSIA 0.35 MG tablet TAKE 1 TABLET BY MOUTH EVERY DAY  . Liraglutide -Weight Management (SAXENDA) 18 MG/3ML SOPN Inject 3 mg into the skin daily.  . mometasone (NASONEX) 50 MCG/ACT nasal spray Place 2 sprays into the nose daily.  . ondansetron (ZOFRAN-ODT) 8 MG disintegrating  tablet TAKE 1 TABLET (8 MG TOTAL) BY MOUTH EVERY 8 (EIGHT) HOURS AS NEEDED FOR NAUSEA OR VOMITING.  . pantoprazole (PROTONIX) 40 MG tablet Take 1 tablet (40 mg total) by mouth daily.  . SUMAtriptan (IMITREX) 50 MG tablet Take 1 tablet by mouth once as needed for headache. May repeat in 2 hours if headache persists or recurs.  . traMADol (ULTRAM) 50 MG tablet Take 1 tablet (50 mg total) by mouth every 8 (eight) hours as needed.  . valACYclovir (VALTREX) 1000 MG tablet TAKE 1 TABLET (1,000 MG TOTAL) BY MOUTH 3 (THREE) TIMES DAILY.  Marland Kitchen vortioxetine HBr (TRINTELLIX) 10 MG TABS tablet Take 3 tablets (30 mg total) by mouth daily.  Marland Kitchen amphetamine-dextroamphetamine (ADDERALL) 30 MG tablet Take 1 tablet by mouth 2 (two) times daily.  Marland Kitchen tiZANidine (ZANAFLEX) 4 MG tablet TAKE 1 TABLET (4 MG TOTAL) BY MOUTH EVERY 6 (SIX) HOURS AS NEEDED FOR MUSCLE SPASMS.   No facility-administered encounter medications on file as of 07/28/2020.   Observations/Objective: There were no vitals filed for this visit. Pt with no fever  Pt is in nad No sob   Assessment and Plan: 1. Acute non-recurrent pansinusitis abx and flonase  covid test --- she has one scheduled for 1/9 but will try [facility] for a test  - amoxicillin-clavulanate (AUGMENTIN) 875-125 MG tablet; Take 1 tablet by mouth 2 (two) times daily.  Dispense: 20 tablet; Refill: 0 - fluconazole (DIFLUCAN) 150 MG  tablet; 1 po x1, may repeat in 3 days prn  Dispense: 2 tablet; Refill: 0   Follow Up Instructions:    I discussed the assessment and treatment plan with the patient. The patient was provided an opportunity to ask questions and all were answered. The patient agreed with the plan and demonstrated an understanding of the instructions.   The patient was advised to call back or seek an in-person evaluation if the symptoms worsen or if the condition fails to improve as anticipated.  I provided 25 minutes of non-face-to-face time during this encounter.   [name], DO

## 2020-07-29 ENCOUNTER — Other Ambulatory Visit: Payer: BC Managed Care – PPO

## 2020-07-29 DIAGNOSIS — Z20822 Contact with and (suspected) exposure to covid-19: Secondary | ICD-10-CM

## 2020-07-30 ENCOUNTER — Telehealth (INDEPENDENT_AMBULATORY_CARE_PROVIDER_SITE_OTHER): Payer: BC Managed Care – PPO | Admitting: Psychiatry

## 2020-07-30 ENCOUNTER — Other Ambulatory Visit: Payer: Self-pay

## 2020-07-30 DIAGNOSIS — F411 Generalized anxiety disorder: Secondary | ICD-10-CM

## 2020-07-30 DIAGNOSIS — F320 Major depressive disorder, single episode, mild: Secondary | ICD-10-CM

## 2020-07-30 DIAGNOSIS — F988 Other specified behavioral and emotional disorders with onset usually occurring in childhood and adolescence: Secondary | ICD-10-CM

## 2020-07-30 DIAGNOSIS — F3342 Major depressive disorder, recurrent, in full remission: Secondary | ICD-10-CM

## 2020-07-30 DIAGNOSIS — F909 Attention-deficit hyperactivity disorder, unspecified type: Secondary | ICD-10-CM | POA: Diagnosis not present

## 2020-07-30 MED ORDER — AMPHETAMINE-DEXTROAMPHETAMINE 30 MG PO TABS: 30.0000 mg | ORAL_TABLET | Freq: Two times a day (BID) | ORAL | 0 refills | Status: DC

## 2020-07-30 MED ORDER — VORTIOXETINE HBR 10 MG PO TABS: 30.0000 mg | ORAL_TABLET | Freq: Every day | ORAL | 0 refills | Status: DC
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
## 2020-07-31 LAB — NOVEL CORONAVIRUS, NAA: SARS-CoV-2, NAA: NOT DETECTED

## 2020-07-31 LAB — SARS-COV-2, NAA 2 DAY TAT

## 2020-08-02 ENCOUNTER — Encounter: Payer: Self-pay | Admitting: Family Medicine

## 2020-08-02 NOTE — Telephone Encounter (Signed)
Did she get her covid test back If not --- can we schedule her with covid clinic?

## 2020-08-02 NOTE — Telephone Encounter (Signed)
I would not take claritin D--- the d is a decongestant that can make bp high --- could be causing headache Since covid neg--  we can bring her into off for ov

## 2020-08-06 NOTE — Telephone Encounter (Signed)
Can do virtual tomorrow instead

## 2020-08-07 ENCOUNTER — Other Ambulatory Visit: Payer: Self-pay

## 2020-08-07 ENCOUNTER — Encounter (HOSPITAL_COMMUNITY): Payer: Self-pay

## 2020-08-07 ENCOUNTER — Ambulatory Visit (HOSPITAL_COMMUNITY)
Admission: EM | Admit: 2020-08-07 | Discharge: 2020-08-07 | Disposition: A | Discharge status: Home / Self Care | Payer: BC Managed Care – PPO | Attending: Emergency Medicine | Admitting: Emergency Medicine

## 2020-08-07 DIAGNOSIS — H9201 Otalgia, right ear: Secondary | ICD-10-CM

## 2020-08-07 DIAGNOSIS — R42 Dizziness and giddiness: Secondary | ICD-10-CM | POA: Diagnosis not present

## 2020-08-07 MED ORDER — MECLIZINE HCL 12.5 MG PO TABS: 12.5000 mg | ORAL_TABLET | Freq: Three times a day (TID) | ORAL | 0 refills | Status: DC | PRN

## 2020-08-07 NOTE — Discharge Instructions (Signed)
Take the meclizine as directed.  Take Tylenol as needed for discomfort.  See the attached information on dizziness and on earaches.  Follow up with your primary care provider on Monday.

## 2020-08-07 NOTE — ED Provider Notes (Signed)
[facility]    CSN: [id number] Arrival date & time: 08/07/20  1103      History   Chief Complaint Chief Complaint  Patient presents with  . Otalgia    Right ear    HPI Jo Jackson is a 50 y.o. female.   Patient presents with dizziness x2 days.  She also reports right ear pain.  She is on day 10 of amoxicillin which her PCP prescribed for a sinus infection.  She denies numbness, weakness, fever, chills, sore throat, chest pain, cough, shortness of breath, vomiting, diarrhea, or other symptoms.  Her medical history includes SVT, arthritis, fibromyalgia, migraines, depression, anxiety, ADD, social anxiety disorder, morbid obesity.  The history is provided by the patient and medical records.    Past Medical History:  Diagnosis Date  . Abnormal Pap smear    Repeats WNL  . ADD (attention deficit disorder)   . Anxiety   . Arthritis    Oseteoarthritis  . Depression   . GERD (gastroesophageal reflux disease)   . Headache(784.0)    Migraines  . Junctional escape rhythm   . Neuromuscular disorder (HCC)    Fibromyalgia  . Pregnancy induced hypertension   . Supraventricular tachycardia [facility])     Patient Active Problem List   Diagnosis Date Noted  . Adult ADHD 06/04/2019  . Social anxiety disorder 06/04/2019  . Influenza 04/15/2018  . Recurrent acute serous otitis media of right ear 03/22/2018  . Vaginal irritation 03/22/2018  . Pansinusitis 10/12/2017  . Viral upper respiratory tract infection 05/28/2017  . Skin tag of perianal region 11/20/2016  . Rectal bleeding 08/20/2015  . Acute bronchitis 06/04/2015  . Sensation of fullness in right ear 04/07/2015  . Cervicogenic headache 11/06/2014  . Obesity (BMI 30-39.9) 06/24/2013  . Morbid obesity with BMI of 40.0-44.9, adult (HCC) 06/07/2013  . SVD (spontaneous vaginal delivery) 12/15/2012    Class: Status post  . HAIR LOSS 08/11/2010  . FATIGUE 08/11/2010  . SINUSITIS - ACUTE-NOS 06/29/2010  . GERD  06/29/2010  . PAIN IN THORACIC SPINE 05/12/2010  . Migraine without aura 02/10/2010  . ELEVATED BLOOD PRESSURE WITHOUT DIAGNOSIS OF HYPERTENSION 02/10/2010  . AMENORRHEA 04/26/2009  . GAD (generalized anxiety disorder) 03/26/2009  . Major depressive disorder, recurrent episode, in full remission (HCC) 03/26/2009  . ALLERGIC REACTION 08/26/2008  . Fibromyalgia 03/05/2007  . ARTHROSCOPY, LEFT KNEE, HX OF 03/05/2007  . Headache 01/31/2007  . HX, PERSONAL, TOBACCO USE 01/31/2007    Past Surgical History:  Procedure Laterality Date  . ABLATION OF DYSRHYTHMIC FOCUS    . APPENDECTOMY  1986  . CARPAL TUNNEL RELEASE  2000  . CERVICAL FUSION  08-24-99,04-26-00,05-09-01  . Knee sugery Left    16 yrs. old  . SHOULDER SURGERY  left-6-04, right 09-12-04  . tongue polyp  1984    OB History    Gravida  3   Para  2   Term  2   Preterm  0   AB  1   Living  2     SAB  1   IAB  0   Ectopic  0   Multiple  0   Live Births  2            Home Medications    Prior to Admission medications   Medication Sig Start Date End Date Taking? Authorizing Provider  albuterol (VENTOLIN HFA) 108 (90 Base) MCG/ACT inhaler Inhale 2 puffs into the lungs every 6 (six) hours as needed for  wheezing or shortness of breath. 07/26/20  Yes Seabron SpatesLowne Chase, Yvonne R, DO  amoxicillin-clavulanate (AUGMENTIN) 875-125 MG tablet Take 1 tablet by mouth 2 (two) times daily. 07/28/20  Yes Seabron SpatesLowne Chase, Yvonne R, DO  amphetamine-dextroamphetamine (ADDERALL) 30 MG tablet Take 1 tablet by mouth 2 (two) times daily. 08/23/20 11/21/20 Yes Pucilowski, Roosvelt Maserlgierd A, MD  fluconazole (DIFLUCAN) 150 MG tablet 1 po x1, may repeat in 3 days prn 07/28/20  Yes Lowne Irish Eldershase, Yvonne R, DO  HYDROcodone-acetaminophen (NORCO) 10-325 MG tablet Take 1 tablet by mouth every 6 (six) hours as needed. 05/28/20  Yes Zola ButtonLowne Chase, Yvonne R, DO  INCASSIA 0.35 MG tablet TAKE 1 TABLET BY MOUTH EVERY DAY 04/05/20  Yes Donato SchultzLowne Chase, Yvonne R, DO  meclizine  (ANTIVERT) 12.5 MG tablet Take 1 tablet (12.5 mg total) by mouth 3 (three) times daily as needed for dizziness. 08/07/20  Yes Mickie Bailate, Hser Belanger H, NP  mometasone (NASONEX) 50 MCG/ACT nasal spray Place 2 sprays into the nose daily. 10/29/19  Yes Seabron SpatesLowne Chase, Yvonne R, DO  ondansetron (ZOFRAN-ODT) 8 MG disintegrating tablet TAKE 1 TABLET (8 MG TOTAL) BY MOUTH EVERY 8 (EIGHT) HOURS AS NEEDED FOR NAUSEA OR VOMITING. 04/27/20  Yes Zola ButtonLowne Chase, Yvonne R, DO  pantoprazole (PROTONIX) 40 MG tablet Take 1 tablet (40 mg total) by mouth daily. 10/29/19  Yes Seabron SpatesLowne Chase, Yvonne R, DO  SUMAtriptan (IMITREX) 50 MG tablet Take 1 tablet by mouth once as needed for headache. May repeat in 2 hours if headache persists or recurs. 05/28/20  Yes Seabron SpatesLowne Chase, Yvonne R, DO  tiZANidine (ZANAFLEX) 4 MG tablet TAKE 1 TABLET (4 MG TOTAL) BY MOUTH EVERY 6 (SIX) HOURS AS NEEDED FOR MUSCLE SPASMS. 08/22/19  Yes Donato SchultzLowne Chase, Yvonne R, DO  traMADol (ULTRAM) 50 MG tablet Take 1 tablet (50 mg total) by mouth every 8 (eight) hours as needed. 05/28/20  Yes Seabron SpatesLowne Chase, Yvonne R, DO  valACYclovir (VALTREX) 1000 MG tablet TAKE 1 TABLET (1,000 MG TOTAL) BY MOUTH 3 (THREE) TIMES DAILY. 03/30/20  Yes Donato SchultzLowne Chase, Yvonne R, DO  vortioxetine HBr (TRINTELLIX) 10 MG TABS tablet Take 3 tablets (30 mg total) by mouth daily. 07/30/20  Yes Pucilowski, Olgierd A, MD  Diclofenac Sodium (PENNSAID) 2 % SOLN 4 g qid prn 07/27/17   Zola ButtonLowne Chase, Grayling CongressYvonne R, DO  Liraglutide -Weight Management (SAXENDA) 18 MG/3ML SOPN Inject 3 mg into the skin daily. 04/12/20   Donato SchultzLowne Chase, Yvonne R, DO    Family History Family History  Problem Relation Age of Onset  . Hodgkin's lymphoma Father   . COPD Mother   . Heart disease Mother        chf  . Depression Mother   . Cancer Paternal Aunt        breast  . Diabetes Other   . Thyroid disease Other   . Hypertension Other   . Depression Other   . Bipolar disorder Other   . ADD / ADHD Brother     Social History Social History    Tobacco Use  . Smoking status: Former Smoker    Quit date: 10/23/2007    Years since quitting: 12.8  . Smokeless tobacco: Never Used  Vaping Use  . Vaping Use: Never used  Substance Use Topics  . Alcohol use: Yes    Alcohol/week: 0.0 standard drinks    Comment: Occ  . Drug use: No     Allergies   Ketorolac tromethamine, Morphine, and Latex   Review of Systems Review of Systems  Constitutional: Negative for chills and fever.  HENT: Positive for ear pain. Negative for sore throat.   Eyes: Negative for pain and visual disturbance.  Respiratory: Negative for cough and shortness of breath.   Cardiovascular: Negative for chest pain and palpitations.  Gastrointestinal: Negative for abdominal pain and vomiting.  Genitourinary: Negative for dysuria and hematuria.  Musculoskeletal: Negative for arthralgias and back pain.  Skin: Negative for color change and rash.  Neurological: Positive for dizziness. Negative for seizures, syncope, weakness and numbness.  All other systems reviewed and are negative.    Physical Exam Triage Vital Signs ED Triage Vitals  Enc Vitals Group     BP      Pulse      Resp      Temp      Temp src      SpO2      Weight      Height      Head Circumference      Peak Flow      Pain Score      Pain Loc      Pain Edu?      Excl. in GC?    No data found.  Updated Vital Signs BP 127/83 (BP Location: Right Arm)   Pulse 91   Temp 98 F (36.7 C) (Oral)   Resp 18   LMP 06/07/2020   SpO2 99%   Visual Acuity Right Eye Distance:   Left Eye Distance:   Bilateral Distance:    Right Eye Near:   Left Eye Near:    Bilateral Near:     Physical Exam Vitals and nursing note reviewed.  Constitutional:      General: She is not in acute distress.    Appearance: She is well-developed and well-nourished. She is obese. She is not ill-appearing.  HENT:     Head: Normocephalic and atraumatic.     Right Ear: Tympanic membrane and ear canal normal.      Left Ear: Tympanic membrane and ear canal normal.     Nose: Nose normal.     Mouth/Throat:     Mouth: Mucous membranes are moist.     Pharynx: Oropharynx is clear.  Eyes:     Conjunctiva/sclera: Conjunctivae normal.  Cardiovascular:     Rate and Rhythm: Normal rate and regular rhythm.     Heart sounds: Normal heart sounds.  Pulmonary:     Effort: Pulmonary effort is normal. No respiratory distress.     Breath sounds: Normal breath sounds.  Abdominal:     Palpations: Abdomen is soft.     Tenderness: There is no abdominal tenderness.  Musculoskeletal:        General: No edema.     Cervical back: Neck supple.  Skin:    General: Skin is warm and dry.  Neurological:     General: No focal deficit present.     Mental Status: She is alert and oriented to person, place, and time.     Sensory: No sensory deficit.     Motor: No weakness.     Gait: Gait normal.  Psychiatric:        Mood and Affect: Mood and affect and mood normal.        Behavior: Behavior normal.      UC Treatments / Results  Labs (all labs ordered are listed, but only abnormal results are displayed) Labs Reviewed - No data to display  EKG   Radiology No results found.  Procedures Procedures (  including critical care time)  Medications Ordered in UC Medications - No data to display  Initial Impression / Assessment and Plan / UC Course  I have reviewed the triage vital signs and the nursing notes.  Pertinent labs & imaging results that were available during my care of the patient were reviewed by me and considered in my medical decision making (see chart for details).   Dizziness.  Right otalgia.  Treating with meclizine as needed for dizziness and Tylenol as needed for discomfort.  Education provided on dizziness and earaches.  Patient has an appointment already scheduled to see her PCP on Monday.  She agrees to plan of care.   Final Clinical Impressions(s) / UC Diagnoses   Final diagnoses:   Dizziness  Otalgia of right ear     Discharge Instructions     Take the meclizine as directed.  Take Tylenol as needed for discomfort.  See the attached information on dizziness and on earaches.  Follow up with your primary care provider on Monday.        ED Prescriptions    Medication Sig Dispense Auth. Provider   meclizine (ANTIVERT) 12.5 MG tablet Take 1 tablet (12.5 mg total) by mouth 3 (three) times daily as needed for dizziness. 10 tablet Mickie Bail, NP     PDMP not reviewed this encounter.   Mickie Bail, NP 08/07/20 1304

## 2020-08-07 NOTE — ED Triage Notes (Signed)
Patient states she has had a sinus infection and been on amoxicillin for 10 days. Pt states she has had a flare up and is becoming extremely dizzy. Pt states she also has right ear pain. Pt is aox4 and ambulatory.

## 2020-08-09 ENCOUNTER — Encounter: Payer: Self-pay | Admitting: Family Medicine

## 2020-08-09 ENCOUNTER — Telehealth (INDEPENDENT_AMBULATORY_CARE_PROVIDER_SITE_OTHER): Payer: BC Managed Care – PPO | Admitting: Family Medicine

## 2020-08-09 VITALS — BP 134/71 | HR 89

## 2020-08-09 DIAGNOSIS — R42 Dizziness and giddiness: Secondary | ICD-10-CM

## 2020-08-09 MED ORDER — PREDNISONE 10 MG PO TABS: ORAL_TABLET | ORAL | 0 refills | Status: DC

## 2020-08-09 MED ORDER — MECLIZINE HCL 25 MG PO TABS: 25.0000 mg | ORAL_TABLET | Freq: Three times a day (TID) | ORAL | 0 refills | Status: DC | PRN

## 2020-08-09 NOTE — Telephone Encounter (Signed)
Pt seen at urgent care on 08/07/20,

## 2020-08-09 NOTE — Progress Notes (Signed)
Virtual Visit via Video Note  I connected with Jo Jackson on 08/09/20 at 10:20 AM EST by a video enabled telemedicine application and verified that I am speaking with the correct person using two identifiers.  Location: Patient: home with daughters Provider: home    I discussed the limitations of evaluation and management by telemedicine and the availability of in person appointments. The patient expressed understanding and agreed to proceed.  History of Present Illness: Pt had sudden onset of dizziness around 08/04/20 -- she went to UC and was given meclizine  She is taking the augmentin for the sinus infection -- she just finished it  Pt states sinus congestion is gone she just has the dizziness that puts her on her back uc examined her ears etc and she had no infection    Observations/Objective: Vitals:   08/09/20 0957  BP: 134/71  Pulse: 89   Pt is in bed due to dizziness  Pt is in nad   Assessment and Plan: 1. Dizziness She can increase meclizine 25 mg  pred taper  F/u in office if no better  - predniSONE (DELTASONE) 10 MG tablet; TAKE 3 TABLETS PO QD FOR 3 DAYS THEN TAKE 2 TABLETS PO QD FOR 3 DAYS THEN TAKE 1 TABLET PO QD FOR 3 DAYS THEN TAKE 1/2 TAB PO QD FOR 3 DAYS  Dispense: 20 tablet; Refill: 0 - meclizine (ANTIVERT) 25 MG tablet; Take 1 tablet (25 mg total) by mouth 3 (three) times daily as needed for dizziness.  Dispense: 30 tablet; Refill: 0   Follow Up Instructions:    I discussed the assessment and treatment plan with the patient. The patient was provided an opportunity to ask questions and all were answered. The patient agreed with the plan and demonstrated an understanding of the instructions.   The patient was advised to call back or seek an in-person evaluation if the symptoms worsen or if the condition fails to improve as anticipated.  I provided 25 minutes of non-face-to-face time during this encounter.   [name], DO

## 2020-08-11 ENCOUNTER — Other Ambulatory Visit: Payer: Self-pay

## 2020-08-11 ENCOUNTER — Encounter: Payer: Self-pay | Admitting: Family Medicine

## 2020-08-11 MED ORDER — LEVOFLOXACIN 500 MG PO TABS: 500.0000 mg | ORAL_TABLET | Freq: Every day | ORAL | 0 refills | Status: DC

## 2020-08-11 NOTE — Telephone Encounter (Signed)
Can send in levaquin 500 mg 1 po qd x 7 days Her covid test was neg -- she can be seen in office if needed

## 2020-08-15 ENCOUNTER — Other Ambulatory Visit: Payer: Self-pay | Admitting: Family Medicine

## 2020-08-15 DIAGNOSIS — J111 Influenza due to unidentified influenza virus with other respiratory manifestations: Secondary | ICD-10-CM

## 2020-08-16 ENCOUNTER — Other Ambulatory Visit: Payer: Self-pay

## 2020-08-17 ENCOUNTER — Ambulatory Visit: Payer: BC Managed Care – PPO | Admitting: Family Medicine

## 2020-08-17 ENCOUNTER — Other Ambulatory Visit: Payer: Self-pay

## 2020-08-17 ENCOUNTER — Encounter: Payer: Self-pay | Admitting: Family Medicine

## 2020-08-17 VITALS — BP 120/84 | HR 94 | Temp 98.2°F | Resp 18 | Ht 67.0 in | Wt 290.6 lb

## 2020-08-17 DIAGNOSIS — J324 Chronic pansinusitis: Secondary | ICD-10-CM | POA: Diagnosis not present

## 2020-08-17 DIAGNOSIS — R42 Dizziness and giddiness: Secondary | ICD-10-CM

## 2020-08-17 MED ORDER — LEVOFLOXACIN 500 MG PO TABS: 500.0000 mg | ORAL_TABLET | Freq: Every day | ORAL | 0 refills | Status: DC

## 2020-08-17 NOTE — Patient Instructions (Signed)
How to Perform the Epley Maneuver The Epley maneuver is an exercise that relieves symptoms of vertigo. Vertigo is the feeling that you or your surroundings are moving when they are not. When you feel vertigo, you may feel like the room is spinning and may have trouble walking. The Epley maneuver is used for a type of vertigo caused by a calcium deposit in a part of the inner ear. The maneuver involves changing head positions to help the deposit move out of the area. You can do this maneuver at home whenever you have symptoms of vertigo. You can repeat it in 24 hours if your vertigo has not gone away. Even though the Epley maneuver may relieve your vertigo for a few weeks, it is possible that your symptoms will return. This maneuver relieves vertigo, but it does not relieve dizziness. What are the risks? If it is done correctly, the Epley maneuver is considered safe. Sometimes it can lead to dizziness or nausea that goes away after a short time. If you develop other symptoms--such as changes in vision, weakness, or numbness--stop doing the maneuver and call your health care provider. Supplies needed:  A bed or table.  A pillow. How to do the Epley maneuver 1. Sit on the edge of a bed or table with your back straight and your legs extended or hanging over the edge of the bed or table. 2. Turn your head halfway toward the affected ear or side as told by your health care provider. 3. Lie backward quickly with your head turned until you are lying flat on your back. You may want to position a pillow under your shoulders. 4. Hold this position for at least 30 seconds. If you feel dizzy or have symptoms of vertigo, continue to hold the position until the symptoms stop. 5. Turn your head to the opposite direction until your unaffected ear is facing the floor. 6. Hold this position for at least 30 seconds. If you feel dizzy or have symptoms of vertigo, continue to hold the position until the symptoms  stop. 7. Turn your whole body to the same side as your head so that you are positioned on your side. Your head will now be nearly facedown. Hold for at least 30 seconds. If you feel dizzy or have symptoms of vertigo, continue to hold the position until the symptoms stop. 8. Sit back up. You can repeat the maneuver in 24 hours if your vertigo does not go away.      Follow these instructions at home: For 24 hours after doing the Epley maneuver:  Keep your head in an upright position.  When lying down to sleep or rest, keep your head raised (elevated) with two or more pillows.  Avoid excessive neck movements. Activity  Do not drive or use machinery if you feel dizzy.  After doing the Epley maneuver, return to your normal activities as told by your health care provider. Ask your health care provider what activities are safe for you. General instructions  Drink enough fluid to keep your urine pale yellow.  Do not drink alcohol.  Take over-the-counter and prescription medicines only as told by your health care provider.  Keep all follow-up visits as told by your health care provider. This is important. Preventing vertigo symptoms Ask your health care provider if there is anything you should do at home to prevent vertigo. He or she may recommend that you:  Keep your head elevated with two or more pillows while you sleep.    Do not sleep on the side of your affected ear.  Get up slowly from bed.  Avoid sudden movements during the day.  Avoid extreme head positions or movement, such as looking up or bending over. Contact a health care provider if:  Your vertigo gets worse.  You have other symptoms, including: ? Nausea. ? Vomiting. ? Headache. Get help right away if you:  Have vision changes.  Have a headache or neck pain that is severe or getting worse.  Cannot stop vomiting.  Have new numbness or weakness in any part of your body. Summary  Vertigo is the feeling that  you or your surroundings are moving when they are not.  The Epley maneuver is an exercise that relieves symptoms of vertigo.  If the Epley maneuver is done correctly, it is considered safe and relieves vertigo quickly. This information is not intended to replace advice given to you by your health care provider. Make sure you discuss any questions you have with your health care provider. Document Revised: 05/07/2019 Document Reviewed: 05/07/2019 Elsevier Patient Education  2021 [employer]  

## 2020-08-17 NOTE — Progress Notes (Signed)
Patient ID: Jo Jackson, female    DOB: 1971-03-29  Age: 50 y.o. MRN: [id number]    Subjective:  Subjective  HPI [name] presents for f/u sinusitis and dizziness    Pt still has thick mucus and dizziness   Pt has not been to work in a month No fevers  Review of Systems  Constitutional: Negative for appetite change, diaphoresis, fatigue and unexpected weight change.  HENT: Positive for congestion, sinus pressure and sinus pain. Negative for postnasal drip.   Eyes: Negative for pain, redness and visual disturbance.  Respiratory: Negative for cough, chest tightness, shortness of breath and wheezing.   Cardiovascular: Negative for chest pain, palpitations and leg swelling.  Endocrine: Negative for cold intolerance, heat intolerance, polydipsia, polyphagia and polyuria.  Genitourinary: Negative for difficulty urinating, dysuria and frequency.  Neurological: Positive for dizziness. Negative for light-headedness, numbness and headaches.    History Past Medical History:  Diagnosis Date  . Abnormal Pap smear    Repeats WNL  . ADD (attention deficit disorder)   . Anxiety   . Arthritis    Oseteoarthritis  . Depression   . GERD (gastroesophageal reflux disease)   . Headache(784.0)    Migraines  . Junctional escape rhythm   . Neuromuscular disorder (HCC)    Fibromyalgia  . Pregnancy induced hypertension   . Supraventricular tachycardia (HCC)     She has a past surgical history that includes Appendectomy (1986); Carpal tunnel release (2000); Cervical fusion (08-24-99,04-26-00,05-09-01); Shoulder surgery (left-6-04, right 09-12-04); tongue polyp (1984); Ablation of dysrhythmic focus; and Knee sugery (Left).   Her family history includes ADD / ADHD in her brother; Bipolar disorder in an other family member; COPD in her mother; Cancer in her paternal aunt; Depression in her mother and another family member; Diabetes in an other family member; Heart disease in her mother;  Hodgkin's lymphoma in her father; Hypertension in an other family member; Thyroid disease in an other family member.She reports that she quit smoking about 12 years ago. She has never used smokeless tobacco. She reports current alcohol use. She reports that she does not use drugs.  Current Outpatient Medications on File Prior to Visit  Medication Sig Dispense Refill  . albuterol (VENTOLIN HFA) 108 (90 Base) MCG/ACT inhaler Inhale 2 puffs into the lungs every 6 (six) hours as needed for wheezing or shortness of breath. 18 g 5  . [START ON 08/23/2020] amphetamine-dextroamphetamine (ADDERALL) 30 MG tablet Take 1 tablet by mouth 2 (two) times daily. 180 tablet 0  . HYDROcodone-acetaminophen (NORCO) 10-325 MG tablet Take 1 tablet by mouth every 6 (six) hours as needed. 30 tablet 0  . INCASSIA 0.35 MG tablet TAKE 1 TABLET BY MOUTH EVERY DAY 84 tablet 1  . levofloxacin (LEVAQUIN) 500 MG tablet Take 1 tablet (500 mg total) by mouth daily. 7 tablet 0  . meclizine (ANTIVERT) 25 MG tablet Take 1 tablet (25 mg total) by mouth 3 (three) times daily as needed for dizziness. 30 tablet 0  . mometasone (NASONEX) 50 MCG/ACT nasal spray Place 2 sprays into the nose daily. 17 g 12  . ondansetron (ZOFRAN-ODT) 8 MG disintegrating tablet TAKE 1 TABLET (8 MG TOTAL) BY MOUTH EVERY 8 (EIGHT) HOURS AS NEEDED FOR NAUSEA OR VOMITING. 20 tablet 1  . pantoprazole (PROTONIX) 40 MG tablet Take 1 tablet (40 mg total) by mouth daily. 90 tablet 3  . predniSONE (DELTASONE) 10 MG tablet TAKE 3 TABLETS PO QD FOR 3 DAYS THEN TAKE 2 TABLETS PO QD FOR  3 DAYS THEN TAKE 1 TABLET PO QD FOR 3 DAYS THEN TAKE 1/2 TAB PO QD FOR 3 DAYS 20 tablet 0  . tiZANidine (ZANAFLEX) 4 MG tablet TAKE 1 TABLET (4 MG TOTAL) BY MOUTH EVERY 6 (SIX) HOURS AS NEEDED FOR MUSCLE SPASMS. 90 tablet 1  . traMADol (ULTRAM) 50 MG tablet Take 1 tablet (50 mg total) by mouth every 8 (eight) hours as needed. 30 tablet 0   No current facility-administered medications on file  prior to visit.     Objective:  Objective  Physical Exam Vitals and nursing note reviewed.  Constitutional:      Appearance: She is well-nourished. She is not diaphoretic.  HENT:     Head: Normocephalic.     Right Ear: Tympanic membrane, ear canal and external ear normal.     Left Ear: Tympanic membrane, ear canal and external ear normal.     Nose: Sinus tenderness, mucosal edema and rhinorrhea present. No nasal deformity.     Right Sinus: Maxillary sinus tenderness and frontal sinus tenderness present.     Left Sinus: Maxillary sinus tenderness and frontal sinus tenderness present.     Mouth/Throat:     Mouth: Oropharynx is clear and moist and mucous membranes are normal.     Pharynx: No oropharyngeal exudate.  Cardiovascular:     Rate and Rhythm: Normal rate and regular rhythm.     Heart sounds: Normal heart sounds. No murmur heard.   Pulmonary:     Effort: Pulmonary effort is normal. No respiratory distress.     Breath sounds: Normal breath sounds. No decreased breath sounds, wheezing, rhonchi or rales.  Musculoskeletal:     Cervical back: Normal range of motion and neck supple.  Lymphadenopathy:     Cervical: No cervical adenopathy.  Skin:    General: Skin is warm.  Neurological:     Mental Status: She is alert and oriented to person, place, and time.  Psychiatric:        Mood and Affect: Mood and affect normal.    BP 120/84 (BP Location: Right Arm, Patient Position: Sitting, Cuff Size: Large)   Pulse 94   Temp 98.2 F (36.8 C) (Oral)   Resp 18   Ht 5\' 7"  (1.702 m)   Wt 290 lb 9.6 oz (131.8 kg)   SpO2 99%   BMI 45.51 kg/m  Wt Readings from Last 3 Encounters:  08/17/20 290 lb 9.6 oz (131.8 kg)  12/15/19 277 lb 3.2 oz (125.7 kg)  09/16/19 281 lb 12.8 oz (127.8 kg)     Lab Results  Component Value Date   WBC 5.6 02/25/2019   HGB 13.7 02/25/2019   HCT 39.9 02/25/2019   PLT 231.0 02/25/2019   GLUCOSE 89 02/25/2019   CHOL 154 02/25/2019   TRIG 50.0  02/25/2019   HDL 63.20 02/25/2019   LDLCALC 81 02/25/2019   ALT 16 02/25/2019   AST 15 02/25/2019   NA 138 02/25/2019   K 3.7 02/25/2019   CL 102 02/25/2019   CREATININE 0.75 02/25/2019   BUN 14 02/25/2019   CO2 28 02/25/2019   TSH 2.91 02/25/2019   HGBA1C 5.4 06/06/2013    No results found.   Assessment & Plan:  Plan  I have discontinued Kathren Vanmetre's Diclofenac Sodium and fluconazole. I am also having her maintain her tiZANidine, pantoprazole, mometasone, Incassia, ondansetron, traMADol, HYDROcodone-acetaminophen, amphetamine-dextroamphetamine, predniSONE, meclizine, levofloxacin, and albuterol.  Meds ordered this encounter  Medications  . DISCONTD: levofloxacin (LEVAQUIN) 500 MG tablet  Sig: Take 1 tablet (500 mg total) by mouth daily.    Dispense:  7 tablet    Refill:  0    Problem List Items Addressed This Visit      Unprioritized   Dizzy    Ct pending May be due to congestion  Referral to ent pending       Relevant Orders   Ambulatory referral to ENT   Pansinusitis - Primary    Finish levaquin,  con't flonase Ct sinuses  Refer to ent       Relevant Orders   CT MAXILLOFACIAL LTD WO CM   Ambulatory referral to ENT      Follow-up: Return if symptoms worsen or fail to improve.  [name], DO

## 2020-08-18 DIAGNOSIS — R42 Dizziness and giddiness: Secondary | ICD-10-CM | POA: Insufficient documentation

## 2020-08-18 NOTE — Assessment & Plan Note (Signed)
Finish levaquin,  con't flonase Ct sinuses  Refer to ent

## 2020-08-18 NOTE — Assessment & Plan Note (Signed)
Ct pending May be due to congestion  Referral to ent pending

## 2020-08-18 NOTE — Telephone Encounter (Signed)
She will need to come in with disability forms and we will fill them out in visit

## 2020-08-19 ENCOUNTER — Encounter: Payer: Self-pay | Admitting: Family Medicine

## 2020-08-23 ENCOUNTER — Ambulatory Visit (HOSPITAL_BASED_OUTPATIENT_CLINIC_OR_DEPARTMENT_OTHER)
Admission: RE | Admit: 2020-08-23 | Discharge: 2020-08-23 | Disposition: A | Discharge status: Home / Self Care | Payer: BC Managed Care – PPO | Source: Ambulatory Visit | Attending: Family Medicine | Admitting: Family Medicine

## 2020-08-23 ENCOUNTER — Other Ambulatory Visit: Payer: Self-pay

## 2020-08-23 DIAGNOSIS — J324 Chronic pansinusitis: Secondary | ICD-10-CM

## 2020-08-23 NOTE — Telephone Encounter (Signed)
The ears are connected to the sinuses---- so it was important to see

## 2020-09-02 ENCOUNTER — Other Ambulatory Visit: Payer: Self-pay | Admitting: Family Medicine

## 2020-09-02 DIAGNOSIS — K219 Gastro-esophageal reflux disease without esophagitis: Secondary | ICD-10-CM

## 2020-09-02 DIAGNOSIS — M797 Fibromyalgia: Secondary | ICD-10-CM

## 2020-09-02 DIAGNOSIS — G43809 Other migraine, not intractable, without status migrainosus: Secondary | ICD-10-CM

## 2020-09-03 MED ORDER — HYDROCODONE-ACETAMINOPHEN 10-325 MG PO TABS: 1.0000 | ORAL_TABLET | Freq: Four times a day (QID) | ORAL | 0 refills | Status: DC | PRN

## 2020-09-03 MED ORDER — TRAMADOL HCL 50 MG PO TABS: 50.0000 mg | ORAL_TABLET | Freq: Three times a day (TID) | ORAL | 0 refills | Status: DC | PRN

## 2020-09-03 NOTE — Telephone Encounter (Signed)
Requesting: Tramadol  Contract: 09/06/2019 UDS: 02/25/2019  Last Visit: 08/17/20- acute Next Visit: 09/06/20 Last Refill: 05/28/2020   Please Advise  Requesting: Awanda Mink  Contract: 09/06/2019 UDS: 02/25/2019  Last Visit: 08/17/20- acute Next Visit: 09/06/20 Last Refill: 05/28/2020  Please Advise

## 2020-09-06 ENCOUNTER — Ambulatory Visit (INDEPENDENT_AMBULATORY_CARE_PROVIDER_SITE_OTHER): Payer: BC Managed Care – PPO | Admitting: Family Medicine

## 2020-09-06 ENCOUNTER — Encounter: Payer: Self-pay | Admitting: Family Medicine

## 2020-09-06 ENCOUNTER — Other Ambulatory Visit: Payer: Self-pay

## 2020-09-06 DIAGNOSIS — F3342 Major depressive disorder, recurrent, in full remission: Secondary | ICD-10-CM

## 2020-09-06 DIAGNOSIS — R42 Dizziness and giddiness: Secondary | ICD-10-CM | POA: Diagnosis not present

## 2020-09-06 DIAGNOSIS — R300 Dysuria: Secondary | ICD-10-CM | POA: Diagnosis not present

## 2020-09-06 DIAGNOSIS — F330 Major depressive disorder, recurrent, mild: Secondary | ICD-10-CM | POA: Diagnosis not present

## 2020-09-06 DIAGNOSIS — Z9483 Pancreas transplant status: Secondary | ICD-10-CM | POA: Insufficient documentation

## 2020-09-06 NOTE — Assessment & Plan Note (Signed)
Pt pending  fmla paperwork filled out

## 2020-09-06 NOTE — Patient Instructions (Signed)
How to Perform the Epley Maneuver The Epley maneuver is an exercise that relieves symptoms of vertigo. Vertigo is the feeling that you or your surroundings are moving when they are not. When you feel vertigo, you may feel like the room is spinning and may have trouble walking. The Epley maneuver is used for a type of vertigo caused by a calcium deposit in a part of the inner ear. The maneuver involves changing head positions to help the deposit move out of the area. You can do this maneuver at home whenever you have symptoms of vertigo. You can repeat it in 24 hours if your vertigo has not gone away. Even though the Epley maneuver may relieve your vertigo for a few weeks, it is possible that your symptoms will return. This maneuver relieves vertigo, but it does not relieve dizziness. What are the risks? If it is done correctly, the Epley maneuver is considered safe. Sometimes it can lead to dizziness or nausea that goes away after a short time. If you develop other symptoms--such as changes in vision, weakness, or numbness--stop doing the maneuver and call your health care provider. Supplies needed:  A bed or table.  A pillow. How to do the Epley maneuver 1. Sit on the edge of a bed or table with your back straight and your legs extended or hanging over the edge of the bed or table. 2. Turn your head halfway toward the affected ear or side as told by your health care provider. 3. Lie backward quickly with your head turned until you are lying flat on your back. You may want to position a pillow under your shoulders. 4. Hold this position for at least 30 seconds. If you feel dizzy or have symptoms of vertigo, continue to hold the position until the symptoms stop. 5. Turn your head to the opposite direction until your unaffected ear is facing the floor. 6. Hold this position for at least 30 seconds. If you feel dizzy or have symptoms of vertigo, continue to hold the position until the symptoms  stop. 7. Turn your whole body to the same side as your head so that you are positioned on your side. Your head will now be nearly facedown. Hold for at least 30 seconds. If you feel dizzy or have symptoms of vertigo, continue to hold the position until the symptoms stop. 8. Sit back up. You can repeat the maneuver in 24 hours if your vertigo does not go away.      Follow these instructions at home: For 24 hours after doing the Epley maneuver:  Keep your head in an upright position.  When lying down to sleep or rest, keep your head raised (elevated) with two or more pillows.  Avoid excessive neck movements. Activity  Do not drive or use machinery if you feel dizzy.  After doing the Epley maneuver, return to your normal activities as told by your health care provider. Ask your health care provider what activities are safe for you. General instructions  Drink enough fluid to keep your urine pale yellow.  Do not drink alcohol.  Take over-the-counter and prescription medicines only as told by your health care provider.  Keep all follow-up visits as told by your health care provider. This is important. Preventing vertigo symptoms Ask your health care provider if there is anything you should do at home to prevent vertigo. He or she may recommend that you:  Keep your head elevated with two or more pillows while you sleep.    Do not sleep on the side of your affected ear.  Get up slowly from bed.  Avoid sudden movements during the day.  Avoid extreme head positions or movement, such as looking up or bending over. Contact a health care provider if:  Your vertigo gets worse.  You have other symptoms, including: ? Nausea. ? Vomiting. ? Headache. Get help right away if you:  Have vision changes.  Have a headache or neck pain that is severe or getting worse.  Cannot stop vomiting.  Have new numbness or weakness in any part of your body. Summary  Vertigo is the feeling that  you or your surroundings are moving when they are not.  The Epley maneuver is an exercise that relieves symptoms of vertigo.  If the Epley maneuver is done correctly, it is considered safe and relieves vertigo quickly. This information is not intended to replace advice given to you by your health care provider. Make sure you discuss any questions you have with your health care provider. Document Revised: 05/07/2019 Document Reviewed: 05/07/2019 Elsevier Patient Education  2021 [employer]  

## 2020-09-06 NOTE — Progress Notes (Signed)
Patient ID: Jo Jackson, female    DOB: [birth year]  Age: 50 y.o. MRN: [id number]    Subjective:  Subjective  HPI Jo Jackson presents for f/u vertigo.  She saw ent and audiology and was dx with hearing loss.  She is also being sent to PT for vertigo.    Pt needs fmla paperwork filled out  No other complaints   Review of Systems  Constitutional: Negative for appetite change, diaphoresis, fatigue and unexpected weight change.  Eyes: Negative for pain, redness and visual disturbance.  Respiratory: Negative for cough, chest tightness, shortness of breath and wheezing.   Cardiovascular: Negative for chest pain, palpitations and leg swelling.  Endocrine: Negative for cold intolerance, heat intolerance, polydipsia, polyphagia and polyuria.  Genitourinary: Negative for difficulty urinating, dysuria and frequency.  Neurological: Positive for dizziness. Negative for light-headedness, numbness and headaches.    History Past Medical History:  Diagnosis Date  . Abnormal Pap smear    Repeats WNL  . ADD (attention deficit disorder)   . Anxiety   . Arthritis    Oseteoarthritis  . Depression   . GERD (gastroesophageal reflux disease)   . Headache(784.0)    Migraines  . Junctional escape rhythm   . Neuromuscular disorder (HCC)    Fibromyalgia  . Pregnancy induced hypertension   . Supraventricular tachycardia (HCC)     She has a past surgical history that includes Appendectomy (1986); Carpal tunnel release (2000); Cervical fusion (08-24-99,04-26-00,05-09-01); Shoulder surgery (left-6-04, right 09-12-04); tongue polyp (1984); Ablation of dysrhythmic focus; and Knee sugery (Left).   Her family history includes ADD / ADHD in her brother; Bipolar disorder in an other family member; COPD in her mother; Cancer in her paternal aunt; Depression in her mother and another family member; Diabetes in an other family member; Heart disease in her mother; Hodgkin's lymphoma in her father;  Hypertension in an other family member; Thyroid disease in an other family member.She reports that she quit smoking about 12 years ago. She has never used smokeless tobacco. She reports current alcohol use. She reports that she does not use drugs.  Current Outpatient Medications on File Prior to Visit  Medication Sig Dispense Refill  . albuterol (VENTOLIN HFA) 108 (90 Base) MCG/ACT inhaler Inhale 2 puffs into the lungs every 6 (six) hours as needed for wheezing or shortness of breath. 18 g 5  . amphetamine-dextroamphetamine (ADDERALL) 30 MG tablet Take 1 tablet by mouth 2 (two) times daily. 180 tablet 0  . HYDROcodone-acetaminophen (NORCO) 10-325 MG tablet Take 1 tablet by mouth every 6 (six) hours as needed. 30 tablet 0  . INCASSIA 0.35 MG tablet TAKE 1 TABLET BY MOUTH EVERY DAY 84 tablet 1  . levofloxacin (LEVAQUIN) 500 MG tablet Take 1 tablet (500 mg total) by mouth daily. 7 tablet 0  . meclizine (ANTIVERT) 25 MG tablet Take 1 tablet (25 mg total) by mouth 3 (three) times daily as needed for dizziness. 30 tablet 0  . mometasone (NASONEX) 50 MCG/ACT nasal spray Place 2 sprays into the nose daily. 17 g 12  . ondansetron (ZOFRAN-ODT) 8 MG disintegrating tablet TAKE 1 TABLET (8 MG TOTAL) BY MOUTH EVERY 8 (EIGHT) HOURS AS NEEDED FOR NAUSEA OR VOMITING. 20 tablet 1  . pantoprazole (PROTONIX) 40 MG tablet TAKE 1 TABLET BY MOUTH EVERY DAY 90 tablet 3  . predniSONE (DELTASONE) 10 MG tablet TAKE 3 TABLETS PO QD FOR 3 DAYS THEN TAKE 2 TABLETS PO QD FOR 3 DAYS THEN TAKE 1 TABLET PO QD FOR  3 DAYS THEN TAKE 1/2 TAB PO QD FOR 3 DAYS 20 tablet 0  . tiZANidine (ZANAFLEX) 4 MG tablet TAKE 1 TABLET (4 MG TOTAL) BY MOUTH EVERY 6 (SIX) HOURS AS NEEDED FOR MUSCLE SPASMS. 90 tablet 1  . traMADol (ULTRAM) 50 MG tablet Take 1 tablet (50 mg total) by mouth every 8 (eight) hours as needed. 30 tablet 0   No current facility-administered medications on file prior to visit.     Objective:  Objective  Physical  Exam Vitals and nursing note reviewed.  Constitutional:      Appearance: She is well-developed and well-nourished.  HENT:     Head: Normocephalic and atraumatic.  Eyes:     Extraocular Movements: EOM normal.     Conjunctiva/sclera: Conjunctivae normal.  Neck:     Thyroid: No thyromegaly.     Vascular: No carotid bruit or JVD.  Cardiovascular:     Rate and Rhythm: Normal rate and regular rhythm.     Heart sounds: Normal heart sounds. No murmur heard.   Pulmonary:     Effort: Pulmonary effort is normal. No respiratory distress.     Breath sounds: Normal breath sounds. No wheezing or rales.  Chest:     Chest wall: No tenderness.  Musculoskeletal:        General: No edema.     Cervical back: Normal range of motion and neck supple.  Neurological:     Mental Status: She is alert and oriented to person, place, and time.  Psychiatric:        Mood and Affect: Mood and affect normal.    BP 128/76 (BP Location: Left Arm, Patient Position: Sitting, Cuff Size: Large)   Pulse (!) 101   Resp 18   Wt 300 lb (136.1 kg)   SpO2 98%   BMI 46.99 kg/m  Wt Readings from Last 3 Encounters:  09/06/20 300 lb (136.1 kg)  08/17/20 290 lb 9.6 oz (131.8 kg)  12/15/19 277 lb 3.2 oz (125.7 kg)     Lab Results  Component Value Date   WBC 5.6 02/25/2019   HGB 13.7 02/25/2019   HCT 39.9 02/25/2019   PLT 231.0 02/25/2019   GLUCOSE 89 02/25/2019   CHOL 154 02/25/2019   TRIG 50.0 02/25/2019   HDL 63.20 02/25/2019   LDLCALC 81 02/25/2019   ALT 16 02/25/2019   AST 15 02/25/2019   NA 138 02/25/2019   K 3.7 02/25/2019   CL 102 02/25/2019   CREATININE 0.75 02/25/2019   BUN 14 02/25/2019   CO2 28 02/25/2019   TSH 2.91 02/25/2019   HGBA1C 5.4 06/06/2013    CT MAXILLOFACIAL LTD WO CM  Result Date: 08/23/2020 CLINICAL DATA:  Maxillofacial pain, persistent sinus pressure EXAM: CT PARANASAL SINUS LIMITED WITHOUT CONTRAST TECHNIQUE: Non-contiguous multidetector CT images of the paranasal sinuses  were obtained in a single plane without contrast. COMPARISON:  None. FINDINGS: Mild rightward deviation of the nasal septum without spur. Visualized portions of the paranasal sinuses are clear. No significant nasal cavity opacification. IMPRESSION: Visualized portions of paranasal sinuses are clear. Electronically Signed   By: Guadlupe Spanish M.D.   On: 08/23/2020 09:19     Assessment & Plan:  Plan  I am having [name] maintain her tiZANidine, mometasone, ondansetron, amphetamine-dextroamphetamine, predniSONE, meclizine, levofloxacin, albuterol, Incassia, pantoprazole, traMADol, and HYDROcodone-acetaminophen.  No orders of the defined types were placed in this encounter.   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  [name], DO

## 2020-09-06 NOTE — Assessment & Plan Note (Signed)
Stable con't meds 

## 2020-09-20 ENCOUNTER — Encounter: Payer: Self-pay | Admitting: Family Medicine

## 2020-09-20 NOTE — Telephone Encounter (Signed)
Ok to send albuterol 2 puffs qid prn #1  2 refills

## 2020-09-20 NOTE — Telephone Encounter (Signed)
Please advise 

## 2020-09-23 ENCOUNTER — Other Ambulatory Visit: Payer: Self-pay | Admitting: Family Medicine

## 2020-09-23 DIAGNOSIS — J111 Influenza due to unidentified influenza virus with other respiratory manifestations: Secondary | ICD-10-CM

## 2020-09-23 MED ORDER — ALBUTEROL SULFATE HFA 108 (90 BASE) MCG/ACT IN AERS: 2.0000 | INHALATION_SPRAY | Freq: Four times a day (QID) | RESPIRATORY_TRACT | 0 refills | Status: DC | PRN

## 2020-09-23 NOTE — Telephone Encounter (Signed)
I think it is the same med--- just the generic

## 2020-09-24 ENCOUNTER — Ambulatory Visit: Payer: BC Managed Care – PPO | Admitting: Family Medicine

## 2020-09-24 NOTE — Telephone Encounter (Signed)
Refill sent.

## 2020-10-04 ENCOUNTER — Other Ambulatory Visit: Payer: Self-pay | Admitting: Family Medicine

## 2020-10-04 DIAGNOSIS — G43809 Other migraine, not intractable, without status migrainosus: Secondary | ICD-10-CM

## 2020-10-04 DIAGNOSIS — M797 Fibromyalgia: Secondary | ICD-10-CM

## 2020-10-07 MED ORDER — HYDROCODONE-ACETAMINOPHEN 10-325 MG PO TABS
1.0000 | ORAL_TABLET | Freq: Four times a day (QID) | ORAL | 0 refills | Status: DC | PRN
Start: 2020-10-07 — End: 2020-11-15

## 2020-10-07 MED ORDER — TRAMADOL HCL 50 MG PO TABS
50.0000 mg | ORAL_TABLET | Freq: Three times a day (TID) | ORAL | 0 refills | Status: DC | PRN
Start: 2020-10-07 — End: 2020-11-15

## 2020-10-07 NOTE — Telephone Encounter (Signed)
Patient last seen 09-06-20, prescriptions last sent 09-03-20 for a 30 day supply

## 2020-10-17 ENCOUNTER — Other Ambulatory Visit: Payer: Self-pay | Admitting: Family Medicine

## 2020-10-17 DIAGNOSIS — M62838 Other muscle spasm: Secondary | ICD-10-CM

## 2020-10-17 DIAGNOSIS — G43809 Other migraine, not intractable, without status migrainosus: Secondary | ICD-10-CM

## 2020-10-17 DIAGNOSIS — R110 Nausea: Secondary | ICD-10-CM

## 2020-10-19 ENCOUNTER — Other Ambulatory Visit: Payer: Self-pay | Admitting: Family Medicine

## 2020-10-20 ENCOUNTER — Other Ambulatory Visit: Payer: Self-pay

## 2020-10-20 ENCOUNTER — Telehealth (INDEPENDENT_AMBULATORY_CARE_PROVIDER_SITE_OTHER): Payer: BC Managed Care – PPO | Admitting: Psychiatry

## 2020-10-20 DIAGNOSIS — F988 Other specified behavioral and emotional disorders with onset usually occurring in childhood and adolescence: Secondary | ICD-10-CM

## 2020-10-20 DIAGNOSIS — F3342 Major depressive disorder, recurrent, in full remission: Secondary | ICD-10-CM

## 2020-10-20 DIAGNOSIS — F411 Generalized anxiety disorder: Secondary | ICD-10-CM

## 2020-10-20 DIAGNOSIS — F909 Attention-deficit hyperactivity disorder, unspecified type: Secondary | ICD-10-CM

## 2020-10-20 MED ORDER — AMPHETAMINE-DEXTROAMPHETAMINE 30 MG PO TABS: 30.0000 mg | ORAL_TABLET | Freq: Two times a day (BID) | ORAL | 0 refills | Status: AC

## 2020-10-20 MED ORDER — TRINTELLIX 10 MG PO TABS: 30.0000 mg | ORAL_TABLET | Freq: Every day | ORAL | 0 refills | Status: AC
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
## 2020-10-21 ENCOUNTER — Other Ambulatory Visit: Payer: Self-pay | Admitting: Family Medicine

## 2020-10-21 DIAGNOSIS — J069 Acute upper respiratory infection, unspecified: Secondary | ICD-10-CM

## 2020-11-15 ENCOUNTER — Other Ambulatory Visit: Payer: Self-pay | Admitting: Family Medicine

## 2020-11-15 DIAGNOSIS — G43809 Other migraine, not intractable, without status migrainosus: Secondary | ICD-10-CM

## 2020-11-15 DIAGNOSIS — M797 Fibromyalgia: Secondary | ICD-10-CM

## 2020-11-16 MED ORDER — TRAMADOL HCL 50 MG PO TABS
50.0000 mg | ORAL_TABLET | Freq: Three times a day (TID) | ORAL | 0 refills | Status: DC | PRN
Start: 2020-11-16 — End: 2021-01-04

## 2020-11-16 MED ORDER — HYDROCODONE-ACETAMINOPHEN 10-325 MG PO TABS
1.0000 | ORAL_TABLET | Freq: Four times a day (QID) | ORAL | 0 refills | Status: DC | PRN
Start: 2020-11-16 — End: 2021-01-04

## 2020-11-16 NOTE — Telephone Encounter (Signed)
Requesting: hydrocodone and tramadol Contract: 09/06/19 UDS: 02/25/19 Last Visit: 09/06/20 Next Visit: none Last Refill: 10/07/20  Please Advise

## 2020-11-28 ENCOUNTER — Other Ambulatory Visit: Payer: Self-pay | Admitting: Family Medicine

## 2020-11-28 DIAGNOSIS — M62838 Other muscle spasm: Secondary | ICD-10-CM

## 2020-11-28 DIAGNOSIS — R110 Nausea: Secondary | ICD-10-CM

## 2020-12-03 ENCOUNTER — Encounter: Payer: Self-pay | Admitting: Family Medicine

## 2020-12-03 ENCOUNTER — Telehealth (INDEPENDENT_AMBULATORY_CARE_PROVIDER_SITE_OTHER): Payer: BC Managed Care – PPO | Admitting: Family Medicine

## 2020-12-03 ENCOUNTER — Other Ambulatory Visit: Payer: Self-pay

## 2020-12-03 VITALS — BP 138/90 | HR 90

## 2020-12-03 DIAGNOSIS — G4452 New daily persistent headache (NDPH): Secondary | ICD-10-CM

## 2020-12-03 DIAGNOSIS — I10 Essential (primary) hypertension: Secondary | ICD-10-CM | POA: Diagnosis not present

## 2020-12-03 MED ORDER — METOPROLOL SUCCINATE ER 25 MG PO TB24: 25.0000 mg | ORAL_TABLET | Freq: Every day | ORAL | 3 refills | Status: DC

## 2020-12-03 NOTE — Assessment & Plan Note (Signed)
Start metoprolol F/u 2-3 week s in office

## 2020-12-03 NOTE — Assessment & Plan Note (Signed)
toprol started for preventative F/u 2-3 weeks or sooner prn

## 2020-12-03 NOTE — Progress Notes (Signed)
Virtual telephone visit    Virtual Visit via Telephone Note   This visit type was conducted due to national recommendations for restrictions regarding the COVID-19 Pandemic (e.g. social distancing) in an effort to limit this patient's exposure and mitigate transmission in our community. Due to her co-morbid illnesses, this patient is at least at moderate risk for complications without adequate follow up. This format is felt to be most appropriate for this patient at this time. The patient did not have access to video technology or had technical difficulties with video requiring transitioning to audio format only (telephone). Physical exam was limited to content and character of the telephone converstion. Luster Landsberg was able to get the patient set up on a telephone visit.  Video connection was lost at <50% of the duration of the visit, at which time the remainder of the visit was completed via audio only.  Patient location: home alone Patient and provider in visit Provider location: Office  I discussed the limitations of evaluation and management by telemedicine and the availability of in person appointments. The patient expressed understanding and agreed to proceed.   Visit Date: 12/03/2020  Today's healthcare provider: [name], DO     Subjective:    Patient ID: Jo Jackson, female    DOB: 07/02/1971, 50 y.o.   MRN: [id number]  No chief complaint on file.   HPI Patient is in today for migraine daily   She was also seen at urgent care 4/26 and was treated for bronchitis and went again this past tues/ wed and was neg again ---- she was on levaquin and pred ---- they also did a cxr and no pneumonia    She is now just taking prednisone,   Past Medical History:  Diagnosis Date  . Abnormal Pap smear    Repeats WNL  . ADD (attention deficit disorder)   . Anxiety   . Arthritis    Oseteoarthritis  . Depression   . GERD (gastroesophageal reflux disease)    . Headache(784.0)    Migraines  . Junctional escape rhythm   . Neuromuscular disorder (HCC)    Fibromyalgia  . Pregnancy induced hypertension   . Supraventricular tachycardia [facility])     Past Surgical History:  Procedure Laterality Date  . ABLATION OF DYSRHYTHMIC FOCUS    . APPENDECTOMY  1986  . CARPAL TUNNEL RELEASE  2000  . CERVICAL FUSION  08-24-99,04-26-00,05-09-01  . Knee sugery Left    16 yrs. old  . SHOULDER SURGERY  left-6-04, right 09-12-04  . tongue polyp  1984    Family History  Problem Relation Age of Onset  . Hodgkin's lymphoma Father   . COPD Mother   . Heart disease Mother        chf  . Depression Mother   . Cancer Paternal Aunt        breast  . Diabetes Other   . Thyroid disease Other   . Hypertension Other   . Depression Other   . Bipolar disorder Other   . ADD / ADHD Brother     Social History   Socioeconomic History  . Marital status: Married    Spouse name: Not on file  . Number of children: 2  . Years of education: Not on file  . Highest education level: Not on file  Occupational History    Employer: [employer]    Comment: P/T  . 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    Employer: [employer]  Tobacco Use  .  Smoking status: Former Smoker    Quit date: 10/23/2007    Years since quitting: 13.1  . Smokeless tobacco: Never Used  Vaping Use  . Vaping Use: Never used  Substance and Sexual Activity  . Alcohol use: Yes    Alcohol/week: 0.0 standard drinks    Comment: Occ  . Drug use: No  . Sexual activity: Yes  Other Topics Concern  . Not on file  Social History Narrative   G1p1      Exercise- no   Social Determinants of Health   Financial Resource Strain: Not on file  Food Insecurity: Not on file  Transportation Needs: Not on file  Physical Activity: Not on file  Stress: Not on file  Social Connections: Not on file  Intimate Partner Violence: Not on file    Outpatient Medications Prior to Visit  Medication Sig Dispense Refill  .  albuterol (VENTOLIN HFA) 108 (90 Base) MCG/ACT inhaler Inhale 2 puffs into the lungs every 6 (six) hours as needed for wheezing or shortness of breath. 18 g 5  . albuterol (VENTOLIN HFA) 108 (90 Base) MCG/ACT inhaler TAKE 2 PUFFS BY MOUTH EVERY 6 HOURS AS NEEDED FOR WHEEZE OR SHORTNESS OF BREATH 18 g 3  . amphetamine-dextroamphetamine (ADDERALL) 30 MG tablet Take 1 tablet by mouth 2 (two) times daily. 180 tablet 0  . HYDROcodone-acetaminophen (NORCO) 10-325 MG tablet Take 1 tablet by mouth every 6 (six) hours as needed. 30 tablet 0  . INCASSIA 0.35 MG tablet TAKE 1 TABLET BY MOUTH EVERY DAY 84 tablet 1  . levofloxacin (LEVAQUIN) 500 MG tablet Take 1 tablet (500 mg total) by mouth daily. 7 tablet 0  . meclizine (ANTIVERT) 25 MG tablet Take 1 tablet (25 mg total) by mouth 3 (three) times daily as needed for dizziness. 30 tablet 0  . mometasone (NASONEX) 50 MCG/ACT nasal spray PLACE 2 SPRAYS INTO THE NOSE DAILY. 51 each 4  . ondansetron (ZOFRAN-ODT) 8 MG disintegrating tablet TAKE 1 TABLET BY MOUTH EVERY 8 HOURS AS NEEDED FOR NAUSEA OR VOMITING. 20 tablet 1  . pantoprazole (PROTONIX) 40 MG tablet TAKE 1 TABLET BY MOUTH EVERY DAY 90 tablet 3  . predniSONE (DELTASONE) 10 MG tablet TAKE 3 TABLETS PO QD FOR 3 DAYS THEN TAKE 2 TABLETS PO QD FOR 3 DAYS THEN TAKE 1 TABLET PO QD FOR 3 DAYS THEN TAKE 1/2 TAB PO QD FOR 3 DAYS 20 tablet 0  . SUMAtriptan (IMITREX) 50 MG tablet TAKE 1 TABLET BY MOUTH ONCE AS NEEDED FOR HEADACHE. MAY REPEAT IN 2 HOURS IF HEADACHE PERSISTS OR RECURS. 10 tablet 3  . tiZANidine (ZANAFLEX) 4 MG tablet TAKE 1 TABLET (4 MG TOTAL) BY MOUTH EVERY 6 (SIX) HOURS AS NEEDED FOR MUSCLE SPASMS. 90 tablet 1  . traMADol (ULTRAM) 50 MG tablet Take 1 tablet (50 mg total) by mouth every 8 (eight) hours as needed. 30 tablet 0  . [START ON 12/10/2020] TRINTELLIX 10 MG TABS tablet Take 3 tablets (30 mg total) by mouth daily. 270 tablet 0   No facility-administered medications prior to visit.     Allergies  Allergen Reactions  . Ketorolac Tromethamine Hives  . Morphine Other (See Comments)    "makes me the devil."  . Latex Rash and Other (See Comments)    Latex catheter caused extreme irritation    Review of Systems  Constitutional: Negative for fever and malaise/fatigue.  HENT: Negative for congestion, sinus pain and sore throat.   Eyes: Negative for blurred vision.  Respiratory: Negative for shortness of breath.   Cardiovascular: Negative for chest pain, palpitations and leg swelling.  Gastrointestinal: Negative for abdominal pain, blood in stool and nausea.  Genitourinary: Negative for dysuria and frequency.  Musculoskeletal: Negative for falls.  Skin: Negative for rash.  Neurological: Positive for headaches. Negative for dizziness and loss of consciousness.  Endo/Heme/Allergies: Negative for environmental allergies.  Psychiatric/Behavioral: Negative for depression. The patient is not nervous/anxious.        Objective:    Physical Exam Vitals and nursing note reviewed.  Neurological:     Mental Status: She is alert.  Psychiatric:        Mood and Affect: Mood normal.        Behavior: Behavior normal.        Thought Content: Thought content normal.     BP 138/90   Pulse 90  Wt Readings from Last 3 Encounters:  09/06/20 300 lb (136.1 kg)  08/17/20 290 lb 9.6 oz (131.8 kg)  12/15/19 277 lb 3.2 oz (125.7 kg)    Diabetic Foot Exam - Simple   No data filed    Lab Results  Component Value Date   WBC 5.6 02/25/2019   HGB 13.7 02/25/2019   HCT 39.9 02/25/2019   PLT 231.0 02/25/2019   GLUCOSE 89 02/25/2019   CHOL 154 02/25/2019   TRIG 50.0 02/25/2019   HDL 63.20 02/25/2019   LDLCALC 81 02/25/2019   ALT 16 02/25/2019   AST 15 02/25/2019   NA 138 02/25/2019   K 3.7 02/25/2019   CL 102 02/25/2019   CREATININE 0.75 02/25/2019   BUN 14 02/25/2019   CO2 28 02/25/2019   TSH 2.91 02/25/2019   HGBA1C 5.4 06/06/2013    Lab Results  Component Value  Date   TSH 2.91 02/25/2019   Lab Results  Component Value Date   WBC 5.6 02/25/2019   HGB 13.7 02/25/2019   HCT 39.9 02/25/2019   MCV 84.2 02/25/2019   PLT 231.0 02/25/2019   Lab Results  Component Value Date   NA 138 02/25/2019   K 3.7 02/25/2019   CO2 28 02/25/2019   GLUCOSE 89 02/25/2019   BUN 14 02/25/2019   CREATININE 0.75 02/25/2019   BILITOT 0.7 02/25/2019   ALKPHOS 59 02/25/2019   AST 15 02/25/2019   ALT 16 02/25/2019   PROT 6.5 02/25/2019   ALBUMIN 4.0 02/25/2019   CALCIUM 9.4 02/25/2019   GFR 82.79 02/25/2019   Lab Results  Component Value Date   CHOL 154 02/25/2019   Lab Results  Component Value Date   HDL 63.20 02/25/2019   Lab Results  Component Value Date   LDLCALC 81 02/25/2019   Lab Results  Component Value Date   TRIG 50.0 02/25/2019   Lab Results  Component Value Date   CHOLHDL 2 02/25/2019   Lab Results  Component Value Date   HGBA1C 5.4 06/06/2013   Narrative & Impression  CLINICAL DATA:  Maxillofacial pain, persistent sinus pressure  EXAM: CT PARANASAL SINUS LIMITED WITHOUT CONTRAST  TECHNIQUE: Non-contiguous multidetector CT images of the paranasal sinuses were obtained in a single plane without contrast.  COMPARISON:  None.  FINDINGS: Mild rightward deviation of the nasal septum without spur. Visualized portions of the paranasal sinuses are clear. No significant nasal cavity opacification.  IMPRESSION: Visualized portions of paranasal sinuses are clear.   Electronically Signed   By: Guadlupe SpanishPraneil  Patel M.D.   On: 08/23/2020 09:19       Assessment & Plan:  Problem List Items Addressed This Visit      Unprioritized   Headache    toprol started for preventative F/u 2-3 weeks or sooner prn        Relevant Medications   metoprolol succinate (TOPROL-XL) 25 MG 24 hr tablet   Primary hypertension - Primary    Start metoprolol F/u 2-3 week s in office      Relevant Medications   metoprolol succinate  (TOPROL-XL) 25 MG 24 hr tablet      I am having Jo Jackson start on metoprolol succinate. I am also having her maintain her tiZANidine, predniSONE, meclizine, levofloxacin, albuterol, Incassia, pantoprazole, SUMAtriptan, albuterol, Trintellix, amphetamine-dextroamphetamine, mometasone, traMADol, HYDROcodone-acetaminophen, and ondansetron.  Meds ordered this encounter  Medications  . metoprolol succinate (TOPROL-XL) 25 MG 24 hr tablet    Sig: Take 1 tablet (25 mg total) by mouth daily.    Dispense:  90 tablet    Refill:  3     I discussed the assessment and treatment plan with the patient. The patient was provided an opportunity to ask questions and all were answered. The patient agreed with the plan and demonstrated an understanding of the instructions.   The patient was advised to call back or seek an in-person evaluation if the symptoms worsen or if the condition fails to improve as anticipated.  I provided 25 minutes of non-face-to-face time during this encounter.   [name], DO [facility] [phone] (phone) [phone] (fax)  [facility]

## 2020-12-13 ENCOUNTER — Telehealth (HOSPITAL_COMMUNITY): Payer: Self-pay

## 2020-12-13 NOTE — Telephone Encounter (Signed)
Medication management - Telephone call with pt to inform that we would have to refer her medication management services out of our [facility] Outpatient practices as the provider that was going to take retired Dr. Stanton Kidney caseload was now not an option. Informed our practice would not have a covering provider after 12/21/20 and that since our other providers already had full caseloads, we now would be sending patient a list of possible other providers in the community or she could set up services with others that she secures for ongoing treatment.  Informed patient her appointment for 12/16/20 would be cancelled but also verified with patient this nurse spoke to [facility], a pharmacist at her [facility] in [place] to verify she still has a 90 day order for Adderall and Trintellix on file to use while locating a new provider.  Patient to call back if any issues during transition and stated she understood no other orders would be provided after the next 30 days.

## 2020-12-16 ENCOUNTER — Telehealth (HOSPITAL_COMMUNITY): Payer: BC Managed Care – PPO | Admitting: Psychiatry

## 2020-12-21 ENCOUNTER — Other Ambulatory Visit: Payer: Self-pay

## 2020-12-21 ENCOUNTER — Other Ambulatory Visit: Payer: Self-pay | Admitting: Family Medicine

## 2020-12-21 ENCOUNTER — Ambulatory Visit: Payer: BC Managed Care – PPO | Admitting: Family Medicine

## 2020-12-21 ENCOUNTER — Encounter: Payer: Self-pay | Admitting: Family Medicine

## 2020-12-21 VITALS — BP 134/86 | HR 80 | Temp 99.1°F | Resp 18 | Ht 67.0 in | Wt 290.4 lb

## 2020-12-21 DIAGNOSIS — G43809 Other migraine, not intractable, without status migrainosus: Secondary | ICD-10-CM | POA: Diagnosis not present

## 2020-12-21 DIAGNOSIS — R110 Nausea: Secondary | ICD-10-CM

## 2020-12-21 DIAGNOSIS — M62838 Other muscle spasm: Secondary | ICD-10-CM

## 2020-12-21 DIAGNOSIS — I10 Essential (primary) hypertension: Secondary | ICD-10-CM

## 2020-12-21 DIAGNOSIS — G43909 Migraine, unspecified, not intractable, without status migrainosus: Secondary | ICD-10-CM | POA: Insufficient documentation

## 2020-12-21 MED ORDER — SUMATRIPTAN SUCCINATE 100 MG PO TABS: 100.0000 mg | ORAL_TABLET | ORAL | 0 refills | Status: DC | PRN

## 2020-12-21 MED ORDER — METOPROLOL SUCCINATE ER 50 MG PO TB24: 50.0000 mg | ORAL_TABLET | Freq: Every day | ORAL | 3 refills | Status: DC

## 2020-12-21 MED ORDER — TIZANIDINE HCL 4 MG PO TABS: 4.0000 mg | ORAL_TABLET | Freq: Four times a day (QID) | ORAL | 1 refills | Status: DC | PRN

## 2020-12-21 NOTE — Assessment & Plan Note (Signed)
Inc imitrex to 100 mg --- no more than 2 in 24h Refer to neuro if no better

## 2020-12-21 NOTE — Progress Notes (Signed)
Subjective:   By signing my name below, I, [name], attest that this documentation has been prepared under the direction and in the presence of Dr. Seabron Spates, DO. 12/21/2020      Patient ID: Jo Jackson, female    DOB: [birth year], 50 y.o.   MRN: [id number]  Chief Complaint  Patient presents with  . Hypertension  . Follow-up    HPI Patient is in today for a office visit.  Her blood pressure is elevated during this visit. She did not take her blood pressure medication this morning. She has not recently checked her BP at home. She continues to experience migraines. She takes 50 mg Imitrex PO to manage her migraines but finds mild relief. She takes at most 4 tablets of Imitrex during episodes of migraines. She is requesting a refill for 4 mg Zanaflex PO.   Past Medical History:  Diagnosis Date  . Abnormal Pap smear    Repeats WNL  . ADD (attention deficit disorder)   . Anxiety   . Arthritis    Oseteoarthritis  . Depression   . GERD (gastroesophageal reflux disease)   . Headache(784.0)    Migraines  . Junctional escape rhythm   . Neuromuscular disorder (HCC)    Fibromyalgia  . Pregnancy induced hypertension   . Supraventricular tachycardia [facility])     Past Surgical History:  Procedure Laterality Date  . ABLATION OF DYSRHYTHMIC FOCUS    . APPENDECTOMY  1986  . CARPAL TUNNEL RELEASE  2000  . CERVICAL FUSION  08-24-99,04-26-00,05-09-01  . Knee sugery Left    16 yrs. old  . SHOULDER SURGERY  left-6-04, right 09-12-04  . tongue polyp  1984    Family History  Problem Relation Age of Onset  . Hodgkin's lymphoma Father   . COPD Mother   . Heart disease Mother        chf  . Depression Mother   . Cancer Paternal Aunt        breast  . Diabetes Other   . Thyroid disease Other   . Hypertension Other   . Depression Other   . Bipolar disorder Other   . ADD / ADHD Brother     Social History   Socioeconomic History  . Marital status: Married     Spouse name: Not on file  . Number of children: 2  . Years of education: Not on file  . Highest education level: Not on file  Occupational History    Employer: [employer]    Comment: P/T  . 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    Employer: [employer]  Tobacco Use  . Smoking status: Former Smoker    Quit date: 10/23/2007    Years since quitting: 13.1  . Smokeless tobacco: Never Used  Vaping Use  . Vaping Use: Never used  Substance and Sexual Activity  . Alcohol use: Yes    Alcohol/week: 0.0 standard drinks    Comment: Occ  . Drug use: No  . Sexual activity: Yes  Other Topics Concern  . Not on file  Social History Narrative   G1p1      Exercise- no   Social Determinants of Health   Financial Resource Strain: Not on file  Food Insecurity: Not on file  Transportation Needs: Not on file  Physical Activity: Not on file  Stress: Not on file  Social Connections: Not on file  Intimate Partner Violence: Not on file    Outpatient Medications Prior to Visit  Medication Sig Dispense  Refill  . albuterol (VENTOLIN HFA) 108 (90 Base) MCG/ACT inhaler Inhale 2 puffs into the lungs every 6 (six) hours as needed for wheezing or shortness of breath. 18 g 5  . albuterol (VENTOLIN HFA) 108 (90 Base) MCG/ACT inhaler TAKE 2 PUFFS BY MOUTH EVERY 6 HOURS AS NEEDED FOR WHEEZE OR SHORTNESS OF BREATH 18 g 3  . amphetamine-dextroamphetamine (ADDERALL) 30 MG tablet Take 1 tablet by mouth 2 (two) times daily. 180 tablet 0  . HYDROcodone-acetaminophen (NORCO) 10-325 MG tablet Take 1 tablet by mouth every 6 (six) hours as needed. 30 tablet 0  . INCASSIA 0.35 MG tablet TAKE 1 TABLET BY MOUTH EVERY DAY 84 tablet 1  . levofloxacin (LEVAQUIN) 500 MG tablet Take 1 tablet (500 mg total) by mouth daily. 7 tablet 0  . meclizine (ANTIVERT) 25 MG tablet Take 1 tablet (25 mg total) by mouth 3 (three) times daily as needed for dizziness. 30 tablet 0  . mometasone (NASONEX) 50 MCG/ACT nasal spray PLACE 2 SPRAYS INTO THE  NOSE DAILY. 51 each 4  . ondansetron (ZOFRAN-ODT) 8 MG disintegrating tablet TAKE 1 TABLET BY MOUTH EVERY 8 HOURS AS NEEDED FOR NAUSEA OR VOMITING. 20 tablet 1  . pantoprazole (PROTONIX) 40 MG tablet TAKE 1 TABLET BY MOUTH EVERY DAY 90 tablet 3  . predniSONE (DELTASONE) 10 MG tablet TAKE 3 TABLETS PO QD FOR 3 DAYS THEN TAKE 2 TABLETS PO QD FOR 3 DAYS THEN TAKE 1 TABLET PO QD FOR 3 DAYS THEN TAKE 1/2 TAB PO QD FOR 3 DAYS 20 tablet 0  . traMADol (ULTRAM) 50 MG tablet Take 1 tablet (50 mg total) by mouth every 8 (eight) hours as needed. 30 tablet 0  . TRINTELLIX 10 MG TABS tablet Take 3 tablets (30 mg total) by mouth daily. 270 tablet 0  . metoprolol succinate (TOPROL-XL) 25 MG 24 hr tablet Take 1 tablet (25 mg total) by mouth daily. 90 tablet 3  . SUMAtriptan (IMITREX) 50 MG tablet TAKE 1 TABLET BY MOUTH ONCE AS NEEDED FOR HEADACHE. MAY REPEAT IN 2 HOURS IF HEADACHE PERSISTS OR RECURS. 10 tablet 3  . tiZANidine (ZANAFLEX) 4 MG tablet TAKE 1 TABLET (4 MG TOTAL) BY MOUTH EVERY 6 (SIX) HOURS AS NEEDED FOR MUSCLE SPASMS. 90 tablet 1   No facility-administered medications prior to visit.    Allergies  Allergen Reactions  . Ketorolac Tromethamine Hives  . Morphine Other (See Comments)    "makes me the devil."  . Latex Rash and Other (See Comments)    Latex catheter caused extreme irritation    Review of Systems  Constitutional: Negative for chills, fever and malaise/fatigue.  HENT: Negative for congestion and hearing loss.   Eyes: Negative for discharge.  Respiratory: Negative for cough, sputum production and shortness of breath.   Cardiovascular: Negative for chest pain, palpitations and leg swelling.  Gastrointestinal: Negative for abdominal pain, blood in stool, constipation, diarrhea, heartburn, nausea and vomiting.  Genitourinary: Negative for dysuria, frequency, hematuria and urgency.  Musculoskeletal: Negative for back pain, falls and myalgias.  Skin: Negative for rash.  Neurological:  Positive for headaches. Negative for dizziness, sensory change, loss of consciousness and weakness.  Endo/Heme/Allergies: Negative for environmental allergies. Does not bruise/bleed easily.  Psychiatric/Behavioral: Negative for depression and suicidal ideas. The patient is not nervous/anxious and does not have insomnia.        Objective:    Physical Exam Vitals and nursing note reviewed.  Constitutional:      General: She is not  in acute distress.    Appearance: Normal appearance. She is not ill-appearing.  HENT:     Head: Normocephalic and atraumatic.     Right Ear: External ear normal.     Left Ear: External ear normal.  Eyes:     Extraocular Movements: Extraocular movements intact.     Pupils: Pupils are equal, round, and reactive to light.  Cardiovascular:     Rate and Rhythm: Normal rate and regular rhythm.     Pulses: Normal pulses.     Heart sounds: Normal heart sounds. No murmur heard. No gallop.      Comments: Blood pressure is elevated. Otherwise exam is normal. Pulmonary:     Effort: Pulmonary effort is normal. No respiratory distress.     Breath sounds: Normal breath sounds. No wheezing, rhonchi or rales.  Skin:    General: Skin is warm and dry.  Neurological:     General: No focal deficit present.     Mental Status: She is alert and oriented to person, place, and time.     Motor: No weakness.     Gait: Gait normal.  Psychiatric:        Behavior: Behavior normal.     BP 134/86 (BP Location: Left Arm, Patient Position: Sitting, Cuff Size: Large)   Pulse 80   Temp 99.1 F (37.3 C) (Oral)   Resp 18   Ht 5\' 7"  (1.702 m)   Wt 290 lb 6.4 oz (131.7 kg)   SpO2 96%   BMI 45.48 kg/m  Wt Readings from Last 3 Encounters:  12/21/20 290 lb 6.4 oz (131.7 kg)  09/06/20 300 lb (136.1 kg)  08/17/20 290 lb 9.6 oz (131.8 kg)    Diabetic Foot Exam - Simple   No data filed    Lab Results  Component Value Date   WBC 5.6 02/25/2019   HGB 13.7 02/25/2019   HCT 39.9  02/25/2019   PLT 231.0 02/25/2019   GLUCOSE 89 02/25/2019   CHOL 154 02/25/2019   TRIG 50.0 02/25/2019   HDL 63.20 02/25/2019   LDLCALC 81 02/25/2019   ALT 16 02/25/2019   AST 15 02/25/2019   NA 138 02/25/2019   K 3.7 02/25/2019   CL 102 02/25/2019   CREATININE 0.75 02/25/2019   BUN 14 02/25/2019   CO2 28 02/25/2019   TSH 2.91 02/25/2019   HGBA1C 5.4 06/06/2013    Lab Results  Component Value Date   TSH 2.91 02/25/2019   Lab Results  Component Value Date   WBC 5.6 02/25/2019   HGB 13.7 02/25/2019   HCT 39.9 02/25/2019   MCV 84.2 02/25/2019   PLT 231.0 02/25/2019   Lab Results  Component Value Date   NA 138 02/25/2019   K 3.7 02/25/2019   CO2 28 02/25/2019   GLUCOSE 89 02/25/2019   BUN 14 02/25/2019   CREATININE 0.75 02/25/2019   BILITOT 0.7 02/25/2019   ALKPHOS 59 02/25/2019   AST 15 02/25/2019   ALT 16 02/25/2019   PROT 6.5 02/25/2019   ALBUMIN 4.0 02/25/2019   CALCIUM 9.4 02/25/2019   GFR 82.79 02/25/2019   Lab Results  Component Value Date   CHOL 154 02/25/2019   Lab Results  Component Value Date   HDL 63.20 02/25/2019   Lab Results  Component Value Date   LDLCALC 81 02/25/2019   Lab Results  Component Value Date   TRIG 50.0 02/25/2019   Lab Results  Component Value Date   CHOLHDL 2 02/25/2019  Lab Results  Component Value Date   HGBA1C 5.4 06/06/2013       Assessment & Plan:   Problem List Items Addressed This Visit      Unprioritized   Migraine    Inc imitrex to 100 mg --- no more than 2 in 24h Refer to neuro if no better       Relevant Medications   tiZANidine (ZANAFLEX) 4 MG tablet   metoprolol succinate (TOPROL-XL) 50 MG 24 hr tablet   SUMAtriptan (IMITREX) 100 MG tablet   Other Relevant Orders   Lipid panel   CBC with Differential/Platelet   Comprehensive metabolic panel   TSH   Muscle spasm   Relevant Medications   tiZANidine (ZANAFLEX) 4 MG tablet   Nausea   Primary hypertension - Primary    Poorly  controlled will alter medications, encouraged DASH diet, minimize caffeine and obtain adequate sleep. Report concerning symptoms and follow up as directed and as needed      Relevant Medications   metoprolol succinate (TOPROL-XL) 50 MG 24 hr tablet   Other Relevant Orders   Lipid panel   CBC with Differential/Platelet   Comprehensive metabolic panel   TSH       Meds ordered this encounter  Medications  . tiZANidine (ZANAFLEX) 4 MG tablet    Sig: Take 1 tablet (4 mg total) by mouth every 6 (six) hours as needed for muscle spasms.    Dispense:  90 tablet    Refill:  1  . metoprolol succinate (TOPROL-XL) 50 MG 24 hr tablet    Sig: Take 1 tablet (50 mg total) by mouth daily. Take with or immediately following a meal.    Dispense:  90 tablet    Refill:  3  . SUMAtriptan (IMITREX) 100 MG tablet    Sig: Take 1 tablet (100 mg total) by mouth every 2 (two) hours as needed for migraine. May repeat in 2 hours if headache persists or recurs.    Dispense:  10 tablet    Refill:  0    I, Dr. Seabron SpatesLowne-Chase, Vergil Burby, DO, personally preformed the services described in this documentation.  All medical record entries made by the scribe were at my direction and in my presence.  I have reviewed the chart and discharge instructions (if applicable) and agree that the record reflects my personal performance and is accurate and complete. 12/21/2020   I,[name],acting as a scribe for Donato SchultzYvonne R Lowne Chase, DO.,have documented all relevant documentation on the behalf of Donato SchultzYvonne R Lowne Chase, DO,as directed by  Donato SchultzYvonne R Lowne Chase, DO while in the presence of Donato SchultzYvonne R Lowne Chase, DO.   Donato SchultzYvonne R Lowne Chase, DO

## 2020-12-21 NOTE — Patient Instructions (Signed)

## 2020-12-21 NOTE — Assessment & Plan Note (Signed)
Poorly controlled will alter medications, encouraged DASH diet, minimize caffeine and obtain adequate sleep. Report concerning symptoms and follow up as directed and as needed 

## 2020-12-22 ENCOUNTER — Encounter: Payer: Self-pay | Admitting: Family Medicine

## 2020-12-22 ENCOUNTER — Telehealth: Payer: Self-pay

## 2020-12-22 ENCOUNTER — Other Ambulatory Visit: Payer: Self-pay

## 2020-12-22 LAB — COMPREHENSIVE METABOLIC PANEL
ALT: 15 U/L (ref 0–35)
AST: 17 U/L (ref 0–37)
Albumin: 4 g/dL (ref 3.5–5.2)
Alkaline Phosphatase: 57 U/L (ref 39–117)
BUN: 16 mg/dL (ref 6–23)
CO2: 27 mEq/L (ref 19–32)
Calcium: 9 mg/dL (ref 8.4–10.5)
Chloride: 104 mEq/L (ref 96–112)
Creatinine, Ser: 0.79 mg/dL (ref 0.40–1.20)
GFR: 88.14 mL/min (ref >60.00)
Glucose, Bld: 90 mg/dL (ref 70–99)
Potassium: 3.7 mEq/L (ref 3.5–5.1)
Sodium: 139 mEq/L (ref 135–145)
Total Bilirubin: 0.7 mg/dL (ref 0.2–1.2)
Total Protein: 6.1 g/dL (ref 6.0–8.3)

## 2020-12-22 LAB — CBC WITH DIFFERENTIAL/PLATELET
Basophils Absolute: 0.1 10*3/uL (ref 0.0–0.1)
Basophils Relative: 1.1 % (ref 0.0–3.0)
Eosinophils Absolute: 0.1 10*3/uL (ref 0.0–0.7)
Eosinophils Relative: 1.7 % (ref 0.0–5.0)
HCT: 38.9 % (ref 36.0–46.0)
Hemoglobin: 13.5 g/dL (ref 12.0–15.0)
Lymphocytes Relative: 28.1 % (ref 12.0–46.0)
Lymphs Abs: 1.3 10*3/uL (ref 0.7–4.0)
MCHC: 34.6 g/dL (ref 30.0–36.0)
MCV: 83.2 fl (ref 78.0–100.0)
Monocytes Absolute: 0.4 10*3/uL (ref 0.1–1.0)
Monocytes Relative: 9.4 % (ref 3.0–12.0)
Neutro Abs: 2.8 10*3/uL (ref 1.4–7.7)
Neutrophils Relative %: 59.7 % (ref 43.0–77.0)
Platelets: 190 10*3/uL (ref 150.0–400.0)
RBC: 4.68 Mil/uL (ref 3.87–5.11)
RDW: 12.7 % (ref 11.5–15.5)
WBC: 4.6 10*3/uL (ref 4.0–10.5)

## 2020-12-22 LAB — LIPID PANEL
Cholesterol: 140 mg/dL (ref 0–200)
HDL: 47.8 mg/dL (ref >39.00)
LDL Cholesterol: 80 mg/dL (ref 0–99)
NonHDL: 91.82
Total CHOL/HDL Ratio: 3
Triglycerides: 59 mg/dL (ref 0.0–149.0)
VLDL: 11.8 mg/dL (ref 0.0–40.0)

## 2020-12-22 LAB — TSH: TSH: 1.67 u[IU]/mL (ref 0.35–4.50)

## 2020-12-22 NOTE — Telephone Encounter (Signed)
Pt called wanting to know her results. Specifically what she needs to do to lower cholesterol levels. I let her know that once you looked at her results we would inform her.

## 2020-12-23 NOTE — Telephone Encounter (Signed)
Pt is aware and voices understanding.  

## 2020-12-23 NOTE — Telephone Encounter (Signed)
Please remind the pt that she will see labs as soon as they are released and they need to wait a day or two until I am able to review Her labs were great

## 2021-01-04 ENCOUNTER — Other Ambulatory Visit: Payer: Self-pay

## 2021-01-04 ENCOUNTER — Other Ambulatory Visit: Payer: Self-pay | Admitting: Family Medicine

## 2021-01-04 DIAGNOSIS — M797 Fibromyalgia: Secondary | ICD-10-CM

## 2021-01-04 DIAGNOSIS — G43809 Other migraine, not intractable, without status migrainosus: Secondary | ICD-10-CM

## 2021-01-04 MED ORDER — SUMATRIPTAN SUCCINATE 100 MG PO TABS: 100.0000 mg | ORAL_TABLET | ORAL | 0 refills | Status: DC | PRN

## 2021-01-04 NOTE — Telephone Encounter (Signed)
Requesting: tramadol and hydrocodone Contract: 09/06/19 UDS: 09/06/19 Last Visit: 12/21/20 Next Visit: 03/24/21 Last Refill: 11/16/20  Please Advise

## 2021-01-05 MED ORDER — TRAMADOL HCL 50 MG PO TABS: 50.0000 mg | ORAL_TABLET | Freq: Three times a day (TID) | ORAL | 0 refills | Status: DC | PRN

## 2021-01-05 MED ORDER — HYDROCODONE-ACETAMINOPHEN 10-325 MG PO TABS: 1.0000 | ORAL_TABLET | Freq: Four times a day (QID) | ORAL | 0 refills | Status: DC | PRN

## 2021-01-28 ENCOUNTER — Other Ambulatory Visit: Payer: Self-pay | Admitting: Family Medicine

## 2021-01-28 DIAGNOSIS — M62838 Other muscle spasm: Secondary | ICD-10-CM

## 2021-02-01 ENCOUNTER — Other Ambulatory Visit: Payer: Self-pay | Admitting: Family Medicine

## 2021-02-01 DIAGNOSIS — G43809 Other migraine, not intractable, without status migrainosus: Secondary | ICD-10-CM

## 2021-02-14 ENCOUNTER — Other Ambulatory Visit: Payer: Self-pay | Admitting: Family Medicine

## 2021-03-03 ENCOUNTER — Other Ambulatory Visit: Payer: Self-pay | Admitting: Family Medicine

## 2021-03-03 ENCOUNTER — Encounter: Payer: Self-pay | Admitting: Family Medicine

## 2021-03-03 DIAGNOSIS — G43809 Other migraine, not intractable, without status migrainosus: Secondary | ICD-10-CM

## 2021-03-03 MED ORDER — SUMATRIPTAN SUCCINATE 100 MG PO TABS: 100.0000 mg | ORAL_TABLET | ORAL | 1 refills | Status: DC | PRN

## 2021-03-05 ENCOUNTER — Other Ambulatory Visit: Payer: Self-pay | Admitting: Family Medicine

## 2021-03-05 DIAGNOSIS — R110 Nausea: Secondary | ICD-10-CM

## 2021-03-05 DIAGNOSIS — M62838 Other muscle spasm: Secondary | ICD-10-CM

## 2021-03-06 ENCOUNTER — Other Ambulatory Visit: Payer: Self-pay | Admitting: Family Medicine

## 2021-03-06 DIAGNOSIS — R42 Dizziness and giddiness: Secondary | ICD-10-CM

## 2021-03-06 DIAGNOSIS — G43809 Other migraine, not intractable, without status migrainosus: Secondary | ICD-10-CM

## 2021-03-07 NOTE — Telephone Encounter (Signed)
Meclizine and tramadol  Requesting: tramadol Contract: 09/06/19 UDS: 09/06/19 Last Visit: 12/21/20 Next Visit: 03/24/21 Last Refill: 01/05/21  Please Advise

## 2021-03-14 ENCOUNTER — Other Ambulatory Visit: Payer: Self-pay | Admitting: Family Medicine

## 2021-03-14 DIAGNOSIS — M62838 Other muscle spasm: Secondary | ICD-10-CM

## 2021-03-24 ENCOUNTER — Other Ambulatory Visit: Payer: Self-pay

## 2021-03-24 ENCOUNTER — Ambulatory Visit: Payer: BC Managed Care – PPO | Admitting: Family Medicine

## 2021-03-24 ENCOUNTER — Encounter: Payer: Self-pay | Admitting: Family Medicine

## 2021-03-24 VITALS — BP 112/84 | HR 62 | Temp 98.1°F | Resp 18 | Ht 67.0 in | Wt 269.0 lb

## 2021-03-24 DIAGNOSIS — M62838 Other muscle spasm: Secondary | ICD-10-CM

## 2021-03-24 DIAGNOSIS — R42 Dizziness and giddiness: Secondary | ICD-10-CM | POA: Diagnosis not present

## 2021-03-24 DIAGNOSIS — M797 Fibromyalgia: Secondary | ICD-10-CM

## 2021-03-24 DIAGNOSIS — Z79899 Other long term (current) drug therapy: Secondary | ICD-10-CM

## 2021-03-24 DIAGNOSIS — I10 Essential (primary) hypertension: Secondary | ICD-10-CM

## 2021-03-24 DIAGNOSIS — Z6841 Body Mass Index (BMI) 40.0 and over, adult: Secondary | ICD-10-CM

## 2021-03-24 DIAGNOSIS — F3342 Major depressive disorder, recurrent, in full remission: Secondary | ICD-10-CM

## 2021-03-24 DIAGNOSIS — G43809 Other migraine, not intractable, without status migrainosus: Secondary | ICD-10-CM | POA: Diagnosis not present

## 2021-03-24 MED ORDER — SUMATRIPTAN SUCCINATE 100 MG PO TABS: 100.0000 mg | ORAL_TABLET | ORAL | 1 refills | Status: DC | PRN

## 2021-03-24 MED ORDER — TIZANIDINE HCL 4 MG PO TABS: 4.0000 mg | ORAL_TABLET | Freq: Four times a day (QID) | ORAL | 1 refills | Status: DC | PRN

## 2021-03-24 MED ORDER — HYDROCODONE-ACETAMINOPHEN 10-325 MG PO TABS: 1.0000 | ORAL_TABLET | Freq: Four times a day (QID) | ORAL | 0 refills | Status: DC | PRN

## 2021-03-24 MED ORDER — MECLIZINE HCL 25 MG PO TABS: ORAL_TABLET | ORAL | 0 refills | Status: DC

## 2021-03-24 NOTE — Assessment & Plan Note (Signed)
Cont with diet and weight loss Well controlled, no changes to meds. Encouraged heart healthy diet such as the DASH diet and exercise as tolerated.

## 2021-03-24 NOTE — Assessment & Plan Note (Signed)
stable °

## 2021-03-24 NOTE — Assessment & Plan Note (Signed)
Stable

## 2021-03-24 NOTE — Patient Instructions (Signed)

## 2021-03-24 NOTE — Progress Notes (Signed)
Subjective:   By signing my name below, I, Jo Jackson, attest that this documentation has been prepared under the direction and in the presence of Dr. Seabron Spates, DO. 03/24/2021    Patient ID: Jo Jackson, female    DOB: May 15, 1971, 50 y.o.   MRN: [id number]  Chief Complaint  Patient presents with   Hypertension    Pt states not checked bps at home    Follow-up    Hypertension Pertinent negatives include no blurred vision, chest pain, headaches, malaise/fatigue, palpitations or shortness of breath.  Patient is in today for a office visit.   Her husband has recently passed away. She is grieving at this time and is planning to seek counseling through hospice.  She continues having migraines at this time. She continues taking 100 mg Imitrex 2x PRN to manage her symptoms. She reports having a disk in her spine pinching her nerve which is causing her migraines.   She reports losing weight. She started maintaining a regular diet and reports doing well while on it. She is planning to started exercising regularly at a later time.   Wt Readings from Last 3 Encounters:  03/24/21 269 lb (122 kg)  12/21/20 290 lb 6.4 oz (131.7 kg)  09/06/20 300 lb (136.1 kg)   Her blood pressure is doing well during this visit. She continues taking 50 mg metoprolol succinate daily PO and reports no new issues while taking it.   BP Readings from Last 3 Encounters:  03/24/21 112/84  12/21/20 134/86  12/03/20 138/90   Pulse Readings from Last 3 Encounters:  03/24/21 62  12/21/20 80  12/03/20 90    Past Medical History:  Diagnosis Date   Abnormal Pap smear    Repeats WNL   ADD (attention deficit disorder)    Anxiety    Arthritis    Oseteoarthritis   Depression    GERD (gastroesophageal reflux disease)    Headache(784.0)    Migraines   Junctional escape rhythm    Neuromuscular disorder (HCC)    Fibromyalgia   Pregnancy induced hypertension    Supraventricular tachycardia  (HCC)     Past Surgical History:  Procedure Laterality Date   ABLATION OF DYSRHYTHMIC FOCUS     APPENDECTOMY  1986   CARPAL TUNNEL RELEASE  2000   CERVICAL FUSION  08-24-99,04-26-00,05-09-01   Knee sugery Left    16 yrs. old   SHOULDER SURGERY  left-6-04, right 09-12-04   tongue polyp  1984    Family History  Problem Relation Age of Onset   Hodgkin's lymphoma Father    COPD Mother    Heart disease Mother        chf   Depression Mother    Cancer Paternal Aunt        breast   Diabetes Other    Thyroid disease Other    Hypertension Other    Depression Other    Bipolar disorder Other    ADD / ADHD Brother     Social History   Socioeconomic History   Marital status: Married    Spouse name: Not on file   Number of children: 2   Years of education: Not on file   Highest education level: Not on file  Occupational History    Employer: [employer]    Comment: P/T   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    Employer: [employer]  Tobacco Use   Smoking status: Former    Types: Cigarettes    Quit date: 10/23/2007  Years since quitting: 13.4   Smokeless tobacco: Never  Vaping Use   Vaping Use: Never used  Substance and Sexual Activity   Alcohol use: Yes    Alcohol/week: 0.0 standard drinks    Comment: Occ   Drug use: No   Sexual activity: Yes  Other Topics Concern   Not on file  Social History Narrative   G1p1      Exercise- no   Social Determinants of Health   Financial Resource Strain: Not on file  Food Insecurity: Not on file  Transportation Needs: Not on file  Physical Activity: Not on file  Stress: Not on file  Social Connections: Not on file  Intimate Partner Violence: Not on file    Outpatient Medications Prior to Visit  Medication Sig Dispense Refill   albuterol (VENTOLIN HFA) 108 (90 Base) MCG/ACT inhaler Inhale 2 puffs into the lungs every 6 (six) hours as needed for wheezing or shortness of breath. 18 g 5   albuterol (VENTOLIN HFA) 108 (90 Base) MCG/ACT  inhaler TAKE 2 PUFFS BY MOUTH EVERY 6 HOURS AS NEEDED FOR WHEEZE OR SHORTNESS OF BREATH 18 g 3   INCASSIA 0.35 MG tablet TAKE 1 TABLET BY MOUTH EVERY DAY 84 tablet 1   levofloxacin (LEVAQUIN) 500 MG tablet Take 1 tablet (500 mg total) by mouth daily. 7 tablet 0   metoprolol succinate (TOPROL-XL) 50 MG 24 hr tablet Take 1 tablet (50 mg total) by mouth daily. Take with or immediately following a meal. 90 tablet 3   mometasone (NASONEX) 50 MCG/ACT nasal spray PLACE 2 SPRAYS INTO THE NOSE DAILY. 51 each 4   ondansetron (ZOFRAN-ODT) 8 MG disintegrating tablet TAKE 1 TABLET BY MOUTH EVERY 8 HOURS AS NEEDED FOR NAUSEA AND VOMITING 20 tablet 1   pantoprazole (PROTONIX) 40 MG tablet TAKE 1 TABLET BY MOUTH EVERY DAY 90 tablet 3   traMADol (ULTRAM) 50 MG tablet TAKE 1 TABLET BY MOUTH EVERY 8 HOURS AS NEEDED. 30 tablet 0   HYDROcodone-acetaminophen (NORCO) 10-325 MG tablet Take 1 tablet by mouth every 6 (six) hours as needed. 30 tablet 0   meclizine (ANTIVERT) 25 MG tablet TAKE 1 TABLET BY MOUTH 3 TIMES DAILY AS NEEDED FOR DIZZINESS. 30 tablet 0   SUMAtriptan (IMITREX) 100 MG tablet Take 1 tablet (100 mg total) by mouth every 2 (two) hours as needed for migraine. May repeat in 2 hours if headache persists or recurs. 10 tablet 1   tiZANidine (ZANAFLEX) 4 MG tablet TAKE 1 TABLET BY MOUTH EVERY 6 HOURS AS NEEDED FOR MUSCLE SPASMS. 90 tablet 1   amphetamine-dextroamphetamine (ADDERALL) 30 MG tablet Take 1 tablet by mouth 2 (two) times daily. 180 tablet 0   predniSONE (DELTASONE) 10 MG tablet TAKE 3 TABLETS PO QD FOR 3 DAYS THEN TAKE 2 TABLETS PO QD FOR 3 DAYS THEN TAKE 1 TABLET PO QD FOR 3 DAYS THEN TAKE 1/2 TAB PO QD FOR 3 DAYS (Patient not taking: Reported on 03/24/2021) 20 tablet 0   No facility-administered medications prior to visit.    Allergies  Allergen Reactions   Ketorolac Tromethamine Hives   Morphine Other (See Comments)    "makes me the devil."   Latex Rash and Other (See Comments)    Latex  catheter caused extreme irritation    Review of Systems  Constitutional:  Negative for fever and malaise/fatigue.  HENT:  Negative for congestion.   Eyes:  Negative for blurred vision.  Respiratory:  Negative for shortness of breath.  Cardiovascular:  Negative for chest pain, palpitations and leg swelling.  Gastrointestinal:  Negative for abdominal pain, blood in stool and nausea.  Genitourinary:  Negative for dysuria and frequency.  Musculoskeletal:  Negative for falls.  Skin:  Negative for rash.  Neurological:  Negative for dizziness, loss of consciousness and headaches.  Endo/Heme/Allergies:  Negative for environmental allergies.  Psychiatric/Behavioral:  Negative for depression. The patient is not nervous/anxious.       Objective:    Physical Exam Constitutional:      General: She is not in acute distress.    Appearance: Normal appearance. She is not ill-appearing.  HENT:     Head: Normocephalic and atraumatic.     Right Ear: Tympanic membrane, ear canal and external ear normal.     Left Ear: Tympanic membrane, ear canal and external ear normal.  Eyes:     Extraocular Movements: Extraocular movements intact.     Pupils: Pupils are equal, round, and reactive to light.  Cardiovascular:     Rate and Rhythm: Normal rate and regular rhythm.     Heart sounds: Normal heart sounds. No murmur heard.   No gallop.  Pulmonary:     Effort: Pulmonary effort is normal. No respiratory distress.     Breath sounds: Normal breath sounds. No wheezing or rales.  Abdominal:     General: Bowel sounds are normal. There is no distension.     Palpations: Abdomen is soft. There is no mass.     Tenderness: There is no abdominal tenderness. There is no guarding or rebound.  Skin:    General: Skin is warm and dry.  Neurological:     Mental Status: She is alert and oriented to person, place, and time.  Psychiatric:        Behavior: Behavior normal.    BP 112/84 (BP Location: Right Arm,  Patient Position: Sitting, Cuff Size: Large)   Pulse 62   Temp 98.1 F (36.7 C) (Oral)   Resp 18   Ht 5\' 7"  (1.702 m)   Wt 269 lb (122 kg)   SpO2 99%   BMI 42.13 kg/m  Wt Readings from Last 3 Encounters:  03/24/21 269 lb (122 kg)  12/21/20 290 lb 6.4 oz (131.7 kg)  09/06/20 300 lb (136.1 kg)    Diabetic Foot Exam - Simple   No data filed    Lab Results  Component Value Date   WBC 4.6 12/21/2020   HGB 13.5 12/21/2020   HCT 38.9 12/21/2020   PLT 190.0 12/21/2020   GLUCOSE 90 12/21/2020   CHOL 140 12/21/2020   TRIG 59.0 12/21/2020   HDL 47.80 12/21/2020   LDLCALC 80 12/21/2020   ALT 15 12/21/2020   AST 17 12/21/2020   NA 139 12/21/2020   K 3.7 12/21/2020   CL 104 12/21/2020   CREATININE 0.79 12/21/2020   BUN 16 12/21/2020   CO2 27 12/21/2020   TSH 1.67 12/21/2020   HGBA1C 5.4 06/06/2013    Lab Results  Component Value Date   TSH 1.67 12/21/2020   Lab Results  Component Value Date   WBC 4.6 12/21/2020   HGB 13.5 12/21/2020   HCT 38.9 12/21/2020   MCV 83.2 12/21/2020   PLT 190.0 12/21/2020   Lab Results  Component Value Date   NA 139 12/21/2020   K 3.7 12/21/2020   CO2 27 12/21/2020   GLUCOSE 90 12/21/2020   BUN 16 12/21/2020   CREATININE 0.79 12/21/2020   BILITOT 0.7 12/21/2020  ALKPHOS 57 12/21/2020   AST 17 12/21/2020   ALT 15 12/21/2020   PROT 6.1 12/21/2020   ALBUMIN 4.0 12/21/2020   CALCIUM 9.0 12/21/2020   GFR 88.14 12/21/2020   Lab Results  Component Value Date   CHOL 140 12/21/2020   Lab Results  Component Value Date   HDL 47.80 12/21/2020   Lab Results  Component Value Date   LDLCALC 80 12/21/2020   Lab Results  Component Value Date   TRIG 59.0 12/21/2020   Lab Results  Component Value Date   CHOLHDL 3 12/21/2020   Lab Results  Component Value Date   HGBA1C 5.4 06/06/2013       Assessment & Plan:   Problem List Items Addressed This Visit       Unprioritized   Migraine   Relevant Medications   tiZANidine  (ZANAFLEX) 4 MG tablet   SUMAtriptan (IMITREX) 100 MG tablet   HYDROcodone-acetaminophen (NORCO) 10-325 MG tablet   Muscle spasm   Relevant Medications   tiZANidine (ZANAFLEX) 4 MG tablet   Fibromyalgia    Stable        Relevant Medications   tiZANidine (ZANAFLEX) 4 MG tablet   HYDROcodone-acetaminophen (NORCO) 10-325 MG tablet   Major depressive disorder, recurrent episode, in full remission (HCC)    stable      Morbid obesity with BMI of 40.0-44.9, adult (HCC)    con't weight loss       Primary hypertension    Cont with diet and weight loss Well controlled, no changes to meds. Encouraged heart healthy diet such as the DASH diet and exercise as tolerated.       Other Visit Diagnoses     High risk medication use    -  Primary   Relevant Orders   Drug Monitoring Panel [phone] , Urine   Dizziness       Relevant Medications   meclizine (ANTIVERT) 25 MG tablet        Meds ordered this encounter  Medications   tiZANidine (ZANAFLEX) 4 MG tablet    Sig: Take 1 tablet (4 mg total) by mouth every 6 (six) hours as needed for muscle spasms.    Dispense:  90 tablet    Refill:  1   SUMAtriptan (IMITREX) 100 MG tablet    Sig: Take 1 tablet (100 mg total) by mouth every 2 (two) hours as needed for migraine. May repeat in 2 hours if headache persists or recurs.    Dispense:  10 tablet    Refill:  1   meclizine (ANTIVERT) 25 MG tablet    Sig: TAKE 1 TABLET BY MOUTH 3 TIMES DAILY AS NEEDED FOR DIZZINESS.    Dispense:  30 tablet    Refill:  0   HYDROcodone-acetaminophen (NORCO) 10-325 MG tablet    Sig: Take 1 tablet by mouth every 6 (six) hours as needed.    Dispense:  30 tablet    Refill:  0    I, Dr. Seabron Spates, DO, personally preformed the services described in this documentation.  All medical record entries made by the scribe were at my direction and in my presence.  I have reviewed the chart and discharge instructions (if applicable) and agree that the record  reflects my personal performance and is accurate and complete. 02/21/2021   I,Jo Jackson,acting as a scribe for [name], DO.,have documented all relevant documentation on the behalf of [name], DO,as directed by  [name],  DO while in the presence of Ann Held, DO.   Ann Held, DO

## 2021-03-24 NOTE — Assessment & Plan Note (Signed)
con't weight loss

## 2021-03-27 LAB — DRUG MONITORING PANEL 376104, URINE
Amphetamine: 735 ng/mL — ABNORMAL HIGH (ref <250)
Amphetamines: POSITIVE ng/mL — AB (ref <500)
Barbiturates: NEGATIVE ng/mL (ref <300)
Benzodiazepines: NEGATIVE ng/mL (ref <100)
Cocaine Metabolite: NEGATIVE ng/mL (ref <150)
Codeine: NEGATIVE ng/mL (ref <50)
Desmethyltramadol: NEGATIVE ng/mL (ref <100)
Hydrocodone: NEGATIVE ng/mL (ref <50)
Hydromorphone: NEGATIVE ng/mL (ref <50)
Methamphetamine: NEGATIVE ng/mL (ref <250)
Morphine: NEGATIVE ng/mL (ref <50)
Norhydrocodone: 78 ng/mL — ABNORMAL HIGH (ref <50)
Opiates: POSITIVE ng/mL — AB (ref <100)
Oxycodone: NEGATIVE ng/mL (ref <100)
Tramadol: NEGATIVE ng/mL (ref <100)

## 2021-03-27 LAB — DM TEMPLATE

## 2021-04-01 ENCOUNTER — Encounter: Payer: Self-pay | Admitting: Family Medicine

## 2021-04-03 ENCOUNTER — Other Ambulatory Visit: Payer: Self-pay | Admitting: Family Medicine

## 2021-04-03 DIAGNOSIS — G43809 Other migraine, not intractable, without status migrainosus: Secondary | ICD-10-CM

## 2021-04-04 IMAGING — CT CT PARANASAL SINUSES LIMITED
1 series · 15 of 17 positions shown, 19 images · non-contrast
Comparison: None.

CLINICAL DATA: Maxillofacial pain, persistent sinus pressure

EXAM:
CT PARANASAL SINUS LIMITED WITHOUT CONTRAST
TECHNIQUE: Non-contiguous multidetector CT images of the paranasal sinuses were
obtained in a single plane without contrast.

[Series 3: limited sinus st · axial · 0.32mm/px · z∈[-185,-45]mm · 15 of 17 slices shown, 19 images]
[im 2/17  brain]
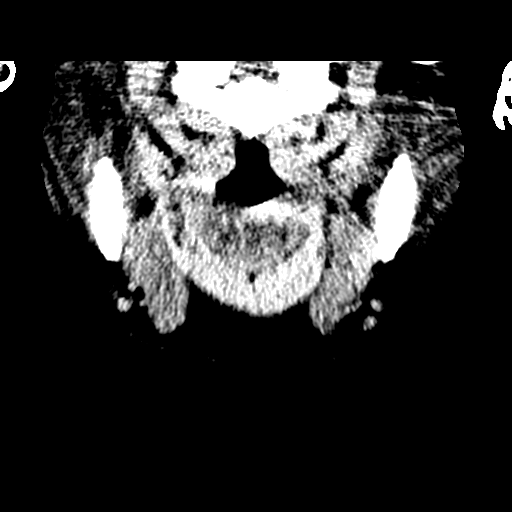
[im 2/17  bone]
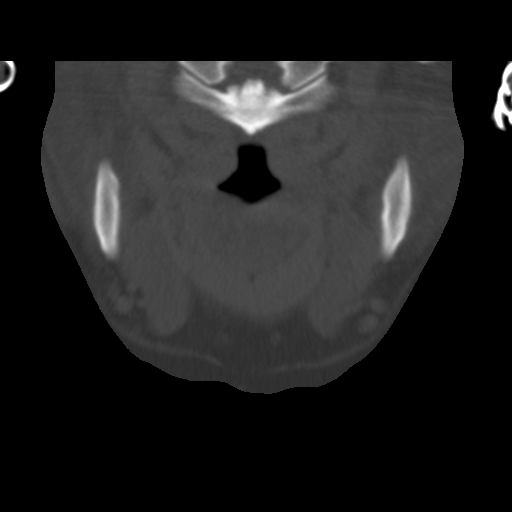
[im 3/17  bone]
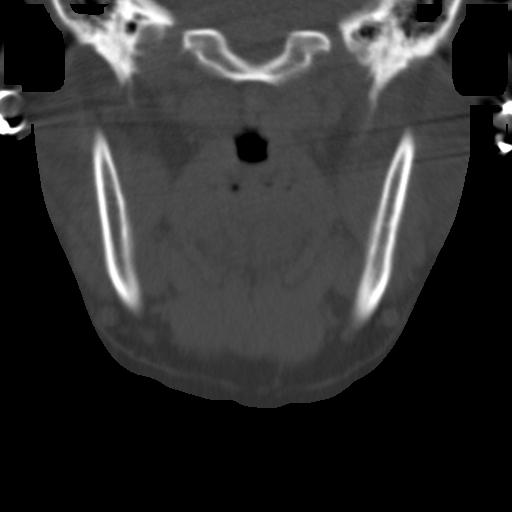
[im 4/17  bone]
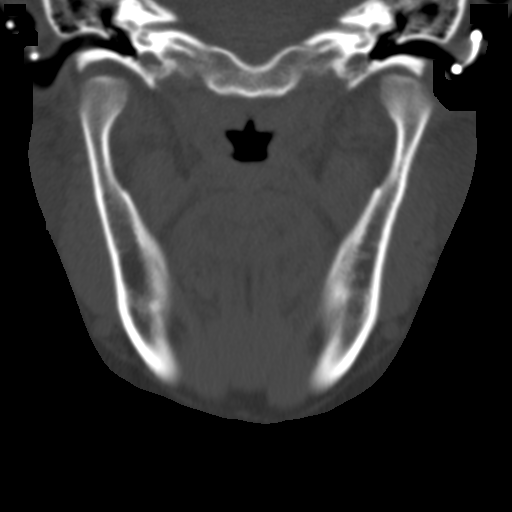
[im 5/17  bone]
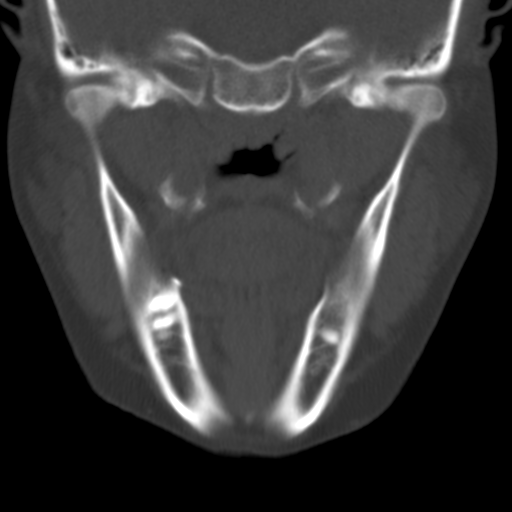
[im 6/17  brain]
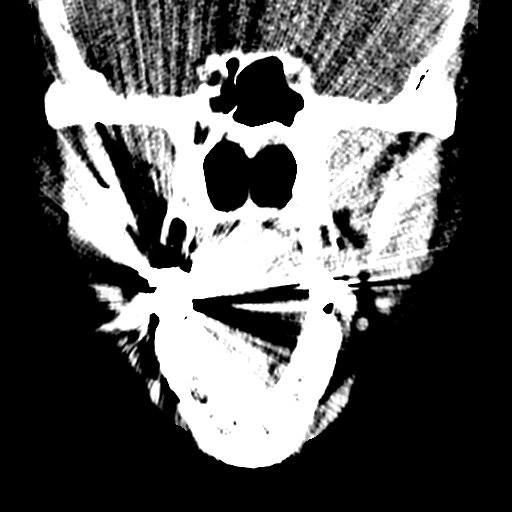
[im 6/17  bone]
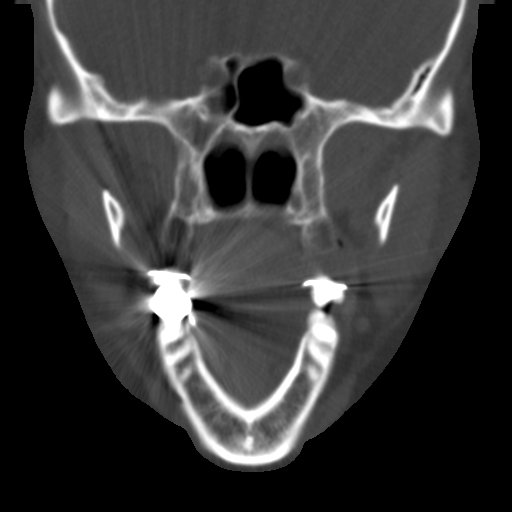
[im 7/17  bone]
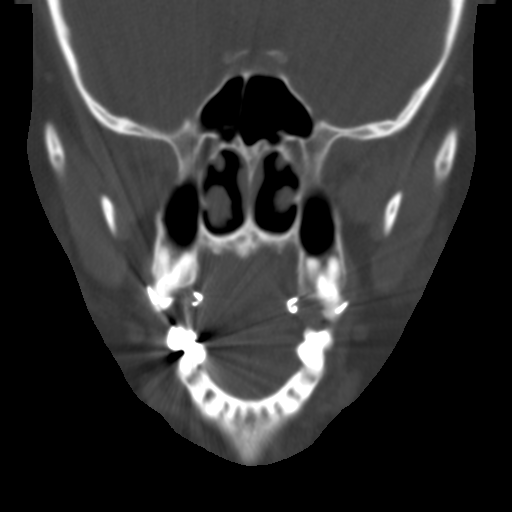
[im 8/17  bone]
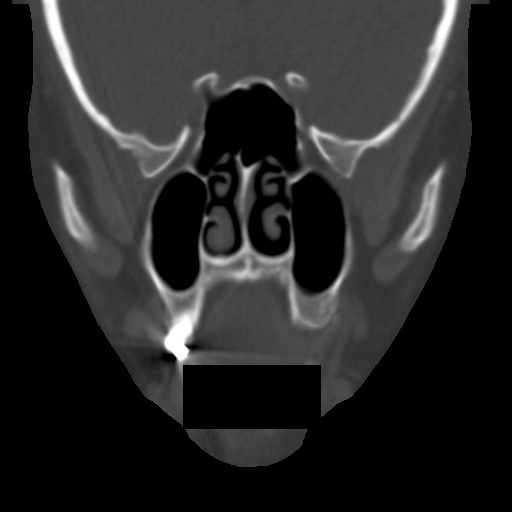
[im 9/17  bone]
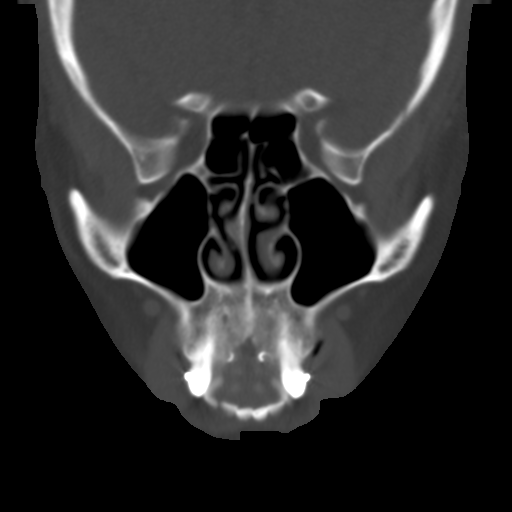
[im 10/17  brain]
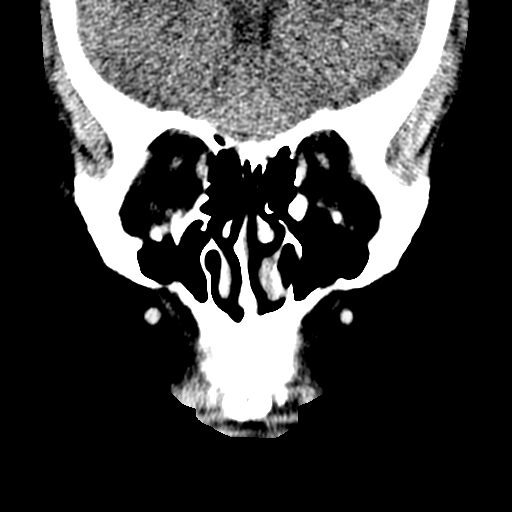
[im 10/17  bone]
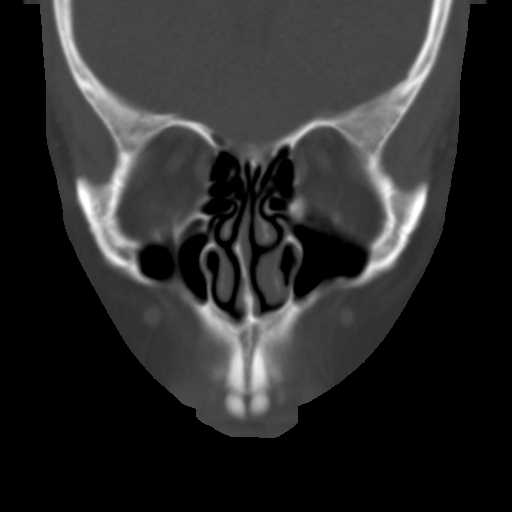
[im 11/17  bone]
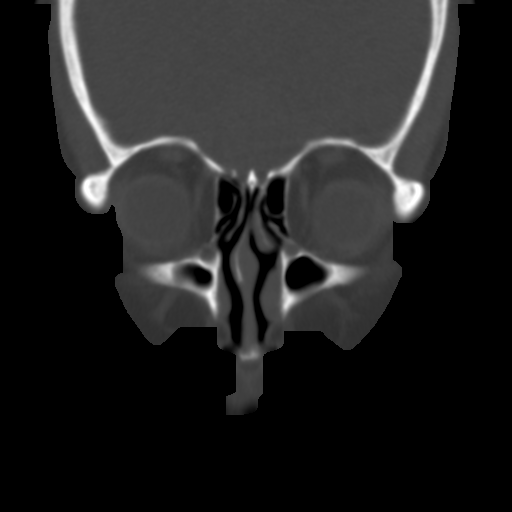
[im 12/17  bone]
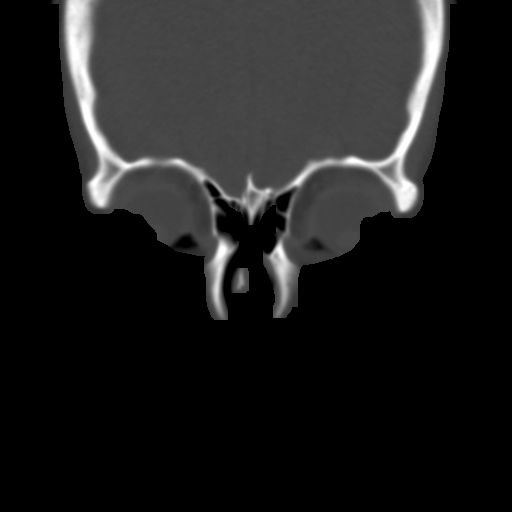
[im 13/17  bone]
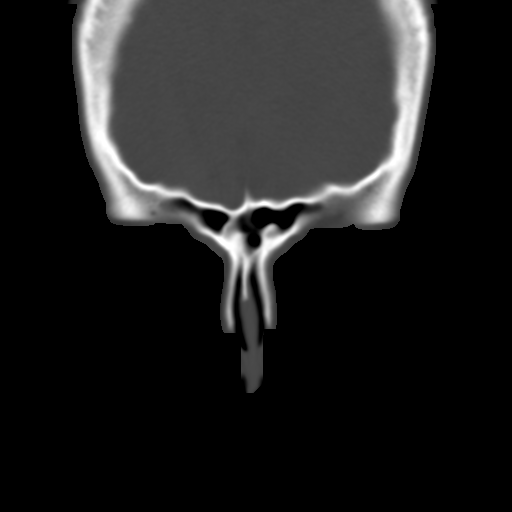
[im 14/17  brain]
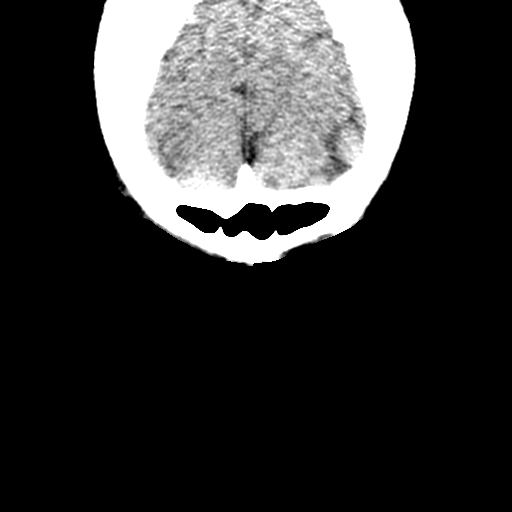
[im 14/17  bone]
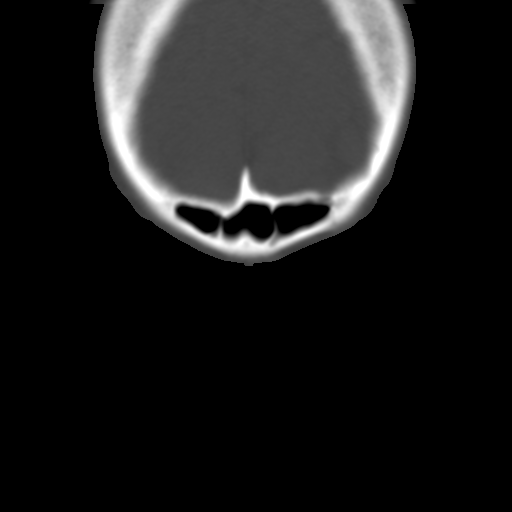
[im 15/17  bone]
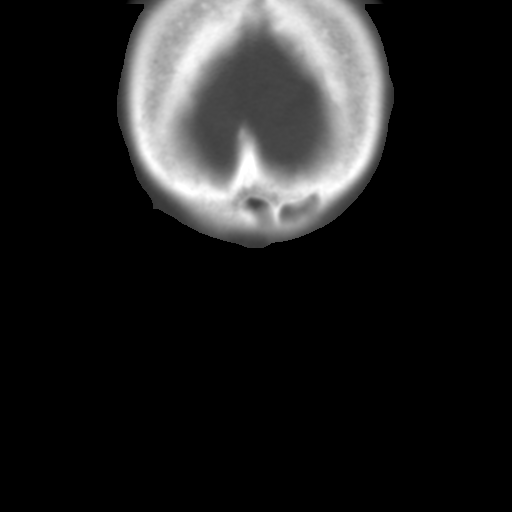
[im 16/17  bone]
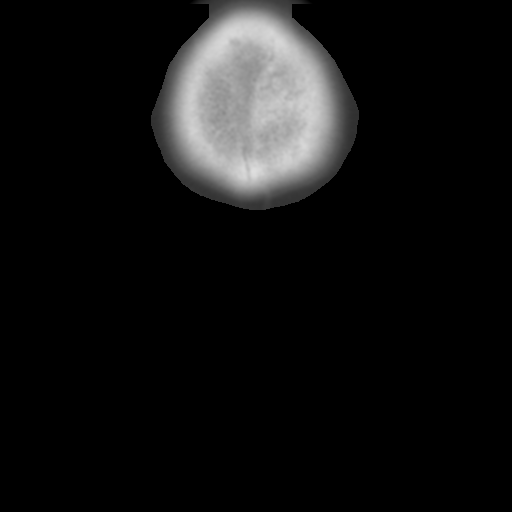

[15 of 17 positions shown; findings below may reference images not displayed]

FINDINGS: Mild rightward deviation of the nasal septum without spur.
Visualized portions of the paranasal sinuses are clear. No
significant nasal cavity opacification.
IMPRESSION: Visualized portions of paranasal sinuses are clear.

## 2021-04-08 ENCOUNTER — Encounter: Payer: Self-pay | Admitting: Family Medicine

## 2021-04-08 ENCOUNTER — Other Ambulatory Visit: Payer: Self-pay | Admitting: Family Medicine

## 2021-04-08 DIAGNOSIS — G43819 Other migraine, intractable, without status migrainosus: Secondary | ICD-10-CM

## 2021-04-08 DIAGNOSIS — G43809 Other migraine, not intractable, without status migrainosus: Secondary | ICD-10-CM

## 2021-04-08 MED ORDER — UBRELVY 50 MG PO TABS: ORAL_TABLET | ORAL | 2 refills | Status: DC

## 2021-04-10 ENCOUNTER — Other Ambulatory Visit: Payer: Self-pay | Admitting: Family Medicine

## 2021-04-10 DIAGNOSIS — R110 Nausea: Secondary | ICD-10-CM

## 2021-04-10 DIAGNOSIS — M62838 Other muscle spasm: Secondary | ICD-10-CM

## 2021-04-11 ENCOUNTER — Encounter: Payer: Self-pay | Admitting: Neurology

## 2021-04-15 ENCOUNTER — Ambulatory Visit (INDEPENDENT_AMBULATORY_CARE_PROVIDER_SITE_OTHER): Payer: BC Managed Care – PPO

## 2021-04-15 ENCOUNTER — Other Ambulatory Visit: Payer: Self-pay

## 2021-04-15 DIAGNOSIS — Z23 Encounter for immunization: Secondary | ICD-10-CM | POA: Diagnosis not present

## 2021-04-15 NOTE — Progress Notes (Addendum)
Pt is here today for flu vaccine. Pt was given flu vaccine in left deltoid. Pt tolerated well 

## 2021-05-04 ENCOUNTER — Other Ambulatory Visit: Payer: Self-pay | Admitting: Family Medicine

## 2021-05-04 DIAGNOSIS — G43809 Other migraine, not intractable, without status migrainosus: Secondary | ICD-10-CM

## 2021-05-04 DIAGNOSIS — M797 Fibromyalgia: Secondary | ICD-10-CM

## 2021-05-05 MED ORDER — HYDROCODONE-ACETAMINOPHEN 10-325 MG PO TABS: 1.0000 | ORAL_TABLET | Freq: Four times a day (QID) | ORAL | 0 refills | Status: DC | PRN

## 2021-05-05 MED ORDER — TRAMADOL HCL 50 MG PO TABS: 50.0000 mg | ORAL_TABLET | Freq: Three times a day (TID) | ORAL | 0 refills | Status: DC | PRN

## 2021-05-05 NOTE — Telephone Encounter (Signed)
Requesting: tramadol 50mg  and hydrocodone 10-325mg   Contract: 03/24/2021 UDS: 03/24/2021 Last Visit: 03/24/2021 Next Visit: 09/27/2021 Last Refill on hydrocodone: 03/24/2021 #30 and 0RF Last Refill on tramadol: 03/07/2021 #30 and 0RF  Please Advise

## 2021-05-14 ENCOUNTER — Other Ambulatory Visit: Payer: Self-pay | Admitting: Family Medicine

## 2021-05-14 DIAGNOSIS — M62838 Other muscle spasm: Secondary | ICD-10-CM

## 2021-05-17 ENCOUNTER — Other Ambulatory Visit: Payer: Self-pay | Admitting: Family Medicine

## 2021-05-17 DIAGNOSIS — M62838 Other muscle spasm: Secondary | ICD-10-CM

## 2021-05-17 DIAGNOSIS — R110 Nausea: Secondary | ICD-10-CM

## 2021-05-19 ENCOUNTER — Encounter: Payer: Self-pay | Admitting: Family Medicine

## 2021-05-19 ENCOUNTER — Telehealth: Payer: Self-pay | Admitting: Family Medicine

## 2021-05-19 DIAGNOSIS — R0602 Shortness of breath: Secondary | ICD-10-CM

## 2021-05-19 NOTE — Telephone Encounter (Signed)
Spoke with patient. Pt states she can't go to the ED because she doesn't have anyone to pick up her kids. I asked patient if she could go after picking up her kids and she stated her kids had therapy. I asked her if she could go to a UC and pt states she was seen at [facility] a few days ago and was Dx with a respiratory illness. She states she wasn't given a antibiotic. She states the UC advised her she couldn't be seen again because she didn't finish antibiotics before. Pt was advised to seek emergency help if sxs worsened. Pt scheduled with [facility] on Monday.

## 2021-05-19 NOTE — Telephone Encounter (Signed)
Pt called regarding breathing difficulty. Pt was recently seen at Urgent Care and was diagnosed with an upper respiratory virus. Advised pt additional medical attention may be needed.Transferred to Triage.

## 2021-05-19 NOTE — Telephone Encounter (Signed)
Pt was disconnected from triage earlier, so was triaged again.

## 2021-05-19 NOTE — Telephone Encounter (Signed)
Nurse Assessment Nurse: Freida Busman, RN, Jonette Eva Date/Time Jo Jackson Time): 05/19/2021 11:32:19 AM Confirm and document reason for call. If symptomatic, describe symptoms. ---Caller states she was dx with resp illness day before yesterday and has taken prednisone as prescribed states she is extremely short of breath and fatigued short of breath while sitting still no fever at this time feels flushed Does the patient have any new or worsening symptoms? ---Yes Will a triage be completed? ---Yes Related visit to physician within the last 2 weeks? ---Yes Does the PT have any chronic conditions? (i.e. diabetes, asthma, this includes High risk factors for pregnancy, etc.) ---Yes List chronic conditions. ---copd, htn, Is the patient pregnant or possibly pregnant? (Ask all females between the ages of 61-55) ---No Is this a behavioral health or substance abuse call? ---No Guidelines Guideline Title Affirmed Question Affirmed Notes Nurse Date/Time (Eastern Time) Asthma Attack [1] Continuous (nonstop) coughing AND [2] keeps from working or sleeping AND [3] not improved after 2 or 3 inhaler or nebulizer Freida Busman, RN, Jonette Eva 05/19/2021 11:34:49 AM PLEASE NOTE: All timestamps contained within this report are represented as [place] Standard Time. CONFIDENTIALTY NOTICE: This fax transmission is intended only for the addressee. It contains information that is legally privileged, confidential or otherwise protected from use or disclosure. If you are not the intended recipient, you are strictly prohibited from reviewing, disclosing, copying using or disseminating any of this information or taking any action in reliance on or regarding this information. If you have received this fax in error, please notify us immediately by telephone so that we can arrange for its return to [place]. Phone: [phone], Toll-Free: [phone], Fax: [phone] Page: 2 of 2 Call Id: [id number] Guidelines Guideline Title Affirmed  Question Affirmed Notes Nurse Date/Time Jo Jackson Time) treatments given 20 minutes apart Disp. Time Jo Jackson Time) Disposition Final User 05/19/2021 11:30:51 AM Send to Urgent Tenna Child 05/19/2021 11:41:38 AM See HCP within 4 Hours (or PCP triage) Yes Freida Busman, RN, Jonette Eva Caller Disagree/Comply Comply Caller Understands Yes PreDisposition Call Doctor Care Advice Given Per Guideline SEE HCP (OR PCP TRIAGE) WITHIN 4 HOURS: * IF OFFICE WILL BE OPEN: You need to be seen within the next 3 or 4 hours. Call your doctor (or NP/PA) now or as soon as the office opens. CALL BACK IF: * You become worse CARE ADVICE given per Asthma Attack (Adult) guideline. BRING MEDICINES: * Please bring a list of your current medicines when you go to see the doctor. * It is also a good idea to bring the pill bottles too. This will help the doctor to make certain you are taking the right medicines and the right dose. * ED: Patients who may need surgery or hospital admission need to be sent to an ED. So do most patients with serious symptoms or complex medical problems. ASTHMA ATTACK - TREATMENT - QUICK-RELIEF MEDICINE: * If you have not used your inhaler or nebulizer in the PAST 20 MINUTES, take 2 to 4 puffs of your quick-relief ALBUTEROL (SALBUTAMOL) INHALER right now. Comments User: Felipe Drone, RN Date/Time Jo Jackson Time): 05/19/2021 11:42:46 AM no appt available at pcp office Referrals [facility] - ED

## 2021-05-23 ENCOUNTER — Ambulatory Visit: Payer: BC Managed Care – PPO | Admitting: Medical

## 2021-05-24 ENCOUNTER — Encounter: Payer: Self-pay | Admitting: Family Medicine

## 2021-05-24 ENCOUNTER — Other Ambulatory Visit: Payer: Self-pay

## 2021-05-24 ENCOUNTER — Ambulatory Visit (INDEPENDENT_AMBULATORY_CARE_PROVIDER_SITE_OTHER): Payer: BC Managed Care – PPO | Admitting: Family Medicine

## 2021-05-24 VITALS — BP 157/98 | HR 86 | Temp 98.1°F | Resp 12 | Ht 67.0 in | Wt 283.2 lb

## 2021-05-24 DIAGNOSIS — J40 Bronchitis, not specified as acute or chronic: Secondary | ICD-10-CM

## 2021-05-24 MED ORDER — AZITHROMYCIN 250 MG PO TABS: ORAL_TABLET | ORAL | 0 refills | Status: DC

## 2021-05-24 MED ORDER — ALBUTEROL SULFATE (2.5 MG/3ML) 0.083% IN NEBU: 2.5000 mg | INHALATION_SOLUTION | Freq: Four times a day (QID) | RESPIRATORY_TRACT | 12 refills | Status: DC | PRN

## 2021-05-24 MED ORDER — PREDNISONE 10 MG PO TABS
ORAL_TABLET | ORAL | 0 refills | Status: DC
Start: 2021-05-24 — End: 2021-05-24

## 2021-05-24 MED ORDER — PREDNISONE 10 MG PO TABS: ORAL_TABLET | ORAL | 0 refills | Status: DC

## 2021-05-24 MED ORDER — METHYLPREDNISOLONE ACETATE 80 MG/ML IJ SUSP
80.0000 mg | Freq: Once | INTRAMUSCULAR | Status: AC
  Administered 2021-05-24: 80 mg via INTRAMUSCULAR

## 2021-05-24 NOTE — Progress Notes (Signed)
Subjective:   By signing my name below, I, [name], attest that this documentation has been prepared under the direction and in the presence of Dr. Seabron Spates, DO. 05/24/2021    Patient ID: Jo Jackson, female    DOB: Jan 18, 1971, 50 y.o.   MRN: [id number]  Chief Complaint  Patient presents with   Cough    Cough Associated symptoms include shortness of breath and wheezing. Pertinent negatives include no chest pain, chills, fever, headaches, heartburn, myalgias or rash. There is no history of environmental allergies.  Patient is in today for a office visit.   Upper respiratory infection- She complains her upper respiratory infection has not improved. Her cough is keeping her up at night. She has went to an urgent care and was given promethazine and finds mild relief. She tested negative for Covid-19, strept throat, and flu at the urgent care. She reports getting a new nebulizer machine. She is requesting a refill on albuterol. Blood pressure- Her blood pressure is elevated during this visit. She reports not taking her blood pressure medication this morning.    Past Medical History:  Diagnosis Date   Abnormal Pap smear    Repeats WNL   ADD (attention deficit disorder)    Anxiety    Arthritis    Oseteoarthritis   Depression    GERD (gastroesophageal reflux disease)    Headache(784.0)    Migraines   Junctional escape rhythm    Neuromuscular disorder (HCC)    Fibromyalgia   Pregnancy induced hypertension    Supraventricular tachycardia (HCC)     Past Surgical History:  Procedure Laterality Date   ABLATION OF DYSRHYTHMIC FOCUS     APPENDECTOMY  1986   CARPAL TUNNEL RELEASE  2000   CERVICAL FUSION  08-24-99,04-26-00,05-09-01   Knee sugery Left    16 yrs. old   SHOULDER SURGERY  left-6-04, right 09-12-04   tongue polyp  1984    Family History  Problem Relation Age of Onset   Hodgkin's lymphoma Father    COPD Mother    Heart disease Mother        chf    Depression Mother    Cancer Paternal Aunt        breast   Diabetes Other    Thyroid disease Other    Hypertension Other    Depression Other    Bipolar disorder Other    ADD / ADHD Brother     Social History   Socioeconomic History   Marital status: Widowed    Spouse name: Not on file   Number of children: 2   Years of education: Not on file   Highest education level: Not on file  Occupational History    Employer: [employer]    Comment: P/T   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    Employer: [employer]  Tobacco Use   Smoking status: Former    Types: Cigarettes    Quit date: 10/23/2007    Years since quitting: 13.5   Smokeless tobacco: Never  Vaping Use   Vaping Use: Never used  Substance and Sexual Activity   Alcohol use: Yes    Alcohol/week: 0.0 standard drinks    Comment: Occ   Drug use: No   Sexual activity: Yes  Other Topics Concern   Not on file  Social History Narrative   G1p1      Exercise- no   Social Determinants of Health   Financial Resource Strain: Not on file  Food Insecurity: Not on  file  Transportation Needs: Not on file  Physical Activity: Not on file  Stress: Not on file  Social Connections: Not on file  Intimate Partner Violence: Not on file    Outpatient Medications Prior to Visit  Medication Sig Dispense Refill   albuterol (VENTOLIN HFA) 108 (90 Base) MCG/ACT inhaler Inhale 2 puffs into the lungs every 6 (six) hours as needed for wheezing or shortness of breath. 18 g 5   albuterol (VENTOLIN HFA) 108 (90 Base) MCG/ACT inhaler TAKE 2 PUFFS BY MOUTH EVERY 6 HOURS AS NEEDED FOR WHEEZE OR SHORTNESS OF BREATH 18 g 3   amphetamine-dextroamphetamine (ADDERALL) 30 MG tablet Take 1 tablet by mouth 2 (two) times daily. 180 tablet 0   HYDROcodone-acetaminophen (NORCO) 10-325 MG tablet Take 1 tablet by mouth every 6 (six) hours as needed. 30 tablet 0   INCASSIA 0.35 MG tablet TAKE 1 TABLET BY MOUTH EVERY DAY 84 tablet 1   levofloxacin (LEVAQUIN) 500 MG  tablet Take 1 tablet (500 mg total) by mouth daily. 7 tablet 0   meclizine (ANTIVERT) 25 MG tablet TAKE 1 TABLET BY MOUTH 3 TIMES DAILY AS NEEDED FOR DIZZINESS. 30 tablet 0   metoprolol succinate (TOPROL-XL) 50 MG 24 hr tablet Take 1 tablet (50 mg total) by mouth daily. Take with or immediately following a meal. 90 tablet 3   mometasone (NASONEX) 50 MCG/ACT nasal spray PLACE 2 SPRAYS INTO THE NOSE DAILY. 51 each 4   ondansetron (ZOFRAN-ODT) 8 MG disintegrating tablet TAKE 1 TABLET BY MOUTH EVERY 8 HOURS AS NEEDED FOR NAUSEA AND VOMITING 20 tablet 1   pantoprazole (PROTONIX) 40 MG tablet TAKE 1 TABLET BY MOUTH EVERY DAY 90 tablet 3   tiZANidine (ZANAFLEX) 4 MG tablet Take 1 tablet (4 mg total) by mouth every 6 (six) hours as needed for muscle spasms. 90 tablet 1   traMADol (ULTRAM) 50 MG tablet Take 1 tablet (50 mg total) by mouth every 8 (eight) hours as needed. 30 tablet 0   Ubrogepant (UBRELVY) 50 MG TABS 1 po x1, may repeat after 2 hours 30 tablet 2   No facility-administered medications prior to visit.    Allergies  Allergen Reactions   Ketorolac Tromethamine Hives   Morphine Other (See Comments)    "makes me the devil."   Latex Rash and Other (See Comments)    Latex catheter caused extreme irritation    Review of Systems  Constitutional:  Negative for chills, fever and malaise/fatigue.  HENT:  Negative for congestion and hearing loss.   Eyes:  Negative for discharge.  Respiratory:  Positive for cough, sputum production, shortness of breath and wheezing.   Cardiovascular:  Negative for chest pain, palpitations and leg swelling.  Gastrointestinal:  Negative for abdominal pain, blood in stool, constipation, diarrhea, heartburn, nausea and vomiting.  Genitourinary:  Negative for dysuria, frequency, hematuria and urgency.  Musculoskeletal:  Negative for back pain, falls and myalgias.  Skin:  Negative for rash.  Neurological:  Negative for dizziness, sensory change, loss of  consciousness, weakness and headaches.  Endo/Heme/Allergies:  Negative for environmental allergies. Does not bruise/bleed easily.  Psychiatric/Behavioral:  Negative for depression and suicidal ideas. The patient is not nervous/anxious and does not have insomnia.       Objective:    Physical Exam Vitals and nursing note reviewed.  Constitutional:      General: She is not in acute distress.    Appearance: Normal appearance. She is not ill-appearing.  HENT:     Head:  Normocephalic and atraumatic.     Right Ear: External ear normal.     Left Ear: External ear normal.  Eyes:     Extraocular Movements: Extraocular movements intact.     Pupils: Pupils are equal, round, and reactive to light.  Cardiovascular:     Rate and Rhythm: Normal rate and regular rhythm.     Heart sounds: Normal heart sounds. No murmur heard.   No gallop.  Pulmonary:     Effort: Pulmonary effort is normal. No respiratory distress.     Breath sounds: Wheezing and rales present.     Comments: Wheeze in the front and back Skin:    General: Skin is warm and dry.  Neurological:     Mental Status: She is alert and oriented to person, place, and time.  Psychiatric:        Behavior: Behavior normal.        Judgment: Judgment normal.    BP (!) 157/98 (BP Location: Right Arm, Cuff Size: Large)   Pulse 86   Temp 98.1 F (36.7 C) (Oral)   Resp 12   Ht 5\' 7"  (1.702 m)   Wt 283 lb 3.2 oz (128.5 kg)   LMP 05/20/2021   SpO2 100%   BMI 44.36 kg/m  Wt Readings from Last 3 Encounters:  05/24/21 283 lb 3.2 oz (128.5 kg)  03/24/21 269 lb (122 kg)  12/21/20 290 lb 6.4 oz (131.7 kg)    Diabetic Foot Exam - Simple   No data filed    Lab Results  Component Value Date   WBC 4.6 12/21/2020   HGB 13.5 12/21/2020   HCT 38.9 12/21/2020   PLT 190.0 12/21/2020   GLUCOSE 90 12/21/2020   CHOL 140 12/21/2020   TRIG 59.0 12/21/2020   HDL 47.80 12/21/2020   LDLCALC 80 12/21/2020   ALT 15 12/21/2020   AST 17 12/21/2020    NA 139 12/21/2020   K 3.7 12/21/2020   CL 104 12/21/2020   CREATININE 0.79 12/21/2020   BUN 16 12/21/2020   CO2 27 12/21/2020   TSH 1.67 12/21/2020   HGBA1C 5.4 06/06/2013    Lab Results  Component Value Date   TSH 1.67 12/21/2020   Lab Results  Component Value Date   WBC 4.6 12/21/2020   HGB 13.5 12/21/2020   HCT 38.9 12/21/2020   MCV 83.2 12/21/2020   PLT 190.0 12/21/2020   Lab Results  Component Value Date   NA 139 12/21/2020   K 3.7 12/21/2020   CO2 27 12/21/2020   GLUCOSE 90 12/21/2020   BUN 16 12/21/2020   CREATININE 0.79 12/21/2020   BILITOT 0.7 12/21/2020   ALKPHOS 57 12/21/2020   AST 17 12/21/2020   ALT 15 12/21/2020   PROT 6.1 12/21/2020   ALBUMIN 4.0 12/21/2020   CALCIUM 9.0 12/21/2020   GFR 88.14 12/21/2020   Lab Results  Component Value Date   CHOL 140 12/21/2020   Lab Results  Component Value Date   HDL 47.80 12/21/2020   Lab Results  Component Value Date   LDLCALC 80 12/21/2020   Lab Results  Component Value Date   TRIG 59.0 12/21/2020   Lab Results  Component Value Date   CHOLHDL 3 12/21/2020   Lab Results  Component Value Date   HGBA1C 5.4 06/06/2013       Assessment & Plan:   Problem List Items Addressed This Visit   None Visit Diagnoses     Bronchitis    -  Primary   Relevant Medications   predniSONE (DELTASONE) 10 MG tablet   azithromycin (ZITHROMAX Z-PAK) 250 MG tablet   albuterol (PROVENTIL) (2.5 MG/3ML) 0.083% nebulizer solution        Meds ordered this encounter  Medications   DISCONTD: predniSONE (DELTASONE) 10 MG tablet    Sig: TAKE 3 TABLETS PO QD FOR 3 DAYS THEN TAKE 2 TABLETS PO QD FOR 3 DAYS THEN TAKE 1 TABLET PO QD FOR 3 DAYS THEN TAKE 1/2 TAB PO QD FOR 3 DAYS    Dispense:  20 tablet    Refill:  0   predniSONE (DELTASONE) 10 MG tablet    Sig: TAKE 3 TABLETS PO QD FOR 3 DAYS THEN TAKE 2 TABLETS PO QD FOR 3 DAYS THEN TAKE 1 TABLET PO QD FOR 3 DAYS THEN TAKE 1/2 TAB PO QD FOR 3 DAYS    Dispense:   20 tablet    Refill:  0   azithromycin (ZITHROMAX Z-PAK) 250 MG tablet    Sig: As directed    Dispense:  6 each    Refill:  0   albuterol (PROVENTIL) (2.5 MG/3ML) 0.083% nebulizer solution    Sig: Take 3 mLs (2.5 mg total) by nebulization every 6 (six) hours as needed for wheezing or shortness of breath.    Dispense:  75 mL    Refill:  12    I, Dr. Roma Schanz, DO, personally preformed the services described in this documentation.  All medical record entries made by the scribe were at my direction and in my presence.  I have reviewed the chart and discharge instructions (if applicable) and agree that the record reflects my personal performance and is accurate and complete. 05/24/2021   I,[name],acting as a scribe for Ann Held, DO.,have documented all relevant documentation on the behalf of Ann Held, DO,as directed by  Ann Held, DO while in the presence of Ann Held, DO.   Ann Held, DO

## 2021-05-24 NOTE — Patient Instructions (Signed)
Acute Bronchitis, Adult  Acute bronchitis is sudden or acute swelling of the air tubes (bronchi) in the lungs. Acute bronchitis causes these tubes to fill with mucus, whichcan make it hard to breathe. It can also cause coughing or wheezing. In adults, acute bronchitis usually goes away within 2 weeks. A cough caused by bronchitis may last up to 3 weeks. Smoking, allergies, and asthma can make thecondition worse. What are the causes? This condition can be caused by germs and by substances that irritate the lungs, including: Cold and flu viruses. The most common cause of this condition is the virus that causes the common cold. Bacteria. Substances that irritate the lungs, including: Smoke from cigarettes and other forms of tobacco. Dust and pollen. Fumes from chemical products, gases, or burned fuel. Other materials that pollute indoor or outdoor air. Close contact with someone who has acute bronchitis. What increases the risk? The following factors may make you more likely to develop this condition: A weak body's defense system, also called the immune system. A condition that affects your lungs and breathing, such as asthma. What are the signs or symptoms? Common symptoms of this condition include: Lung and breathing problems, such as: Coughing. This may bring up clear, yellow, or green mucus from your lungs (sputum). Wheezing. Having too much mucus in your lungs (chest congestion). Having shortness of breath. A fever. Chills. Aches and pains, including: Tightness in your chest and other body aches. A sore throat. How is this diagnosed? This condition is usually diagnosed based on: Your symptoms and medical history. A physical exam. You may also have other tests, including tests to rule out other conditions, such as pneumonia. These tests include: A test of lung function. Test of a mucus sample to look for the presence of bacteria. Tests to check the oxygen level in your  blood. Blood tests. Chest X-ray. How is this treated? Most cases of acute bronchitis clear up over time without treatment. Your health care provider may recommend: Drinking more fluids. This can thin your mucus, which may improve your breathing. Using a device that gets medicine into your lungs (inhaler) to help improve breathing and control coughing. Using a vaporizer or a humidifier. These are machines that add water to the air to help you breathe better. Taking a medicine for a fever. Taking a medicine that thins mucus and clears congestion (expectorant). Taking a medicine that prevents or stops coughing (cough suppressant). Follow these instructions at home: Activity Get plenty of rest. Return to your normal activities as told by your health care provider. Ask your health care provider what activities are safe for you. Lifestyle  Drink enough fluid to keep your urine pale yellow. Do not drink alcohol. Do not use any products that contain nicotine or tobacco, such as cigarettes, e-cigarettes, and chewing tobacco. If you need help quitting, ask your health care provider. Be aware that: Your bronchitis will get worse if you smoke or breathe in other people's smoke (secondhand smoke). Your lungs will heal faster if you quit smoking.  General instructions Take over-the-counter and prescription medicines only as told by your health care provider. Use an inhaler, vaporizer, or humidifier as told by your health care provider. If you have a sore throat, gargle with a salt-water mixture 3-4 times a day or as needed. To make a salt-water mixture, completely dissolve -1 tsp (3-6 g) of salt in 1 cup (237 mL) of warm water. Take two teaspoons of honey at bedtime to lessen coughing at night.   Keep all follow-up visits as told by your health care provider. This is important. How is this prevented? To lower your risk of getting this condition again: Wash your hands often with soap and water. If  soap and water are not available, use hand sanitizer. Avoid contact with people who have cold symptoms. Try not to touch your mouth, nose, or eyes with your hands. Avoid places where there are fumes from chemicals. Breathing these fumes will make your condition worse. Get the flu shot every year. Contact a health care provider if: Your symptoms do not improve after 2 weeks of treatment. You vomit more than once or twice. You have symptoms of dehydration such as: Dark urine. Dry skin or eyes. Increased thirst. Headaches. Confusion. Muscle cramps. Get help right away if you: Cough up blood. Feel pain in your chest. Have severe shortness of breath. Faint or keep feeling like you are going to faint. Have a severe headache. Have fever or chills that get worse. These symptoms may represent a serious problem that is an emergency. Do not wait to see if the symptoms will go away. Get medical help right away. Call your local emergency services (911 in the [place] Do not drive yourself to the hospital. Summary Acute bronchitis is sudden (acute) inflammation of the air tubes (bronchi) between the windpipe and the lungs. In adults, acute bronchitis usually goes away within 2 weeks, although coughing may last 3 weeks or longer. Take over-the-counter and prescription medicines only as told by your health care provider. Drink enough fluid to keep your urine pale yellow. Contact a health care provider if your symptoms do not improve after 2 weeks of treatment. Get help right away if you cough up blood, faint, or have chest pain or shortness of breath. This information is not intended to replace advice given to you by your health care provider. Make sure you discuss any questions you have with your healthcare provider. Document Revised: 06/09/2020 Document Reviewed: 01/31/2019 Elsevier Patient Education  2022 [employer]  

## 2021-05-24 NOTE — Assessment & Plan Note (Signed)
Depo medrol 80 mg im  pred taper  z pak 'and con't cough med prn  rto prn

## 2021-05-24 NOTE — Addendum Note (Signed)
Addended by: Thelma Barge D on: 05/24/2021 05:29 PM   Modules accepted: Orders

## 2021-05-25 ENCOUNTER — Other Ambulatory Visit (HOSPITAL_BASED_OUTPATIENT_CLINIC_OR_DEPARTMENT_OTHER): Payer: Self-pay | Admitting: Family Medicine

## 2021-05-25 DIAGNOSIS — Z1231 Encounter for screening mammogram for malignant neoplasm of breast: Secondary | ICD-10-CM

## 2021-05-29 ENCOUNTER — Encounter: Payer: Self-pay | Admitting: Family Medicine

## 2021-05-31 MED ORDER — AMOXICILLIN-POT CLAVULANATE 875-125 MG PO TABS: 1.0000 | ORAL_TABLET | Freq: Two times a day (BID) | ORAL | 0 refills | Status: DC

## 2021-05-31 MED ORDER — BENZONATATE 200 MG PO CAPS: 200.0000 mg | ORAL_CAPSULE | Freq: Three times a day (TID) | ORAL | 0 refills | Status: DC | PRN

## 2021-06-01 ENCOUNTER — Other Ambulatory Visit: Payer: Self-pay

## 2021-06-01 ENCOUNTER — Encounter: Payer: Self-pay | Admitting: Family Medicine

## 2021-06-01 ENCOUNTER — Encounter: Payer: Self-pay | Admitting: Physician Assistant

## 2021-06-01 ENCOUNTER — Other Ambulatory Visit (INDEPENDENT_AMBULATORY_CARE_PROVIDER_SITE_OTHER): Payer: BC Managed Care – PPO

## 2021-06-01 ENCOUNTER — Telehealth (INDEPENDENT_AMBULATORY_CARE_PROVIDER_SITE_OTHER): Payer: BC Managed Care – PPO | Admitting: Physician Assistant

## 2021-06-01 VITALS — BP 130/86 | HR 92 | Temp 97.8°F | Ht 67.0 in | Wt 282.0 lb

## 2021-06-01 DIAGNOSIS — R197 Diarrhea, unspecified: Secondary | ICD-10-CM

## 2021-06-01 DIAGNOSIS — R6889 Other general symptoms and signs: Secondary | ICD-10-CM

## 2021-06-01 LAB — POCT INFLUENZA A/B
Influenza A, POC: NEGATIVE
Influenza B, POC: NEGATIVE

## 2021-06-01 NOTE — Progress Notes (Signed)
Virtual Visit via Video   I connected with Jo Jackson on 06/01/21 at 11:30 AM EST by a video enabled telemedicine application and verified that I am speaking with the correct person using two identifiers. Location patient: Home Location provider:  [facility], Office Persons participating in the virtual visit: Jo Jackson, [place] PA-C, Anselmo Pickler, LPN   I discussed the limitations of evaluation and management by telemedicine and the availability of in person appointments. The patient expressed understanding and agreed to proceed.  I acted as a Education administrator for [employer], [employer], LPN   Subjective:   HPI:   Diarrhea Pt c/o diarrhea x 5 days. She recently took Azithromycin for URI. Diarrhea is the worst that its ever been in her life -- daughter also has similar diarrhea. Pt has used a whole bottle of Pepto-bismol.   She notified her PCP 3 days ago that she was concerned that she was developing sinusitis so she was also prescribed augmentin 3 days ago. She started this yesterday.  Denies: significant abdominal pain, fever, chills, inability to drink fluids, decreased UOP, bloody stools, dizziness, lightheadedness, pre-syncope, CP, palpatitions  ROS: See pertinent positives and negatives per HPI.  Patient Active Problem List   Diagnosis Date Noted   Migraine 12/21/2020   Muscle spasm 12/21/2020   Nausea 12/21/2020   Primary hypertension 12/03/2020   Dizzy 08/18/2020   Adult ADHD 06/04/2019   Social anxiety disorder 06/04/2019   Influenza 04/15/2018   Recurrent acute serous otitis media of right ear 03/22/2018   Vaginal irritation 03/22/2018   Pansinusitis 10/12/2017   Viral upper respiratory tract infection 05/28/2017   Skin tag of perianal region 11/20/2016   Rectal bleeding 08/20/2015   Bronchitis 06/04/2015   Sensation of fullness in right ear 04/07/2015   Cervicogenic headache 11/06/2014   Obesity (BMI 30-39.9) 06/24/2013    Morbid obesity with BMI of 40.0-44.9, adult ([place]) 06/07/2013   SVD (spontaneous vaginal delivery) 12/15/2012    Class: Status post   HAIR LOSS 08/11/2010   FATIGUE 08/11/2010   SINUSITIS - ACUTE-NOS 06/29/2010   GERD 06/29/2010   PAIN IN THORACIC SPINE 05/12/2010   Migraine without aura 02/10/2010   ELEVATED BLOOD PRESSURE WITHOUT DIAGNOSIS OF HYPERTENSION 02/10/2010   AMENORRHEA 04/26/2009   GAD (generalized anxiety disorder) 03/26/2009   Major depressive disorder, recurrent episode, in full remission ([place]) 03/26/2009   ALLERGIC REACTION 08/26/2008   Fibromyalgia 03/05/2007   ARTHROSCOPY, LEFT KNEE, HX OF 03/05/2007   Headache 01/31/2007   HX, PERSONAL, TOBACCO USE 01/31/2007    Social History   Tobacco Use   Smoking status: Former    Types: Cigarettes    Quit date: 10/23/2007    Years since quitting: 13.6   Smokeless tobacco: Never  Substance Use Topics   Alcohol use: Yes    Alcohol/week: 0.0 standard drinks    Comment: Occ    Current Outpatient Medications:    albuterol (PROVENTIL) (2.5 MG/3ML) 0.083% nebulizer solution, Take 3 mLs (2.5 mg total) by nebulization every 6 (six) hours as needed for wheezing or shortness of breath., Disp: 75 mL, Rfl: 12   albuterol (VENTOLIN HFA) 108 (90 Base) MCG/ACT inhaler, Inhale 2 puffs into the lungs every 6 (six) hours as needed for wheezing or shortness of breath., Disp: 18 g, Rfl: 5   albuterol (VENTOLIN HFA) 108 (90 Base) MCG/ACT inhaler, TAKE 2 PUFFS BY MOUTH EVERY 6 HOURS AS NEEDED FOR WHEEZE OR SHORTNESS OF BREATH, Disp: 18 g, Rfl: 3  amoxicillin-clavulanate (AUGMENTIN) 875-125 MG tablet, Take 1 tablet by mouth 2 (two) times daily., Disp: 20 tablet, Rfl: 0   amphetamine-dextroamphetamine (ADDERALL) 30 MG tablet, Take 1 tablet by mouth 2 (two) times daily., Disp: 180 tablet, Rfl: 0   benzonatate (TESSALON) 200 MG capsule, Take 1 capsule (200 mg total) by mouth 3 (three) times daily as needed for cough., Disp: 30 capsule, Rfl: 0    HYDROcodone-acetaminophen (NORCO) 10-325 MG tablet, Take 1 tablet by mouth every 6 (six) hours as needed., Disp: 30 tablet, Rfl: 0   INCASSIA 0.35 MG tablet, TAKE 1 TABLET BY MOUTH EVERY DAY, Disp: 84 tablet, Rfl: 1   meclizine (ANTIVERT) 25 MG tablet, TAKE 1 TABLET BY MOUTH 3 TIMES DAILY AS NEEDED FOR DIZZINESS., Disp: 30 tablet, Rfl: 0   metoprolol succinate (TOPROL-XL) 50 MG 24 hr tablet, Take 1 tablet (50 mg total) by mouth daily. Take with or immediately following a meal., Disp: 90 tablet, Rfl: 3   mometasone (NASONEX) 50 MCG/ACT nasal spray, PLACE 2 SPRAYS INTO THE NOSE DAILY., Disp: 51 each, Rfl: 4   ondansetron (ZOFRAN-ODT) 8 MG disintegrating tablet, TAKE 1 TABLET BY MOUTH EVERY 8 HOURS AS NEEDED FOR NAUSEA AND VOMITING, Disp: 20 tablet, Rfl: 1   pantoprazole (PROTONIX) 40 MG tablet, TAKE 1 TABLET BY MOUTH EVERY DAY, Disp: 90 tablet, Rfl: 3   predniSONE (DELTASONE) 10 MG tablet, TAKE 3 TABLETS PO QD FOR 3 DAYS THEN TAKE 2 TABLETS PO QD FOR 3 DAYS THEN TAKE 1 TABLET PO QD FOR 3 DAYS THEN TAKE 1/2 TAB PO QD FOR 3 DAYS, Disp: 20 tablet, Rfl: 0   tiZANidine (ZANAFLEX) 4 MG tablet, Take 1 tablet (4 mg total) by mouth every 6 (six) hours as needed for muscle spasms., Disp: 90 tablet, Rfl: 1   traMADol (ULTRAM) 50 MG tablet, Take 1 tablet (50 mg total) by mouth every 8 (eight) hours as needed., Disp: 30 tablet, Rfl: 0   Ubrogepant (UBRELVY) 50 MG TABS, 1 po x1, may repeat after 2 hours, Disp: 30 tablet, Rfl: 2  Allergies  Allergen Reactions   Ketorolac Tromethamine Hives   Morphine Other (See Comments)    "makes me the devil."   Latex Rash and Other (See Comments)    Latex catheter caused extreme irritation    Objective:   VITALS: Per patient if applicable, see vitals. GENERAL: Alert, appears well and in no acute distress. HEENT: Atraumatic, conjunctiva clear, no obvious abnormalities on inspection of external nose and ears. NECK: Normal movements of the head and neck. CARDIOPULMONARY:  No increased WOB. Speaking in clear sentences. I:E ratio WNL.  MS: Moves all visible extremities without noticeable abnormality. PSYCH: Pleasant and cooperative, well-groomed. Speech normal rate and rhythm. Affect is appropriate. Insight and judgement are appropriate. Attention is focused, linear, and appropriate.  NEURO: CN grossly intact. Oriented as arrived to appointment on time with no prompting. Moves both UE equally.  SKIN: No obvious lesions, wounds, erythema, or cyanosis noted on face or hands.  Patient came to the office and had COVID PCR swab and negative rapid flu swab.  Vitals:   06/01/21 1125  BP: 130/86  Pulse: 92  Temp: 97.8 F (36.6 C)  SpO2: 95%   Heart and lung exam WNL Skin turgor adequate  Assessment and Plan:   Jo Jackson was seen today for diarrhea.  Diagnoses and all orders for this visit:  Diarrhea, unspecified type; Flu-like symptoms Flu test negative Vitals stable in office Will order blood work and stool  studies; suspect viral GI bug Recommend holding off on continuing augmentin at this time due to severe diarrhea Recommend pushing fluids and bland foods If inability to keep fluids down or signs of dehydration, she was instructed to go to the ER -     CBC with Differential/Platelet -     Comp Met (CMET) -     Urinalysis, Routine w reflex microscopic; Future -     Urine Culture -     Gastrointestinal Pathogen Panel PCR -     POCT Influenza A/B -     Novel Coronavirus, NAA ([facility])  I discussed the assessment and treatment plan with the patient. The patient was provided an opportunity to ask questions and all were answered. The patient agreed with the plan and demonstrated an understanding of the instructions.   The patient was advised to call back or seek an in-person evaluation if the symptoms worsen or if the condition fails to improve as anticipated.   Faythe Dingwall, PA, have reviewed all documentation for this visit. The documentation on  06/01/21 for the exam, diagnosis, procedures, and orders are all accurate and complete.  [place], [place] 06/01/2021

## 2021-06-01 NOTE — Addendum Note (Signed)
Addended by: Haynes Bast on: 06/01/2021 03:07 PM   Modules accepted: Orders

## 2021-06-02 LAB — URINE CULTURE
MICRO NUMBER:: 12615189
SPECIMEN QUALITY:: ADEQUATE

## 2021-06-02 LAB — COMPREHENSIVE METABOLIC PANEL
ALT: 18 U/L (ref 0–35)
AST: 17 U/L (ref 0–37)
Albumin: 3.8 g/dL (ref 3.5–5.2)
Alkaline Phosphatase: 74 U/L (ref 39–117)
BUN: 21 mg/dL (ref 6–23)
CO2: 26 mEq/L (ref 19–32)
Calcium: 9.1 mg/dL (ref 8.4–10.5)
Chloride: 102 mEq/L (ref 96–112)
Creatinine, Ser: 0.75 mg/dL (ref 0.40–1.20)
GFR: 93.52 mL/min (ref >60.00)
Glucose, Bld: 135 mg/dL — ABNORMAL HIGH (ref 70–99)
Potassium: 3.9 mEq/L (ref 3.5–5.1)
Sodium: 136 mEq/L (ref 135–145)
Total Bilirubin: 0.6 mg/dL (ref 0.2–1.2)
Total Protein: 6.8 g/dL (ref 6.0–8.3)

## 2021-06-02 LAB — URINALYSIS, ROUTINE W REFLEX MICROSCOPIC
Bilirubin Urine: NEGATIVE
Hgb urine dipstick: NEGATIVE
Ketones, ur: NEGATIVE
Leukocytes,Ua: NEGATIVE
Nitrite: NEGATIVE
Specific Gravity, Urine: >=1.03 — AB (ref 1.000–1.030)
Total Protein, Urine: NEGATIVE
Urine Glucose: NEGATIVE
Urobilinogen, UA: 0.2 (ref 0.0–1.0)
pH: 6 (ref 5.0–8.0)

## 2021-06-02 LAB — CBC WITH DIFFERENTIAL/PLATELET
Basophils Absolute: 0 10*3/uL (ref 0.0–0.1)
Basophils Relative: 0.6 % (ref 0.0–3.0)
Eosinophils Absolute: 0.1 10*3/uL (ref 0.0–0.7)
Eosinophils Relative: 1 % (ref 0.0–5.0)
HCT: 39.9 % (ref 36.0–46.0)
Hemoglobin: 13.6 g/dL (ref 12.0–15.0)
Lymphocytes Relative: 13.2 % (ref 12.0–46.0)
Lymphs Abs: 1 10*3/uL (ref 0.7–4.0)
MCHC: 34 g/dL (ref 30.0–36.0)
MCV: 92.6 fl (ref 78.0–100.0)
Monocytes Absolute: 0.5 10*3/uL (ref 0.1–1.0)
Monocytes Relative: 6.1 % (ref 3.0–12.0)
Neutro Abs: 6.3 10*3/uL (ref 1.4–7.7)
Neutrophils Relative %: 79.1 % — ABNORMAL HIGH (ref 43.0–77.0)
Platelets: 253 10*3/uL (ref 150.0–400.0)
RBC: 4.31 Mil/uL (ref 3.87–5.11)
RDW: 13.1 % (ref 11.5–15.5)
WBC: 7.9 10*3/uL (ref 4.0–10.5)

## 2021-06-02 NOTE — Addendum Note (Signed)
Addended by: Lorn Junes on: 06/02/2021 01:24 PM   Modules accepted: Orders

## 2021-06-02 NOTE — Addendum Note (Signed)
Addended by: Lorn Junes on: 06/02/2021 01:25 PM   Modules accepted: Orders

## 2021-06-03 LAB — GASTROINTESTINAL PATHOGEN PANEL PCR
C. difficile Tox A/B, PCR: NOT DETECTED
Campylobacter, PCR: NOT DETECTED
Cryptosporidium, PCR: NOT DETECTED
E coli (ETEC) LT/ST PCR: NOT DETECTED
E coli (STEC) stx1/stx2, PCR: NOT DETECTED
E coli 0157, PCR: NOT DETECTED
Giardia lamblia, PCR: NOT DETECTED
Norovirus, PCR: NOT DETECTED
Rotavirus A, PCR: NOT DETECTED
Salmonella, PCR: NOT DETECTED
Shigella, PCR: NOT DETECTED

## 2021-06-03 LAB — SPECIMEN STATUS REPORT

## 2021-06-03 LAB — SARS-COV-2, NAA 2 DAY TAT

## 2021-06-03 LAB — NOVEL CORONAVIRUS, NAA: SARS-CoV-2, NAA: NOT DETECTED

## 2021-06-04 ENCOUNTER — Other Ambulatory Visit: Payer: Self-pay | Admitting: Family Medicine

## 2021-06-04 DIAGNOSIS — M62838 Other muscle spasm: Secondary | ICD-10-CM

## 2021-06-07 ENCOUNTER — Encounter: Payer: Self-pay | Admitting: Family Medicine

## 2021-06-07 ENCOUNTER — Other Ambulatory Visit: Payer: Self-pay

## 2021-06-07 ENCOUNTER — Ambulatory Visit (INDEPENDENT_AMBULATORY_CARE_PROVIDER_SITE_OTHER): Payer: BC Managed Care – PPO | Admitting: Family Medicine

## 2021-06-07 ENCOUNTER — Ambulatory Visit (HOSPITAL_BASED_OUTPATIENT_CLINIC_OR_DEPARTMENT_OTHER)
Admission: RE | Admit: 2021-06-07 | Discharge: 2021-06-07 | Disposition: A | Discharge status: Home / Self Care | Payer: BC Managed Care – PPO | Source: Ambulatory Visit | Attending: Family Medicine | Admitting: Family Medicine

## 2021-06-07 VITALS — BP 140/78 | HR 86 | Temp 98.0°F | Resp 18 | Ht 67.0 in | Wt 287.9 lb

## 2021-06-07 DIAGNOSIS — R051 Acute cough: Secondary | ICD-10-CM | POA: Insufficient documentation

## 2021-06-07 DIAGNOSIS — I10 Essential (primary) hypertension: Secondary | ICD-10-CM | POA: Diagnosis not present

## 2021-06-07 DIAGNOSIS — J40 Bronchitis, not specified as acute or chronic: Secondary | ICD-10-CM | POA: Diagnosis not present

## 2021-06-07 MED ORDER — FLUTICASONE-SALMETEROL 250-50 MCG/ACT IN AEPB: 1.0000 | INHALATION_SPRAY | Freq: Two times a day (BID) | RESPIRATORY_TRACT | 3 refills | Status: DC

## 2021-06-07 MED ORDER — LEVOFLOXACIN 500 MG PO TABS: 500.0000 mg | ORAL_TABLET | Freq: Every day | ORAL | 0 refills | Status: AC

## 2021-06-07 NOTE — Assessment & Plan Note (Signed)
abx per orders con't albuterol Add advair  Check cxr  F/u prn

## 2021-06-07 NOTE — Progress Notes (Signed)
Subjective:   By signing my name below, I, [name], attest that this documentation has been prepared under the direction and in the presence of Dr. Roma Schanz, DO. 06/07/2021    Patient ID: Jo Jackson, female    DOB: 04/02/1971, 50 y.o.   MRN: [id number]  Chief Complaint  Patient presents with   Hypertension   Bronchitis   Follow-up    HPI Patient is in today for a office visit.   Chest cold- She complains of a coughing and chest congestion for the past 3 weeks. She completed azithromycin, Augmentin and found no relief. She takes albuterol and a nebulizer to manage her symptoms and finds relief. She has chest tightness. Her cough is not keeping her up at night. She has tried Advair years ago and found relief when using it.  Migraine- She continues having migraines at this time. She takes Imitrex to manage her symptoms and finds relief.  Diarrhea- Her diarrhea has resolved at this time. She reports her blood work showed no new issues.  Blood pressure- Her blood pressure is slightly elevated. She has not taken her blood pressure medications this morning. She continues taking 50 mg metoprolol succinate daily PO and reports no new issues while taking it.   BP Readings from Last 3 Encounters:  06/07/21 140/78  06/01/21 130/86  05/24/21 (!) 157/98   Pulse Readings from Last 3 Encounters:  06/07/21 86  06/01/21 92  05/24/21 86    Past Medical History:  Diagnosis Date   Abnormal Pap smear    Repeats WNL   ADD (attention deficit disorder)    Anxiety    Arthritis    Oseteoarthritis   Depression    GERD (gastroesophageal reflux disease)    Headache(784.0)    Migraines   Junctional escape rhythm    Neuromuscular disorder (HCC)    Fibromyalgia   Pregnancy induced hypertension    Supraventricular tachycardia ([place])     Past Surgical History:  Procedure Laterality Date   ABLATION OF DYSRHYTHMIC FOCUS     APPENDECTOMY  1986   CARPAL TUNNEL RELEASE  2000    CERVICAL FUSION  08-24-99,04-26-00,05-09-01   Knee sugery Left    16 yrs. old   SHOULDER SURGERY  left-6-04, right 09-12-04   tongue polyp  1984    Family History  Problem Relation Age of Onset   Hodgkin's lymphoma Father    COPD Mother    Heart disease Mother        chf   Depression Mother    Cancer Paternal Aunt        breast   Diabetes Other    Thyroid disease Other    Hypertension Other    Depression Other    Bipolar disorder Other    ADD / ADHD Brother     Social History   Socioeconomic History   Marital status: Widowed    Spouse name: Not on file   Number of children: 2   Years of education: Not on file   Highest education level: Not on file  Occupational History    Employer: [employer]    Comment: P/T   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    Employer: [employer]  Tobacco Use   Smoking status: Former    Types: Cigarettes    Quit date: 10/23/2007    Years since quitting: 13.6   Smokeless tobacco: Never  Vaping Use   Vaping Use: Never used  Substance and Sexual Activity   Alcohol use: Yes  Alcohol/week: 0.0 standard drinks    Comment: Occ   Drug use: No   Sexual activity: Yes  Other Topics Concern   Not on file  Social History Narrative   G1p1      Exercise- no   Social Determinants of Health   Financial Resource Strain: Not on file  Food Insecurity: Not on file  Transportation Needs: Not on file  Physical Activity: Not on file  Stress: Not on file  Social Connections: Not on file  Intimate Partner Violence: Not on file    Outpatient Medications Prior to Visit  Medication Sig Dispense Refill   albuterol (PROVENTIL) (2.5 MG/3ML) 0.083% nebulizer solution Take 3 mLs (2.5 mg total) by nebulization every 6 (six) hours as needed for wheezing or shortness of breath. 75 mL 12   albuterol (VENTOLIN HFA) 108 (90 Base) MCG/ACT inhaler Inhale 2 puffs into the lungs every 6 (six) hours as needed for wheezing or shortness of breath. 18 g 5   albuterol  (VENTOLIN HFA) 108 (90 Base) MCG/ACT inhaler TAKE 2 PUFFS BY MOUTH EVERY 6 HOURS AS NEEDED FOR WHEEZE OR SHORTNESS OF BREATH 18 g 3   amphetamine-dextroamphetamine (ADDERALL) 30 MG tablet Take 1 tablet by mouth 2 (two) times daily. 180 tablet 0   HYDROcodone-acetaminophen (NORCO) 10-325 MG tablet Take 1 tablet by mouth every 6 (six) hours as needed. 30 tablet 0   INCASSIA 0.35 MG tablet TAKE 1 TABLET BY MOUTH EVERY DAY 84 tablet 1   meclizine (ANTIVERT) 25 MG tablet TAKE 1 TABLET BY MOUTH 3 TIMES DAILY AS NEEDED FOR DIZZINESS. 30 tablet 0   metoprolol succinate (TOPROL-XL) 50 MG 24 hr tablet Take 1 tablet (50 mg total) by mouth daily. Take with or immediately following a meal. 90 tablet 3   mometasone (NASONEX) 50 MCG/ACT nasal spray PLACE 2 SPRAYS INTO THE NOSE DAILY. 51 each 4   ondansetron (ZOFRAN-ODT) 8 MG disintegrating tablet TAKE 1 TABLET BY MOUTH EVERY 8 HOURS AS NEEDED FOR NAUSEA AND VOMITING 20 tablet 1   pantoprazole (PROTONIX) 40 MG tablet TAKE 1 TABLET BY MOUTH EVERY DAY 90 tablet 3   predniSONE (DELTASONE) 10 MG tablet TAKE 3 TABLETS PO QD FOR 3 DAYS THEN TAKE 2 TABLETS PO QD FOR 3 DAYS THEN TAKE 1 TABLET PO QD FOR 3 DAYS THEN TAKE 1/2 TAB PO QD FOR 3 DAYS 20 tablet 0   tiZANidine (ZANAFLEX) 4 MG tablet TAKE 1 TABLET BY MOUTH EVERY 6 HOURS AS NEEDED FOR MUSCLE SPASMS. 90 tablet 1   traMADol (ULTRAM) 50 MG tablet Take 1 tablet (50 mg total) by mouth every 8 (eight) hours as needed. 30 tablet 0   Ubrogepant (UBRELVY) 50 MG TABS 1 po x1, may repeat after 2 hours 30 tablet 2   amoxicillin-clavulanate (AUGMENTIN) 875-125 MG tablet Take 1 tablet by mouth 2 (two) times daily. (Patient not taking: Reported on 06/07/2021) 20 tablet 0   benzonatate (TESSALON) 200 MG capsule Take 1 capsule (200 mg total) by mouth 3 (three) times daily as needed for cough. (Patient not taking: Reported on 06/07/2021) 30 capsule 0   No facility-administered medications prior to visit.    Allergies  Allergen  Reactions   Ketorolac Tromethamine Hives   Morphine Other (See Comments)    "makes me the devil."   Latex Rash and Other (See Comments)    Latex catheter caused extreme irritation    Review of Systems  Constitutional:  Negative for chills, fever and malaise/fatigue.  HENT:  Negative for congestion and hearing loss.   Eyes:  Negative for discharge.  Respiratory:  Positive for cough. Negative for sputum production and shortness of breath.        (+)chest congestion  Cardiovascular:  Negative for chest pain, palpitations and leg swelling.  Gastrointestinal:  Negative for abdominal pain, blood in stool, constipation, diarrhea, heartburn, nausea and vomiting.  Genitourinary:  Negative for dysuria, frequency, hematuria and urgency.  Musculoskeletal:  Negative for back pain, falls and myalgias.  Skin:  Negative for rash.  Neurological:  Positive for headaches (migraines). Negative for dizziness, sensory change, loss of consciousness and weakness.  Endo/Heme/Allergies:  Negative for environmental allergies. Does not bruise/bleed easily.  Psychiatric/Behavioral:  Negative for depression and suicidal ideas. The patient is not nervous/anxious and does not have insomnia.       Objective:    Physical Exam Vitals and nursing note reviewed.  Constitutional:      General: She is not in acute distress.    Appearance: Normal appearance. She is not ill-appearing.  HENT:     Head: Normocephalic and atraumatic.     Right Ear: External ear normal.     Left Ear: External ear normal.  Eyes:     Extraocular Movements: Extraocular movements intact.     Pupils: Pupils are equal, round, and reactive to light.  Cardiovascular:     Rate and Rhythm: Normal rate and regular rhythm.     Heart sounds: Normal heart sounds. No murmur heard.   No gallop.  Pulmonary:     Effort: Pulmonary effort is normal.     Breath sounds: Decreased breath sounds present. No wheezing.  Skin:    General: Skin is warm and  dry.  Neurological:     Mental Status: She is alert and oriented to person, place, and time.  Psychiatric:        Behavior: Behavior normal.        Judgment: Judgment normal.    BP 140/78 (BP Location: Right Arm, Patient Position: Sitting, Cuff Size: Large)   Pulse 86   Temp 98 F (36.7 C) (Oral)   Resp 18   Ht 5\' 7"  (1.702 m)   Wt 287 lb 14.4 oz (130.6 kg)   LMP 05/25/2021   SpO2 97%   BMI 45.09 kg/m  Wt Readings from Last 3 Encounters:  06/07/21 287 lb 14.4 oz (130.6 kg)  06/01/21 282 lb (127.9 kg)  05/24/21 283 lb 3.2 oz (128.5 kg)    Diabetic Foot Exam - Simple   No data filed    Lab Results  Component Value Date   WBC 7.9 06/01/2021   HGB 13.6 06/01/2021   HCT 39.9 06/01/2021   PLT 253.0 06/01/2021   GLUCOSE 135 (H) 06/01/2021   CHOL 140 12/21/2020   TRIG 59.0 12/21/2020   HDL 47.80 12/21/2020   LDLCALC 80 12/21/2020   ALT 18 06/01/2021   AST 17 06/01/2021   NA 136 06/01/2021   K 3.9 06/01/2021   CL 102 06/01/2021   CREATININE 0.75 06/01/2021   BUN 21 06/01/2021   CO2 26 06/01/2021   TSH 1.67 12/21/2020   HGBA1C 5.4 06/06/2013    Lab Results  Component Value Date   TSH 1.67 12/21/2020   Lab Results  Component Value Date   WBC 7.9 06/01/2021   HGB 13.6 06/01/2021   HCT 39.9 06/01/2021   MCV 92.6 06/01/2021   PLT 253.0 06/01/2021   Lab Results  Component Value Date   NA 136  06/01/2021   K 3.9 06/01/2021   CO2 26 06/01/2021   GLUCOSE 135 (H) 06/01/2021   BUN 21 06/01/2021   CREATININE 0.75 06/01/2021   BILITOT 0.6 06/01/2021   ALKPHOS 74 06/01/2021   AST 17 06/01/2021   ALT 18 06/01/2021   PROT 6.8 06/01/2021   ALBUMIN 3.8 06/01/2021   CALCIUM 9.1 06/01/2021   GFR 93.52 06/01/2021   Lab Results  Component Value Date   CHOL 140 12/21/2020   Lab Results  Component Value Date   HDL 47.80 12/21/2020   Lab Results  Component Value Date   LDLCALC 80 12/21/2020   Lab Results  Component Value Date   TRIG 59.0 12/21/2020   Lab  Results  Component Value Date   CHOLHDL 3 12/21/2020   Lab Results  Component Value Date   HGBA1C 5.4 06/06/2013       Assessment & Plan:   Problem List Items Addressed This Visit       Unprioritized   Bronchitis - Primary    abx per orders con't albuterol Add advair  Check cxr  F/u prn       Relevant Medications   levofloxacin (LEVAQUIN) 500 MG tablet   fluticasone-salmeterol (ADVAIR DISKUS) 250-50 MCG/ACT AEPB   Primary hypertension    Well controlled, no changes to meds. Encouraged heart healthy diet such as the DASH diet and exercise as tolerated.       Other Visit Diagnoses     Acute cough       Relevant Orders   DG Chest 2 View        Meds ordered this encounter  Medications   levofloxacin (LEVAQUIN) 500 MG tablet    Sig: Take 1 tablet (500 mg total) by mouth daily for 7 days.    Dispense:  7 tablet    Refill:  0   fluticasone-salmeterol (ADVAIR DISKUS) 250-50 MCG/ACT AEPB    Sig: Inhale 1 puff into the lungs in the morning and at bedtime.    Dispense:  1 each    Refill:  3    I, Dr. Roma Schanz, DO, personally preformed the services described in this documentation.  All medical record entries made by the scribe were at my direction and in my presence.  I have reviewed the chart and discharge instructions (if applicable) and agree that the record reflects my personal performance and is accurate and complete. 06/07/2021   I,[name],acting as a scribe for Ann Held, DO.,have documented all relevant documentation on the behalf of Ann Held, DO,as directed by  Ann Held, DO while in the presence of Ann Held, DO.   Ann Held, DO

## 2021-06-07 NOTE — Assessment & Plan Note (Signed)
Well controlled, no changes to meds. Encouraged heart healthy diet such as the DASH diet and exercise as tolerated.  °

## 2021-06-07 NOTE — Patient Instructions (Signed)

## 2021-06-24 ENCOUNTER — Encounter: Payer: Self-pay | Admitting: Family Medicine

## 2021-07-06 ENCOUNTER — Telehealth (HOSPITAL_BASED_OUTPATIENT_CLINIC_OR_DEPARTMENT_OTHER): Payer: Self-pay

## 2021-07-11 ENCOUNTER — Encounter: Payer: Self-pay | Admitting: Family Medicine

## 2021-07-11 ENCOUNTER — Telehealth (HOSPITAL_BASED_OUTPATIENT_CLINIC_OR_DEPARTMENT_OTHER): Payer: Self-pay

## 2021-07-11 DIAGNOSIS — Z1231 Encounter for screening mammogram for malignant neoplasm of breast: Secondary | ICD-10-CM

## 2021-07-13 ENCOUNTER — Telehealth (HOSPITAL_BASED_OUTPATIENT_CLINIC_OR_DEPARTMENT_OTHER): Payer: Self-pay

## 2021-07-18 NOTE — Progress Notes (Signed)
NEUROLOGY CONSULTATION NOTE  [name] MRN: [id number] DOB: Oct 19, 1970  Referring provider: Seabron Spates, DO Primary care provider: Seabron Spates, DO  Reason for consult:  headaches  Assessment/Plan:   Migraine without aura, without status migrainosus, not intractable  Migraine prevention:  Start Aimovig 140mg  every 28 days Migraine rescue:  Stop sumatriptan.  She will try Maxalt-MLT 10mg .  May also increase Ubrelvy and try 100mg .  Zofran for nausea. Limit use of pain relievers to no more than 2 days out of week to prevent risk of rebound or medication-overuse headache. Keep headache diary Follow up 7 months.    Subjective:  Jo Jackson is a 50 year old right-handed female with asthma, ADD, depression and migraine who presents for headaches.  History supplemented by referring provider's note.  Onset:  She has had menstrually-related migraines since age 46, but they have been related to the neck since repeated cervical spinal fusions at C6-C7 over 10 years ago, as demonstrated on CT report from 2003. Location:  She develops spasm in the right trapezius which triggers pain in the back of head on the right and radiates to the right temple/eye Quality:  Squeezing, throbbing Intensity:  7/10 Aura:  no Prodrome:  no Associated symptoms:  Both eyes hurt with or without headache.  Photophobia, phonophobia.  Occasional nausea.  No visual disturbance. Duration:  1 hour with medication but can last 2-3 days approximately 1 to 2 times a month Frequency:  During summer months, 5 days a week.  Otherwise, 3 a month Triggers/exacerbating factors:  Previously, it was her period (until she started taking Depo shot), now neck spasms Relieving factors:  Rest, tizanidine Activity:  functions  History of cervical fusion.  CT cervical spine on 08/02/2001 demonstrated:  1.  ANTERIOR AND POSTERIOR FUSION AT C6-7 WITH FRACTURE OF THE SCREWS AT C6.  NO CHANGE IN  ALIGNMENT  WITH APPARENT FUSION AT THE LEVEL OF THE INTERBODY FUSION PLUG.  2.  QUESTIONABLE BULGING ANNULUS AND/OR HERNIATED DISC CENTRALLY  AND TO THE LEFT AT THE C7-T1 LEVEL.  IF THE PRIOR STUDIES CANNOT BE LOCATED FOR COMPARISON,  THEN CT MYELOGRAPHY MAY BE  INDICATED TO ASSESS THIS AREA FURTHER. CT MULTIPLANAR RECONSTRUCTION.  THE AXIAL SCANS WERE RECONSTRUCTED IN BOTH SAGITTAL AND CORONAL PLAINS.  THERE DOES APPEAR TO BE  GOOD BONY FUSION AT THE LEVEL OF THE INTERBODY FUSION PLUG AT C6-7, BUT THE REMAINDER OF THE INTERVERTEBRAL SPACE DOES NOT APPEAR TO BE SOLIDLY FUSED.  NORMAL ALIGNMENT IS MAINTAINED IN THE  SAGITALLY RECONSTRUCTED IMAGES.   Current abortive management:  sumatriptan 50mg  or 100mg , once every 2 to 3 headaches may take Norco or tramadol Current NSAIDS/analgesics:  hydrocodone-acetaminophen, tramadol Current triptans:  sumatriptan 50mg  or 100mg  Current ergotamine:  none Current anti-emetic:  Zofran ODT 8mg  Current muscle relaxants:  tizanidine 4mg  PRN Current Antihypertensive medications:  metoprolol succinate 50mg  Current Antidepressant medications:  Trintellix Current Anticonvulsant medications:  none Current anti-CGRP:  none Current Vitamins/Herbal/Supplements:  none Current Antihistamines/Decongestants:  meclizine Other therapy:  none Hormone/birth control:  none Other medications:  Adderall, albuterol  Past NSAIDS/analgesics:  meloxicam, Tylenol, ibuprofen 800mg  Past abortive triptans:  sumatriptan [place] Past abortive ergotamine:  none Past muscle relaxants:  cyclobenzaprine, methocarbamol, SOma Past anti-emetic:  none Past antihypertensive medications:  propranolol Past antidepressant medications:  venlafaxine, duloxetine, Viibryd Past anticonvulsant medications:  topiramate Past anti-CGRP:  Ubrelvy 50mg  Past vitamins/Herbal/Supplements:  none Past antihistamines/decongestants:  Nasonex Other past therapies:  none  Caffeine:  Sweet tea Alcohol:  occasionally Smoker:   no Diet:  Needs to hydrate more Exercise:  Not routine Depression/stress:  better Sleep hygiene:  varies Other pain:  fibromyalgia. Family history of headache:  mom      PAST MEDICAL HISTORY: Past Medical History:  Diagnosis Date   Abnormal Pap smear    Repeats WNL   ADD (attention deficit disorder)    Anxiety    Arthritis    Oseteoarthritis   Depression    GERD (gastroesophageal reflux disease)    Headache(784.0)    Migraines   Junctional escape rhythm    Neuromuscular disorder (HCC)    Fibromyalgia   Pregnancy induced hypertension    Supraventricular tachycardia (HCC)     PAST SURGICAL HISTORY: Past Surgical History:  Procedure Laterality Date   ABLATION OF DYSRHYTHMIC FOCUS     APPENDECTOMY  1986   CARPAL TUNNEL RELEASE  2000   CERVICAL FUSION  08-24-99,04-26-00,05-09-01   Knee sugery Left    16 yrs. old   SHOULDER SURGERY  left-6-04, right 09-12-04   tongue polyp  1984    MEDICATIONS: Current Outpatient Medications on File Prior to Visit  Medication Sig Dispense Refill   albuterol (PROVENTIL) (2.5 MG/3ML) 0.083% nebulizer solution Take 3 mLs (2.5 mg total) by nebulization every 6 (six) hours as needed for wheezing or shortness of breath. 75 mL 12   albuterol (VENTOLIN HFA) 108 (90 Base) MCG/ACT inhaler Inhale 2 puffs into the lungs every 6 (six) hours as needed for wheezing or shortness of breath. 18 g 5   albuterol (VENTOLIN HFA) 108 (90 Base) MCG/ACT inhaler TAKE 2 PUFFS BY MOUTH EVERY 6 HOURS AS NEEDED FOR WHEEZE OR SHORTNESS OF BREATH 18 g 3   amphetamine-dextroamphetamine (ADDERALL) 30 MG tablet Take 1 tablet by mouth 2 (two) times daily. 180 tablet 0   fluticasone-salmeterol (ADVAIR DISKUS) 250-50 MCG/ACT AEPB Inhale 1 puff into the lungs in the morning and at bedtime. 1 each 3   HYDROcodone-acetaminophen (NORCO) 10-325 MG tablet Take 1 tablet by mouth every 6 (six) hours as needed. 30 tablet 0   INCASSIA 0.35 MG tablet TAKE 1 TABLET BY MOUTH EVERY DAY 84  tablet 1   meclizine (ANTIVERT) 25 MG tablet TAKE 1 TABLET BY MOUTH 3 TIMES DAILY AS NEEDED FOR DIZZINESS. 30 tablet 0   metoprolol succinate (TOPROL-XL) 50 MG 24 hr tablet Take 1 tablet (50 mg total) by mouth daily. Take with or immediately following a meal. 90 tablet 3   mometasone (NASONEX) 50 MCG/ACT nasal spray PLACE 2 SPRAYS INTO THE NOSE DAILY. 51 each 4   ondansetron (ZOFRAN-ODT) 8 MG disintegrating tablet TAKE 1 TABLET BY MOUTH EVERY 8 HOURS AS NEEDED FOR NAUSEA AND VOMITING 20 tablet 1   pantoprazole (PROTONIX) 40 MG tablet TAKE 1 TABLET BY MOUTH EVERY DAY 90 tablet 3   predniSONE (DELTASONE) 10 MG tablet TAKE 3 TABLETS PO QD FOR 3 DAYS THEN TAKE 2 TABLETS PO QD FOR 3 DAYS THEN TAKE 1 TABLET PO QD FOR 3 DAYS THEN TAKE 1/2 TAB PO QD FOR 3 DAYS 20 tablet 0   tiZANidine (ZANAFLEX) 4 MG tablet TAKE 1 TABLET BY MOUTH EVERY 6 HOURS AS NEEDED FOR MUSCLE SPASMS. 90 tablet 1   traMADol (ULTRAM) 50 MG tablet Take 1 tablet (50 mg total) by mouth every 8 (eight) hours as needed. 30 tablet 0   Ubrogepant (UBRELVY) 50 MG TABS 1 po x1, may repeat after 2 hours 30 tablet 2   No current facility-administered medications on  file prior to visit.    ALLERGIES: Allergies  Allergen Reactions   Ketorolac Tromethamine Hives   Morphine Other (See Comments)    "makes me the devil."   Latex Rash and Other (See Comments)    Latex catheter caused extreme irritation    FAMILY HISTORY: Family History  Problem Relation Age of Onset   Hodgkin's lymphoma Father    COPD Mother    Heart disease Mother        chf   Depression Mother    Cancer Paternal Aunt        breast   Diabetes Other    Thyroid disease Other    Hypertension Other    Depression Other    Bipolar disorder Other    ADD / ADHD Brother     Objective:  Blood pressure (!) 151/84, pulse 70, height 5\' 7"  (1.702 m), weight 283 lb (128.4 kg), SpO2 98 %. General: No acute distress.  Patient appears well-groomed.   Head:   Normocephalic/atraumatic Eyes:  fundi examined but not visualized Neck: supple, bilateral paraspinal tenderness, full range of motion Back: No paraspinal tenderness Heart: regular rate and rhythm Lungs: Clear to auscultation bilaterally. Vascular: No carotid bruits. Neurological Exam: Mental status: alert and oriented to person, place, and time, recent and remote memory intact, fund of knowledge intact, attention and concentration intact, speech fluent and not dysarthric, language intact. Cranial nerves: CN I: not tested CN II: pupils equal, round and reactive to light, visual fields intact CN III, IV, VI:  full range of motion, no nystagmus, no ptosis CN V: facial sensation intact. CN VII: upper and lower face symmetric CN VIII: hearing intact CN IX, X: gag intact, uvula midline CN XI: sternocleidomastoid and trapezius muscles intact CN XII: tongue midline Bulk & Tone: normal, no fasciculations. Motor:  muscle strength 5/5 throughout Sensation:  Pinprick, temperature and vibratory sensation intact. Deep Tendon Reflexes:  2+ throughout,  toes downgoing.   Finger to nose testing:  Without dysmetria.   Heel to shin:  Without dysmetria.   Gait:  Normal station and stride.  Romberg negative.    Thank you for allowing me to take part in the care of this patient.  , DO  CC: Shon Millet, DO

## 2021-07-19 ENCOUNTER — Other Ambulatory Visit: Payer: Self-pay | Admitting: Family Medicine

## 2021-07-19 ENCOUNTER — Encounter: Payer: Self-pay | Admitting: Neurology

## 2021-07-19 ENCOUNTER — Encounter (HOSPITAL_BASED_OUTPATIENT_CLINIC_OR_DEPARTMENT_OTHER): Payer: BC Managed Care – PPO

## 2021-07-19 ENCOUNTER — Other Ambulatory Visit: Payer: Self-pay

## 2021-07-19 ENCOUNTER — Ambulatory Visit (INDEPENDENT_AMBULATORY_CARE_PROVIDER_SITE_OTHER): Payer: BC Managed Care – PPO | Admitting: Neurology

## 2021-07-19 VITALS — BP 151/84 | HR 70 | Ht 67.0 in | Wt 283.0 lb

## 2021-07-19 DIAGNOSIS — G43009 Migraine without aura, not intractable, without status migrainosus: Secondary | ICD-10-CM

## 2021-07-19 DIAGNOSIS — Z1231 Encounter for screening mammogram for malignant neoplasm of breast: Secondary | ICD-10-CM

## 2021-07-19 MED ORDER — RIZATRIPTAN BENZOATE 10 MG PO TBDP: 10.0000 mg | ORAL_TABLET | ORAL | 0 refills | Status: DC | PRN

## 2021-07-19 NOTE — Progress Notes (Signed)
Medication Samples have been provided to the patient.  Drug name: Aimovig      Strength: 140 mg        Qty: 1  LOT: [id number]  Exp.Date: 06/2022  Dosing instructions: every 30 days  The patient has been instructed regarding the correct time, dose, and frequency of taking this medication, including desired effects and most common side effects.   Leida Lauth 3:20 PM 07/19/2021

## 2021-07-19 NOTE — Patient Instructions (Signed)
°  Start Aimovig 140mg  every 28 days.   Take rizatriptan 10mg  at earliest onset of headache.  May repeat dose once in 2 hours if needed.  Maximum 2 tablets in 24 hours.  You may also try taking Ubrelvy 100mg  (may repeat after 2 hours - maximum 2 tablets in 24 hours).  Stop sumatriptan. Limit use of pain relievers to no more than 2 days out of the week.  These medications include acetaminophen, NSAIDs (ibuprofen/Advil/Motrin, naproxen/Aleve, triptans (Imitrex/sumatriptan), Excedrin, and narcotics.  This will help reduce risk of rebound headaches. Be aware of common food triggers:  - Caffeine:  coffee, black tea, cola, Mt. Dew  - Chocolate  - Dairy:  aged cheeses (brie, blue, cheddar, gouda, [place], provolone, [place], Swiss, etc), chocolate milk, buttermilk, sour cream, limit eggs and yogurt  - Nuts, peanut butter  - Alcohol  - Cereals/grains:  FRESH breads (fresh bagels, sourdough, doughnuts), yeast productions  - Processed/canned/aged/cured meats (pre-packaged deli meats, hotdogs)  - MSG/glutamate:  soy sauce, flavor enhancer, pickled/preserved/marinated foods  - Sweeteners:  aspartame (Equal, Nutrasweet).  Sugar and Splenda are okay  - Vegetables:  legumes (lima beans, lentils, snow peas, fava beans, pinto peans, peas, garbanzo beans), sauerkraut, onions, olives, pickles  - Fruit:  avocados, bananas, citrus fruit (orange, lemon, grapefruit), mango  - Other:  Frozen meals, macaroni and cheese Routine exercise Stay adequately hydrated (aim for 64 oz water daily) Keep headache diary Maintain proper stress management Maintain proper sleep hygiene Do not skip meals Consider supplements:  magnesium citrate 400mg  daily, riboflavin 400mg  daily, coenzyme Q10 100mg  three times daily.

## 2021-07-27 ENCOUNTER — Other Ambulatory Visit: Payer: Self-pay | Admitting: Neurology

## 2021-07-27 MED ORDER — AIMOVIG 140 MG/ML ~~LOC~~ SOAJ: 140.0000 mg | SUBCUTANEOUS | 5 refills | Status: DC

## 2021-07-27 NOTE — Progress Notes (Signed)
Sent script for Aimovig 140mg  to [facility] on [address]

## 2021-08-07 ENCOUNTER — Other Ambulatory Visit: Payer: Self-pay | Admitting: Family Medicine

## 2021-08-07 DIAGNOSIS — M62838 Other muscle spasm: Secondary | ICD-10-CM

## 2021-08-07 DIAGNOSIS — R110 Nausea: Secondary | ICD-10-CM

## 2021-08-07 DIAGNOSIS — M797 Fibromyalgia: Secondary | ICD-10-CM

## 2021-08-07 DIAGNOSIS — G43809 Other migraine, not intractable, without status migrainosus: Secondary | ICD-10-CM

## 2021-08-08 ENCOUNTER — Encounter: Payer: Self-pay | Admitting: Family Medicine

## 2021-08-08 MED ORDER — HYDROCODONE-ACETAMINOPHEN 10-325 MG PO TABS: 1.0000 | ORAL_TABLET | Freq: Four times a day (QID) | ORAL | 0 refills | Status: DC | PRN

## 2021-08-08 MED ORDER — TRAMADOL HCL 50 MG PO TABS: 50.0000 mg | ORAL_TABLET | Freq: Three times a day (TID) | ORAL | 0 refills | Status: DC | PRN

## 2021-08-08 NOTE — Telephone Encounter (Signed)
Patient is requesting a refill of the following medications: Requested Prescriptions   Pending Prescriptions Disp Refills   traMADol (ULTRAM) 50 MG tablet 30 tablet 0    Sig: Take 1 tablet (50 mg total) by mouth every 8 (eight) hours as needed.   HYDROcodone-acetaminophen (NORCO) 10-325 MG tablet 30 tablet 0    Sig: Take 1 tablet by mouth every 6 (six) hours as needed.    Date of patient request: 08/07/21 Last office visit: 06/07/21 Date of last refill: 05/05/21 Last refill amount: 30 + 0 for both medications Follow up time period per chart: 09/27/21  Last USD: 03/24/21 CS at that time too.

## 2021-09-17 ENCOUNTER — Other Ambulatory Visit: Payer: Self-pay | Admitting: Family Medicine

## 2021-09-17 DIAGNOSIS — M62838 Other muscle spasm: Secondary | ICD-10-CM

## 2021-09-26 ENCOUNTER — Other Ambulatory Visit: Payer: Self-pay

## 2021-09-26 ENCOUNTER — Encounter: Payer: Self-pay | Admitting: Family Medicine

## 2021-09-26 ENCOUNTER — Encounter: Payer: Self-pay | Admitting: Neurology

## 2021-09-26 DIAGNOSIS — J40 Bronchitis, not specified as acute or chronic: Secondary | ICD-10-CM

## 2021-09-26 MED ORDER — FLUTICASONE-SALMETEROL 250-50 MCG/ACT IN AEPB: 1.0000 | INHALATION_SPRAY | Freq: Two times a day (BID) | RESPIRATORY_TRACT | 1 refills | Status: DC

## 2021-09-26 MED ORDER — AIMOVIG 140 MG/ML ~~LOC~~ SOAJ: 140.0000 mg | SUBCUTANEOUS | 1 refills | Status: DC

## 2021-09-26 NOTE — Telephone Encounter (Signed)
Patient ask to have Aimovig sent In for a 90 day supply.  ?90 days sent in.  ?

## 2021-09-27 ENCOUNTER — Ambulatory Visit: Payer: BC Managed Care – PPO | Admitting: Family Medicine

## 2021-09-27 ENCOUNTER — Encounter: Payer: Self-pay | Admitting: Family Medicine

## 2021-09-27 DIAGNOSIS — M62838 Other muscle spasm: Secondary | ICD-10-CM

## 2021-09-27 DIAGNOSIS — G43009 Migraine without aura, not intractable, without status migrainosus: Secondary | ICD-10-CM

## 2021-09-27 DIAGNOSIS — K219 Gastro-esophageal reflux disease without esophagitis: Secondary | ICD-10-CM

## 2021-09-27 DIAGNOSIS — F3342 Major depressive disorder, recurrent, in full remission: Secondary | ICD-10-CM

## 2021-09-27 DIAGNOSIS — I10 Essential (primary) hypertension: Secondary | ICD-10-CM

## 2021-09-27 DIAGNOSIS — E669 Obesity, unspecified: Secondary | ICD-10-CM

## 2021-09-27 LAB — COMPREHENSIVE METABOLIC PANEL
ALT: 17 U/L (ref 0–35)
AST: 15 U/L (ref 0–37)
Albumin: 4.4 g/dL (ref 3.5–5.2)
Alkaline Phosphatase: 55 U/L (ref 39–117)
BUN: 13 mg/dL (ref 6–23)
CO2: 28 mEq/L (ref 19–32)
Calcium: 9.7 mg/dL (ref 8.4–10.5)
Chloride: 101 mEq/L (ref 96–112)
Creatinine, Ser: 0.59 mg/dL (ref 0.40–1.20)
GFR: 105.63 mL/min (ref >60.00)
Glucose, Bld: 93 mg/dL (ref 70–99)
Potassium: 4.4 mEq/L (ref 3.5–5.1)
Sodium: 136 mEq/L (ref 135–145)
Total Bilirubin: 1 mg/dL (ref 0.2–1.2)
Total Protein: 7.2 g/dL (ref 6.0–8.3)

## 2021-09-27 LAB — CBC WITH DIFFERENTIAL/PLATELET
Basophils Absolute: 0 10*3/uL (ref 0.0–0.1)
Basophils Relative: 0.6 % (ref 0.0–3.0)
Eosinophils Absolute: 0.1 10*3/uL (ref 0.0–0.7)
Eosinophils Relative: 1.6 % (ref 0.0–5.0)
HCT: 41.9 % (ref 36.0–46.0)
Hemoglobin: 14.7 g/dL (ref 12.0–15.0)
Lymphocytes Relative: 29.4 % (ref 12.0–46.0)
Lymphs Abs: 1.1 10*3/uL (ref 0.7–4.0)
MCHC: 35 g/dL (ref 30.0–36.0)
MCV: 85.1 fl (ref 78.0–100.0)
Monocytes Absolute: 0.4 10*3/uL (ref 0.1–1.0)
Monocytes Relative: 9.6 % (ref 3.0–12.0)
Neutro Abs: 2.3 10*3/uL (ref 1.4–7.7)
Neutrophils Relative %: 58.8 % (ref 43.0–77.0)
Platelets: 235 10*3/uL (ref 150.0–400.0)
RBC: 4.93 Mil/uL (ref 3.87–5.11)
RDW: 13.1 % (ref 11.5–15.5)
WBC: 3.9 10*3/uL — ABNORMAL LOW (ref 4.0–10.5)

## 2021-09-27 LAB — LIPID PANEL
Cholesterol: 164 mg/dL (ref 0–200)
HDL: 62.1 mg/dL (ref >39.00)
LDL Cholesterol: 92 mg/dL (ref 0–99)
NonHDL: 102.35
Total CHOL/HDL Ratio: 3
Triglycerides: 52 mg/dL (ref 0.0–149.0)
VLDL: 10.4 mg/dL (ref 0.0–40.0)

## 2021-09-27 MED ORDER — TIZANIDINE HCL 4 MG PO TABS: 4.0000 mg | ORAL_TABLET | Freq: Four times a day (QID) | ORAL | 1 refills | Status: DC | PRN

## 2021-09-27 NOTE — Progress Notes (Signed)
Established Patient Office Visit  Subjective:  Patient ID: Jo Jackson, female    DOB: 1970-12-12  Age: 51 y.o. MRN: [id number]  CC:  Chief Complaint  Patient presents with   Hypertension   Follow-up    HPI Jo Jackson presents for f/u bp.  She has lost 11 lbs in a short period of time with diet and exercise.  She hopes to lose 40 lbs before it gets to hot.   She accidentally stopped her bp med but her bp is good today.    No complaints.  Past Medical History:  Diagnosis Date   Abnormal Pap smear    Repeats WNL   ADD (attention deficit disorder)    Anxiety    Arthritis    Oseteoarthritis   Depression    GERD (gastroesophageal reflux disease)    Headache(784.0)    Migraines   Junctional escape rhythm    Neuromuscular disorder (HCC)    Fibromyalgia   Pregnancy induced hypertension    Supraventricular tachycardia (HCC)     Past Surgical History:  Procedure Laterality Date   ABLATION OF DYSRHYTHMIC FOCUS     APPENDECTOMY  1986   CARPAL TUNNEL RELEASE  2000   CERVICAL FUSION  08-24-99,04-26-00,05-09-01   Knee sugery Left    16 yrs. old   SHOULDER SURGERY  left-6-04, right 09-12-04   tongue polyp  1984    Family History  Problem Relation Age of Onset   Migraines Mother    COPD Mother    Heart disease Mother        chf   Depression Mother    Hodgkin's lymphoma Father    ADD / ADHD Brother    Cancer Paternal Aunt        breast   Diabetes Other    Thyroid disease Other    Hypertension Other    Depression Other    Bipolar disorder Other     Social History   Socioeconomic History   Marital status: Widowed    Spouse name: Not on file   Number of children: 2   Years of education: Not on file   Highest education level: Not on file  Occupational History    Employer: [employer]    Comment: P/T   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    Employer: [employer]  Tobacco Use   Smoking status: Former    Types: Cigarettes    Quit date: 10/23/2007    Years  since quitting: 13.9   Smokeless tobacco: Never  Vaping Use   Vaping Use: Never used  Substance and Sexual Activity   Alcohol use: Yes    Alcohol/week: 0.0 standard drinks    Comment: Occ   Drug use: No   Sexual activity: Yes  Other Topics Concern   Not on file  Social History Narrative   G1p1      Exercise- no      Right handed   Social Determinants of Health   Financial Resource Strain: Not on file  Food Insecurity: Not on file  Transportation Needs: Not on file  Physical Activity: Not on file  Stress: Not on file  Social Connections: Not on file  Intimate Partner Violence: Not on file    Outpatient Medications Prior to Visit  Medication Sig Dispense Refill   albuterol (PROVENTIL) (2.5 MG/3ML) 0.083% nebulizer solution Take 3 mLs (2.5 mg total) by nebulization every 6 (six) hours as needed for wheezing or shortness of breath. 75 mL 12   albuterol (VENTOLIN  HFA) 108 (90 Base) MCG/ACT inhaler Inhale 2 puffs into the lungs every 6 (six) hours as needed for wheezing or shortness of breath. 18 g 5   amphetamine-dextroamphetamine (ADDERALL) 30 MG tablet Take 1 tablet by mouth 2 (two) times daily. 180 tablet 0   Erenumab-aooe (AIMOVIG) 140 MG/ML SOAJ Inject 140 mg into the skin every 28 (twenty-eight) days. 3 mL 1   fluticasone-salmeterol (ADVAIR DISKUS) 250-50 MCG/ACT AEPB Inhale 1 puff into the lungs in the morning and at bedtime. 180 each 1   HYDROcodone-acetaminophen (NORCO) 10-325 MG tablet Take 1 tablet by mouth every 6 (six) hours as needed. 30 tablet 0   INCASSIA 0.35 MG tablet TAKE 1 TABLET BY MOUTH EVERY DAY 84 tablet 1   meclizine (ANTIVERT) 25 MG tablet TAKE 1 TABLET BY MOUTH 3 TIMES DAILY AS NEEDED FOR DIZZINESS. 30 tablet 0   mometasone (NASONEX) 50 MCG/ACT nasal spray PLACE 2 SPRAYS INTO THE NOSE DAILY. 51 each 4   ondansetron (ZOFRAN-ODT) 8 MG disintegrating tablet TAKE 1 TABLET BY MOUTH EVERY 8 HOURS AS NEEDED FOR NAUSEA AND VOMITING 20 tablet 1   SUMAtriptan  (IMITREX) 100 MG tablet TAKE 1 TABLET (100 MG TOTAL) BY MOUTH EVERY 2 (TWO) HOURS AS NEEDED FOR MIGRAINE. MAY REPEAT IN 2 HOURS IF HEADACHE PERSISTS OR RECURS. 10 tablet 5   SUMAtriptan (IMITREX) 50 MG tablet Take by mouth.     traMADol (ULTRAM) 50 MG tablet Take 1 tablet (50 mg total) by mouth every 8 (eight) hours as needed. 30 tablet 0   TRINTELLIX 10 MG TABS tablet Take 30 mg by mouth daily at 2 PM.     Ubrogepant (UBRELVY) 50 MG TABS 1 po x1, may repeat after 2 hours 30 tablet 2   albuterol (VENTOLIN HFA) 108 (90 Base) MCG/ACT inhaler TAKE 2 PUFFS BY MOUTH EVERY 6 HOURS AS NEEDED FOR WHEEZE OR SHORTNESS OF BREATH 18 g 3   metoprolol succinate (TOPROL-XL) 50 MG 24 hr tablet Take 1 tablet (50 mg total) by mouth daily. Take with or immediately following a meal. 90 tablet 3   pantoprazole (PROTONIX) 40 MG tablet TAKE 1 TABLET BY MOUTH EVERY DAY 90 tablet 3   predniSONE (DELTASONE) 10 MG tablet TAKE 3 TABLETS PO QD FOR 3 DAYS THEN TAKE 2 TABLETS PO QD FOR 3 DAYS THEN TAKE 1 TABLET PO QD FOR 3 DAYS THEN TAKE 1/2 TAB PO QD FOR 3 DAYS 20 tablet 0   rizatriptan (MAXALT-MLT) 10 MG disintegrating tablet Take 1 tablet (10 mg total) by mouth as needed for migraine (May repeat after 2 hours.  Maximum 2 tablets in 24 hours). May repeat in 2 hours if needed 30 tablet 0   tiZANidine (ZANAFLEX) 4 MG tablet TAKE 1 TABLET BY MOUTH EVERY 6 HOURS AS NEEDED FOR MUSCLE SPASMS. 90 tablet 1   No facility-administered medications prior to visit.    Allergies  Allergen Reactions   Ketorolac Tromethamine Hives   Morphine Other (See Comments)    "makes me the devil."   Latex Rash and Other (See Comments)    Latex catheter caused extreme irritation    ROS Review of Systems  Constitutional:  Negative for appetite change, diaphoresis, fatigue and unexpected weight change.  Eyes:  Negative for pain, redness and visual disturbance.  Respiratory:  Negative for cough, chest tightness, shortness of breath and wheezing.    Cardiovascular:  Negative for chest pain, palpitations and leg swelling.  Endocrine: Negative for cold intolerance, heat intolerance, polydipsia, polyphagia  and polyuria.  Genitourinary:  Negative for difficulty urinating, dysuria and frequency.  Neurological:  Negative for dizziness, light-headedness, numbness and headaches.     Objective:    Physical Exam Vitals and nursing note reviewed.  Constitutional:      Appearance: She is well-developed.  HENT:     Head: Normocephalic and atraumatic.  Eyes:     Conjunctiva/sclera: Conjunctivae normal.  Neck:     Thyroid: No thyromegaly.     Vascular: No carotid bruit or JVD.  Cardiovascular:     Rate and Rhythm: Normal rate and regular rhythm.     Heart sounds: Normal heart sounds. No murmur heard. Pulmonary:     Effort: Pulmonary effort is normal. No respiratory distress.     Breath sounds: Normal breath sounds. No wheezing or rales.  Chest:     Chest wall: No tenderness.  Musculoskeletal:     Cervical back: Normal range of motion and neck supple.  Neurological:     Mental Status: She is alert and oriented to person, place, and time.    BP 114/80 (BP Location: Right Arm, Patient Position: Sitting, Cuff Size: Large)    Pulse 79    Temp 98.5 F (36.9 C) (Oral)    Resp 18    Ht 5\' 7"  (1.702 m)    Wt 280 lb (127 kg)    SpO2 97%    BMI 43.85 kg/m  Wt Readings from Last 3 Encounters:  09/27/21 280 lb (127 kg)  07/19/21 283 lb (128.4 kg)  06/07/21 287 lb 14.4 oz (130.6 kg)     Health Maintenance Due  Topic Date Due   Hepatitis C Screening  Never done   COLONOSCOPY (Pts 45-261yrs Insurance coverage will need to be confirmed)  Never done   PAP SMEAR-Modifier  02/13/2020   COVID-19 Vaccine (5 - Booster for Janssen series) 05/27/2021    There are no preventive care reminders to display for this patient.  Lab Results  Component Value Date   TSH 1.67 12/21/2020   Lab Results  Component Value Date   WBC 7.9 06/01/2021   HGB  13.6 06/01/2021   HCT 39.9 06/01/2021   MCV 92.6 06/01/2021   PLT 253.0 06/01/2021   Lab Results  Component Value Date   NA 136 06/01/2021   K 3.9 06/01/2021   CO2 26 06/01/2021   GLUCOSE 135 (H) 06/01/2021   BUN 21 06/01/2021   CREATININE 0.75 06/01/2021   BILITOT 0.6 06/01/2021   ALKPHOS 74 06/01/2021   AST 17 06/01/2021   ALT 18 06/01/2021   PROT 6.8 06/01/2021   ALBUMIN 3.8 06/01/2021   CALCIUM 9.1 06/01/2021   GFR 93.52 06/01/2021   Lab Results  Component Value Date   CHOL 140 12/21/2020   Lab Results  Component Value Date   HDL 47.80 12/21/2020   Lab Results  Component Value Date   LDLCALC 80 12/21/2020   Lab Results  Component Value Date   TRIG 59.0 12/21/2020   Lab Results  Component Value Date   CHOLHDL 3 12/21/2020   Lab Results  Component Value Date   HGBA1C 5.4 06/06/2013      Assessment & Plan:   Problem List Items Addressed This Visit       Unprioritized   Muscle spasm   Relevant Medications   tiZANidine (ZANAFLEX) 4 MG tablet   GERD    On pepcid otc prn controlled      Major depressive disorder, recurrent episode, in full remission (HCC)  In remission      Migraine without aura    Per neuro      Relevant Medications   tiZANidine (ZANAFLEX) 4 MG tablet   Morbid obesity (HCC) - Primary   Relevant Orders   CBC with Differential/Platelet   Comprehensive metabolic panel   Lipid panel   Insulin, random   Obesity (BMI 30-39.9)    Cont diet and exercise      Primary hypertension    Pt off med for a few months bp good off meds Well controlled, . Encouraged heart healthy diet such as the DASH diet and exercise as tolerated.        Meds ordered this encounter  Medications   tiZANidine (ZANAFLEX) 4 MG tablet    Sig: Take 1 tablet (4 mg total) by mouth every 6 (six) hours as needed for muscle spasms.    Dispense:  90 tablet    Refill:  1    Follow-up: Return in about 6 months (around 03/30/2022), or if symptoms worsen  or fail to improve, for annual exam, fasting.    [name], DO

## 2021-09-27 NOTE — Assessment & Plan Note (Signed)
In remission.

## 2021-09-27 NOTE — Assessment & Plan Note (Signed)
Pt off med for a few months ?bp good off meds ?Well controlled, . Encouraged heart healthy diet such as the DASH diet and exercise as tolerated.  ?

## 2021-09-27 NOTE — Assessment & Plan Note (Signed)
Con't diet and exercise  

## 2021-09-27 NOTE — Assessment & Plan Note (Signed)
On pepcid otc prn ?controlled ?

## 2021-09-27 NOTE — Assessment & Plan Note (Signed)
Per neuro 

## 2021-09-27 NOTE — Patient Instructions (Signed)

## 2021-09-28 LAB — INSULIN, RANDOM: Insulin: 5.9 u[IU]/mL

## 2021-09-30 ENCOUNTER — Encounter: Payer: Self-pay | Admitting: Family Medicine

## 2021-10-04 ENCOUNTER — Encounter: Payer: Self-pay | Admitting: Family Medicine

## 2021-10-04 ENCOUNTER — Other Ambulatory Visit: Payer: Self-pay | Admitting: Family Medicine

## 2021-10-04 MED ORDER — PREDNISONE 10 MG PO TABS: ORAL_TABLET | ORAL | 0 refills | Status: DC

## 2021-10-05 NOTE — Telephone Encounter (Signed)
error 

## 2021-10-10 ENCOUNTER — Encounter: Payer: Self-pay | Admitting: Family Medicine

## 2021-10-10 DIAGNOSIS — F3342 Major depressive disorder, recurrent, in full remission: Secondary | ICD-10-CM

## 2021-10-14 ENCOUNTER — Other Ambulatory Visit: Payer: Self-pay | Admitting: Family Medicine

## 2021-10-14 DIAGNOSIS — K219 Gastro-esophageal reflux disease without esophagitis: Secondary | ICD-10-CM

## 2021-11-01 ENCOUNTER — Encounter: Payer: Self-pay | Admitting: Family Medicine

## 2021-11-03 ENCOUNTER — Ambulatory Visit: Payer: BC Managed Care – PPO | Admitting: Family Medicine

## 2021-11-03 ENCOUNTER — Encounter: Payer: Self-pay | Admitting: Family Medicine

## 2021-11-03 VITALS — BP 126/84 | HR 104 | Temp 98.6°F | Resp 18 | Ht 67.0 in | Wt 287.6 lb

## 2021-11-03 DIAGNOSIS — L918 Other hypertrophic disorders of the skin: Secondary | ICD-10-CM | POA: Diagnosis not present

## 2021-11-03 MED ORDER — CEPHALEXIN 500 MG PO CAPS: 500.0000 mg | ORAL_CAPSULE | Freq: Two times a day (BID) | ORAL | 0 refills | Status: DC

## 2021-11-03 NOTE — Patient Instructions (Signed)
Skin Tag, Adult  A skin tag (acrochordon) is a soft, extra growth of skin. Most skin tags are skin-colored and rarely bigger than a pencil eraser. They commonly form in areas where there is frequent rubbing, or friction, on the skin. This may be where there are folds in the skin, such as the eyelids, neck, armpit, or groin. Skin tags are not dangerous, and they do not spread from person to person (are not contagious). You may have one skin tag or several. Skin tags do not require treatment. However, your health care provider may recommend removal of a skin tag if it: Gets irritated from clothing or jewelry. Bleeds. Is visible and unsightly. What are the causes? This condition is linked with: Increasing age. Pregnancy. Diabetes. Obesity. What are the signs or symptoms? Skin tags usually do not cause symptoms unless they get irritated by items touching your skin, such as clothing or jewelry. When this happens, you may have pain, itching, or bleeding. How is this diagnosed? This condition is diagnosed with an evaluation from your health care provider. No testing is needed for diagnosis. How is this treated? Treatment for this condition depends on whether you have symptoms. If a skin tag needs to be removed, your health care provider can remove it with: A simple surgical procedure using scissors. A procedure that involves freezing your skin tag with a gas in liquid form (liquid nitrogen). A procedure that uses heat to destroy your skin tag (electrodessication). Your health care provider may also remove your skin tag if it is visible or unsightly, Follow these instructions at home: Watch for any changes in your skin tag. A normal skin tag does not require any other special care at home. Take over-the-counter and prescription medicines only as told by your health care provider. Keep all follow-up visits as told by your health care provider. This is important. Contact a health care provider  if: You have a skin tag that: Becomes painful. Changes color. Bleeds. Swells. Summary Skin tags are soft, extra growths of skin found in areas of frequent rubbing or friction. Skin tags usually do not cause symptoms. If symptoms occur, you may have pain, itching, or bleeding. If your skin tag causes symptoms or is unsightly, your health care provider can remove it. This information is not intended to replace advice given to you by your health care provider. Make sure you discuss any questions you have with your health care provider. Document Revised: 05/12/2019 Document Reviewed: 05/12/2019 Elsevier Patient Education  2022 [employer]vier Inc.  

## 2021-11-03 NOTE — Assessment & Plan Note (Signed)
Keep area dry 24h then she can shower like usual  ?

## 2021-11-03 NOTE — Progress Notes (Signed)
? ?Subjective:  ? ?By signing my name below, I, Jo Jackson, attest that this documentation has been prepared under the direction and in the presence of Jo Schultz, DO. 11/03/2021 ? ? ? Patient ID: Jo Jackson, female    DOB: 10-28-1970, 51 y.o.   MRN: [id number] ? ?Chief Complaint  ?Patient presents with  ? Skin Tag  ?  Under the left arm, Pt states no bleeding or discharge.  ? ? ?HPI ?Patient is in today for a multiple skin tag removal procedure.  ? ?She signed the written consent to the procedure.  ?She reports having an red and tender skin tag under her left under arm.  ? ? ?Past Medical History:  ?Diagnosis Date  ? Abnormal Pap smear   ? Repeats WNL  ? ADD (attention deficit disorder)   ? Anxiety   ? Arthritis   ? Oseteoarthritis  ? Depression   ? GERD (gastroesophageal reflux disease)   ? Headache(784.0)   ? Migraines  ? Junctional escape rhythm   ? Neuromuscular disorder (HCC)   ? Fibromyalgia  ? Pregnancy induced hypertension   ? Supraventricular tachycardia (HCC)   ? ? ?Past Surgical History:  ?Procedure Laterality Date  ? ABLATION OF DYSRHYTHMIC FOCUS    ? APPENDECTOMY  1986  ? CARPAL TUNNEL RELEASE  2000  ? CERVICAL FUSION  08-24-99,04-26-00,05-09-01  ? Knee sugery Left   ? 16 yrs. old  ? SHOULDER SURGERY  left-6-04, right 09-12-04  ? tongue polyp  1984  ? ? ?Family History  ?Problem Relation Age of Onset  ? Migraines Mother   ? COPD Mother   ? Heart disease Mother   ?     chf  ? Depression Mother   ? Hodgkin's lymphoma Father   ? ADD / ADHD Brother   ? Cancer Paternal Aunt   ?     breast  ? Diabetes Other   ? Thyroid disease Other   ? Hypertension Other   ? Depression Other   ? Bipolar disorder Other   ? ? ?Social History  ? ?Socioeconomic History  ? Marital status: Widowed  ?  Spouse name: Not on file  ? Number of children: 2  ? Years of education: Not on file  ? Highest education level: Not on file  ?Occupational History  ?  Employer: [employer]  ?  Comment: P/T  ? Occupation: SMALL SORT  SORTER  ?  Employer: [employer]  ?Tobacco Use  ? Smoking status: Former  ?  Types: Cigarettes  ?  Quit date: 10/23/2007  ?  Years since quitting: 14.0  ? Smokeless tobacco: Never  ?Vaping Use  ? Vaping Use: Never used  ?Substance and Sexual Activity  ? Alcohol use: Yes  ?  Alcohol/week: 0.0 standard drinks  ?  Comment: Occ  ? Drug use: No  ? Sexual activity: Yes  ?Other Topics Concern  ? Not on file  ?Social History Narrative  ? G1p1  ?   ? Exercise- no  ?   ? Right handed  ? ?Social Determinants of Health  ? ?Financial Resource Strain: Not on file  ?Food Insecurity: Not on file  ?Transportation Needs: Not on file  ?Physical Activity: Not on file  ?Stress: Not on file  ?Social Connections: Not on file  ?Intimate Partner Violence: Not on file  ? ? ?Outpatient Medications Prior to Visit  ?Medication Sig Dispense Refill  ? albuterol (PROVENTIL) (2.5 MG/3ML) 0.083% nebulizer solution Take 3 mLs (2.5 mg  total) by nebulization every 6 (six) hours as needed for wheezing or shortness of breath. 75 mL 12  ? albuterol (VENTOLIN HFA) 108 (90 Base) MCG/ACT inhaler Inhale 2 puffs into the lungs every 6 (six) hours as needed for wheezing or shortness of breath. 18 g 5  ? amphetamine-dextroamphetamine (ADDERALL) 30 MG tablet Take 1 tablet by mouth 2 (two) times daily. 180 tablet 0  ? Erenumab-aooe (AIMOVIG) 140 MG/ML SOAJ Inject 140 mg into the skin every 28 (twenty-eight) days. 3 mL 1  ? fluticasone-salmeterol (ADVAIR DISKUS) 250-50 MCG/ACT AEPB Inhale 1 puff into the lungs in the morning and at bedtime. 180 each 1  ? HYDROcodone-acetaminophen (NORCO) 10-325 MG tablet Take 1 tablet by mouth every 6 (six) hours as needed. 30 tablet 0  ? INCASSIA 0.35 MG tablet TAKE 1 TABLET BY MOUTH EVERY DAY 84 tablet 1  ? meclizine (ANTIVERT) 25 MG tablet TAKE 1 TABLET BY MOUTH 3 TIMES DAILY AS NEEDED FOR DIZZINESS. 30 tablet 0  ? mometasone (NASONEX) 50 MCG/ACT nasal spray PLACE 2 SPRAYS INTO THE NOSE DAILY. 51 each 4  ? ondansetron  (ZOFRAN-ODT) 8 MG disintegrating tablet TAKE 1 TABLET BY MOUTH EVERY 8 HOURS AS NEEDED FOR NAUSEA AND VOMITING 20 tablet 1  ? predniSONE (DELTASONE) 10 MG tablet TAKE 3 TABLETS PO QD FOR 3 DAYS THEN TAKE 2 TABLETS PO QD FOR 3 DAYS THEN TAKE 1 TABLET PO QD FOR 3 DAYS THEN TAKE 1/2 TAB PO QD FOR 3 DAYS 20 tablet 0  ? SUMAtriptan (IMITREX) 100 MG tablet TAKE 1 TABLET (100 MG TOTAL) BY MOUTH EVERY 2 (TWO) HOURS AS NEEDED FOR MIGRAINE. MAY REPEAT IN 2 HOURS IF HEADACHE PERSISTS OR RECURS. 10 tablet 5  ? SUMAtriptan (IMITREX) 50 MG tablet Take by mouth.    ? tiZANidine (ZANAFLEX) 4 MG tablet Take 1 tablet (4 mg total) by mouth every 6 (six) hours as needed for muscle spasms. 90 tablet 1  ? traMADol (ULTRAM) 50 MG tablet Take 1 tablet (50 mg total) by mouth every 8 (eight) hours as needed. 30 tablet 0  ? TRINTELLIX 10 MG TABS tablet Take 30 mg by mouth daily at 2 PM.    ? Ubrogepant (UBRELVY) 50 MG TABS 1 po x1, may repeat after 2 hours 30 tablet 2  ? ?No facility-administered medications prior to visit.  ? ? ?Allergies  ?Allergen Reactions  ? Ketorolac Tromethamine Hives  ? Morphine Other (See Comments)  ?  "makes me the devil."  ? Latex Rash and Other (See Comments)  ?  Latex catheter caused extreme irritation  ? ? ?Review of Systems  ?Skin:   ?     (+)red and tender skin tag under left arm  ?2 skin tags under R arm  ?   ?Objective:  ?  ?Physical Exam ?Skin: ?   Comments: Patient cleaned, prepped and draped. Sterile scissors and forceps used to cut off 2 skin tags in right axilla. There is no bleeding and bandaged was placed. ?Left axilla had red, inflamed and tender skin tag which was not removed ?  ? ? ?BP 126/84 (BP Location: Left Arm, Patient Position: Sitting, Cuff Size: Large)   Pulse (!) 104   Temp 98.6 ?F (37 ?C) (Oral)   Resp 18   Ht 5\' 7"  (1.702 m)   Wt 287 lb 9.6 oz (130.5 kg)   SpO2 98%   BMI 45.04 kg/m?  ?Wt Readings from Last 3 Encounters:  ?11/03/21 287 lb 9.6 oz (130.5  kg)  ?09/27/21 280 lb (127  kg)  ?07/19/21 283 lb (128.4 kg)  ? ? ?Diabetic Foot Exam - Simple   ?No data filed ?  ? ?Lab Results  ?Component Value Date  ? WBC 3.9 (L) 09/27/2021  ? HGB 14.7 09/27/2021  ? HCT 41.9 09/27/2021  ? PLT 235.0 09/27/2021  ? GLUCOSE 93 09/27/2021  ? CHOL 164 09/27/2021  ? TRIG 52.0 09/27/2021  ? HDL 62.10 09/27/2021  ? LDLCALC 92 09/27/2021  ? ALT 17 09/27/2021  ? AST 15 09/27/2021  ? NA 136 09/27/2021  ? K 4.4 09/27/2021  ? CL 101 09/27/2021  ? CREATININE 0.59 09/27/2021  ? BUN 13 09/27/2021  ? CO2 28 09/27/2021  ? TSH 1.67 12/21/2020  ? HGBA1C 5.4 06/06/2013  ? ? ?Lab Results  ?Component Value Date  ? TSH 1.67 12/21/2020  ? ?Lab Results  ?Component Value Date  ? WBC 3.9 (L) 09/27/2021  ? HGB 14.7 09/27/2021  ? HCT 41.9 09/27/2021  ? MCV 85.1 09/27/2021  ? PLT 235.0 09/27/2021  ? ?Lab Results  ?Component Value Date  ? NA 136 09/27/2021  ? K 4.4 09/27/2021  ? CO2 28 09/27/2021  ? GLUCOSE 93 09/27/2021  ? BUN 13 09/27/2021  ? CREATININE 0.59 09/27/2021  ? BILITOT 1.0 09/27/2021  ? ALKPHOS 55 09/27/2021  ? AST 15 09/27/2021  ? ALT 17 09/27/2021  ? PROT 7.2 09/27/2021  ? ALBUMIN 4.4 09/27/2021  ? CALCIUM 9.7 09/27/2021  ? GFR 105.63 09/27/2021  ? ?Lab Results  ?Component Value Date  ? CHOL 164 09/27/2021  ? ?Lab Results  ?Component Value Date  ? HDL 62.10 09/27/2021  ? ?Lab Results  ?Component Value Date  ? LDLCALC 92 09/27/2021  ? ?Lab Results  ?Component Value Date  ? TRIG 52.0 09/27/2021  ? ?Lab Results  ?Component Value Date  ? CHOLHDL 3 09/27/2021  ? ?Lab Results  ?Component Value Date  ? HGBA1C 5.4 06/06/2013  ? ? ?   ?Assessment & Plan:  ? ?Problem List Items Addressed This Visit   ? ?  ? Unprioritized  ? Skin tag - Primary  ?  Keep area dry 24h then she can shower like usual  ?  ?  ? Inflamed skin tag  ? Relevant Medications  ? cephALEXin (KEFLEX) 500 MG capsule  ? ? ? ?Meds ordered this encounter  ?Medications  ? cephALEXin (KEFLEX) 500 MG capsule  ?  Sig: Take 1 capsule (500 mg total) by mouth 2 (two) times  daily.  ?  Dispense:  20 capsule  ?  Refill:  0  ? ? ?I, Jo SchultzYvonne R Lowne Chase, DO, personally preformed the services described in this documentation.  All medical record entries made by the scribe were at my direction and

## 2021-11-09 ENCOUNTER — Other Ambulatory Visit: Payer: Self-pay | Admitting: Family Medicine

## 2021-11-09 DIAGNOSIS — R110 Nausea: Secondary | ICD-10-CM

## 2021-11-09 DIAGNOSIS — G43809 Other migraine, not intractable, without status migrainosus: Secondary | ICD-10-CM

## 2021-11-09 DIAGNOSIS — J111 Influenza due to unidentified influenza virus with other respiratory manifestations: Secondary | ICD-10-CM

## 2021-11-09 DIAGNOSIS — I10 Essential (primary) hypertension: Secondary | ICD-10-CM

## 2021-11-09 DIAGNOSIS — M62838 Other muscle spasm: Secondary | ICD-10-CM

## 2021-11-23 ENCOUNTER — Telehealth: Payer: Self-pay

## 2021-11-23 MED ORDER — AIMOVIG 140 MG/ML ~~LOC~~ SOAJ: 140.0000 mg | SUBCUTANEOUS | 1 refills | Status: DC

## 2021-11-24 ENCOUNTER — Other Ambulatory Visit (HOSPITAL_COMMUNITY): Payer: Self-pay

## 2021-11-24 ENCOUNTER — Telehealth: Payer: Self-pay | Admitting: Pharmacy Technician

## 2021-11-24 NOTE — Telephone Encounter (Signed)
Patient Advocate Encounter ?  ?Received notification that prior authorization for Aimovig 140MG /ML auto-injectors is required for a 90 day supply. ?  ?PA submitted on 11/24/2021 ?Key [id number] ?Status is pending ?   ? ? ?01/24/2022, CPhT-Adv ?Pharmacy Patient Advocate Specialist ?[facility] Patient Advocate Team ?Direct Number: [phone]  Fax: [phone] ? ?

## 2021-11-25 NOTE — Telephone Encounter (Signed)
Patient Advocate Encounter ? ?Received notification that the request for prior authorization for Aimovig 140MG /ML auto-injectors has been denied due to will not approve a 90 day supply.   ?  ? ? ? , CPhT ?Pharmacy Patient Advocate Specialist ?[facility] Patient Advocate Team ?Direct Number: [phone]  Fax: [phone]  ?

## 2021-11-29 MED ORDER — AIMOVIG 140 MG/ML ~~LOC~~ SOAJ: 140.0000 mg | SUBCUTANEOUS | 1 refills | Status: DC

## 2021-11-29 NOTE — Telephone Encounter (Signed)
Letter written. LMOVM for patient.  ?

## 2021-11-29 NOTE — Telephone Encounter (Signed)
Came down with a migraine yesterday and took Imitrex and it only helped some. Today, the migraine has come back with a vengeance she said. She said she's been without her Aimovig for about a month due to her insurance. ?Patient requesting a letter excusing her from work today. ? ?

## 2021-12-02 ENCOUNTER — Telehealth: Payer: Self-pay

## 2021-12-02 ENCOUNTER — Other Ambulatory Visit: Payer: Self-pay | Admitting: Family Medicine

## 2021-12-02 ENCOUNTER — Encounter: Payer: Self-pay | Admitting: Family Medicine

## 2021-12-02 DIAGNOSIS — M797 Fibromyalgia: Secondary | ICD-10-CM

## 2021-12-02 DIAGNOSIS — G43809 Other migraine, not intractable, without status migrainosus: Secondary | ICD-10-CM

## 2021-12-02 MED ORDER — TRAMADOL HCL 50 MG PO TABS: 50.0000 mg | ORAL_TABLET | Freq: Three times a day (TID) | ORAL | 0 refills | Status: DC | PRN

## 2021-12-02 MED ORDER — SUMATRIPTAN SUCCINATE 100 MG PO TABS: ORAL_TABLET | ORAL | 5 refills | Status: DC

## 2021-12-02 MED ORDER — HYDROCODONE-ACETAMINOPHEN 10-325 MG PO TABS: 1.0000 | ORAL_TABLET | Freq: Four times a day (QID) | ORAL | 0 refills | Status: DC | PRN

## 2021-12-02 NOTE — Telephone Encounter (Signed)
Looks like NORCO and Tramadol was sent in today. Please advise ?

## 2021-12-02 NOTE — Telephone Encounter (Signed)
Spoke with [facility] and they advised they accidentally deleted the [facility] and they need more direction on how and when the patient takes the medications since both meds are immediate release.  ?

## 2021-12-02 NOTE — Telephone Encounter (Signed)
Received call from Pt- [facility] at [facility] will not fill both tramadol 50mg  and hydrocodone together. Pt wants to know what can be done so she can get these.  ?

## 2021-12-02 NOTE — Telephone Encounter (Signed)
Requesting:tramadol 50mg  and hydrocodone 10-325mg  ?Contract: 03/24/21 ?UDS: 03/24/21 ?Last Visit: 11/03/21 ?Next Visit: 04/03/22 ?Last Refill on hydrocodone: 08/08/21 #30 and 0RF ?Last Refill on tramadol: 08/08/21 #30 and 0RF ? ?Please Advise ? ?

## 2021-12-02 NOTE — Telephone Encounter (Signed)
[facility] called stating that they are not comfortable with filling two controlled substances together and want feedback how to proceed. Please Advise. ?

## 2021-12-05 ENCOUNTER — Encounter: Payer: Self-pay | Admitting: Family Medicine

## 2021-12-05 MED ORDER — SUMATRIPTAN SUCCINATE 50 MG PO TABS
50.0000 mg | ORAL_TABLET | ORAL | 0 refills | Status: DC | PRN
Start: 2021-12-05 — End: 2022-03-16

## 2021-12-05 MED ORDER — HYDROCODONE-ACETAMINOPHEN 10-325 MG PO TABS: 1.0000 | ORAL_TABLET | Freq: Four times a day (QID) | ORAL | 0 refills | Status: DC | PRN

## 2021-12-05 NOTE — Telephone Encounter (Signed)
Okay to send in the 50 MG? ?

## 2021-12-08 ENCOUNTER — Ambulatory Visit (HOSPITAL_COMMUNITY): Payer: BC Managed Care – PPO | Admitting: Psychiatry

## 2021-12-08 NOTE — Telephone Encounter (Signed)
VM left for patient to call back.

## 2021-12-16 ENCOUNTER — Telehealth: Payer: Self-pay | Admitting: Family Medicine

## 2021-12-16 NOTE — Telephone Encounter (Addendum)
Pt states pharmacy is still harassing her and would like to speak with lowne regarding rx   Pt would like all rx's sent to:   [id number] [place], [place] - [address]  [address], [place]  [id number]

## 2021-12-20 ENCOUNTER — Other Ambulatory Visit: Payer: Self-pay

## 2021-12-20 ENCOUNTER — Other Ambulatory Visit: Payer: Self-pay | Admitting: Family Medicine

## 2021-12-20 DIAGNOSIS — M797 Fibromyalgia: Secondary | ICD-10-CM

## 2021-12-20 NOTE — Telephone Encounter (Signed)
Patient is needing just her hydrocodone, she last got a 7 day supply.  Requesting: hydrocodone Contract: 03/24/21 UDS: 03/24/21 Last Visit: 11/03/21 Next Visit: 04/03/22 Last Refill: 12/05/21  Please Advise

## 2021-12-22 ENCOUNTER — Other Ambulatory Visit: Payer: Self-pay | Admitting: Family Medicine

## 2021-12-22 NOTE — Telephone Encounter (Signed)
Pt needs hydrocodone refilled.  Can you send in?

## 2022-01-03 ENCOUNTER — Ambulatory Visit: Payer: BC Managed Care – PPO | Admitting: Family Medicine

## 2022-01-03 ENCOUNTER — Other Ambulatory Visit: Payer: Self-pay

## 2022-01-03 ENCOUNTER — Encounter: Payer: Self-pay | Admitting: Family Medicine

## 2022-01-03 VITALS — BP 132/80 | HR 96 | Temp 98.6°F | Resp 18 | Ht 67.0 in | Wt 295.2 lb

## 2022-01-03 DIAGNOSIS — G43009 Migraine without aura, not intractable, without status migrainosus: Secondary | ICD-10-CM

## 2022-01-03 DIAGNOSIS — M542 Cervicalgia: Secondary | ICD-10-CM

## 2022-01-03 DIAGNOSIS — M797 Fibromyalgia: Secondary | ICD-10-CM

## 2022-01-03 MED ORDER — HYDROCODONE-ACETAMINOPHEN 10-325 MG PO TABS: 1.0000 | ORAL_TABLET | Freq: Four times a day (QID) | ORAL | 0 refills | Status: DC | PRN

## 2022-01-03 MED ORDER — METHYLPREDNISOLONE ACETATE 80 MG/ML IJ SUSP
80.0000 mg | Freq: Once | INTRAMUSCULAR | Status: AC
  Administered 2022-01-03: 80 mg via INTRAMUSCULAR

## 2022-01-03 MED ORDER — PREDNISONE 10 MG PO TABS: ORAL_TABLET | ORAL | 0 refills | Status: DC

## 2022-01-03 NOTE — Assessment & Plan Note (Signed)
?   Secondary to cervical radiculopathy pred taper , depo medrol 80  F/u ortho

## 2022-01-03 NOTE — Progress Notes (Signed)
Subjective:   By signing my name below, I, [name], attest that this documentation has been prepared under the direction and in the presence of [name], DO  01/03/2022    Patient ID: Jo Jackson, female    DOB: [birth year], 51 y.o.   MRN: [id number]  Chief Complaint  Patient presents with   Migraine    X3 days, Pt states having taking medication w/o relief.     Migraine  Associated symptoms include eye pain (right eye pain radiating from neck pain) and neck pain. Pertinent negatives include no back pain, blurred vision, coughing, fever or vomiting.   Patient is in today for a office visit.   She complains of a neck pain for the past 3 days. Her pain starts in the back of her neck and radiates to her right eye.  She has tried Toradol to manage similar pain in the past and found she broke out in hives. She is taking 50 mg tramadol and Imitrex to manage her symptoms and finds no relief. She reports since switching to her new pharmacy, they have only filled her prescription for 7 days.    Past Medical History:  Diagnosis Date   Abnormal Pap smear    Repeats WNL   ADD (attention deficit disorder)    Anxiety    Arthritis    Oseteoarthritis   Depression    GERD (gastroesophageal reflux disease)    Headache(784.0)    Migraines   Junctional escape rhythm    Neuromuscular disorder (HCC)    Fibromyalgia   Pregnancy induced hypertension    Supraventricular tachycardia (HCC)     Past Surgical History:  Procedure Laterality Date   ABLATION OF DYSRHYTHMIC FOCUS     APPENDECTOMY  1986   CARPAL TUNNEL RELEASE  2000   CERVICAL FUSION  08-24-99,04-26-00,05-09-01   Knee sugery Left    16 yrs. old   SHOULDER SURGERY  left-6-04, right 09-12-04   tongue polyp  1984    Family History  Problem Relation Age of Onset   Migraines Mother    COPD Mother    Heart disease Mother        chf   Depression Mother    Hodgkin's lymphoma Father    ADD / ADHD Brother     Cancer Paternal Aunt        breast   Diabetes Other    Thyroid disease Other    Hypertension Other    Depression Other    Bipolar disorder Other     Social History   Socioeconomic History   Marital status: Widowed    Spouse name: Not on file   Number of children: 2   Years of education: Not on file   Highest education level: Not on file  Occupational History    Employer: [employer]    Comment: P/T   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    Employer: [employer]  Tobacco Use   Smoking status: Former    Types: Cigarettes    Quit date: 10/23/2007    Years since quitting: 14.2   Smokeless tobacco: Never  Vaping Use   Vaping Use: Never used  Substance and Sexual Activity   Alcohol use: Yes    Alcohol/week: 0.0 standard drinks of alcohol    Comment: Occ   Drug use: No   Sexual activity: Yes  Other Topics Concern   Not on file  Social History Narrative   G1p1      Exercise- no  Right handed   Social Determinants of Health   Financial Resource Strain: Not on file  Food Insecurity: Not on file  Transportation Needs: Not on file  Physical Activity: Not on file  Stress: Not on file  Social Connections: Not on file  Intimate Partner Violence: Not on file    Outpatient Medications Prior to Visit  Medication Sig Dispense Refill   albuterol (PROVENTIL) (2.5 MG/3ML) 0.083% nebulizer solution Take 3 mLs (2.5 mg total) by nebulization every 6 (six) hours as needed for wheezing or shortness of breath. 75 mL 12   albuterol (VENTOLIN HFA) 108 (90 Base) MCG/ACT inhaler TAKE 2 PUFFS BY MOUTH EVERY 6 HOURS AS NEEDED FOR WHEEZE OR SHORTNESS OF BREATH 18 g 5   amphetamine-dextroamphetamine (ADDERALL) 30 MG tablet Take 1 tablet by mouth 2 (two) times daily. 180 tablet 0   Erenumab-aooe (AIMOVIG) 140 MG/ML SOAJ Inject 140 mg into the skin every 28 (twenty-eight) days. 1 mL 1   fluticasone-salmeterol (ADVAIR DISKUS) 250-50 MCG/ACT AEPB Inhale 1 puff into the lungs in the morning and at  bedtime. 180 each 1   INCASSIA 0.35 MG tablet TAKE 1 TABLET BY MOUTH EVERY DAY 84 tablet 1   meclizine (ANTIVERT) 25 MG tablet TAKE 1 TABLET BY MOUTH 3 TIMES DAILY AS NEEDED FOR DIZZINESS. 30 tablet 0   mometasone (NASONEX) 50 MCG/ACT nasal spray PLACE 2 SPRAYS INTO THE NOSE DAILY. 51 each 4   ondansetron (ZOFRAN-ODT) 8 MG disintegrating tablet DISSOLVE 1 TABLET ON TONGUE EVERY 8 HOURS AS NEEDED FOR NAUSEA AND VOMITING 20 tablet 1   SUMAtriptan (IMITREX) 100 MG tablet TAKE 1 TABLET (100 MG TOTAL) BY MOUTH EVERY 2 (TWO) HOURS AS NEEDED FOR MIGRAINE. MAY REPEAT IN 2 HOURS IF HEADACHE PERSISTS OR RECURS. 10 tablet 5   SUMAtriptan (IMITREX) 50 MG tablet Take 1 tablet (50 mg total) by mouth every 2 (two) hours as needed for migraine. May repeat in 2 hours if headache persists or recurs. 10 tablet 0   tiZANidine (ZANAFLEX) 4 MG tablet Take 1 tablet (4 mg total) by mouth every 6 (six) hours as needed for muscle spasms. 90 tablet 1   traMADol (ULTRAM) 50 MG tablet Take 1 tablet (50 mg total) by mouth every 8 (eight) hours as needed. 30 tablet 0   TRINTELLIX 10 MG TABS tablet Take 30 mg by mouth daily at 2 PM.     Ubrogepant (UBRELVY) 50 MG TABS 1 po x1, may repeat after 2 hours 30 tablet 2   cephALEXin (KEFLEX) 500 MG capsule Take 1 capsule (500 mg total) by mouth 2 (two) times daily. 20 capsule 0   HYDROcodone-acetaminophen (NORCO) 10-325 MG tablet Take 1 tablet by mouth every 6 (six) hours as needed. 30 tablet 0   predniSONE (DELTASONE) 10 MG tablet TAKE 3 TABLETS PO QD FOR 3 DAYS THEN TAKE 2 TABLETS PO QD FOR 3 DAYS THEN TAKE 1 TABLET PO QD FOR 3 DAYS THEN TAKE 1/2 TAB PO QD FOR 3 DAYS 20 tablet 0   No facility-administered medications prior to visit.    Allergies  Allergen Reactions   Ketorolac Tromethamine Hives   Morphine Other (See Comments)    "makes me the devil."   Latex Rash and Other (See Comments)    Latex catheter caused extreme irritation    Review of Systems  Constitutional:   Negative for fever and malaise/fatigue.  HENT:  Negative for congestion.   Eyes:  Positive for pain (right eye pain radiating from neck  pain). Negative for blurred vision.  Respiratory:  Negative for cough and shortness of breath.   Cardiovascular:  Negative for chest pain, palpitations and leg swelling.  Gastrointestinal:  Negative for vomiting.  Musculoskeletal:  Positive for neck pain. Negative for back pain.  Skin:  Negative for rash.  Neurological:  Negative for loss of consciousness and headaches.       Objective:    Physical Exam Vitals and nursing note reviewed.  Constitutional:      General: She is not in acute distress.    Appearance: Normal appearance. She is not ill-appearing.  HENT:     Head: Normocephalic and atraumatic.     Right Ear: External ear normal.     Left Ear: External ear normal.  Eyes:     Extraocular Movements: Extraocular movements intact.     Pupils: Pupils are equal, round, and reactive to light.  Cardiovascular:     Rate and Rhythm: Normal rate and regular rhythm.     Heart sounds: Normal heart sounds. No murmur heard.    No gallop.  Pulmonary:     Effort: Pulmonary effort is normal. No respiratory distress.     Breath sounds: Normal breath sounds. No wheezing or rales.  Skin:    General: Skin is warm and dry.  Neurological:     Mental Status: She is alert and oriented to person, place, and time.  Psychiatric:        Judgment: Judgment normal.     BP 132/80 (BP Location: Right Arm, Patient Position: Sitting, Cuff Size: Large)   Pulse 96   Temp 98.6 F (37 C) (Oral)   Resp 18   Ht 5\' 7"  (1.702 m)   Wt 295 lb 3.2 oz (133.9 kg)   SpO2 99%   BMI 46.23 kg/m  Wt Readings from Last 3 Encounters:  01/03/22 295 lb 3.2 oz (133.9 kg)  11/03/21 287 lb 9.6 oz (130.5 kg)  09/27/21 280 lb (127 kg)    Diabetic Foot Exam - Simple   No data filed    Lab Results  Component Value Date   WBC 3.9 (L) 09/27/2021   HGB 14.7 09/27/2021   HCT  41.9 09/27/2021   PLT 235.0 09/27/2021   GLUCOSE 93 09/27/2021   CHOL 164 09/27/2021   TRIG 52.0 09/27/2021   HDL 62.10 09/27/2021   LDLCALC 92 09/27/2021   ALT 17 09/27/2021   AST 15 09/27/2021   NA 136 09/27/2021   K 4.4 09/27/2021   CL 101 09/27/2021   CREATININE 0.59 09/27/2021   BUN 13 09/27/2021   CO2 28 09/27/2021   TSH 1.67 12/21/2020   HGBA1C 5.4 06/06/2013    Lab Results  Component Value Date   TSH 1.67 12/21/2020   Lab Results  Component Value Date   WBC 3.9 (L) 09/27/2021   HGB 14.7 09/27/2021   HCT 41.9 09/27/2021   MCV 85.1 09/27/2021   PLT 235.0 09/27/2021   Lab Results  Component Value Date   NA 136 09/27/2021   K 4.4 09/27/2021   CO2 28 09/27/2021   GLUCOSE 93 09/27/2021   BUN 13 09/27/2021   CREATININE 0.59 09/27/2021   BILITOT 1.0 09/27/2021   ALKPHOS 55 09/27/2021   AST 15 09/27/2021   ALT 17 09/27/2021   PROT 7.2 09/27/2021   ALBUMIN 4.4 09/27/2021   CALCIUM 9.7 09/27/2021   GFR 105.63 09/27/2021   Lab Results  Component Value Date   CHOL 164 09/27/2021   Lab Results  Component Value Date   HDL 62.10 09/27/2021   Lab Results  Component Value Date   LDLCALC 92 09/27/2021   Lab Results  Component Value Date   TRIG 52.0 09/27/2021   Lab Results  Component Value Date   CHOLHDL 3 09/27/2021   Lab Results  Component Value Date   HGBA1C 5.4 06/06/2013       Assessment & Plan:   Problem List Items Addressed This Visit       Unprioritized   Fibromyalgia   Relevant Medications   predniSONE (DELTASONE) 10 MG tablet   HYDROcodone-acetaminophen (NORCO) 10-325 MG tablet   Migraine without aura    ? Secondary to cervical radiculopathy pred taper , depo medrol 80  F/u ortho      Relevant Medications   HYDROcodone-acetaminophen (NORCO) 10-325 MG tablet   Other Visit Diagnoses     Neck pain    -  Primary   Relevant Medications   predniSONE (DELTASONE) 10 MG tablet   methylPREDNISolone acetate (DEPO-MEDROL) injection  80 mg (Completed)        Meds ordered this encounter  Medications   predniSONE (DELTASONE) 10 MG tablet    Sig: TAKE 3 TABLETS PO QD FOR 3 DAYS THEN TAKE 2 TABLETS PO QD FOR 3 DAYS THEN TAKE 1 TABLET PO QD FOR 3 DAYS THEN TAKE 1/2 TAB PO QD FOR 3 DAYS    Dispense:  20 tablet    Refill:  0   HYDROcodone-acetaminophen (NORCO) 10-325 MG tablet    Sig: Take 1 tablet by mouth every 6 (six) hours as needed.    Dispense:  30 tablet    Refill:  0   methylPREDNISolone acetate (DEPO-MEDROL) injection 80 mg    I, [name], DO, personally preformed the services described in this documentation.  All medical record entries made by the scribe were at my direction and in my presence.  I have reviewed the chart and discharge instructions (if applicable) and agree that the record reflects my personal performance and is accurate and complete. 01/03/2022   I,[name],acting as a Neurosurgeon for [employer], DO.,have documented all relevant documentation on the behalf of [name], DO,as directed by  [name], DO while in the presence of [name], DO.   [name], DO

## 2022-01-03 NOTE — Patient Instructions (Signed)
Cervical Radiculopathy  Cervical radiculopathy happens when a nerve in the neck (a cervical nerve) is pinched or bruised. This condition can happen because of an injury to the cervical spine (vertebrae) in the neck, or as part of the normal aging process. Pressure on the cervical nerves can cause pain or numbness that travels from the neck all the way down to the arm and fingers. This condition usually gets better with rest. Treatment may be needed if the condition does not improve. What are the causes? This condition may be caused by: A neck injury. A bulging (herniated) disk. Muscle spasms. Muscle tightness in the neck due to overuse. Arthritis. Breakdown or degeneration in the bones and joints of the spine (spondylosis) due to aging. Bone spurs that may develop near the cervical nerves. What are the signs or symptoms? Symptoms of this condition include: Pain. The pain may travel from the neck to the arm and hand. The pain can be severe or irritating. It may get worse when you move your neck. Numbness or tingling in your arm or hand. Weakness in the affected arm and hand, in severe cases. How is this diagnosed? This condition may be diagnosed based on your symptoms, your medical history, and a physical exam. You may also have tests, including: X-rays. CT scan. MRI. Electromyogram (EMG). Nerve conduction tests. How is this treated? In many cases, treatment is not needed for this condition. With rest, the condition usually gets better over time. If treatment is needed, options may include: Wearing a soft neck collar (cervical collar) for short periods of time. Doing physical therapy to strengthen your neck muscles. Taking medicines. These may include NSAIDs, such as ibuprofen, or oral corticosteroids. Having spinal injections, in severe cases. Having surgery. This may be needed if other treatments do not help. Different types of surgery may be done depending on the cause of this  condition. Follow these instructions at home: If you have a cervical collar: Wear it as told by your health care provider. Remove it only as told by your health care provider. Ask your health care provider if you can remove the cervical collar for cleaning and bathing. If you are allowed to remove the collar for cleaning or bathing: Follow instructions from your health care provider about how to remove the collar safely. Clean the collar by wiping it with mild soap and water and drying it completely. Take out any removable pads in the collar every 1-2 days, and wash them by hand with soap and water. Let them air-dry completely before you put them back in the collar. Check your skin under the collar for irritation or sores. If you see any, tell your health care provider. Managing pain     Take over-the-counter and prescription medicines only as told by your health care provider. If directed, put ice on the affected area. To do this: If you have a soft neck collar, remove it as told by your health care provider. Put ice in a plastic bag. Place a towel between your skin and the bag. Leave the ice on for 20 minutes, 2-3 times a day. Remove the ice if your skin turns bright red. This is very important. If you cannot feel pain, heat, or cold, you have a greater risk of damage to the area. If applying ice does not help, you can try using heat. Use the heat source that your health care provider recommends, such as a moist heat pack or a heating pad. Place a towel between   your skin and the heat source. Leave the heat on for 20-30 minutes. Remove the heat if your skin turns bright red. This is especially important if you are unable to feel pain, heat, or cold. You have a greater risk of getting burned. Try a gentle neck and shoulder massage to help relieve symptoms. Activity Rest as needed. Return to your normal activities as told by your health care provider. Ask your health care provider what  activities are safe for you. Do stretching and strengthening exercises as told by your health care provider or your physical therapist. You may have to avoid lifting. Ask your health care provider how much you can safely lift. General instructions Use a flat pillow when you sleep. Do not drive while wearing a cervical collar. If you do not have a cervical collar, ask your health care provider if it is safe to drive while your neck heals. Ask your health care provider if the medicine prescribed to you requires you to avoid driving or using machinery. Do not use any products that contain nicotine or tobacco. These products include cigarettes, chewing tobacco, and vaping devices, such as e-cigarettes. If you need help quitting, ask your health care provider. Keep all follow-up visits. This is important. Contact a health care provider if: Your condition does not improve with treatment. Get help right away if: Your pain gets much worse and is not controlled with medicines. You have weakness or numbness in your hand, arm, face, or leg. You have a high fever. You have a stiff, rigid neck. You lose control of your bowels or your bladder (have incontinence). You have trouble with walking, balance, or speaking. Summary Cervical radiculopathy happens when a nerve in the neck is pinched or bruised. A nerve can get pinched from a bulging disk, arthritis, muscle spasms, or an injury to the neck. Symptoms include pain, tingling, or numbness radiating from the neck to the arm or hand. Weakness can also occur in severe cases. Treatment may include rest, wearing a cervical collar, and physical therapy. Medicines may be prescribed to help with pain. In severe cases, injections or surgery may be needed. This information is not intended to replace advice given to you by your health care provider. Make sure you discuss any questions you have with your health care provider. Document Revised: 01/13/2021 Document  Reviewed: 01/13/2021 [employer] Patient Education  2023 [employer]  

## 2022-01-17 ENCOUNTER — Telehealth (INDEPENDENT_AMBULATORY_CARE_PROVIDER_SITE_OTHER): Payer: BC Managed Care – PPO | Admitting: Family

## 2022-01-17 DIAGNOSIS — J069 Acute upper respiratory infection, unspecified: Secondary | ICD-10-CM | POA: Diagnosis not present

## 2022-01-17 NOTE — Assessment & Plan Note (Signed)
New.  Symptoms most consistent with viral etiology. She tested negative for covid. Continue claritin D, nasonex. Recommended nasal saline rinse, tylenol prn discomfort, rest/hydration. She is advised to call if symptoms worsen or if symptoms are not improved in 1 week. Not written out of work for today and tomorrow.

## 2022-01-18 ENCOUNTER — Encounter: Payer: Self-pay | Admitting: Family Medicine

## 2022-01-18 ENCOUNTER — Encounter (HOSPITAL_COMMUNITY): Payer: Self-pay | Admitting: Psychiatry

## 2022-01-18 ENCOUNTER — Telehealth (INDEPENDENT_AMBULATORY_CARE_PROVIDER_SITE_OTHER): Payer: BC Managed Care – PPO | Admitting: Psychiatry

## 2022-01-18 DIAGNOSIS — F3342 Major depressive disorder, recurrent, in full remission: Secondary | ICD-10-CM

## 2022-01-18 DIAGNOSIS — F988 Other specified behavioral and emotional disorders with onset usually occurring in childhood and adolescence: Secondary | ICD-10-CM | POA: Diagnosis not present

## 2022-01-18 DIAGNOSIS — F411 Generalized anxiety disorder: Secondary | ICD-10-CM | POA: Diagnosis not present

## 2022-01-18 MED ORDER — VORTIOXETINE HBR 10 MG PO TABS: 10.0000 mg | ORAL_TABLET | Freq: Every day | ORAL | 0 refills | Status: DC

## 2022-01-18 NOTE — Progress Notes (Signed)
Psychiatric Initial Adult Assessment   Patient Identification: Jo Jackson MRN:  [id number] Date of Evaluation:  01/18/2022 Referral Source: primary care Chief Complaint:   Chief Complaint  Patient presents with   Depression   Establish Care   Visit Diagnosis:    ICD-10-CM   1. Major depressive disorder, recurrent episode, in full remission (HCC)  F33.42     2. GAD (generalized anxiety disorder)  F41.1     3. Attention deficit disorder, unspecified hyperactivity presence  F98.8     Virtual Visit via Video Note  I connected with Jo Jackson on 01/18/22 at 11:00 AM EDT by a video enabled telemedicine application and verified that I am speaking with the correct person using two identifiers.  Location: Patient: parked car  Provider: home office   I discussed the limitations of evaluation and management by telemedicine and the availability of in person appointments. The patient expressed understanding and agreed to proceed.     I discussed the assessment and treatment plan with the patient. The patient was provided an opportunity to ask questions and all were answered. The patient agreed with the plan and demonstrated an understanding of the instructions.   The patient was advised to call back or seek an in-person evaluation if the symptoms worsen or if the condition fails to improve as anticipated.  I provided 45 - 50 minutes of non-face-to-face time during this encounter including chart review, documentation.     History of Present Illness: Patient is a 51 years old currently single Caucasian female currently works part-time at [employer] she is widowed and has 2 kids young daughters age 71 or 33.  Referred by primary care physician to establish care for psych medications including depression and assessment  Patient has been patient of Jo Jackson and was getting her medications till he retired in 2021 after that she started following with [facility].  Patient has been off medication and wanted to change provider has been diagnosed with depression, anxiety  Patient gives a long history of episodes of depression including difficult childhood growing with mom and has been put in the hospital 1 time for behavioral condition or depression since her mom was into drugs and she got her own way to admit her  In general she has been on different medical including Cymbalta Wellbutrin, Abilify long list of nearly 10-12 medications Says only medication that helps is Trintellix but at a dose of 30 mg at the primary care physician cannot prescribe so she has received a psychiatrist. Trintellix helps her depression and she wants to go back she is also on Adderall or has been on Adderall up to the dose of 30 mg 2 times a day question about the dose and she states that it helps helps her attention but I did caution about mood symptoms and agitation that it can cause including worsening of anxiety symptoms  She describes her depression to be a withdrawn decreased energy disturbed sleep out of focus distraction and feeling of despair and hopelessness at times but not having suicidal thoughts no psychotic symptoms no manic symptoms currently past she does endorse worries excessive worries at times with worries related with her future and taking care of her kids finances losing her husband and dad with Hodgkin's lymphoma She also has back condition currently on pain management clinic with a contract she understands not to take benzodiazepine for anxiety.  States she takes medication for back condition and fibromyalgia  Aggravating factors; husband died  of Hodgkin's lymphoma in 2022 dad died of pulm skin for lymphoma in the past.  COVID in her skin there from August because of that for her husband, difficult childhood  Modifying factors kids, she takes them to practice for same in taekwondo  Duration since young age  Drug use denies    Past Psychiatric  History: depressoin,   Previous Psychotropic Medications: Yes   Substance Abuse History in the last 12 months:  No.  Consequences of Substance Abuse: NA  Past Medical History:  Past Medical History:  Diagnosis Date   Abnormal Pap smear    Repeats WNL   ADD (attention deficit disorder)    Anxiety    Arthritis    Oseteoarthritis   Depression    GERD (gastroesophageal reflux disease)    Headache(784.0)    Migraines   Junctional escape rhythm    Neuromuscular disorder (HCC)    Fibromyalgia   Pregnancy induced hypertension    Supraventricular tachycardia (HCC)     Past Surgical History:  Procedure Laterality Date   ABLATION OF DYSRHYTHMIC FOCUS     APPENDECTOMY  1986   CARPAL TUNNEL RELEASE  2000   CERVICAL FUSION  08-24-99,04-26-00,05-09-01   Knee sugery Left    16 yrs. old   SHOULDER SURGERY  left-6-04, right 09-12-04   tongue polyp  1984    Family Psychiatric History: Mom: drug use  Family History:  Family History  Problem Relation Age of Onset   Migraines Mother    COPD Mother    Heart disease Mother        chf   Depression Mother    Hodgkin's lymphoma Father    ADD / ADHD Brother    Cancer Paternal Aunt        breast   Diabetes Other    Thyroid disease Other    Hypertension Other    Depression Other    Bipolar disorder Other     Social History:   Social History   Socioeconomic History   Marital status: Widowed    Spouse name: Not on file   Number of children: 2   Years of education: Not on file   Highest education level: Not on file  Occupational History    Employer: [employer]    Comment: P/T   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    Employer: [employer]  Tobacco Use   Smoking status: Former    Types: Cigarettes    Quit date: 10/23/2007    Years since quitting: 14.2   Smokeless tobacco: Never  Vaping Use   Vaping Use: Never used  Substance and Sexual Activity   Alcohol use: Yes    Alcohol/week: 0.0 standard drinks of alcohol    Comment: Occ    Drug use: No   Sexual activity: Yes  Other Topics Concern   Not on file  Social History Narrative   G1p1      Exercise- no      Right handed   Social Determinants of Health   Financial Resource Strain: Not on file  Food Insecurity: Not on file  Transportation Needs: Not on file  Physical Activity: Not on file  Stress: Not on file  Social Connections: Not on file    Additional Social History: grew up with mom, difficult childhood as dad died when she was young, difficult time with mom in drugs and she got her admitted one time for depression.  Allergies:   Allergies  Allergen Reactions   Ketorolac Tromethamine Hives  Morphine Other (See Comments)    "makes me the devil."   Latex Rash and Other (See Comments)    Latex catheter caused extreme irritation    Metabolic Disorder Labs: Lab Results  Component Value Date   HGBA1C 5.4 06/06/2013   MPG 108 06/06/2013   No results found for: "PROLACTIN" Lab Results  Component Value Date   CHOL 164 09/27/2021   TRIG 52.0 09/27/2021   HDL 62.10 09/27/2021   CHOLHDL 3 09/27/2021   VLDL 10.4 09/27/2021   LDLCALC 92 09/27/2021   LDLCALC 80 12/21/2020   Lab Results  Component Value Date   TSH 1.67 12/21/2020    Therapeutic Level Labs: No results found for: "LITHIUM" No results found for: "CBMZ" No results found for: "VALPROATE"  Current Medications: Current Outpatient Medications  Medication Sig Dispense Refill   vortioxetine HBr (TRINTELLIX) 10 MG TABS tablet Take 1 tablet (10 mg total) by mouth daily. Start with 10mg  a day for 5 days and then 2 a day. 60 tablet 0   albuterol (PROVENTIL) (2.5 MG/3ML) 0.083% nebulizer solution Take 3 mLs (2.5 mg total) by nebulization every 6 (six) hours as needed for wheezing or shortness of breath. 75 mL 12   albuterol (VENTOLIN HFA) 108 (90 Base) MCG/ACT inhaler TAKE 2 PUFFS BY MOUTH EVERY 6 HOURS AS NEEDED FOR WHEEZE OR SHORTNESS OF BREATH 18 g 5   amphetamine-dextroamphetamine  (ADDERALL) 30 MG tablet Take 1 tablet by mouth 2 (two) times daily. 180 tablet 0   Erenumab-aooe (AIMOVIG) 140 MG/ML SOAJ Inject 140 mg into the skin every 28 (twenty-eight) days. 1 mL 1   fluticasone-salmeterol (ADVAIR DISKUS) 250-50 MCG/ACT AEPB Inhale 1 puff into the lungs in the morning and at bedtime. 180 each 1   HYDROcodone-acetaminophen (NORCO) 10-325 MG tablet Take 1 tablet by mouth every 6 (six) hours as needed. 30 tablet 0   INCASSIA 0.35 MG tablet TAKE 1 TABLET BY MOUTH EVERY DAY 84 tablet 1   meclizine (ANTIVERT) 25 MG tablet TAKE 1 TABLET BY MOUTH 3 TIMES DAILY AS NEEDED FOR DIZZINESS. 30 tablet 0   mometasone (NASONEX) 50 MCG/ACT nasal spray PLACE 2 SPRAYS INTO THE NOSE DAILY. 51 each 4   ondansetron (ZOFRAN-ODT) 8 MG disintegrating tablet DISSOLVE 1 TABLET ON TONGUE EVERY 8 HOURS AS NEEDED FOR NAUSEA AND VOMITING 20 tablet 1   predniSONE (DELTASONE) 10 MG tablet TAKE 3 TABLETS PO QD FOR 3 DAYS THEN TAKE 2 TABLETS PO QD FOR 3 DAYS THEN TAKE 1 TABLET PO QD FOR 3 DAYS THEN TAKE 1/2 TAB PO QD FOR 3 DAYS 20 tablet 0   SUMAtriptan (IMITREX) 100 MG tablet TAKE 1 TABLET (100 MG TOTAL) BY MOUTH EVERY 2 (TWO) HOURS AS NEEDED FOR MIGRAINE. MAY REPEAT IN 2 HOURS IF HEADACHE PERSISTS OR RECURS. 10 tablet 5   SUMAtriptan (IMITREX) 50 MG tablet Take 1 tablet (50 mg total) by mouth every 2 (two) hours as needed for migraine. May repeat in 2 hours if headache persists or recurs. 10 tablet 0   tiZANidine (ZANAFLEX) 4 MG tablet Take 1 tablet (4 mg total) by mouth every 6 (six) hours as needed for muscle spasms. 90 tablet 1   traMADol (ULTRAM) 50 MG tablet Take 1 tablet (50 mg total) by mouth every 8 (eight) hours as needed. 30 tablet 0   TRINTELLIX 10 MG TABS tablet Take 30 mg by mouth daily at 2 PM.     Ubrogepant (UBRELVY) 50 MG TABS 1 po x1, may repeat  after 2 hours 30 tablet 2   No current facility-administered medications for this visit.     Psychiatric Specialty Exam: Review of Systems   Cardiovascular:  Negative for chest pain.  Neurological:  Negative for tremors.  Psychiatric/Behavioral:  Positive for agitation and dysphoric mood. Negative for self-injury.     There were no vitals taken for this visit.There is no height or weight on file to calculate BMI.  General Appearance: Casual  Eye Contact:  Fair  Speech:  Normal Rate  Volume:  Increased  Mood:  Dysphoric  Affect:  Congruent  Thought Process:  Goal Directed  Orientation:  Full (Time, Place, and Person)  Thought Content:  Rumination  Suicidal Thoughts:  No  Homicidal Thoughts:  No  Memory:  Immediate;   Fair  Judgement:  Fair  Insight:  Fair  Psychomotor Activity:  Normal  Concentration:  Concentration: Fair  Recall:  [employer] of Knowledge:Fair  Language: Good  Akathisia:  No  Handed:    AIMS (if indicated):  not done  Assets:  Desire for Improvement  ADL's:  Intact  Cognition: WNL  Sleep:   variable   Screenings: PHQ2-9    Flowsheet Row Video Visit from [facility]18[facility]2022 [facility] [place] Office Visit from 12/21/2020 in [facility] Office Visit from 08/16/2018 in [facility] Office Visit from 05/28/2017 in [facility] Office Visit from 02/12/2017 in [facility]  PHQ-2 Total Score 2 0 5 0 0  PHQ-9 Total Score 10 -- 19 -- --      Flowsheet Row Video Visit from 01/18/2022 in [facility] [place]  C-SSRS RISK CATEGORY No Risk       Assessment and Plan: as follows  Major depressive disorder recurrent moderate to severe; restart Trintellix 10 mg increase to 20 mg she does have tablets left over and next within 7 to 14 days we can increase it to 30 mg she will call us when she is ready for refill  Generalized anxiety disorder discussed Trintellix effects on anxiety as well  and consider therapy if needed she keeps her self engaged with activities including for the kids and her part-time work  ADHD; initially prescribed Adderall by primary care physician later on taken over by JoP PTSD retired.  Currently she has got this prescription from Jo Jackson at [facility].  I discussed in detail about the effects of Adderall on anxiety and agitation.  She agrees to cut down the dose if need to and she agrees to get it from her primary care physician was the initial platform.  She was started on ADHD medication.  We discussed I can take over the Trintellix and she will talk with the primary care physician or her past provider for continuing Adderall and discussed to cut down the dose and its effect as well  Medications and side effects reviewed Follow-up in 4 to 6 weeks or earlier if needed   Collaboration of Care: Medication Management AEB primary care and past provider notes and meds reviewed  Patient/Guardian was advised Release of Information must be obtained prior to any record release in order to collaborate their care with an outside provider. Patient/Guardian was advised if they have not already done so to contact the registration department to sign all necessary forms in order for [place] to release information regarding  their care.   Consent: Patient/Guardian gives verbal consent for treatment and assignment of benefits for services provided during this visit. Patient/Guardian expressed understanding and agreed to proceed.   Thresa Ross, MD 6/28/202311:39 AM

## 2022-01-19 ENCOUNTER — Ambulatory Visit: Payer: BC Managed Care – PPO | Admitting: Family Medicine

## 2022-01-19 ENCOUNTER — Encounter: Payer: Self-pay | Admitting: Family Medicine

## 2022-01-19 VITALS — BP 118/74 | HR 81 | Temp 98.6°F | Resp 18 | Ht 67.0 in | Wt 295.8 lb

## 2022-01-19 DIAGNOSIS — J0140 Acute pansinusitis, unspecified: Secondary | ICD-10-CM | POA: Diagnosis not present

## 2022-01-19 DIAGNOSIS — Z1211 Encounter for screening for malignant neoplasm of colon: Secondary | ICD-10-CM

## 2022-01-19 DIAGNOSIS — R051 Acute cough: Secondary | ICD-10-CM | POA: Diagnosis not present

## 2022-01-19 DIAGNOSIS — J324 Chronic pansinusitis: Secondary | ICD-10-CM

## 2022-01-19 MED ORDER — PROMETHAZINE-DM 6.25-15 MG/5ML PO SYRP: 5.0000 mL | ORAL_SOLUTION | Freq: Four times a day (QID) | ORAL | 0 refills | Status: DC | PRN

## 2022-01-19 MED ORDER — AMOXICILLIN-POT CLAVULANATE 875-125 MG PO TABS: 1.0000 | ORAL_TABLET | Freq: Two times a day (BID) | ORAL | 0 refills | Status: DC

## 2022-01-19 NOTE — Assessment & Plan Note (Signed)
abx per orders con't nasonex

## 2022-01-19 NOTE — Progress Notes (Signed)
Established Patient Office Visit  Subjective   Patient ID: Jo Jackson, female    DOB: 1971/04/02  Age: 51 y.o. MRN: [id number]  Chief Complaint  Patient presents with   Sinus Problem    Pt seen virtually by Melissa on 01/17/2022. Sxs have not imrpoved. Pt was not rx any medication during virtual. Pt states having a constant headache   Follow-up    HPI Pt here c/o sinus congestion and cough , productive she has been taking zyrtec D, nasonex and mucinex with no relief.  Symptoms started last weekend.  No fever.  Neg covid test  Patient Active Problem List   Diagnosis Date Noted   Skin tag 11/03/2021   Inflamed skin tag 11/03/2021   Morbid obesity (HCC) 09/27/2021   Migraine 12/21/2020   Muscle spasm 12/21/2020   Nausea 12/21/2020   Primary hypertension 12/03/2020   Dizzy 08/18/2020   Adult ADHD 06/04/2019   Social anxiety disorder 06/04/2019   Influenza 04/15/2018   Recurrent acute serous otitis media of right ear 03/22/2018   Vaginal irritation 03/22/2018   Pansinusitis 10/12/2017   Viral URI 05/28/2017   Skin tag of perianal region 11/20/2016   Rectal bleeding 08/20/2015   Bronchitis 06/04/2015   Sensation of fullness in right ear 04/07/2015   Cervicogenic headache 11/06/2014   Obesity (BMI 30-39.9) 06/24/2013   Morbid obesity with BMI of 40.0-44.9, adult (HCC) 06/07/2013   SVD (spontaneous vaginal delivery) 12/15/2012    Class: Status post   HAIR LOSS 08/11/2010   FATIGUE 08/11/2010   SINUSITIS - ACUTE-NOS 06/29/2010   GERD 06/29/2010   PAIN IN THORACIC SPINE 05/12/2010   Migraine without aura 02/10/2010   ELEVATED BLOOD PRESSURE WITHOUT DIAGNOSIS OF HYPERTENSION 02/10/2010   AMENORRHEA 04/26/2009   GAD (generalized anxiety disorder) 03/26/2009   Major depressive disorder, recurrent episode, in full remission (HCC) 03/26/2009   ALLERGIC REACTION 08/26/2008   Fibromyalgia 03/05/2007   ARTHROSCOPY, LEFT KNEE, HX OF 03/05/2007   Headache 01/31/2007   HX,  PERSONAL, TOBACCO USE 01/31/2007   Past Medical History:  Diagnosis Date   Abnormal Pap smear    Repeats WNL   ADD (attention deficit disorder)    Anxiety    Arthritis    Oseteoarthritis   Depression    GERD (gastroesophageal reflux disease)    Headache(784.0)    Migraines   Junctional escape rhythm    Neuromuscular disorder (HCC)    Fibromyalgia   Pregnancy induced hypertension    Supraventricular tachycardia (HCC)    Past Surgical History:  Procedure Laterality Date   ABLATION OF DYSRHYTHMIC FOCUS     APPENDECTOMY  1986   CARPAL TUNNEL RELEASE  2000   CERVICAL FUSION  08-24-99,04-26-00,05-09-01   Knee sugery Left    16 yrs. old   SHOULDER SURGERY  left-6-04, right 09-12-04   tongue polyp  1984   Social History   Tobacco Use   Smoking status: Former    Types: Cigarettes    Quit date: 10/23/2007    Years since quitting: 14.2   Smokeless tobacco: Never  Vaping Use   Vaping Use: Never used  Substance Use Topics   Alcohol use: Yes    Alcohol/week: 0.0 standard drinks of alcohol    Comment: Occ   Drug use: No   Social History   Socioeconomic History   Marital status: Widowed    Spouse name: Not on file   Number of children: 2   Years of education: Not on file   Highest  education level: Not on file  Occupational History    Employer: [employer]    Comment: P/T   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    Employer: [employer]  Tobacco Use   Smoking status: Former    Types: Cigarettes    Quit date: 10/23/2007    Years since quitting: 14.2   Smokeless tobacco: Never  Vaping Use   Vaping Use: Never used  Substance and Sexual Activity   Alcohol use: Yes    Alcohol/week: 0.0 standard drinks of alcohol    Comment: Occ   Drug use: No   Sexual activity: Yes  Other Topics Concern   Not on file  Social History Narrative   G1p1      Exercise- no      Right handed   Social Determinants of Health   Financial Resource Strain: Not on file  Food Insecurity: Not on file   Transportation Needs: Not on file  Physical Activity: Not on file  Stress: Not on file  Social Connections: Not on file  Intimate Partner Violence: Not on file   Family Status  Relation Name Status   Mother  Deceased at age 39   Father  Deceased at age 58   Brother  (Not Specified)   Oceanographer  Alive   Other  (Not Specified)   Other  (Not Specified)   Other  (Not Specified)   Other  (Not Specified)   Other  (Not Specified)   Family History  Problem Relation Age of Onset   Migraines Mother    COPD Mother    Heart disease Mother        chf   Depression Mother    Hodgkin's lymphoma Father    ADD / ADHD Brother    Cancer Paternal Aunt        breast   Diabetes Other    Thyroid disease Other    Hypertension Other    Depression Other    Bipolar disorder Other    Allergies  Allergen Reactions   Ketorolac Tromethamine Hives   Morphine Other (See Comments)    "makes me the devil."   Latex Rash and Other (See Comments)    Latex catheter caused extreme irritation      Review of Systems  Constitutional:  Positive for chills and malaise/fatigue. Negative for fever.  HENT:  Positive for congestion and sinus pain. Negative for ear discharge, ear pain and sore throat.   Respiratory:  Positive for cough and sputum production. Negative for shortness of breath.   Neurological:  Positive for headaches.      Objective:     BP 118/74 (BP Location: Right Arm, Patient Position: Sitting, Cuff Size: Large)   Pulse 81   Temp 98.6 F (37 C) (Oral)   Resp 18   Ht 5\' 7"  (1.702 m)   Wt 295 lb 12.8 oz (134.2 kg)   SpO2 99%   BMI 46.33 kg/m  BP Readings from Last 3 Encounters:  01/19/22 118/74  01/03/22 132/80  11/03/21 126/84   Wt Readings from Last 3 Encounters:  01/19/22 295 lb 12.8 oz (134.2 kg)  01/03/22 295 lb 3.2 oz (133.9 kg)  11/03/21 287 lb 9.6 oz (130.5 kg)   SpO2 Readings from Last 3 Encounters:  01/19/22 99%  01/03/22 99%  11/03/21 98%      Physical  Exam Vitals and nursing note reviewed.  Constitutional:      Appearance: She is diaphoretic.  HENT:     Nose:  Mucosal edema and rhinorrhea present. No nasal deformity.     Right Sinus: Maxillary sinus tenderness and frontal sinus tenderness present.     Left Sinus: Maxillary sinus tenderness and frontal sinus tenderness present.     Mouth/Throat:     Pharynx: No oropharyngeal exudate.  Cardiovascular:     Rate and Rhythm: Normal rate and regular rhythm.     Heart sounds: Normal heart sounds. No murmur heard. Pulmonary:     Effort: Pulmonary effort is normal. No respiratory distress.     Breath sounds: Normal breath sounds. No rales.  Musculoskeletal:     Cervical back: Normal range of motion and neck supple.  Lymphadenopathy:     Cervical: No cervical adenopathy.  Skin:    General: Skin is warm.  Neurological:     Mental Status: She is alert and oriented to person, place, and time.     No results found for any visits on 01/19/22.  Last CBC Lab Results  Component Value Date   WBC 3.9 (L) 09/27/2021   HGB 14.7 09/27/2021   HCT 41.9 09/27/2021   MCV 85.1 09/27/2021   MCH 28.5 08/20/2015   RDW 13.1 09/27/2021   PLT 235.0 09/27/2021   Last metabolic panel Lab Results  Component Value Date   GLUCOSE 93 09/27/2021   NA 136 09/27/2021   K 4.4 09/27/2021   CL 101 09/27/2021   CO2 28 09/27/2021   BUN 13 09/27/2021   CREATININE 0.59 09/27/2021   GFRNONAA >90 12/13/2012   CALCIUM 9.7 09/27/2021   PROT 7.2 09/27/2021   ALBUMIN 4.4 09/27/2021   BILITOT 1.0 09/27/2021   ALKPHOS 55 09/27/2021   AST 15 09/27/2021   ALT 17 09/27/2021   Last lipids Lab Results  Component Value Date   CHOL 164 09/27/2021   HDL 62.10 09/27/2021   LDLCALC 92 09/27/2021   TRIG 52.0 09/27/2021   CHOLHDL 3 09/27/2021   Last hemoglobin A1c Lab Results  Component Value Date   HGBA1C 5.4 06/06/2013   Last thyroid functions Lab Results  Component Value Date   TSH 1.67 12/21/2020    Last vitamin D No results found for: "25OHVITD2", "25OHVITD3", "VD25OH" Last vitamin B12 and Folate Lab Results  Component Value Date   VITAMINB12 766 08/11/2010   FOLATE 18.0 08/11/2010      The 10-year ASCVD risk score (Arnett DK, et al., 2019) is: 0.7%    Assessment & Plan:   Problem List Items Addressed This Visit       Unprioritized   SINUSITIS - ACUTE-NOS - Primary   Relevant Medications   amoxicillin-clavulanate (AUGMENTIN) 875-125 MG tablet   promethazine-dextromethorphan (PROMETHAZINE-DM) 6.25-15 MG/5ML syrup   Pansinusitis    abx per orders con't nasonex       Relevant Medications   amoxicillin-clavulanate (AUGMENTIN) 875-125 MG tablet   promethazine-dextromethorphan (PROMETHAZINE-DM) 6.25-15 MG/5ML syrup   Other Visit Diagnoses     Acute cough       Relevant Medications   promethazine-dextromethorphan (PROMETHAZINE-DM) 6.25-15 MG/5ML syrup   Colon cancer screening       Relevant Orders   Ambulatory referral to Gastroenterology       Return if symptoms worsen or fail to improve.    [name], DO

## 2022-01-19 NOTE — Patient Instructions (Signed)

## 2022-02-07 ENCOUNTER — Ambulatory Visit (HOSPITAL_BASED_OUTPATIENT_CLINIC_OR_DEPARTMENT_OTHER)
Admission: RE | Admit: 2022-02-07 | Discharge: 2022-02-07 | Disposition: A | Discharge status: Home / Self Care | Payer: BC Managed Care – PPO | Source: Ambulatory Visit | Attending: Medical | Admitting: Medical

## 2022-02-07 ENCOUNTER — Ambulatory Visit: Payer: BC Managed Care – PPO | Admitting: Medical

## 2022-02-07 VITALS — BP 150/70 | HR 76 | Temp 98.2°F | Resp 18 | Ht 67.0 in | Wt 300.0 lb

## 2022-02-07 DIAGNOSIS — J4540 Moderate persistent asthma, uncomplicated: Secondary | ICD-10-CM

## 2022-02-07 DIAGNOSIS — R0600 Dyspnea, unspecified: Secondary | ICD-10-CM | POA: Diagnosis not present

## 2022-02-07 DIAGNOSIS — M79609 Pain in unspecified limb: Secondary | ICD-10-CM | POA: Insufficient documentation

## 2022-02-07 MED ORDER — PREDNISONE 10 MG (21) PO TBPK: ORAL_TABLET | ORAL | 0 refills | Status: DC

## 2022-02-07 NOTE — Addendum Note (Signed)
Addended by: Gwenevere Abbot on: 02/07/2022 07:16 PM   Modules accepted: Orders

## 2022-02-07 NOTE — Progress Notes (Signed)
Subjective:    Patient ID: Jo Jackson, female    DOB: 04-Nov-1970, 50 y.o.   MRN: [id number]  HPI  Pt in reporting this morning she woke up and she feel shortness of breath.   Pt took albuterol this morning thru nebulizer and pocket inhaler. It did help. Pt states still feels some tightness. Pt states in past has to use inhaler couple of times a week. In past heat and humidity would often cause flares. She has adviar available but did not take/use.     Pt does have allergies and recently saw UC that gave nasal spray astelin and prednisone. Given 20 mg 1 tab twice a day. She did not taken prednisone today.   Pt notes also no AC at her work. Also exposed to dust at work.    Review of Systems  Constitutional:  Negative for chills and fatigue.  HENT:  Negative for congestion and ear pain.   Respiratory:  Positive for shortness of breath. Negative for choking and wheezing.        Feel like can't take full deep breath and when dose will cough.  Cardiovascular:  Negative for chest pain and palpitations.  Genitourinary:  Negative for dysuria.  Musculoskeletal:  Negative for back pain.  Skin:  Negative for rash.  Neurological:  Negative for dizziness, syncope and headaches.  Hematological:  Negative for adenopathy. Does not bruise/bleed easily.  Psychiatric/Behavioral:  Negative for behavioral problems and dysphoric mood.     Past Medical History:  Diagnosis Date   Abnormal Pap smear    Repeats WNL   ADD (attention deficit disorder)    Anxiety    Arthritis    Oseteoarthritis   Depression    GERD (gastroesophageal reflux disease)    Headache(784.0)    Migraines   Junctional escape rhythm    Neuromuscular disorder (HCC)    Fibromyalgia   Pregnancy induced hypertension    Supraventricular tachycardia (HCC)      Social History   Socioeconomic History   Marital status: Widowed    Spouse name: Not on file   Number of children: 2   Years of education: Not on file    Highest education level: Not on file  Occupational History    Employer: [employer]    Comment: P/T   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    Employer: [employer]  Tobacco Use   Smoking status: Former    Types: Cigarettes    Quit date: 10/23/2007    Years since quitting: 14.3   Smokeless tobacco: Never  Vaping Use   Vaping Use: Never used  Substance and Sexual Activity   Alcohol use: Yes    Alcohol/week: 0.0 standard drinks of alcohol    Comment: Occ   Drug use: No   Sexual activity: Yes  Other Topics Concern   Not on file  Social History Narrative   G1p1      Exercise- no      Right handed   Social Determinants of Health   Financial Resource Strain: Not on file  Food Insecurity: Not on file  Transportation Needs: Not on file  Physical Activity: Not on file  Stress: Not on file  Social Connections: Not on file  Intimate Partner Violence: Not on file    Past Surgical History:  Procedure Laterality Date   ABLATION OF DYSRHYTHMIC FOCUS     APPENDECTOMY  1986   CARPAL TUNNEL RELEASE  2000   CERVICAL FUSION  08-24-99,04-26-00,05-09-01   Knee sugery Left  16 yrs. old   SHOULDER SURGERY  left-6-04, right 09-12-04   tongue polyp  1984    Family History  Problem Relation Age of Onset   Migraines Mother    COPD Mother    Heart disease Mother        chf   Depression Mother    Hodgkin's lymphoma Father    ADD / ADHD Brother    Cancer Paternal Aunt        breast   Diabetes Other    Thyroid disease Other    Hypertension Other    Depression Other    Bipolar disorder Other     Allergies  Allergen Reactions   Ketorolac Tromethamine Hives   Morphine Other (See Comments)    "makes me the devil."   Latex Rash and Other (See Comments)    Latex catheter caused extreme irritation    Current Outpatient Medications on File Prior to Visit  Medication Sig Dispense Refill   albuterol (PROVENTIL) (2.5 MG/3ML) 0.083% nebulizer solution Take 3 mLs (2.5 mg total) by nebulization  every 6 (six) hours as needed for wheezing or shortness of breath. 75 mL 12   albuterol (VENTOLIN HFA) 108 (90 Base) MCG/ACT inhaler TAKE 2 PUFFS BY MOUTH EVERY 6 HOURS AS NEEDED FOR WHEEZE OR SHORTNESS OF BREATH 18 g 5   amoxicillin-clavulanate (AUGMENTIN) 875-125 MG tablet Take 1 tablet by mouth 2 (two) times daily. 20 tablet 0   amphetamine-dextroamphetamine (ADDERALL) 30 MG tablet Take 1 tablet by mouth 2 (two) times daily. 180 tablet 0   Erenumab-aooe (AIMOVIG) 140 MG/ML SOAJ Inject 140 mg into the skin every 28 (twenty-eight) days. 1 mL 1   fluticasone-salmeterol (ADVAIR DISKUS) 250-50 MCG/ACT AEPB Inhale 1 puff into the lungs in the morning and at bedtime. 180 each 1   HYDROcodone-acetaminophen (NORCO) 10-325 MG tablet Take 1 tablet by mouth every 6 (six) hours as needed. 30 tablet 0   INCASSIA 0.35 MG tablet TAKE 1 TABLET BY MOUTH EVERY DAY 84 tablet 1   meclizine (ANTIVERT) 25 MG tablet TAKE 1 TABLET BY MOUTH 3 TIMES DAILY AS NEEDED FOR DIZZINESS. 30 tablet 0   mometasone (NASONEX) 50 MCG/ACT nasal spray PLACE 2 SPRAYS INTO THE NOSE DAILY. 51 each 4   ondansetron (ZOFRAN-ODT) 8 MG disintegrating tablet DISSOLVE 1 TABLET ON TONGUE EVERY 8 HOURS AS NEEDED FOR NAUSEA AND VOMITING 20 tablet 1   promethazine-dextromethorphan (PROMETHAZINE-DM) 6.25-15 MG/5ML syrup Take 5 mLs by mouth 4 (four) times daily as needed. 118 mL 0   SUMAtriptan (IMITREX) 100 MG tablet TAKE 1 TABLET (100 MG TOTAL) BY MOUTH EVERY 2 (TWO) HOURS AS NEEDED FOR MIGRAINE. MAY REPEAT IN 2 HOURS IF HEADACHE PERSISTS OR RECURS. 10 tablet 5   SUMAtriptan (IMITREX) 50 MG tablet Take 1 tablet (50 mg total) by mouth every 2 (two) hours as needed for migraine. May repeat in 2 hours if headache persists or recurs. 10 tablet 0   tiZANidine (ZANAFLEX) 4 MG tablet Take 1 tablet (4 mg total) by mouth every 6 (six) hours as needed for muscle spasms. 90 tablet 1   traMADol (ULTRAM) 50 MG tablet Take 1 tablet (50 mg total) by mouth every 8  (eight) hours as needed. 30 tablet 0   TRINTELLIX 10 MG TABS tablet Take 30 mg by mouth daily at 2 PM.     Ubrogepant (UBRELVY) 50 MG TABS 1 po x1, may repeat after 2 hours 30 tablet 2   vortioxetine HBr (TRINTELLIX) 10 MG TABS  tablet Take 1 tablet (10 mg total) by mouth daily. Start with 10mg  a day for 5 days and then 2 a day. 60 tablet 0   No current facility-administered medications on file prior to visit.    BP (!) 150/70   Pulse 76   Temp 98.2 F (36.8 C)   Resp 18   Ht 5\' 7"  (1.702 m)   Wt 300 lb (136.1 kg)   SpO2 99%   BMI 46.99 kg/m    96-97% 02 sat on walking down the hall.     Objective:   Physical Exam  General Mental Status- Alert. General Appearance- Not in acute distress.   Skin General: Color- Normal Color. Moisture- Normal Moisture.  Neck Carotid Arteries- Normal color. Moisture- Normal Moisture. No carotid bruits. No JVD.  Chest and Lung Exam Auscultation: Breath Sounds:-Normal.  Cardiovascular Auscultation:Rythm- Regular. Murmurs & Other Heart Sounds:Auscultation of the heart reveals- No Murmurs.  Abdomen Inspection:-Inspeection Normal. Palpation/Percussion:Note:No mass. Palpation and Percussion of the abdomen reveal- Non Tender, Non Distended + BS, no rebound or guarding.  Neurologic Cranial Nerve exam:- CN III-XII intact(No nystagmus), symmetric smile. Strength:- 5/5 equal and symmetric strength both upper and lower extremities.   Lower ext- calfs symmetric. No edema. On exam mild positive homans signs.    Assessment & Plan:   Patient Instructions  Asthma history with acute onset of shortness of breath since this morning. Good 02 sat % levels. But not responding well to albuterol inhaler and prednisone moderate dose.  Presently add on adviar inhaler take 1 inhalation twice daily.  Continue albuterol every 4-6 hours as needed.  Get cxr today and bilateral lower ext . Please explain to staff/scheduling need study done today earliest  as possible.  If above negative and symptoms persist may need to increase dosage of taper prednisone and consider steroid injection.  Any severe shortness of breath or rapidly dropping 02 sat then be seen in ED.  Follow up in 7 days or sooner if needed.    [place], PA-C

## 2022-02-07 NOTE — Patient Instructions (Addendum)
Asthma history with acute onset of shortness of breath since this morning. Good 02 sat % levels. But not responding well to albuterol inhaler and prednisone moderate dose.  Presently add on advair inhaler take 1 inhalation twice daily.  Continue albuterol every 4-6 hours as needed.  Get cxr today and bilateral lower ext [place]. Please explain to [place] staff/scheduling need study done today earliest as possible.  If above negative and symptoms persist may need to increase dosage of taper prednisone and consider steroid injection.  Any severe shortness of breath or rapidly dropping 02 sat then be seen in ED.  Follow up in 7 days or sooner if needed.

## 2022-02-08 ENCOUNTER — Other Ambulatory Visit: Payer: Self-pay | Admitting: Family Medicine

## 2022-02-08 DIAGNOSIS — J069 Acute upper respiratory infection, unspecified: Secondary | ICD-10-CM

## 2022-02-14 ENCOUNTER — Encounter: Payer: Self-pay | Admitting: Family Medicine

## 2022-02-14 DIAGNOSIS — J4540 Moderate persistent asthma, uncomplicated: Secondary | ICD-10-CM

## 2022-02-14 DIAGNOSIS — Z77120 Contact with and (suspected) exposure to mold (toxic): Secondary | ICD-10-CM

## 2022-02-26 ENCOUNTER — Other Ambulatory Visit: Payer: Self-pay | Admitting: Family Medicine

## 2022-03-01 ENCOUNTER — Telehealth (HOSPITAL_COMMUNITY): Payer: BC Managed Care – PPO | Admitting: Psychiatry

## 2022-03-03 ENCOUNTER — Ambulatory Visit (INDEPENDENT_AMBULATORY_CARE_PROVIDER_SITE_OTHER): Payer: BC Managed Care – PPO | Admitting: Pulmonary Disease

## 2022-03-03 ENCOUNTER — Other Ambulatory Visit: Payer: Self-pay | Admitting: *Deleted

## 2022-03-03 ENCOUNTER — Encounter: Payer: Self-pay | Admitting: Family Medicine

## 2022-03-03 ENCOUNTER — Encounter: Payer: Self-pay | Admitting: Pulmonary Disease

## 2022-03-03 VITALS — BP 134/72 | HR 95 | Temp 98.4°F | Ht 67.0 in | Wt 298.8 lb

## 2022-03-03 DIAGNOSIS — R053 Chronic cough: Secondary | ICD-10-CM

## 2022-03-03 DIAGNOSIS — J4540 Moderate persistent asthma, uncomplicated: Secondary | ICD-10-CM

## 2022-03-03 MED ORDER — TRELEGY ELLIPTA 200-62.5-25 MCG/ACT IN AEPB: 1.0000 | INHALATION_SPRAY | Freq: Every day | RESPIRATORY_TRACT | 3 refills | Status: DC

## 2022-03-03 MED ORDER — BENZONATATE 200 MG PO CAPS: 200.0000 mg | ORAL_CAPSULE | Freq: Three times a day (TID) | ORAL | 2 refills | Status: DC | PRN

## 2022-03-03 NOTE — Patient Instructions (Addendum)
Nice to see you  Start Trelegy 1 puff daily - stop Advair when you start  Rinse mouth with water after every use of Trelegy  Continue albuterol as needed  Use tessalon perles as needed for cough  Return to clinic in 3 months or sooner as needed

## 2022-03-03 NOTE — Progress Notes (Signed)
@Patient  ID: , female    DOB: [birth year], 51 y.o.   MRN: [id number]  Chief Complaint  Patient presents with   Consult    Consult for asthma. Pt states she has been on 4 different types of ABTs for the last few weeks. Pt states in the morning she has a constant cough. The only thing that helps is tessalon pearls. Pt is on Advair daily and Ventolin as needed     Referring provider: [id number], *  HPI:   51 y.o. woman whom are seen in consultation for evaluation of cough, dyspnea, bronchitis symptoms, history of asthma.  Note from referring provider reviewed.  She notes history of recurrent bronchitis about once a year.  Usually in December.  No more than once a year but reliably usually once a year.  Symptoms last a week or so.  Cough sputum production etc.  Gradually improved.  She has been dealing with recurrent case of bronchitis for the last month.  Ongoing cough.  Lasting much longer than typical.  Mild dyspnea.  Some chest discomfort or tightness.  No real pain but sensation of not getting a deep breath in her upper chest.  She is been using Advair mid dose Diskus twice daily with mild improvement.  Albuterol helps some.  Has use of prednisone course and that seemed to help but only for a few days and symptoms recur.  She has had lingering worsening mild dyspnea cough at work for the preceding months.  Associated with mildew exposure.  Concern for mold exposure.  A week out of work and things are a bit better.  Not a lot improved on the weekends when she is not working.  Otherwise, no time of day when symptoms are better or worse.  No consistent weakness or worse.  No other alleviating or exacerbating factors.  She had a chest x-ray 01/2022 on my review interpretation reveals clear lungs with mild hyperinflation on the lateral view.  Compared to previous chest x-ray 05/2021 similar appearance of mild hyperinflation on the lateral with clear lungs otherwise on my  review interpretation.  PMH: Asthma, migraines Surgical history: Cervical spine surgery, appendectomy Family history: Migraines, COPD, CHF in mother, father with Hodgkin's lymphoma Social history: Never smoker, lives in [place], is the wife of Jo Jackson who many our group took care of after prolonged COVID hospitalization and subsequent respiratory failure and tracheostomy dependence who unfortunately passed away in summer 2022   Questionaires / Pulmonary Flowsheets:   ACT:  Asthma Control Test ACT Total Score  03/03/2022  9:54 AM 14    MMRC:     No data to display          Epworth:      No data to display          Tests:   FENO:  No results found for: "NITRICOXIDE"  PFT:     No data to display          WALK:      No data to display          Imaging: Personally reviewed and as per EMR and discussion in this note 05/03/2022 Venous Img Lower Bilateral  Result Date: 02/07/2022 CLINICAL DATA:  Bilateral mid popliteal pain on examination. EXAM: BILATERAL LOWER EXTREMITY VENOUS DOPPLER ULTRASOUND TECHNIQUE: Gray-scale sonography with graded compression, as well as color Doppler and duplex ultrasound were performed to evaluate the lower extremity deep venous systems from the level of the common  femoral vein and including the common femoral, femoral, profunda femoral, popliteal and calf veins including the posterior tibial, peroneal and gastrocnemius veins when visible. The superficial great saphenous vein was also interrogated. Spectral Doppler was utilized to evaluate flow at rest and with distal augmentation maneuvers in the common femoral, femoral and popliteal veins. COMPARISON:  None Available. FINDINGS: RIGHT LOWER EXTREMITY Common Femoral Vein: No evidence of thrombus. Normal compressibility, respiratory phasicity and response to augmentation. Saphenofemoral Junction: No evidence of thrombus. Normal compressibility and flow on color Doppler imaging. Profunda  Femoral Vein: No evidence of thrombus. Normal compressibility and flow on color Doppler imaging. Femoral Vein: No evidence of thrombus. Normal compressibility, respiratory phasicity and response to augmentation. Popliteal Vein: No evidence of thrombus. Normal compressibility, respiratory phasicity and response to augmentation. Calf Veins: No evidence of thrombus. Normal compressibility and flow on color Doppler imaging. Superficial Great Saphenous Vein: No evidence of thrombus. Normal compressibility. Venous Reflux:  None. Other Findings:  None. LEFT LOWER EXTREMITY Common Femoral Vein: No evidence of thrombus. Normal compressibility, respiratory phasicity and response to augmentation. Saphenofemoral Junction: No evidence of thrombus. Normal compressibility and flow on color Doppler imaging. Profunda Femoral Vein: No evidence of thrombus. Normal compressibility and flow on color Doppler imaging. Femoral Vein: No evidence of thrombus. Normal compressibility, respiratory phasicity and response to augmentation. Popliteal Vein: No evidence of thrombus. Normal compressibility, respiratory phasicity and response to augmentation. Calf Veins: No evidence of thrombus. Normal compressibility and flow on color Doppler imaging. Superficial Great Saphenous Vein: No evidence of thrombus. Normal compressibility. Venous Reflux:  None. Other Findings:  None. IMPRESSION: No evidence of deep venous thrombosis in either lower extremity. Electronically Signed   By: Larose Hires D.O.   On: 02/07/2022 15:30   DG Chest 2 View  Result Date: 02/07/2022 CLINICAL DATA:  Shortness of breath beginning today. EXAM: CHEST - 2 VIEW COMPARISON:  06/07/2021 FINDINGS: Heart size is normal. Mediastinal shadows are normal. The lungs are clear. No infiltrate, collapse or effusion. Ordinary degenerative changes affect the thoracic spine. IMPRESSION: No active cardiopulmonary disease. Electronically Signed   By: Paulina Fusi M.D.   On: 02/07/2022 14:55     Lab Results: Personally reviewed CBC    Component Value Date/Time   WBC 3.9 (L) 09/27/2021 0847   RBC 4.93 09/27/2021 0847   HGB 14.7 09/27/2021 0847   HCT 41.9 09/27/2021 0847   PLT 235.0 09/27/2021 0847   MCV 85.1 09/27/2021 0847   MCH 28.5 08/20/2015 1627   MCHC 35.0 09/27/2021 0847   RDW 13.1 09/27/2021 0847   LYMPHSABS 1.1 09/27/2021 0847   MONOABS 0.4 09/27/2021 0847   EOSABS 0.1 09/27/2021 0847   BASOSABS 0.0 09/27/2021 0847    BMET    Component Value Date/Time   NA 136 09/27/2021 0847   K 4.4 09/27/2021 0847   CL 101 09/27/2021 0847   CO2 28 09/27/2021 0847   GLUCOSE 93 09/27/2021 0847   BUN 13 09/27/2021 0847   CREATININE 0.59 09/27/2021 0847   CREATININE 0.64 09/26/2013 1614   CALCIUM 9.7 09/27/2021 0847   GFRNONAA >90 12/13/2012 1900   GFRAA >90 12/13/2012 1900    BNP No results found for: "BNP"  ProBNP No results found for: "PROBNP"  Specialty Problems       Pulmonary Problems   SINUSITIS - ACUTE-NOS    Qualifier: Diagnosis of  By: Beverely Low MD, Katherine        Bronchitis   Viral URI   Pansinusitis   Influenza  Allergies  Allergen Reactions   Ketorolac Tromethamine Hives   Morphine Other (See Comments)    "makes me the devil."   Latex Rash and Other (See Comments)    Latex catheter caused extreme irritation    Immunization History  Administered Date(s) Administered   Influenza Whole 04/26/2009, 04/21/2010   Influenza,inj,Quad PF,6+ Mos 04/23/2013, 04/22/2015, 05/11/2016, 05/10/2017, 03/22/2018, 05/23/2019, 06/19/2020, 04/15/2021   Influenza-Unspecified 06/19/2020   Janssen (J&J) SARS-COV-2 Vaccination 10/04/2019, 06/19/2020   Moderna Sars-Covid-2 Vaccination 06/19/2020   PFIZER(Purple Top)SARS-COV-2 Vaccination 04/01/2021   PNEUMOCOCCAL CONJUGATE-20 04/01/2021    Past Medical History:  Diagnosis Date   Abnormal Pap smear    Repeats WNL   ADD (attention deficit disorder)    Anxiety    Arthritis    Oseteoarthritis    Depression    GERD (gastroesophageal reflux disease)    Headache(784.0)    Migraines   Junctional escape rhythm    Neuromuscular disorder (HCC)    Fibromyalgia   Pregnancy induced hypertension    Supraventricular tachycardia (HCC)     Tobacco History: Social History   Tobacco Use  Smoking Status Former   Types: Cigarettes   Quit date: 10/23/2007   Years since quitting: 14.3  Smokeless Tobacco Never   Counseling given: Not Answered   Continue to not smoke  Outpatient Encounter Medications as of 03/03/2022  Medication Sig   albuterol (PROVENTIL) (2.5 MG/3ML) 0.083% nebulizer solution Take 3 mLs (2.5 mg total) by nebulization every 6 (six) hours as needed for wheezing or shortness of breath.   albuterol (VENTOLIN HFA) 108 (90 Base) MCG/ACT inhaler TAKE 2 PUFFS BY MOUTH EVERY 6 HOURS AS NEEDED FOR WHEEZE OR SHORTNESS OF BREATH   amphetamine-dextroamphetamine (ADDERALL) 30 MG tablet Take 1 tablet by mouth 2 (two) times daily.   Azelastine HCl 137 MCG/SPRAY SOLN Place into both nostrils.   benzonatate (TESSALON) 200 MG capsule Take 1 capsule (200 mg total) by mouth 3 (three) times daily as needed for cough.   Erenumab-aooe (AIMOVIG) 140 MG/ML SOAJ Inject 140 mg into the skin every 28 (twenty-eight) days.   Fluticasone-Umeclidin-Vilant (TRELEGY ELLIPTA) 200-62.5-25 MCG/ACT AEPB Inhale 1 puff into the lungs daily.   HYDROcodone-acetaminophen (NORCO) 10-325 MG tablet Take 1 tablet by mouth every 6 (six) hours as needed.   meclizine (ANTIVERT) 25 MG tablet TAKE 1 TABLET BY MOUTH 3 TIMES DAILY AS NEEDED FOR DIZZINESS.   mometasone (NASONEX) 50 MCG/ACT nasal spray PLACE 2 SPRAYS INTO THE NOSE DAILY.   norethindrone (INCASSIA) 0.35 MG tablet TAKE 1 TABLET BY MOUTH EVERY DAY   ondansetron (ZOFRAN-ODT) 8 MG disintegrating tablet DISSOLVE 1 TABLET ON TONGUE EVERY 8 HOURS AS NEEDED FOR NAUSEA AND VOMITING   promethazine-dextromethorphan (PROMETHAZINE-DM) 6.25-15 MG/5ML syrup Take 5 mLs by mouth 4  (four) times daily as needed.   SUMAtriptan (IMITREX) 100 MG tablet TAKE 1 TABLET (100 MG TOTAL) BY MOUTH EVERY 2 (TWO) HOURS AS NEEDED FOR MIGRAINE. MAY REPEAT IN 2 HOURS IF HEADACHE PERSISTS OR RECURS.   SUMAtriptan (IMITREX) 50 MG tablet Take 1 tablet (50 mg total) by mouth every 2 (two) hours as needed for migraine. May repeat in 2 hours if headache persists or recurs.   tiZANidine (ZANAFLEX) 4 MG tablet Take 1 tablet (4 mg total) by mouth every 6 (six) hours as needed for muscle spasms.   traMADol (ULTRAM) 50 MG tablet Take 1 tablet (50 mg total) by mouth every 8 (eight) hours as needed.   Ubrogepant (UBRELVY) 50 MG TABS 1 po x1, may  repeat after 2 hours   [DISCONTINUED] amoxicillin-clavulanate (AUGMENTIN) 875-125 MG tablet Take 1 tablet by mouth 2 (two) times daily.   [DISCONTINUED] fluticasone-salmeterol (ADVAIR DISKUS) 250-50 MCG/ACT AEPB Inhale 1 puff into the lungs in the morning and at bedtime.   [DISCONTINUED] predniSONE (STERAPRED UNI-PAK 21 TAB) 10 MG (21) TBPK tablet Standard Taper over 6 days.   [DISCONTINUED] TRINTELLIX 10 MG TABS tablet Take 30 mg by mouth daily at 2 PM.   [DISCONTINUED] vortioxetine HBr (TRINTELLIX) 10 MG TABS tablet Take 1 tablet (10 mg total) by mouth daily. Start with 10mg  a day for 5 days and then 2 a day.   No facility-administered encounter medications on file as of 03/03/2022.     Review of Systems  Review of Systems  No chest pain with exertion.  No orthopnea or PND.  Comprehensive review of systems otherwise negative. Physical Exam  BP 134/72 (BP Location: Left Arm, Patient Position: Sitting, Cuff Size: Normal)   Pulse 95   Temp 98.4 F (36.9 C) (Oral)   Ht 5\' 7"  (1.702 m)   Wt 298 lb 12.8 oz (135.5 kg)   SpO2 97%   BMI 46.80 kg/m   Wt Readings from Last 5 Encounters:  03/03/22 298 lb 12.8 oz (135.5 kg)  02/07/22 300 lb (136.1 kg)  01/19/22 295 lb 12.8 oz (134.2 kg)  01/03/22 295 lb 3.2 oz (133.9 kg)  11/03/21 287 lb 9.6 oz (130.5 kg)     BMI Readings from Last 5 Encounters:  03/03/22 46.80 kg/m  02/07/22 46.99 kg/m  01/19/22 46.33 kg/m  01/03/22 46.23 kg/m  11/03/21 45.04 kg/m     Physical Exam General: Well-appearing, sitting in chair Eyes: EOMI, no icterus Neck: Supple, no JVP Pulmonary: Clear, normal work of breathing Cardiovascular: Warm, no edema Abdomen: Nondistended, sounds present MSK: No synovitis, no joint effusion Neuro: Normal gait, no weakness Psych: Normal mood, full affect   Assessment & Plan:   Recurrent bronchitis, prolonged cough, mild dyspnea: Suspicious for poorly controlled asthma.  Mild temporary improvement with albuterol as well as prednisone courses.  Chest imaging clear.  Tessalon Perles prescribed today.  Please see below.  If not improving, pursue PFTs in the future.  Asthma: Longstanding.  Symptoms worse since mildew exposure.  Possible sensitizer/allergen.  Escalate mid dose Advair discus to high-dose Trelegy 1 puff daily.  Continue albuterol as needed.   Return in about 3 months (around 06/03/2022).   11/05/21, MD 03/03/2022

## 2022-03-15 NOTE — Progress Notes (Unsigned)
NEUROLOGY FOLLOW UP OFFICE NOTE  Jo Jackson [id number]  Assessment/Plan:   Migraine without aura, without status migrainosus, not intractable   Migraine prevention:  Start Aimovig 140mg  every 28 days Migraine rescue:  Stop sumatriptan.  She will try Maxalt-MLT 10mg .  May also increase Ubrelvy and try 100mg .  Zofran for nausea. Limit use of pain relievers to no more than 2 days out of week to prevent risk of rebound or medication-overuse headache. Keep headache diary Follow up 7 months.       Subjective:  Jo Jackson is a 51 year old right-handed female with asthma, ADD, depression and migraine who for migraines  UPDATE: Started Aimovig. Intensity:  *** Duration:  *** Frequency:  *** Frequency of abortive medication: *** Current abortive management:  sumatriptan 50mg  or 100mg , once every 2 to 3 headaches may take Norco or tramadol Current NSAIDS/analgesics:  hydrocodone-acetaminophen, tramadol Current triptans:  rizatriptan 10mg  Current ergotamine:  none Current anti-emetic:  Zofran ODT 8mg  Current muscle relaxants:  tizanidine 4mg  PRN Current Antihypertensive medications:  metoprolol succinate 50mg  Current Antidepressant medications:  Trintellix Current Anticonvulsant medications:  none Current anti-CGRP:  Ubrelvy 100mg  Current Vitamins/Herbal/Supplements:  none Current Antihistamines/Decongestants:  meclizine Other therapy:  none Hormone/birth control:  none Other medications:  Adderall, albuterol    Caffeine:  Sweet tea Alcohol:  occasionally Smoker:  no Diet:  Needs to hydrate more Exercise:  Not routine Depression/stress:  better Sleep hygiene:  varies Other pain:  fibromyalgia. Family history of headache:  mom  HISTORY:  Onset:  She has had menstrually-related migraines since age 51, but they have been related to the neck since repeated cervical spinal fusions at C6-C7 over 10 years ago, as demonstrated on CT report from 2003. Location:   She develops spasm in the right trapezius which triggers pain in the back of head on the right and radiates to the right temple/eye Quality:  Squeezing, throbbing Intensity:  7/10 Aura:  no Prodrome:  no Associated symptoms:  Both eyes hurt with or without headache.  Photophobia, phonophobia.  Occasional nausea.  No visual disturbance. Duration:  1 hour with medication but can last 2-3 days approximately 1 to 2 times a month Frequency:  During summer months, 5 days a week.  Otherwise, 3 a month Triggers/exacerbating factors:  Previously, it was her period (until she started taking Depo shot), now neck spasms Relieving factors:  Rest, tizanidine Activity:  functions   History of cervical fusion.  CT cervical spine on 08/02/2001 demonstrated:  1.  ANTERIOR AND POSTERIOR FUSION AT C6-7 WITH FRACTURE OF THE SCREWS AT C6.  NO CHANGE IN  ALIGNMENT WITH APPARENT FUSION AT THE LEVEL OF THE INTERBODY FUSION PLUG.  2.  QUESTIONABLE BULGING ANNULUS AND/OR HERNIATED DISC CENTRALLY  AND TO THE LEFT AT THE C7-T1 LEVEL.  IF THE PRIOR STUDIES CANNOT BE LOCATED FOR COMPARISON,  THEN CT MYELOGRAPHY MAY BE  INDICATED TO ASSESS THIS AREA FURTHER. CT MULTIPLANAR RECONSTRUCTION.  THE AXIAL SCANS WERE RECONSTRUCTED IN BOTH SAGITTAL AND CORONAL PLAINS.  THERE DOES APPEAR TO BE  GOOD BONY FUSION AT THE LEVEL OF THE INTERBODY FUSION PLUG AT C6-7, BUT THE REMAINDER OF THE INTERVERTEBRAL SPACE DOES NOT APPEAR TO BE SOLIDLY FUSED.  NORMAL ALIGNMENT IS MAINTAINED IN THE  SAGITALLY RECONSTRUCTED IMAGES.    Past NSAIDS/analgesics:  meloxicam, Tylenol, ibuprofen 800mg  Past abortive triptans:  sumatriptan [place]/tab Past abortive ergotamine:  none Past muscle relaxants:  cyclobenzaprine, methocarbamol, SOma Past anti-emetic:  none Past antihypertensive medications:  propranolol Past  antidepressant medications:  venlafaxine, duloxetine, Viibryd Past anticonvulsant medications:  topiramate Past anti-CGRP:  none Past  vitamins/Herbal/Supplements:  none Past antihistamines/decongestants:  Nasonex Other past therapies:  none    PAST MEDICAL HISTORY: Past Medical History:  Diagnosis Date   Abnormal Pap smear    Repeats WNL   ADD (attention deficit disorder)    Anxiety    Arthritis    Oseteoarthritis   Depression    GERD (gastroesophageal reflux disease)    Headache(784.0)    Migraines   Junctional escape rhythm    Neuromuscular disorder (HCC)    Fibromyalgia   Pregnancy induced hypertension    Supraventricular tachycardia (HCC)     MEDICATIONS: Current Outpatient Medications on File Prior to Visit  Medication Sig Dispense Refill   albuterol (PROVENTIL) (2.5 MG/3ML) 0.083% nebulizer solution Take 3 mLs (2.5 mg total) by nebulization every 6 (six) hours as needed for wheezing or shortness of breath. 75 mL 12   albuterol (VENTOLIN HFA) 108 (90 Base) MCG/ACT inhaler TAKE 2 PUFFS BY MOUTH EVERY 6 HOURS AS NEEDED FOR WHEEZE OR SHORTNESS OF BREATH 18 g 5   amphetamine-dextroamphetamine (ADDERALL) 30 MG tablet Take 1 tablet by mouth 2 (two) times daily. 180 tablet 0   Azelastine HCl 137 MCG/SPRAY SOLN Place into both nostrils.     benzonatate (TESSALON) 200 MG capsule Take 1 capsule (200 mg total) by mouth 3 (three) times daily as needed for cough. 90 capsule 2   buPROPion (WELLBUTRIN XL) 150 MG 24 hr tablet Take 150 mg by mouth daily.     citalopram (CELEXA) 20 MG tablet Take 20 mg by mouth daily.     Erenumab-aooe (AIMOVIG) 140 MG/ML SOAJ Inject 140 mg into the skin every 28 (twenty-eight) days. 1 mL 1   Fluticasone-Umeclidin-Vilant (TRELEGY ELLIPTA) 200-62.5-25 MCG/ACT AEPB Inhale 1 puff into the lungs daily. 60 each 3   HYDROcodone-acetaminophen (NORCO) 10-325 MG tablet Take 1 tablet by mouth every 6 (six) hours as needed. 30 tablet 0   meclizine (ANTIVERT) 25 MG tablet TAKE 1 TABLET BY MOUTH 3 TIMES DAILY AS NEEDED FOR DIZZINESS. 30 tablet 0   mometasone (NASONEX) 50 MCG/ACT nasal spray PLACE 2  SPRAYS INTO THE NOSE DAILY. 51 each 4   norethindrone (INCASSIA) 0.35 MG tablet TAKE 1 TABLET BY MOUTH EVERY DAY 84 tablet 3   ondansetron (ZOFRAN-ODT) 8 MG disintegrating tablet DISSOLVE 1 TABLET ON TONGUE EVERY 8 HOURS AS NEEDED FOR NAUSEA AND VOMITING 20 tablet 1   promethazine-dextromethorphan (PROMETHAZINE-DM) 6.25-15 MG/5ML syrup Take 5 mLs by mouth 4 (four) times daily as needed. 118 mL 0   SUMAtriptan (IMITREX) 100 MG tablet TAKE 1 TABLET (100 MG TOTAL) BY MOUTH EVERY 2 (TWO) HOURS AS NEEDED FOR MIGRAINE. MAY REPEAT IN 2 HOURS IF HEADACHE PERSISTS OR RECURS. 10 tablet 5   SUMAtriptan (IMITREX) 50 MG tablet Take 1 tablet (50 mg total) by mouth every 2 (two) hours as needed for migraine. May repeat in 2 hours if headache persists or recurs. 10 tablet 0   tiZANidine (ZANAFLEX) 4 MG tablet Take 1 tablet (4 mg total) by mouth every 6 (six) hours as needed for muscle spasms. 90 tablet 1   traMADol (ULTRAM) 50 MG tablet Take 1 tablet (50 mg total) by mouth every 8 (eight) hours as needed. 30 tablet 0   Ubrogepant (UBRELVY) 50 MG TABS 1 po x1, may repeat after 2 hours 30 tablet 2   No current facility-administered medications on file prior to visit.  ALLERGIES: Allergies  Allergen Reactions   Ketorolac Tromethamine Hives   Morphine Other (See Comments)    "makes me the devil."   Latex Rash and Other (See Comments)    Latex catheter caused extreme irritation    FAMILY HISTORY: Family History  Problem Relation Age of Onset   Migraines Mother    COPD Mother    Heart disease Mother        chf   Depression Mother    Hodgkin's lymphoma Father    ADD / ADHD Brother    Cancer Paternal Aunt        breast   Diabetes Other    Thyroid disease Other    Hypertension Other    Depression Other    Bipolar disorder Other       Objective:  *** General: No acute distress.  Patient appears well-groomed.   Head:  Normocephalic/atraumatic Eyes:  Fundi examined but not visualized Neck:  supple, no paraspinal tenderness, full range of motion Heart:  Regular rate and rhythm Neurological Exam: alert and oriented to person, place, and time.  Speech fluent and not dysarthric, language intact.  CN II-XII intact. Bulk and tone normal, muscle strength 5/5 throughout.  Sensation to light touch intact.  Deep tendon reflexes 2+ throughout, toes downgoing.  Finger to nose testing intact.  Gait normal, Romberg negative.   Shon Millet, DO  CC: Seabron Spates, DO

## 2022-03-16 ENCOUNTER — Encounter: Payer: Self-pay | Admitting: Neurology

## 2022-03-16 ENCOUNTER — Telehealth: Payer: Self-pay

## 2022-03-16 ENCOUNTER — Other Ambulatory Visit (HOSPITAL_COMMUNITY): Payer: Self-pay

## 2022-03-16 ENCOUNTER — Ambulatory Visit (INDEPENDENT_AMBULATORY_CARE_PROVIDER_SITE_OTHER): Payer: BC Managed Care – PPO | Admitting: Neurology

## 2022-03-16 ENCOUNTER — Telehealth (HOSPITAL_COMMUNITY): Payer: Self-pay | Admitting: Pharmacy Technician

## 2022-03-16 VITALS — BP 168/96 | HR 83 | Ht 68.0 in | Wt 296.2 lb

## 2022-03-16 DIAGNOSIS — G43709 Chronic migraine without aura, not intractable, without status migrainosus: Secondary | ICD-10-CM | POA: Diagnosis not present

## 2022-03-16 DIAGNOSIS — R030 Elevated blood-pressure reading, without diagnosis of hypertension: Secondary | ICD-10-CM

## 2022-03-16 MED ORDER — ELETRIPTAN HYDROBROMIDE 40 MG PO TABS: 40.0000 mg | ORAL_TABLET | ORAL | 5 refills | Status: DC | PRN

## 2022-03-16 NOTE — Telephone Encounter (Signed)
Patient seen in office today.  Dr.Jaffe would like to start Botox.  PA team Please start a PA for Botox 200 units

## 2022-03-16 NOTE — Patient Instructions (Addendum)
Stop Aimovig.  Start Botox Stop sumatriptan, ibuprofen and BC Take eletriptan for headache attacks.  Also try Nurtec (one in 24 hours).  Let me know if either are effective or not. Limit use of pain relievers to no more than 2 days out of week to prevent risk of rebound or medication-overuse headache. Keep headache diary Follow up for Botox Follow up with PCP regarding blood pressure

## 2022-03-16 NOTE — Telephone Encounter (Signed)
Patient Advocate Encounter   Received notification that prior authorization for Botox 200UNIT solution is required.   PA submitted on 03/16/2022 Key [id number] Status is pending       Roland Earl, CPhT Pharmacy Patient Advocate Specialist [facility] Patient Advocate Team Direct Number: [phone]  Fax: [phone]

## 2022-03-16 NOTE — Progress Notes (Signed)
Medication Samples have been provided to the patient.  Drug name: Nuetec       Strength: 75 mg        Qty: 3  LOT: [id number]  Exp.Date: 08/2023  Dosing instructions: as needed  The patient has been instructed regarding the correct time, dose, and frequency of taking this medication, including desired effects and most common side effects.   Leida Lauth 11:18 AM 03/16/2022

## 2022-03-17 ENCOUNTER — Other Ambulatory Visit (HOSPITAL_COMMUNITY): Payer: Self-pay

## 2022-03-17 NOTE — Telephone Encounter (Signed)
Patient Advocate Encounter  Prior Authorization for Botox 200UNIT solution has been approved.    PA# [id number] Effective dates: 03/16/2022 through 09/16/2022  Can be filled at [facility]    Roland Earl, CPhT Pharmacy Patient Advocate Specialist [facility] Patient Advocate Team Direct Number: [phone]  Fax: [phone]

## 2022-03-20 ENCOUNTER — Ambulatory Visit: Payer: BC Managed Care – PPO | Admitting: Neurology

## 2022-03-20 ENCOUNTER — Telehealth: Payer: Self-pay | Admitting: Pulmonary Disease

## 2022-03-20 NOTE — Telephone Encounter (Signed)
Patient dropped FMLA paperwork at [facility] office on 03/15/22.   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  She has not missed any work, but this form will allow her to get paid when she needs to miss work.  She works on the 2nd floor of the distribution center where it is very dusty, moldy, no air conditioning, very hot and humid.  These conditions cause her to have asthma flare-ups, and if she is not able to control them with her inhaler and other medications, she needs to leave work.  This has only happened 1-2 times.  I completed as much of the form as I could and let the patient know Dr. Judeth Horn will sign it next week when he is in office.  She will need to come pick it up at the office because it will need her signature.  Then she will make sure it gets sent to [facility].

## 2022-03-22 ENCOUNTER — Other Ambulatory Visit (HOSPITAL_COMMUNITY): Payer: Self-pay

## 2022-03-22 MED ORDER — BOTOX 200 UNITS IJ SOLR
200.0000 [IU] | INTRAMUSCULAR | 4 refills | Status: DC
  Filled 2022-03-22 (×2): qty 1, 90d supply, fill #0
  Filled 2022-06-14: qty 1, 90d supply, fill #1
  Filled 2022-09-14: qty 1, 90d supply, fill #2

## 2022-03-22 NOTE — Telephone Encounter (Signed)
Patient advised of Approval for Botox.  Jo Jackson can you call patient to schedule for 04/07/22.

## 2022-03-22 NOTE — Telephone Encounter (Signed)
Patient is sch for 04-07-22

## 2022-03-23 ENCOUNTER — Encounter: Payer: Self-pay | Admitting: Neurology

## 2022-03-23 ENCOUNTER — Other Ambulatory Visit: Payer: Self-pay

## 2022-03-23 MED ORDER — ELETRIPTAN HYDROBROMIDE 40 MG PO TABS: 40.0000 mg | ORAL_TABLET | ORAL | 5 refills | Status: DC | PRN

## 2022-03-29 ENCOUNTER — Other Ambulatory Visit (HOSPITAL_COMMUNITY): Payer: Self-pay

## 2022-03-30 ENCOUNTER — Other Ambulatory Visit (HOSPITAL_COMMUNITY): Payer: Self-pay

## 2022-04-03 ENCOUNTER — Other Ambulatory Visit (HOSPITAL_COMMUNITY)
Admission: RE | Admit: 2022-04-03 | Discharge: 2022-04-03 | Disposition: A | Discharge status: Home / Self Care | Payer: BC Managed Care – PPO | Source: Ambulatory Visit | Attending: Family Medicine | Admitting: Family Medicine

## 2022-04-03 ENCOUNTER — Ambulatory Visit (INDEPENDENT_AMBULATORY_CARE_PROVIDER_SITE_OTHER): Payer: BC Managed Care – PPO | Admitting: Family Medicine

## 2022-04-03 ENCOUNTER — Encounter: Payer: Self-pay | Admitting: Family Medicine

## 2022-04-03 ENCOUNTER — Other Ambulatory Visit (HOSPITAL_COMMUNITY): Payer: Self-pay

## 2022-04-03 VITALS — BP 140/80 | HR 80 | Temp 98.0°F | Resp 16 | Ht 67.0 in | Wt 286.2 lb

## 2022-04-03 DIAGNOSIS — Z30011 Encounter for initial prescription of contraceptive pills: Secondary | ICD-10-CM | POA: Diagnosis not present

## 2022-04-03 DIAGNOSIS — I10 Essential (primary) hypertension: Secondary | ICD-10-CM

## 2022-04-03 DIAGNOSIS — Z0000 Encounter for general adult medical examination without abnormal findings: Secondary | ICD-10-CM | POA: Diagnosis present

## 2022-04-03 DIAGNOSIS — Z6841 Body Mass Index (BMI) 40.0 and over, adult: Secondary | ICD-10-CM

## 2022-04-03 DIAGNOSIS — M797 Fibromyalgia: Secondary | ICD-10-CM

## 2022-04-03 DIAGNOSIS — Z1159 Encounter for screening for other viral diseases: Secondary | ICD-10-CM

## 2022-04-03 DIAGNOSIS — K625 Hemorrhage of anus and rectum: Secondary | ICD-10-CM | POA: Diagnosis not present

## 2022-04-03 DIAGNOSIS — Z79899 Other long term (current) drug therapy: Secondary | ICD-10-CM

## 2022-04-03 LAB — LIPID PANEL
Cholesterol: 170 mg/dL (ref 0–200)
HDL: 63.2 mg/dL (ref >39.00)
LDL Cholesterol: 96 mg/dL (ref 0–99)
NonHDL: 107.25
Total CHOL/HDL Ratio: 3
Triglycerides: 56 mg/dL (ref 0.0–149.0)
VLDL: 11.2 mg/dL (ref 0.0–40.0)

## 2022-04-03 LAB — CBC WITH DIFFERENTIAL/PLATELET
Basophils Absolute: 0 10*3/uL (ref 0.0–0.1)
Basophils Relative: 0.5 % (ref 0.0–3.0)
Eosinophils Absolute: 0.1 10*3/uL (ref 0.0–0.7)
Eosinophils Relative: 1.3 % (ref 0.0–5.0)
HCT: 40.7 % (ref 36.0–46.0)
Hemoglobin: 14 g/dL (ref 12.0–15.0)
Lymphocytes Relative: 24.3 % (ref 12.0–46.0)
Lymphs Abs: 1.2 10*3/uL (ref 0.7–4.0)
MCHC: 34.5 g/dL (ref 30.0–36.0)
MCV: 84.2 fl (ref 78.0–100.0)
Monocytes Absolute: 0.5 10*3/uL (ref 0.1–1.0)
Monocytes Relative: 9.5 % (ref 3.0–12.0)
Neutro Abs: 3.2 10*3/uL (ref 1.4–7.7)
Neutrophils Relative %: 64.4 % (ref 43.0–77.0)
Platelets: 218 10*3/uL (ref 150.0–400.0)
RBC: 4.83 Mil/uL (ref 3.87–5.11)
RDW: 12.9 % (ref 11.5–15.5)
WBC: 5 10*3/uL (ref 4.0–10.5)

## 2022-04-03 LAB — COMPREHENSIVE METABOLIC PANEL
ALT: 19 U/L (ref 0–35)
AST: 19 U/L (ref 0–37)
Albumin: 4.1 g/dL (ref 3.5–5.2)
Alkaline Phosphatase: 62 U/L (ref 39–117)
BUN: 9 mg/dL (ref 6–23)
CO2: 30 mEq/L (ref 19–32)
Calcium: 9.3 mg/dL (ref 8.4–10.5)
Chloride: 99 mEq/L (ref 96–112)
Creatinine, Ser: 0.8 mg/dL (ref 0.40–1.20)
GFR: 86.04 mL/min (ref >60.00)
Glucose, Bld: 90 mg/dL (ref 70–99)
Potassium: 3.9 mEq/L (ref 3.5–5.1)
Sodium: 138 mEq/L (ref 135–145)
Total Bilirubin: 0.9 mg/dL (ref 0.2–1.2)
Total Protein: 6.9 g/dL (ref 6.0–8.3)

## 2022-04-03 LAB — POC HEMOCCULT BLD/STL (OFFICE/1-CARD/DIAGNOSTIC): Fecal Occult Blood, POC: POSITIVE — AB

## 2022-04-03 LAB — TSH: TSH: 2.03 u[IU]/mL (ref 0.35–5.50)

## 2022-04-03 MED ORDER — NORETHINDRONE 0.35 MG PO TABS: 1.0000 | ORAL_TABLET | Freq: Every day | ORAL | 11 refills | Status: DC

## 2022-04-03 MED ORDER — HYDROCODONE-ACETAMINOPHEN 10-325 MG PO TABS: 1.0000 | ORAL_TABLET | Freq: Four times a day (QID) | ORAL | 0 refills | Status: DC | PRN

## 2022-04-03 NOTE — Assessment & Plan Note (Signed)
ghm utd Check labs  See avs  

## 2022-04-03 NOTE — Patient Instructions (Signed)
Preventive Care 50-50 Years Old, Female Preventive care refers to lifestyle choices and visits with your health care provider that can promote health and wellness. Preventive care visits are also called wellness exams. What can I expect for my preventive care visit? Counseling Your health care provider may ask you questions about your: Medical history, including: Past medical problems. Family medical history. Pregnancy history. Current health, including: Menstrual cycle. Method of birth control. Emotional well-being. Home life and relationship well-being. Sexual activity and sexual health. Lifestyle, including: Alcohol, nicotine or tobacco, and drug use. Access to firearms. Diet, exercise, and sleep habits. Work and work environment. Sunscreen use. Safety issues such as seatbelt and bike helmet use. Physical exam Your health care provider will check your: Height and weight. These may be used to calculate your BMI (body mass index). BMI is a measurement that tells if you are at a healthy weight. Waist circumference. This measures the distance around your waistline. This measurement also tells if you are at a healthy weight and may help predict your risk of certain diseases, such as type 2 diabetes and high blood pressure. Heart rate and blood pressure. Body temperature. Skin for abnormal spots. What immunizations do I need?  Vaccines are usually given at various ages, according to a schedule. Your health care provider will recommend vaccines for you based on your age, medical history, and lifestyle or other factors, such as travel or where you work. What tests do I need? Screening Your health care provider may recommend screening tests for certain conditions. This may include: Lipid and cholesterol levels. Diabetes screening. This is done by checking your blood sugar (glucose) after you have not eaten for a while (fasting). Pelvic exam and Pap test. Hepatitis B test. Hepatitis C  test. HIV (human immunodeficiency virus) test. STI (sexually transmitted infection) testing, if you are at risk. Lung cancer screening. Colorectal cancer screening. Mammogram. Talk with your health care provider about when you should start having regular mammograms. This may depend on whether you have a family history of breast cancer. BRCA-related cancer screening. This may be done if you have a family history of breast, ovarian, tubal, or peritoneal cancers. Bone density scan. This is done to screen for osteoporosis. Talk with your health care provider about your test results, treatment options, and if necessary, the need for more tests. Follow these instructions at home: Eating and drinking  Eat a diet that includes fresh fruits and vegetables, whole grains, lean protein, and low-fat dairy products. Take vitamin and mineral supplements as recommended by your health care provider. Do not drink alcohol if: Your health care provider tells you not to drink. You are pregnant, may be pregnant, or are planning to become pregnant. If you drink alcohol: Limit how much you have to 0-1 drink a day. Know how much alcohol is in your drink. In the [place] one drink equals one 12 oz bottle of beer (355 mL), one 5 oz glass of wine (148 mL), or one 1 oz glass of hard liquor (44 mL). Lifestyle Brush your teeth every morning and night with fluoride toothpaste. Floss one time each day. Exercise for at least 30 minutes 5 or more days each week. Do not use any products that contain nicotine or tobacco. These products include cigarettes, chewing tobacco, and vaping devices, such as e-cigarettes. If you need help quitting, ask your health care provider. Do not use drugs. If you are sexually active, practice safe sex. Use a condom or other form of protection to   prevent STIs. If you do not wish to become pregnant, use a form of birth control. If you plan to become pregnant, see your health care provider for a  prepregnancy visit. Take aspirin only as told by your health care provider. Make sure that you understand how much to take and what form to take. Work with your health care provider to find out whether it is safe and beneficial for you to take aspirin daily. Find healthy ways to manage stress, such as: Meditation, yoga, or listening to music. Journaling. Talking to a trusted person. Spending time with friends and family. Minimize exposure to UV radiation to reduce your risk of skin cancer. Safety Always wear your seat belt while driving or riding in a vehicle. Do not drive: If you have been drinking alcohol. Do not ride with someone who has been drinking. When you are tired or distracted. While texting. If you have been using any mind-altering substances or drugs. Wear a helmet and other protective equipment during sports activities. If you have firearms in your house, make sure you follow all gun safety procedures. Seek help if you have been physically or sexually abused. What's next? Visit your health care provider once a year for an annual wellness visit. Ask your health care provider how often you should have your eyes and teeth checked. Stay up to date on all vaccines. This information is not intended to replace advice given to you by your health care provider. Make sure you discuss any questions you have with your health care provider. Document Revised: 01/05/2021 Document Reviewed: 01/05/2021 Elsevier Patient Education  [place].

## 2022-04-03 NOTE — Assessment & Plan Note (Signed)
Refer to gi.  

## 2022-04-03 NOTE — Assessment & Plan Note (Signed)
Discussed diet and exercise 

## 2022-04-03 NOTE — Assessment & Plan Note (Signed)
Well controlled, no changes to meds. Encouraged heart healthy diet such as the DASH diet and exercise as tolerated.  °

## 2022-04-03 NOTE — Progress Notes (Signed)
Subjective:   By signing my name below, I, Cassell Clement, attest that this documentation has been prepared under the direction and in the presence of [name] DO 04/03/2022   Patient ID: Jo Jackson, female    DOB: 06-09-1971, 51 y.o.   MRN: [id number]  Chief Complaint  Patient presents with   Annual Exam    Here for Annual Exam    HPI Patient is in today for a comprehensive physical exam  She is requesting a refill of 10-325 Mg of Norco.   She complains of hot flashes. Symptoms mostly occur during the day. She is currently staying with her sister in law and reports that in the household the temperature is relatively warm. She reports that she is taking 40 Mg of Celexa and 300 Mg of Wellbutrin XL. She states that she experiences irregular spotting. Her paternal cousin has a history of breast cancer. She was previously on [place] birth control pills.   She reports that she is receiving botox on 04/07/2022.   She denies having any fever, new muscle pain, joint pain , new moles, congestion, sinus pain, sore throat, chest pain, palpations, cough, SOB ,wheezing,n/v/d constipation, blood in stool, dysuria, frequency, hematuria, at this time  Pap Smear last completed on 02/12/2017 Mammogram last completed on 04/09/2018 Immunizations: She states that she is moving around a lot due to moving.  She states that she cannot get a dental cleaning until 07/2022 due to scheduling.  She is UTD on vision exams.    Past Medical History:  Diagnosis Date   Abnormal Pap smear    Repeats WNL   ADD (attention deficit disorder)    Anxiety    Arthritis    Oseteoarthritis   Depression    GERD (gastroesophageal reflux disease)    Headache(784.0)    Migraines   Junctional escape rhythm    Neuromuscular disorder (HCC)    Fibromyalgia   Pregnancy induced hypertension    Supraventricular tachycardia (HCC)     Past Surgical History:  Procedure Laterality Date   ABLATION OF  DYSRHYTHMIC FOCUS     APPENDECTOMY  1986   CARPAL TUNNEL RELEASE  2000   CERVICAL FUSION  08-24-99,04-26-00,05-09-01   Knee sugery Left    16 yrs. old   SHOULDER SURGERY  left-6-04, right 09-12-04   tongue polyp  1984    Family History  Problem Relation Age of Onset   Migraines Mother    COPD Mother    Heart disease Mother        chf   Depression Mother    Hodgkin's lymphoma Father    ADD / ADHD Brother    Diabetes Other    Thyroid disease Other    Hypertension Other    Depression Other    Bipolar disorder Other    Cancer Cousin     Social History   Socioeconomic History   Marital status: Widowed    Spouse name: Not on file   Number of children: 2   Years of education: Not on file   Highest education level: Not on file  Occupational History    Employer: [employer]    Comment: P/T   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    Employer: [employer]  Tobacco Use   Smoking status: Former    Types: Cigarettes    Quit date: 10/23/2007    Years since quitting: 14.4   Smokeless tobacco: Never  Vaping Use   Vaping Use: Never used  Substance and  Sexual Activity   Alcohol use: Yes    Alcohol/week: 0.0 standard drinks of alcohol    Comment: Occ   Drug use: No   Sexual activity: Not Currently    Partners: Male  Other Topics Concern   Not on file  Social History Narrative   G2p2      Exercise- no      Right handed   Social Determinants of Health   Financial Resource Strain: Not on file  Food Insecurity: Not on file  Transportation Needs: Not on file  Physical Activity: Not on file  Stress: Not on file  Social Connections: Not on file  Intimate Partner Violence: Not on file    Outpatient Medications Prior to Visit  Medication Sig Dispense Refill   albuterol (PROVENTIL) (2.5 MG/3ML) 0.083% nebulizer solution Take 3 mLs (2.5 mg total) by nebulization every 6 (six) hours as needed for wheezing or shortness of breath. 75 mL 12   albuterol (VENTOLIN HFA) 108 (90 Base) MCG/ACT  inhaler TAKE 2 PUFFS BY MOUTH EVERY 6 HOURS AS NEEDED FOR WHEEZE OR SHORTNESS OF BREATH 18 g 5   amphetamine-dextroamphetamine (ADDERALL) 30 MG tablet Take 1 tablet by mouth 2 (two) times daily. 180 tablet 0   Azelastine HCl 137 MCG/SPRAY SOLN Place into both nostrils.     benzonatate (TESSALON) 200 MG capsule Take 1 capsule (200 mg total) by mouth 3 (three) times daily as needed for cough. 90 capsule 2   botulinum toxin Type A (BOTOX) 200 units injection Inject 200 Units into the muscle every 3 (three) months. Inject 155 units IM into multiple site in the face,neck and head once every 90 days 1 each 4   buPROPion (WELLBUTRIN XL) 300 MG 24 hr tablet Take 1 tablet (300 mg total) by mouth daily. 30 tablet 2   citalopram (CELEXA) 40 MG tablet Take 1 tablet (40 mg total) by mouth daily. 30 tablet 3   eletriptan (RELPAX) 40 MG tablet Take 1 tablet (40 mg total) by mouth as needed for migraine or headache. May repeat in 2 hours if headache persists or recurs.  Maximum 2 tablets in 24 hours. 10 tablet 5   Erenumab-aooe (AIMOVIG) 140 MG/ML SOAJ Inject 140 mg into the skin every 28 (twenty-eight) days. 1 mL 1   Fluticasone-Umeclidin-Vilant (TRELEGY ELLIPTA) 200-62.5-25 MCG/ACT AEPB Inhale 1 puff into the lungs daily. 60 each 3   meclizine (ANTIVERT) 25 MG tablet TAKE 1 TABLET BY MOUTH 3 TIMES DAILY AS NEEDED FOR DIZZINESS. 30 tablet 0   mometasone (NASONEX) 50 MCG/ACT nasal spray PLACE 2 SPRAYS INTO THE NOSE DAILY. 51 each 4   ondansetron (ZOFRAN-ODT) 8 MG disintegrating tablet DISSOLVE 1 TABLET ON TONGUE EVERY 8 HOURS AS NEEDED FOR NAUSEA AND VOMITING 20 tablet 1   tiZANidine (ZANAFLEX) 4 MG tablet Take 1 tablet (4 mg total) by mouth every 6 (six) hours as needed for muscle spasms. 90 tablet 1   traMADol (ULTRAM) 50 MG tablet Take 1 tablet (50 mg total) by mouth every 8 (eight) hours as needed. 30 tablet 0   buPROPion (WELLBUTRIN XL) 150 MG 24 hr tablet Take 150 mg by mouth daily.     citalopram (CELEXA) 20  MG tablet Take 20 mg by mouth daily.     HYDROcodone-acetaminophen (NORCO) 10-325 MG tablet Take 1 tablet by mouth every 6 (six) hours as needed. 30 tablet 0   promethazine-dextromethorphan (PROMETHAZINE-DM) 6.25-15 MG/5ML syrup Take 5 mLs by mouth 4 (four) times daily as needed. (Patient not  taking: Reported on 04/03/2022) 118 mL 0   No facility-administered medications prior to visit.    Allergies  Allergen Reactions   Ketorolac Tromethamine Hives   Morphine Other (See Comments)    "makes me the devil."   Latex Rash and Other (See Comments)    Latex catheter caused extreme irritation    Review of Systems  Constitutional:  Negative for fever.       (+) Hot Flashes  HENT:  Negative for congestion, sinus pain and sore throat.   Respiratory:  Negative for cough, shortness of breath and wheezing.   Cardiovascular:  Negative for chest pain and palpitations.  Gastrointestinal:  Negative for blood in stool, constipation, diarrhea, nausea and vomiting.  Genitourinary:  Negative for dysuria, frequency and hematuria.  Musculoskeletal:  Negative for joint pain and myalgias.  Skin:        (-) New Moles       Objective:    Physical Exam Vitals and nursing note reviewed.  Constitutional:      General: She is not in acute distress.    Appearance: Normal appearance. She is not ill-appearing.  HENT:     Head: Normocephalic and atraumatic.     Right Ear: Tympanic membrane, ear canal and external ear normal.     Left Ear: Tympanic membrane, ear canal and external ear normal.     Mouth/Throat:     Pharynx: No oropharyngeal exudate.  Eyes:     Extraocular Movements: Extraocular movements intact.     Pupils: Pupils are equal, round, and reactive to light.  Cardiovascular:     Rate and Rhythm: Normal rate and regular rhythm.     Heart sounds: Normal heart sounds. No murmur heard.    No gallop.  Pulmonary:     Effort: Pulmonary effort is normal. No respiratory distress.     Breath sounds:  Normal breath sounds. No wheezing or rales.  Chest:     Chest wall: No mass.  Breasts:    Breasts are symmetrical.     Right: Normal.     Left: Normal.  Abdominal:     General: Bowel sounds are normal. There is no distension.     Palpations: Abdomen is soft.     Tenderness: There is no abdominal tenderness. There is no guarding.  Genitourinary:    Vagina: Normal.     Cervix: Normal.     Uterus: Normal.      Adnexa: Right adnexa normal and left adnexa normal.     Rectum: Normal. Guaiac result positive.     Comments: hemoccult positive Pap done Musculoskeletal:        General: Tenderness present. Normal range of motion.     Cervical back: Normal range of motion and neck supple.  Skin:    General: Skin is warm and dry.  Neurological:     General: No focal deficit present.     Mental Status: She is alert and oriented to person, place, and time.  Psychiatric:        Mood and Affect: Mood normal.        Behavior: Behavior normal.        Thought Content: Thought content normal.        Judgment: Judgment normal.     BP (!) 140/80 (BP Location: Left Arm, Patient Position: Sitting, Cuff Size: Normal)   Pulse 80   Temp 98 F (36.7 C) (Oral)   Resp 16   Ht 5\' 7"  (1.702 m)   Wt  286 lb 3.2 oz (129.8 kg)   SpO2 96%   BMI 44.83 kg/m  Wt Readings from Last 3 Encounters:  04/03/22 286 lb 3.2 oz (129.8 kg)  03/16/22 296 lb 3.2 oz (134.4 kg)  03/03/22 298 lb 12.8 oz (135.5 kg)    Diabetic Foot Exam - Simple   No data filed    Lab Results  Component Value Date   WBC 3.9 (L) 09/27/2021   HGB 14.7 09/27/2021   HCT 41.9 09/27/2021   PLT 235.0 09/27/2021   GLUCOSE 93 09/27/2021   CHOL 164 09/27/2021   TRIG 52.0 09/27/2021   HDL 62.10 09/27/2021   LDLCALC 92 09/27/2021   ALT 17 09/27/2021   AST 15 09/27/2021   NA 136 09/27/2021   K 4.4 09/27/2021   CL 101 09/27/2021   CREATININE 0.59 09/27/2021   BUN 13 09/27/2021   CO2 28 09/27/2021   TSH 1.67 12/21/2020   HGBA1C 5.4  06/06/2013    Lab Results  Component Value Date   TSH 1.67 12/21/2020   Lab Results  Component Value Date   WBC 3.9 (L) 09/27/2021   HGB 14.7 09/27/2021   HCT 41.9 09/27/2021   MCV 85.1 09/27/2021   PLT 235.0 09/27/2021   Lab Results  Component Value Date   NA 136 09/27/2021   K 4.4 09/27/2021   CO2 28 09/27/2021   GLUCOSE 93 09/27/2021   BUN 13 09/27/2021   CREATININE 0.59 09/27/2021   BILITOT 1.0 09/27/2021   ALKPHOS 55 09/27/2021   AST 15 09/27/2021   ALT 17 09/27/2021   PROT 7.2 09/27/2021   ALBUMIN 4.4 09/27/2021   CALCIUM 9.7 09/27/2021   GFR 105.63 09/27/2021   Lab Results  Component Value Date   CHOL 164 09/27/2021   Lab Results  Component Value Date   HDL 62.10 09/27/2021   Lab Results  Component Value Date   LDLCALC 92 09/27/2021   Lab Results  Component Value Date   TRIG 52.0 09/27/2021   Lab Results  Component Value Date   CHOLHDL 3 09/27/2021   Lab Results  Component Value Date   HGBA1C 5.4 06/06/2013       Assessment & Plan:   Problem List Items Addressed This Visit       Unprioritized   Fibromyalgia   Relevant Medications   HYDROcodone-acetaminophen (NORCO) 10-325 MG tablet   citalopram (CELEXA) 40 MG tablet   buPROPion (WELLBUTRIN XL) 300 MG 24 hr tablet   Rectal bleeding    Refer to gi      Primary hypertension    Well controlled, no changes to meds. Encouraged heart healthy diet such as the DASH diet and exercise as tolerated.       Preventative health care - Primary    ghm utd Check labs  See avs      Relevant Medications   HYDROcodone-acetaminophen (NORCO) 10-325 MG tablet   citalopram (CELEXA) 40 MG tablet   buPROPion (WELLBUTRIN XL) 300 MG 24 hr tablet   Other Relevant Orders   Cytology - PAP( [facility])   CBC with Differential/Platelet   Comprehensive metabolic panel   Lipid panel   TSH   POC Hemoccult Bld/Stl (1-Cd Office Dx)   Morbid obesity with BMI of 40.0-44.9, adult (HCC)    Discussed diet  and exercise      Other Visit Diagnoses     Encounter for initial prescription of contraceptive pills       Relevant Medications   norethindrone (  INCASSIA) 0.35 MG tablet   Need for hepatitis C screening test       Relevant Orders   Hepatitis C antibody   High risk medication use       Relevant Orders   Drug Monitoring Panel [phone] , Urine   Rectal bleed       Relevant Orders   Ambulatory referral to Gastroenterology       Meds ordered this encounter  Medications   norethindrone (INCASSIA) 0.35 MG tablet    Sig: Take 1 tablet (0.35 mg total) by mouth daily.    Dispense:  28 tablet    Refill:  11   HYDROcodone-acetaminophen (NORCO) 10-325 MG tablet    Sig: Take 1 tablet by mouth every 6 (six) hours as needed.    Dispense:  30 tablet    Refill:  0    I, [name], DO, personally preformed the services described in this documentation.  All medical record entries made by the scribe were at my direction and in my presence.  I have reviewed the chart and discharge instructions (if applicable) and agree that the record reflects my personal performance and is accurate and complete. 04/03/2022   I,Amber Collins,acting as a scribe for [employer], DO.,have documented all relevant documentation on the behalf of [name], DO,as directed by  [name], DO while in the presence of [name], DO.    [name], DO

## 2022-04-04 DIAGNOSIS — Z0289 Encounter for other administrative examinations: Secondary | ICD-10-CM

## 2022-04-04 LAB — HEPATITIS C ANTIBODY: Hepatitis C Ab: NONREACTIVE

## 2022-04-04 NOTE — Telephone Encounter (Signed)
Dr. Judeth Horn has completed the [facility] paperwork.  I called patient and she will come to [facility] office and pick it up and will pay $29 fee at that time.

## 2022-04-06 LAB — DRUG MONITORING PANEL 376104, URINE
Amphetamine: 4574 ng/mL — ABNORMAL HIGH (ref <250)
Amphetamines: POSITIVE ng/mL — AB (ref <500)
Barbiturates: NEGATIVE ng/mL (ref <300)
Benzodiazepines: NEGATIVE ng/mL (ref <100)
Cocaine Metabolite: NEGATIVE ng/mL (ref <150)
Desmethyltramadol: NEGATIVE ng/mL (ref <100)
Methamphetamine: NEGATIVE ng/mL (ref <250)
Opiates: NEGATIVE ng/mL (ref <100)
Oxycodone: NEGATIVE ng/mL (ref <100)
Tramadol: NEGATIVE ng/mL (ref <100)

## 2022-04-06 LAB — DM TEMPLATE

## 2022-04-06 LAB — CYTOLOGY - PAP
Adequacy: ABSENT
Diagnosis: NEGATIVE

## 2022-04-07 ENCOUNTER — Telehealth: Payer: Self-pay | Admitting: Pulmonary Disease

## 2022-04-07 ENCOUNTER — Ambulatory Visit (INDEPENDENT_AMBULATORY_CARE_PROVIDER_SITE_OTHER): Payer: BC Managed Care – PPO | Admitting: Neurology

## 2022-04-07 DIAGNOSIS — G43709 Chronic migraine without aura, not intractable, without status migrainosus: Secondary | ICD-10-CM

## 2022-04-07 MED ORDER — ONABOTULINUMTOXINA 100 UNITS IJ SOLR
200.0000 [IU] | Freq: Once | INTRAMUSCULAR | Status: AC
  Administered 2022-04-07: 155 [IU] via INTRAMUSCULAR

## 2022-04-07 NOTE — Progress Notes (Signed)
Botulinum Clinic  ° °Procedure Note Botox ° °Attending: Dr. Besse Miron ° °Preoperative Diagnosis(es): Chronic migraine ° °Consent obtained from: The patient °Benefits discussed included, but were not limited to decreased muscle tightness, increased joint range of motion, and decreased pain.  Risk discussed included, but were not limited pain and discomfort, bleeding, bruising, excessive weakness, venous thrombosis, muscle atrophy and dysphagia.  Anticipated outcomes of the procedure as well as he risks and benefits of the alternatives to the procedure, and the roles and tasks of the personnel to be involved, were discussed with the patient, and the patient consents to the procedure and agrees to proceed. A copy of the patient medication guide was given to the patient which explains the blackbox warning. ° °Patients identity and treatment sites confirmed Yes.  . ° °Details of Procedure: °Skin was cleaned with alcohol. Prior to injection, the needle plunger was aspirated to make sure the needle was not within a blood vessel.  There was no blood retrieved on aspiration.   ° °Following is a summary of the muscles injected  And the amount of Botulinum toxin used: ° °Dilution °200 units of Botox was reconstituted with 4 ml of preservative free normal saline. °Time of reconstitution: At the time of the office visit (<30 minutes prior to injection)  ° °Injections  °155 total units of Botox was injected with a 30 gauge needle. ° °Injection Sites: °L occipitalis: 15 units- 3 sites  °R occiptalis: 15 units- 3 sites ° °L upper trapezius: 15 units- 3 sites °R upper trapezius: 15 units- 3 sits          °L paraspinal: 10 units- 2 sites °R paraspinal: 10 units- 2 sites ° °Face °L frontalis(2 injection sites):10 units   °R frontalis(2 injection sites):10 units         °L corrugator: 5 units   °R corrugator: 5 units           °Procerus: 5 units   °L temporalis: 20 units °R temporalis: 20 units  ° °Agent:  °200 units of botulinum Type  A (Onobotulinum Toxin type A) was reconstituted with 4 ml of preservative free normal saline.  °Time of reconstitution: At the time of the office visit (<30 minutes prior to injection)  ° ° ° Total injected (Units):  155 ° Total wasted (Units):  45 ° °Patient tolerated procedure well without complications.   °Reinjection is anticipated in 3 months. ° ° °

## 2022-04-13 NOTE — Telephone Encounter (Signed)
Patient brought FMLA form back to the [place] office - the duration of illness and medical facts regarding her condition (Page 2) need to be completed.  She asked that duration be life long with a brief diagnosis.  And the medical facts to explain her condition.   I've given the form back to Dr. Silas Flood for update.   When completed, it will need to be faxed to [facility] at [phone], Claim# [id number]

## 2022-04-19 NOTE — Telephone Encounter (Signed)
Dr. Silas Flood added  "lifelong" as duration of condition and included medical facts and reasons she needs to be absent from her job when having a flare-up.  Updated form was faxed to [facility].  I called patient and let her know hard copy is at front desk for her to pick up.  Signed form was also faxed to [place] fax# [phone]

## 2022-05-05 ENCOUNTER — Telehealth: Payer: Self-pay | Admitting: Pulmonary Disease

## 2022-05-05 NOTE — Telephone Encounter (Signed)
Jo Jackson just called - she needs a revision to her FMLA paperwork to extend the time off for flare ups to 4-6 times/month lasting up to 1 day each time (Question 7 on the form). This is because of the mold exposure she's had lately that is exacerbated by moisture/rain. She has had 4 occurrences in the last 30 days. I'm providing the form down for Jo Jackson to update to be 4-6 instances/month lasting 1 full day each. I've also asked Jo Jackson [facility] nurse) to  prepare a letter that explains the revisions to the frequency of her flare-ups.  I'll fax the revised form and letter to [employer].

## 2022-05-05 NOTE — Telephone Encounter (Signed)
Jo Jackson is working on [employer] form with Dr. Silas Flood and April.  Updates are being made to the [facility] forms.  She will need an OV as her condition/symptoms seem to be changing.

## 2022-05-10 ENCOUNTER — Telehealth: Payer: Self-pay | Admitting: Pulmonary Disease

## 2022-05-10 DIAGNOSIS — J4540 Moderate persistent asthma, uncomplicated: Secondary | ICD-10-CM

## 2022-05-10 MED ORDER — TRELEGY ELLIPTA 200-62.5-25 MCG/ACT IN AEPB: 1.0000 | INHALATION_SPRAY | Freq: Every day | RESPIRATORY_TRACT | 3 refills | Status: DC

## 2022-05-10 NOTE — Telephone Encounter (Signed)
Called and went over medication and where to send 3 month supply. Nothing further needed

## 2022-05-10 NOTE — Telephone Encounter (Signed)
Dr. Silas Flood has requested that patient have an office visit before he will make any changes to the FMLA paperwork he signed on 03/31/22 or send a letter to her employer changing the frequency of her flare-ups. Marland Kitchen  April Smith will contact the patient to make this appointment.

## 2022-05-16 ENCOUNTER — Encounter: Payer: Self-pay | Admitting: Family Medicine

## 2022-05-19 NOTE — Telephone Encounter (Signed)
Dr. Silas Flood went ahead and updated the flare-up frequency on the patient's FMLA form to 4 episodes/month lasting 12 hours or 1 day each.  He made handwritten changes to the original form and initialed and dated the changes.  I have faxed the updated form and a copy of the letter explaining the frequency to [employer] at fax# [phone].  Mailed a hard copy to the patient.

## 2022-05-29 ENCOUNTER — Other Ambulatory Visit: Payer: Self-pay | Admitting: Family Medicine

## 2022-05-29 DIAGNOSIS — G43809 Other migraine, not intractable, without status migrainosus: Secondary | ICD-10-CM

## 2022-05-29 DIAGNOSIS — Z0000 Encounter for general adult medical examination without abnormal findings: Secondary | ICD-10-CM

## 2022-05-29 DIAGNOSIS — M797 Fibromyalgia: Secondary | ICD-10-CM

## 2022-05-29 DIAGNOSIS — R42 Dizziness and giddiness: Secondary | ICD-10-CM

## 2022-05-29 MED ORDER — HYDROCODONE-ACETAMINOPHEN 10-325 MG PO TABS: 1.0000 | ORAL_TABLET | Freq: Four times a day (QID) | ORAL | 0 refills | Status: DC | PRN

## 2022-05-29 MED ORDER — MECLIZINE HCL 25 MG PO TABS: ORAL_TABLET | ORAL | 0 refills | Status: AC

## 2022-05-29 MED ORDER — TRAMADOL HCL 50 MG PO TABS: 50.0000 mg | ORAL_TABLET | Freq: Three times a day (TID) | ORAL | 0 refills | Status: DC | PRN

## 2022-05-29 NOTE — Telephone Encounter (Signed)
Requesting: tramadol 50mg  and hydrocodone 10-325mg   Contract: 04/03/22 UDS: 04/03/22 Last Visit: 04/03/22 Next Visit: 10/02/22 Last Refill on tramadol: 12/02/21 #30 and 0RF Last Refill on hydrocodone: 04/03/22 #30 and 0RF   Please Advise

## 2022-05-30 ENCOUNTER — Encounter: Payer: Self-pay | Admitting: Family Medicine

## 2022-05-31 MED ORDER — MELOXICAM 7.5 MG PO TABS: ORAL_TABLET | ORAL | 2 refills | Status: DC

## 2022-06-02 NOTE — Telephone Encounter (Signed)
See encounter from 10/13. Will close this encounter.

## 2022-06-06 ENCOUNTER — Telehealth: Payer: Self-pay

## 2022-06-06 NOTE — Telephone Encounter (Signed)
PA initiated via Covermymeds; KEY: [id number]. Awaiting determination.

## 2022-06-06 NOTE — Telephone Encounter (Signed)
PA approved. Effective 06/06/2022 to 12/03/2022.

## 2022-06-06 NOTE — Telephone Encounter (Signed)
PA initiated via Covermymeds; KEY: [id number]. Awaiting determination.

## 2022-06-06 NOTE — Telephone Encounter (Signed)
PA approved. Effective 06/06/22 to 12/03/22.

## 2022-06-14 ENCOUNTER — Other Ambulatory Visit (HOSPITAL_COMMUNITY): Payer: Self-pay

## 2022-06-24 ENCOUNTER — Encounter: Payer: Self-pay | Admitting: Family Medicine

## 2022-06-26 NOTE — Telephone Encounter (Signed)
Chart has been updated for the Tdap and Hep B

## 2022-06-27 ENCOUNTER — Encounter: Payer: Self-pay | Admitting: Family Medicine

## 2022-06-27 ENCOUNTER — Other Ambulatory Visit: Payer: Self-pay | Admitting: Family Medicine

## 2022-06-27 DIAGNOSIS — M797 Fibromyalgia: Secondary | ICD-10-CM

## 2022-06-27 DIAGNOSIS — G43809 Other migraine, not intractable, without status migrainosus: Secondary | ICD-10-CM

## 2022-06-27 DIAGNOSIS — Z0000 Encounter for general adult medical examination without abnormal findings: Secondary | ICD-10-CM

## 2022-06-28 NOTE — Telephone Encounter (Signed)
Requesting: hydrocodone 10-325mg  and tramadol 50mg  Contract:04/03/22 UDS: 04/03/22 Last Visit: 04/03/22 Next Visit: 10/02/22 Last Refill on hydrocodone 05/29/22 #30 and 0RF Last Refill on tramadol; 05/29/22 #30 and 0RF   Please Advise

## 2022-06-29 MED ORDER — HYDROCODONE-ACETAMINOPHEN 10-325 MG PO TABS: 1.0000 | ORAL_TABLET | Freq: Four times a day (QID) | ORAL | 0 refills | Status: DC | PRN

## 2022-06-29 MED ORDER — TRAMADOL HCL 50 MG PO TABS: 50.0000 mg | ORAL_TABLET | Freq: Three times a day (TID) | ORAL | 0 refills | Status: DC | PRN

## 2022-06-30 ENCOUNTER — Other Ambulatory Visit (HOSPITAL_COMMUNITY): Payer: Self-pay

## 2022-07-03 ENCOUNTER — Other Ambulatory Visit (HOSPITAL_COMMUNITY): Payer: Self-pay

## 2022-07-07 ENCOUNTER — Ambulatory Visit (INDEPENDENT_AMBULATORY_CARE_PROVIDER_SITE_OTHER): Payer: BC Managed Care – PPO | Admitting: Neurology

## 2022-07-07 DIAGNOSIS — G43709 Chronic migraine without aura, not intractable, without status migrainosus: Secondary | ICD-10-CM

## 2022-07-07 NOTE — Progress Notes (Signed)
Botulinum Clinic  ° °Procedure Note Botox ° °Attending: Dr. Hawley Pavia ° °Preoperative Diagnosis(es): Chronic migraine ° °Consent obtained from: The patient °Benefits discussed included, but were not limited to decreased muscle tightness, increased joint range of motion, and decreased pain.  Risk discussed included, but were not limited pain and discomfort, bleeding, bruising, excessive weakness, venous thrombosis, muscle atrophy and dysphagia.  Anticipated outcomes of the procedure as well as he risks and benefits of the alternatives to the procedure, and the roles and tasks of the personnel to be involved, were discussed with the patient, and the patient consents to the procedure and agrees to proceed. A copy of the patient medication guide was given to the patient which explains the blackbox warning. ° °Patients identity and treatment sites confirmed Yes.  . ° °Details of Procedure: °Skin was cleaned with alcohol. Prior to injection, the needle plunger was aspirated to make sure the needle was not within a blood vessel.  There was no blood retrieved on aspiration.   ° °Following is a summary of the muscles injected  And the amount of Botulinum toxin used: ° °Dilution °200 units of Botox was reconstituted with 4 ml of preservative free normal saline. °Time of reconstitution: At the time of the office visit (<30 minutes prior to injection)  ° °Injections  °155 total units of Botox was injected with a 30 gauge needle. ° °Injection Sites: °L occipitalis: 15 units- 3 sites  °R occiptalis: 15 units- 3 sites ° °L upper trapezius: 15 units- 3 sites °R upper trapezius: 15 units- 3 sits          °L paraspinal: 10 units- 2 sites °R paraspinal: 10 units- 2 sites ° °Face °L frontalis(2 injection sites):10 units   °R frontalis(2 injection sites):10 units         °L corrugator: 5 units   °R corrugator: 5 units           °Procerus: 5 units   °L temporalis: 20 units °R temporalis: 20 units  ° °Agent:  °200 units of botulinum Type  A (Onobotulinum Toxin type A) was reconstituted with 4 ml of preservative free normal saline.  °Time of reconstitution: At the time of the office visit (<30 minutes prior to injection)  ° ° ° Total injected (Units):  155 ° Total wasted (Units):  45 ° °Patient tolerated procedure well without complications.   °Reinjection is anticipated in 3 months. ° ° °

## 2022-07-09 ENCOUNTER — Encounter: Payer: Self-pay | Admitting: Family Medicine

## 2022-07-10 ENCOUNTER — Other Ambulatory Visit: Payer: Self-pay | Admitting: Family Medicine

## 2022-07-10 DIAGNOSIS — B001 Herpesviral vesicular dermatitis: Secondary | ICD-10-CM

## 2022-07-10 MED ORDER — VALACYCLOVIR HCL 1 G PO TABS: 1000.0000 mg | ORAL_TABLET | Freq: Three times a day (TID) | ORAL | 2 refills | Status: DC

## 2022-07-10 NOTE — Telephone Encounter (Signed)
Medication not on med list

## 2022-07-20 ENCOUNTER — Other Ambulatory Visit: Payer: Self-pay | Admitting: Pulmonary Disease

## 2022-07-20 ENCOUNTER — Other Ambulatory Visit: Payer: Self-pay | Admitting: Family Medicine

## 2022-07-20 DIAGNOSIS — R053 Chronic cough: Secondary | ICD-10-CM

## 2022-07-20 DIAGNOSIS — J111 Influenza due to unidentified influenza virus with other respiratory manifestations: Secondary | ICD-10-CM

## 2022-07-28 ENCOUNTER — Other Ambulatory Visit: Payer: Self-pay | Admitting: Family Medicine

## 2022-07-28 ENCOUNTER — Other Ambulatory Visit: Payer: Self-pay | Admitting: *Deleted

## 2022-07-28 ENCOUNTER — Encounter: Payer: Self-pay | Admitting: Family Medicine

## 2022-07-28 ENCOUNTER — Other Ambulatory Visit: Payer: Self-pay | Admitting: Neurology

## 2022-07-28 DIAGNOSIS — Z0000 Encounter for general adult medical examination without abnormal findings: Secondary | ICD-10-CM

## 2022-07-28 DIAGNOSIS — G43809 Other migraine, not intractable, without status migrainosus: Secondary | ICD-10-CM

## 2022-07-28 DIAGNOSIS — M797 Fibromyalgia: Secondary | ICD-10-CM

## 2022-07-28 MED ORDER — MELOXICAM 7.5 MG PO TABS: ORAL_TABLET | ORAL | 0 refills | Status: DC

## 2022-07-31 MED ORDER — HYDROCODONE-ACETAMINOPHEN 10-325 MG PO TABS: 1.0000 | ORAL_TABLET | Freq: Four times a day (QID) | ORAL | 0 refills | Status: DC | PRN

## 2022-07-31 MED ORDER — TRAMADOL HCL 50 MG PO TABS: 50.0000 mg | ORAL_TABLET | Freq: Three times a day (TID) | ORAL | 0 refills | Status: DC | PRN

## 2022-07-31 NOTE — Telephone Encounter (Signed)
Requesting: tramadol 50mg , hydrocodone 10/325mg  Contract: 04/03/22 UDS: 04/03/22 Last Visit: 04/03/22 Next Visit: 08/01/21 Last Refill: 06/29/22  Please Advise

## 2022-08-01 ENCOUNTER — Ambulatory Visit (INDEPENDENT_AMBULATORY_CARE_PROVIDER_SITE_OTHER): Payer: BC Managed Care – PPO | Admitting: Family Medicine

## 2022-08-01 ENCOUNTER — Encounter: Payer: Self-pay | Admitting: Family Medicine

## 2022-08-01 VITALS — BP 140/90 | HR 82 | Temp 99.1°F | Resp 18 | Ht 67.0 in | Wt 287.0 lb

## 2022-08-01 DIAGNOSIS — J0140 Acute pansinusitis, unspecified: Secondary | ICD-10-CM

## 2022-08-01 DIAGNOSIS — J324 Chronic pansinusitis: Secondary | ICD-10-CM

## 2022-08-01 MED ORDER — LEVOFLOXACIN 500 MG PO TABS: 500.0000 mg | ORAL_TABLET | Freq: Every day | ORAL | 0 refills | Status: AC

## 2022-08-01 MED ORDER — FLUCONAZOLE 150 MG PO TABS: 150.0000 mg | ORAL_TABLET | Freq: Every day | ORAL | 0 refills | Status: DC

## 2022-08-01 NOTE — Assessment & Plan Note (Signed)
Levaquin  Con't flonase/ astelin and zyrtec Ct sinuses  Refer to ent due to this going on for 1 month

## 2022-08-01 NOTE — Progress Notes (Addendum)
Subjective:   By signing my name below, I, Marlana Latus, attest that this documentation has been prepared under the direction and in the presence of [place], [place] DO 08/01/2022   Patient ID: Jo Jackson, female    DOB: [birth year], 52 y.o.   MRN: [id number]  Chief Complaint  Patient presents with   Sinus Problem    HPI Patient is in today for a sick visit for sinus infection. She is accompanied by her 2 daughters.  She has been experiencing sinus congestion for about 2-3 weeks. When blowing her nose she has bloody discharge. She occasionally will get a clear mucus, but it is usually bloody. She has not been coughing much but when she does, it is productive. She denies any fever and reports that her temperature as of today was 99.1.   She has finished her doses of Augmentin and Doxyxycline. She has been taking antibiotics, flonase, zyrtec, and azelastine to relieve symptoms with no resolve. She finished prednisone yesterday.   She denies having any fever, new muscle pain, new joint pain, new moles, sinus pain, sore throat, chest pain, palpitations, SOB, wheezing, n/v/d, constipation, blood in stool, dysuria, frequency, hematuria, or headaches at this time.   Past Medical History:  Diagnosis Date   Abnormal Pap smear    Repeats WNL   ADD (attention deficit disorder)    Anxiety    Arthritis    Oseteoarthritis   Depression    GERD (gastroesophageal reflux disease)    Headache(784.0)    Migraines   Junctional escape rhythm    Neuromuscular disorder (HCC)    Fibromyalgia   Pregnancy induced hypertension    Supraventricular tachycardia     Past Surgical History:  Procedure Laterality Date   ABLATION OF DYSRHYTHMIC FOCUS     APPENDECTOMY  1986   CARPAL TUNNEL RELEASE  2000   CERVICAL FUSION  08-24-99,04-26-00,05-09-01   Knee sugery Left    16 yrs. old   SHOULDER SURGERY  left-6-04, right 09-12-04   tongue polyp  1984    Family History  Problem Relation Age  of Onset   Migraines Mother    COPD Mother    Heart disease Mother        chf   Depression Mother    Hodgkin's lymphoma Father    ADD / ADHD Brother    Diabetes Other    Thyroid disease Other    Hypertension Other    Depression Other    Bipolar disorder Other    Cancer Cousin     Social History   Socioeconomic History   Marital status: Widowed    Spouse name: Not on file   Number of children: 2   Years of education: Not on file   Highest education level: Not on file  Occupational History    Employer: [employer]    Comment: P/T   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    Employer: [employer]  Tobacco Use   Smoking status: Former    Types: Cigarettes    Quit date: 10/23/2007    Years since quitting: 14.7   Smokeless tobacco: Never  Vaping Use   Vaping Use: Never used  Substance and Sexual Activity   Alcohol use: Yes    Alcohol/week: 0.0 standard drinks of alcohol    Comment: Occ   Drug use: No   Sexual activity: Not Currently    Partners: Male  Other Topics Concern   Not on file  Social History Narrative   G2p2  Exercise- no      Right handed   Social Determinants of Health   Financial Resource Strain: Not on file  Food Insecurity: Not on file  Transportation Needs: Not on file  Physical Activity: Not on file  Stress: Not on file  Social Connections: Not on file  Intimate Partner Violence: Not on file    Outpatient Medications Prior to Visit  Medication Sig Dispense Refill   albuterol (PROVENTIL) (2.5 MG/3ML) 0.083% nebulizer solution Take 3 mLs (2.5 mg total) by nebulization every 6 (six) hours as needed for wheezing or shortness of breath. 75 mL 12   albuterol (VENTOLIN HFA) 108 (90 Base) MCG/ACT inhaler Inhale 2 puffs into the lungs every 6 (six) hours as needed for wheezing or shortness of breath. 18 g 5   amphetamine-dextroamphetamine (ADDERALL) 30 MG tablet Take 1 tablet by mouth 2 (two) times daily. 180 tablet 0   Azelastine HCl 137 MCG/SPRAY SOLN  Place into both nostrils.     benzonatate (TESSALON) 200 MG capsule Take 1 capsule (200 mg total) by mouth 3 (three) times daily as needed for cough. 90 capsule 2   botulinum toxin Type A (BOTOX) 200 units injection Inject 200 Units into the muscle every 3 (three) months. Inject 155 units IM into multiple site in the face,neck and head once every 90 days 1 each 4   buPROPion (WELLBUTRIN XL) 300 MG 24 hr tablet Take 1 tablet (300 mg total) by mouth daily. 30 tablet 2   citalopram (CELEXA) 40 MG tablet Take 1 tablet (40 mg total) by mouth daily. 30 tablet 3   eletriptan (RELPAX) 40 MG tablet TAKE 1 TABLET BY MOUTH AS NEEDED FOR MIGRAINE OR HEADACHE. MAY REPEAT IN 2 HOURS IF HEADACHE PERSISTS OR RECURS. MAXIMUM 2 TABLETS IN 24 HOURS. 10 tablet 1   Fluticasone-Umeclidin-Vilant (TRELEGY ELLIPTA) 200-62.5-25 MCG/ACT AEPB Inhale 1 puff into the lungs daily. 60 each 3   HYDROcodone-acetaminophen (NORCO) 10-325 MG tablet Take 1 tablet by mouth every 6 (six) hours as needed for severe pain. 30 tablet 0   meclizine (ANTIVERT) 25 MG tablet TAKE 1 TABLET BY MOUTH 3 TIMES DAILY AS NEEDED FOR DIZZINESS. 30 tablet 0   meloxicam (MOBIC) 7.5 MG tablet Take 1-2 tablets daily as needed for pain. 180 tablet 0   mometasone (NASONEX) 50 MCG/ACT nasal spray PLACE 2 SPRAYS INTO THE NOSE DAILY. 51 each 4   norethindrone (INCASSIA) 0.35 MG tablet Take 1 tablet (0.35 mg total) by mouth daily. 28 tablet 11   ondansetron (ZOFRAN-ODT) 8 MG disintegrating tablet DISSOLVE 1 TABLET ON TONGUE EVERY 8 HOURS AS NEEDED FOR NAUSEA AND VOMITING 20 tablet 1   tiZANidine (ZANAFLEX) 4 MG tablet Take 1 tablet (4 mg total) by mouth every 6 (six) hours as needed for muscle spasms. 90 tablet 1   traMADol (ULTRAM) 50 MG tablet Take 1 tablet (50 mg total) by mouth every 8 (eight) hours as needed (mod pain). 30 tablet 0   valACYclovir (VALTREX) 1000 MG tablet Take 1 tablet (1,000 mg total) by mouth 3 (three) times daily. 30 tablet 2   Erenumab-aooe  (AIMOVIG) 140 MG/ML SOAJ Inject 140 mg into the skin every 28 (twenty-eight) days. 1 mL 1   No facility-administered medications prior to visit.    Allergies  Allergen Reactions   Ketorolac Tromethamine Hives   Morphine Other (See Comments)    "makes me the devil."   Latex Rash and Other (See Comments)    Latex catheter caused extreme irritation  Review of Systems  Constitutional:  Negative for fever and malaise/fatigue.  HENT:  Positive for congestion.   Eyes:  Negative for blurred vision.  Respiratory:  Positive for cough (on occasion, but productive and clear mucus). Negative for shortness of breath.   Cardiovascular:  Negative for chest pain, palpitations and leg swelling.  Gastrointestinal:  Negative for abdominal pain, blood in stool and nausea.  Genitourinary:  Negative for dysuria and frequency.  Musculoskeletal:  Negative for falls.  Skin:  Negative for rash.  Neurological:  Negative for dizziness, loss of consciousness and headaches.  Endo/Heme/Allergies:  Negative for environmental allergies.  Psychiatric/Behavioral:  Negative for depression. The patient is not nervous/anxious.        Objective:    Physical Exam Vitals and nursing note reviewed.  Constitutional:      General: She is not in acute distress.    Appearance: Normal appearance.  HENT:     Head: Normocephalic and atraumatic.     Right Ear: External ear normal.     Left Ear: External ear normal.     Nose: Congestion present.     Right Sinus: Maxillary sinus tenderness and frontal sinus tenderness present.     Left Sinus: Maxillary sinus tenderness and frontal sinus tenderness present.     Mouth/Throat:     Pharynx: Oropharynx is clear.  Eyes:     Extraocular Movements: Extraocular movements intact.     Pupils: Pupils are equal, round, and reactive to light.  Cardiovascular:     Rate and Rhythm: Normal rate and regular rhythm.     Pulses: Normal pulses.     Heart sounds: Normal heart sounds. No  murmur heard.    No gallop.  Pulmonary:     Effort: Pulmonary effort is normal. No respiratory distress.     Breath sounds: Normal breath sounds. No wheezing.  Skin:    General: Skin is warm and dry.  Neurological:     Mental Status: She is alert and oriented to person, place, and time.  Psychiatric:        Judgment: Judgment normal.     BP (!) 140/90 (BP Location: Left Arm, Patient Position: Sitting, Cuff Size: Large)   Pulse 82   Temp 99.1 F (37.3 C) (Oral)   Resp 18   Ht 5\' 7"  (1.702 m)   Wt 287 lb (130.2 kg)   SpO2 98%   BMI 44.95 kg/m  Wt Readings from Last 3 Encounters:  08/01/22 287 lb (130.2 kg)  04/03/22 286 lb 3.2 oz (129.8 kg)  03/16/22 296 lb 3.2 oz (134.4 kg)    Diabetic Foot Exam - Simple   No data filed    Lab Results  Component Value Date   WBC 5.0 04/03/2022   HGB 14.0 04/03/2022   HCT 40.7 04/03/2022   PLT 218.0 04/03/2022   GLUCOSE 90 04/03/2022   CHOL 170 04/03/2022   TRIG 56.0 04/03/2022   HDL 63.20 04/03/2022   LDLCALC 96 04/03/2022   ALT 19 04/03/2022   AST 19 04/03/2022   NA 138 04/03/2022   K 3.9 04/03/2022   CL 99 04/03/2022   CREATININE 0.80 04/03/2022   BUN 9 04/03/2022   CO2 30 04/03/2022   TSH 2.03 04/03/2022   HGBA1C 5.4 06/06/2013    Lab Results  Component Value Date   TSH 2.03 04/03/2022   Lab Results  Component Value Date   WBC 5.0 04/03/2022   HGB 14.0 04/03/2022   HCT 40.7 04/03/2022  MCV 84.2 04/03/2022   PLT 218.0 04/03/2022   Lab Results  Component Value Date   NA 138 04/03/2022   K 3.9 04/03/2022   CO2 30 04/03/2022   GLUCOSE 90 04/03/2022   BUN 9 04/03/2022   CREATININE 0.80 04/03/2022   BILITOT 0.9 04/03/2022   ALKPHOS 62 04/03/2022   AST 19 04/03/2022   ALT 19 04/03/2022   PROT 6.9 04/03/2022   ALBUMIN 4.1 04/03/2022   CALCIUM 9.3 04/03/2022   GFR 86.04 04/03/2022   Lab Results  Component Value Date   CHOL 170 04/03/2022   Lab Results  Component Value Date   HDL 63.20 04/03/2022    Lab Results  Component Value Date   LDLCALC 96 04/03/2022   Lab Results  Component Value Date   TRIG 56.0 04/03/2022   Lab Results  Component Value Date   CHOLHDL 3 04/03/2022   Lab Results  Component Value Date   HGBA1C 5.4 06/06/2013       Assessment & Plan:   Problem List Items Addressed This Visit       Unprioritized   SINUSITIS - ACUTE-NOS   Relevant Medications   levofloxacin (LEVAQUIN) 500 MG tablet   fluconazole (DIFLUCAN) 150 MG tablet   Pansinusitis - Primary    Levaquin  Con't flonase/ astelin and zyrtec Ct sinuses  Refer to ent due to this going on for 1 month       Relevant Medications   levofloxacin (LEVAQUIN) 500 MG tablet   fluconazole (DIFLUCAN) 150 MG tablet   Other Relevant Orders   Ambulatory referral to ENT   CT MAXILLOFACIAL W CONTRAST     Meds ordered this encounter  Medications   levofloxacin (LEVAQUIN) 500 MG tablet    Sig: Take 1 tablet (500 mg total) by mouth daily for 7 days.    Dispense:  7 tablet    Refill:  0   fluconazole (DIFLUCAN) 150 MG tablet    Sig: Take 1 tablet (150 mg total) by mouth daily.    Dispense:  2 tablet    Refill:  0    I, [name], DO, personally preformed the services described in this documentation.  All medical record entries made by the scribe were at my direction and in my presence.  I have reviewed the chart and discharge instructions (if applicable) and agree that the record reflects my personal performance and is accurate and complete. 08/01/2022   I,Rachel Rivera,acting as a scribe for [name], DO.,have documented all relevant documentation on the behalf of [name], DO,as directed by  [name], DO while in the presence of [name], DO.   [name], DO

## 2022-09-13 NOTE — Progress Notes (Signed)
NEUROLOGY FOLLOW UP OFFICE NOTE  Blossom Famous [id number]  Assessment/Plan:   Migraine without aura, without status migrainosus, not intractable    Migraine prevention:  Botox Migraine rescue:  Eletriptan '40mg'$ ;  Zofran for nausea. Limit use of pain relievers to no more than 2 days out of week to prevent risk of rebound or medication-overuse headache. Keep headache diary Follow up for Botox Follow up with PCP regarding blood pressure       Subjective:  Jo Jackson is a 52 year old right-handed female with asthma, ADD, depression and migraine who for migraines  UPDATE: Started Botox.  Status post 2 rounds. Nurtec ineffective but eletriptan was helpful.  Intensity:  4/10 Duration:  less than an hour with eletriptan Frequency:  no more than 3 headaches a week Current abortive management:  eletriptan Current NSAIDS/analgesics:  hydrocodone-acetaminophen, tramadol Current triptans:  eletriptan '40mg'$  Current ergotamine:  none Current anti-emetic:  Zofran ODT '8mg'$  Current muscle relaxants:  tizanidine '4mg'$  PRN Current Antihypertensive medications:  none Current Antidepressant medications:  Trintellix Current Anticonvulsant medications:  none Current anti-CGRP:  none Current Vitamins/Herbal/Supplements:  none Current Antihistamines/Decongestants:  meclizine Other therapy:  none Hormone/birth control:  none Other medications:  Adderall, albuterol    Caffeine:  Sweet tea Alcohol:  occasionally Smoker:  no Diet:  Needs to hydrate more Exercise:  Not routine Depression/stress:  better Sleep hygiene:  varies Other pain:  fibromyalgia. Family history of headache:  mom  HISTORY:  Onset:  She has had menstrually-related migraines since age 59, but they have been related to the neck since repeated cervical spinal fusions at C6-C7 over 10 years ago, as demonstrated on CT report from 2003. Location:  She develops spasm in the right trapezius which triggers pain in  the back of head on the right and radiates to the right temple/eye Quality:  Squeezing, throbbing Intensity:  7/10 Aura:  no Prodrome:  no Associated symptoms:  Both eyes hurt with or without headache.  Photophobia, phonophobia.  Occasional nausea.  No visual disturbance. Duration:  1 hour with medication but can last 2-3 days approximately 1 to 2 times a month Frequency:  During summer months, 5 days a week.  Otherwise, 3 a month Triggers/exacerbating factors:  Previously, it was her period (until she started taking Depo shot), now neck spasms Relieving factors:  Rest, tizanidine Activity:  functions   History of cervical fusion.  CT cervical spine on 08/02/2001 demonstrated:  1.  ANTERIOR AND POSTERIOR FUSION AT C6-7 WITH FRACTURE OF THE SCREWS AT C6.  NO CHANGE IN  ALIGNMENT WITH APPARENT FUSION AT THE LEVEL OF THE INTERBODY FUSION PLUG.  2.  QUESTIONABLE BULGING ANNULUS AND/OR HERNIATED DISC CENTRALLY  AND TO THE LEFT AT THE C7-T1 LEVEL.  IF THE PRIOR STUDIES CANNOT BE LOCATED FOR COMPARISON,  THEN CT MYELOGRAPHY MAY BE  INDICATED TO ASSESS THIS AREA FURTHER. CT MULTIPLANAR RECONSTRUCTION.  THE AXIAL SCANS WERE RECONSTRUCTED IN BOTH SAGITTAL AND CORONAL PLAINS.  THERE DOES APPEAR TO BE  GOOD BONY FUSION AT THE LEVEL OF THE INTERBODY FUSION PLUG AT C6-7, BUT THE REMAINDER OF THE INTERVERTEBRAL SPACE DOES NOT APPEAR TO BE SOLIDLY FUSED.  NORMAL ALIGNMENT IS MAINTAINED IN THE  SAGITALLY RECONSTRUCTED IMAGES.    Past NSAIDS/analgesics:  meloxicam, Tylenol, ibuprofen '800mg'$  Past abortive triptans:  sumatriptan [place]den/NS, rizatriptan Past abortive ergotamine:  possibly Past muscle relaxants:  cyclobenzaprine, methocarbamol, Soma Past anti-emetic:  none Past antihypertensive medications:  propranolol, metoprolol Past antidepressant medications:  venlafaxine, duloxetine, Viibryd Past anticonvulsant  medications:  topiramate Past anti-CGRP:  Roselyn Meier Past vitamins/Herbal/Supplements:  none Past  antihistamines/decongestants:  Nasonex Other past therapies:  none    PAST MEDICAL HISTORY: Past Medical History:  Diagnosis Date   Abnormal Pap smear    Repeats WNL   ADD (attention deficit disorder)    Anxiety    Arthritis    Oseteoarthritis   Depression    GERD (gastroesophageal reflux disease)    Headache(784.0)    Migraines   Junctional escape rhythm    Neuromuscular disorder (HCC)    Fibromyalgia   Pregnancy induced hypertension    Supraventricular tachycardia     MEDICATIONS: Current Outpatient Medications on File Prior to Visit  Medication Sig Dispense Refill   albuterol (PROVENTIL) (2.5 MG/3ML) 0.083% nebulizer solution Take 3 mLs (2.5 mg total) by nebulization every 6 (six) hours as needed for wheezing or shortness of breath. 75 mL 12   albuterol (VENTOLIN HFA) 108 (90 Base) MCG/ACT inhaler Inhale 2 puffs into the lungs every 6 (six) hours as needed for wheezing or shortness of breath. 18 g 5   amphetamine-dextroamphetamine (ADDERALL) 30 MG tablet Take 1 tablet by mouth 2 (two) times daily. 180 tablet 0   Azelastine HCl 137 MCG/SPRAY SOLN Place into both nostrils.     benzonatate (TESSALON) 200 MG capsule Take 1 capsule (200 mg total) by mouth 3 (three) times daily as needed for cough. 90 capsule 2   botulinum toxin Type A (BOTOX) 200 units injection Inject 200 Units into the muscle every 3 (three) months. Inject 155 units IM into multiple site in the face,neck and head once every 90 days 1 each 4   buPROPion (WELLBUTRIN XL) 300 MG 24 hr tablet Take 1 tablet (300 mg total) by mouth daily. 30 tablet 2   citalopram (CELEXA) 40 MG tablet Take 1 tablet (40 mg total) by mouth daily. 30 tablet 3   eletriptan (RELPAX) 40 MG tablet TAKE 1 TABLET BY MOUTH AS NEEDED FOR MIGRAINE OR HEADACHE. MAY REPEAT IN 2 HOURS IF HEADACHE PERSISTS OR RECURS. MAXIMUM 2 TABLETS IN 24 HOURS. 10 tablet 1   fluconazole (DIFLUCAN) 150 MG tablet Take 1 tablet (150 mg total) by mouth daily. 2 tablet 0    Fluticasone-Umeclidin-Vilant (TRELEGY ELLIPTA) 200-62.5-25 MCG/ACT AEPB Inhale 1 puff into the lungs daily. 60 each 3   HYDROcodone-acetaminophen (NORCO) 10-325 MG tablet Take 1 tablet by mouth every 6 (six) hours as needed for severe pain. 30 tablet 0   meclizine (ANTIVERT) 25 MG tablet TAKE 1 TABLET BY MOUTH 3 TIMES DAILY AS NEEDED FOR DIZZINESS. 30 tablet 0   meloxicam (MOBIC) 7.5 MG tablet Take 1-2 tablets daily as needed for pain. 180 tablet 0   mometasone (NASONEX) 50 MCG/ACT nasal spray PLACE 2 SPRAYS INTO THE NOSE DAILY. 51 each 4   norethindrone (INCASSIA) 0.35 MG tablet Take 1 tablet (0.35 mg total) by mouth daily. 28 tablet 11   ondansetron (ZOFRAN-ODT) 8 MG disintegrating tablet DISSOLVE 1 TABLET ON TONGUE EVERY 8 HOURS AS NEEDED FOR NAUSEA AND VOMITING 20 tablet 1   tiZANidine (ZANAFLEX) 4 MG tablet Take 1 tablet (4 mg total) by mouth every 6 (six) hours as needed for muscle spasms. 90 tablet 1   traMADol (ULTRAM) 50 MG tablet Take 1 tablet (50 mg total) by mouth every 8 (eight) hours as needed (mod pain). 30 tablet 0   valACYclovir (VALTREX) 1000 MG tablet Take 1 tablet (1,000 mg total) by mouth 3 (three) times daily. 30 tablet 2  No current facility-administered medications on file prior to visit.    ALLERGIES: Allergies  Allergen Reactions   Ketorolac Tromethamine Hives   Morphine Other (See Comments)    "makes me the devil."   Latex Rash and Other (See Comments)    Latex catheter caused extreme irritation    FAMILY HISTORY: Family History  Problem Relation Age of Onset   Migraines Mother    COPD Mother    Heart disease Mother        chf   Depression Mother    Hodgkin's lymphoma Father    ADD / ADHD Brother    Diabetes Other    Thyroid disease Other    Hypertension Other    Depression Other    Bipolar disorder Other    Cancer Cousin       Objective:  Blood pressure (!) 156/88, pulse 90, resp. rate 18, weight 297 lb (134.7 kg), SpO2 96 %. General: No  acute distress.  Patient appears well-groomed.      Metta Clines, DO  CC: Roma Schanz, DO

## 2022-09-14 ENCOUNTER — Other Ambulatory Visit (HOSPITAL_COMMUNITY): Payer: Self-pay

## 2022-09-15 ENCOUNTER — Encounter: Payer: Self-pay | Admitting: Neurology

## 2022-09-15 ENCOUNTER — Telehealth: Payer: Self-pay | Admitting: Pharmacy Technician

## 2022-09-15 ENCOUNTER — Ambulatory Visit (INDEPENDENT_AMBULATORY_CARE_PROVIDER_SITE_OTHER): Payer: BC Managed Care – PPO | Admitting: Neurology

## 2022-09-15 VITALS — BP 156/88 | HR 90 | Resp 18 | Wt 297.0 lb

## 2022-09-15 DIAGNOSIS — G43009 Migraine without aura, not intractable, without status migrainosus: Secondary | ICD-10-CM | POA: Diagnosis not present

## 2022-09-15 MED ORDER — ELETRIPTAN HYDROBROMIDE 40 MG PO TABS: ORAL_TABLET | ORAL | 5 refills | Status: DC

## 2022-09-15 NOTE — Telephone Encounter (Signed)
Patient Advocate Encounter  Received notification from [facility] that prior authorization for BOTOX is required.   PA submitted on 2.23.24 Key [id number] Status is pending

## 2022-09-18 ENCOUNTER — Other Ambulatory Visit: Payer: Self-pay

## 2022-09-18 ENCOUNTER — Other Ambulatory Visit (HOSPITAL_COMMUNITY): Payer: Self-pay

## 2022-09-18 MED ORDER — BOTOX 200 UNITS IJ SOLR
200.0000 [IU] | INTRAMUSCULAR | 4 refills | Status: DC
  Filled 2022-09-18: qty 1, 90d supply, fill #0
  Filled 2022-12-20: qty 1, 90d supply, fill #1
  Filled 2023-03-21: qty 1, 90d supply, fill #2

## 2022-09-19 ENCOUNTER — Other Ambulatory Visit (HOSPITAL_COMMUNITY): Payer: Self-pay

## 2022-09-26 ENCOUNTER — Telehealth: Payer: BC Managed Care – PPO

## 2022-09-26 ENCOUNTER — Encounter: Payer: Self-pay | Admitting: Family

## 2022-09-26 ENCOUNTER — Ambulatory Visit: Payer: BC Managed Care – PPO | Admitting: Family

## 2022-09-26 ENCOUNTER — Other Ambulatory Visit: Payer: Self-pay | Admitting: Pulmonary Disease

## 2022-09-26 VITALS — BP 136/78 | HR 85 | Resp 18 | Ht 67.0 in | Wt 285.0 lb

## 2022-09-26 DIAGNOSIS — J069 Acute upper respiratory infection, unspecified: Secondary | ICD-10-CM | POA: Diagnosis not present

## 2022-09-26 DIAGNOSIS — J4540 Moderate persistent asthma, uncomplicated: Secondary | ICD-10-CM

## 2022-09-26 DIAGNOSIS — R051 Acute cough: Secondary | ICD-10-CM

## 2022-09-26 LAB — POC COVID19 BINAXNOW: SARS Coronavirus 2 Ag: NEGATIVE

## 2022-09-26 LAB — POCT INFLUENZA A/B
Influenza A, POC: NEGATIVE
Influenza B, POC: NEGATIVE

## 2022-09-26 NOTE — Progress Notes (Signed)
Jo Jackson is a 52 y.o. female with the following history as recorded in EpicCare:  Patient Active Problem List   Diagnosis Date Noted   Preventative health care 04/03/2022   Skin tag 11/03/2021   Inflamed skin tag 11/03/2021   Morbid obesity ([place]) 09/27/2021   Migraine 12/21/2020   Muscle spasm 12/21/2020   Nausea 12/21/2020   Primary hypertension 12/03/2020   Dizzy 08/18/2020   Adult ADHD 06/04/2019   Social anxiety disorder 06/04/2019   Influenza 04/15/2018   Recurrent acute serous otitis media of right ear 03/22/2018   Vaginal irritation 03/22/2018   Pansinusitis 10/12/2017   Viral URI 05/28/2017   Skin tag of perianal region 11/20/2016   Rectal bleeding 08/20/2015   Bronchitis 06/04/2015   Sensation of fullness in right ear 04/07/2015   Cervicogenic headache 11/06/2014   Obesity (BMI 30-39.9) 06/24/2013   Morbid obesity with BMI of 40.0-44.9, adult ([place]) 06/07/2013   SVD (spontaneous vaginal delivery) 12/15/2012   HAIR LOSS 08/11/2010   FATIGUE 08/11/2010   SINUSITIS - ACUTE-NOS 06/29/2010   GERD 06/29/2010   PAIN IN THORACIC SPINE 05/12/2010   Migraine without aura 02/10/2010   ELEVATED BLOOD PRESSURE WITHOUT DIAGNOSIS OF HYPERTENSION 02/10/2010   AMENORRHEA 04/26/2009   GAD (generalized anxiety disorder) 03/26/2009   Major depressive disorder, recurrent episode, in full remission ([place]) 03/26/2009   ALLERGIC REACTION 08/26/2008   Fibromyalgia 03/05/2007   ARTHROSCOPY, LEFT KNEE, HX OF 03/05/2007   Headache 01/31/2007   HX, PERSONAL, TOBACCO USE 01/31/2007    Current Outpatient Medications  Medication Sig Dispense Refill   albuterol (PROVENTIL) (2.5 MG/3ML) 0.083% nebulizer solution Take 3 mLs (2.5 mg total) by nebulization every 6 (six) hours as needed for wheezing or shortness of breath. 75 mL 12   albuterol (VENTOLIN HFA) 108 (90 Base) MCG/ACT inhaler Inhale 2 puffs into the lungs every 6 (six) hours as needed for wheezing or shortness of breath. 18 g 5    amphetamine-dextroamphetamine (ADDERALL) 30 MG tablet Take 1 tablet by mouth 2 (two) times daily. 180 tablet 0   Azelastine HCl 137 MCG/SPRAY SOLN Place into both nostrils.     benzonatate (TESSALON) 200 MG capsule Take 1 capsule (200 mg total) by mouth 3 (three) times daily as needed for cough. 90 capsule 2   botulinum toxin Type A (BOTOX) 200 units injection Inject 200 Units into the muscle every 3 (three) months. Inject 155 units IM into multiple site in the face,neck and head once every 90 days 1 each 4   buPROPion (WELLBUTRIN XL) 300 MG 24 hr tablet Take 1 tablet (300 mg total) by mouth daily. 30 tablet 2   citalopram (CELEXA) 40 MG tablet Take 1 tablet (40 mg total) by mouth daily. 30 tablet 3   eletriptan (RELPAX) 40 MG tablet TAKE 1 TABLET BY MOUTH AS NEEDED FOR MIGRAINE OR HEADACHE. MAY REPEAT IN 2 HOURS IF HEADACHE PERSISTS OR RECURS. MAXIMUM 2 TABLETS IN 24 HOURS. 10 tablet 5   fluconazole (DIFLUCAN) 150 MG tablet Take 1 tablet (150 mg total) by mouth daily. 2 tablet 0   Fluticasone-Umeclidin-Vilant (TRELEGY ELLIPTA) 200-62.5-25 MCG/ACT AEPB Inhale 1 puff into the lungs daily. 60 each 3   HYDROcodone-acetaminophen (NORCO) 10-325 MG tablet Take 1 tablet by mouth every 6 (six) hours as needed for severe pain. 30 tablet 0   meclizine (ANTIVERT) 25 MG tablet TAKE 1 TABLET BY MOUTH 3 TIMES DAILY AS NEEDED FOR DIZZINESS. 30 tablet 0   meloxicam (MOBIC) 7.5 MG tablet Take 1-2  tablets daily as needed for pain. 180 tablet 0   mometasone (NASONEX) 50 MCG/ACT nasal spray PLACE 2 SPRAYS INTO THE NOSE DAILY. 51 each 4   norethindrone (INCASSIA) 0.35 MG tablet Take 1 tablet (0.35 mg total) by mouth daily. 28 tablet 11   ondansetron (ZOFRAN-ODT) 8 MG disintegrating tablet DISSOLVE 1 TABLET ON TONGUE EVERY 8 HOURS AS NEEDED FOR NAUSEA AND VOMITING 20 tablet 1   tiZANidine (ZANAFLEX) 4 MG tablet Take 1 tablet (4 mg total) by mouth every 6 (six) hours as needed for muscle spasms. 90 tablet 1   traMADol  (ULTRAM) 50 MG tablet Take 1 tablet (50 mg total) by mouth every 8 (eight) hours as needed (mod pain). 30 tablet 0   valACYclovir (VALTREX) 1000 MG tablet Take 1 tablet (1,000 mg total) by mouth 3 (three) times daily. 30 tablet 2   No current facility-administered medications for this visit.    Allergies: Ketorolac tromethamine, Morphine, Seasonal ic [octacosanol], and Latex  Past Medical History:  Diagnosis Date   Abnormal Pap smear    Repeats WNL   ADD (attention deficit disorder)    Anxiety    Arthritis    Oseteoarthritis   Depression    GERD (gastroesophageal reflux disease)    Headache(784.0)    Migraines   Junctional escape rhythm    Neuromuscular disorder (HCC)    Fibromyalgia   Pregnancy induced hypertension    Supraventricular tachycardia     Past Surgical History:  Procedure Laterality Date   ABLATION OF DYSRHYTHMIC FOCUS     APPENDECTOMY  1986   CARPAL TUNNEL RELEASE  2000   CERVICAL FUSION  08-24-99,04-26-00,05-09-01   Knee sugery Left    16 yrs. old   SHOULDER SURGERY  left-6-04, right 09-12-04   tongue polyp  1984    Family History  Problem Relation Age of Onset   Migraines Mother    COPD Mother    Heart disease Mother        chf   Depression Mother    Hodgkin's lymphoma Father    ADD / ADHD Brother    Diabetes Other    Thyroid disease Other    Hypertension Other    Depression Other    Bipolar disorder Other    Cancer Cousin     Social History   Tobacco Use   Smoking status: Former    Types: Cigarettes    Quit date: 10/23/2007    Years since quitting: 14.9   Smokeless tobacco: Never  Substance Use Topics   Alcohol use: Yes    Alcohol/week: 0.0 standard drinks of alcohol    Comment: Occ    Subjective:   Started yesterday with sinus congestion/ headache; is having secondary migraines; is prone to recurrent sinus issues; no fever, shortness of breath;   Objective:  Vitals:   09/26/22 1500  BP: 136/78  Pulse: 85  Resp: 18  SpO2: 99%   Weight: 285 lb (129.3 kg)  Height: '5\' 7"'$  (1.702 m)    General: Well developed, well nourished, in no acute distress  Skin : Warm and dry.  Head: Normocephalic and atraumatic  Eyes: Sclera and conjunctiva clear; pupils round and reactive to light; extraocular movements intact  Ears: External normal; canals clear; tympanic membranes normal  Oropharynx: Pink, supple. No suspicious lesions  Neck: Supple without thyromegaly, adenopathy  Lungs: Respirations unlabored; clear to auscultation bilaterally without wheeze, rales, rhonchi  CVS exam: normal rate and regular rhythm.  Neurologic: Alert and oriented; speech intact;  face symmetrical; moves all extremities well; CNII-XII intact without focal deficit   Assessment:  1. Acute cough   2. Viral URI     Plan:  Rapid flu and COVID both negative in office; reassurance provided- encouraged symptomatic treatment; continue her Zyrtec and Nasonex and can use her Netti Pot; increase fluids, rest and follow up worse, no better; work note give as requested.   No follow-ups on file.  Orders Placed This Encounter  Procedures   POC COVID-19    Order Specific Question:   Previously tested for COVID-19    Answer:   No    Order Specific Question:   Resident in a congregate (group) care setting    Answer:   No    Order Specific Question:   Employed in healthcare setting    Answer:   No    Order Specific Question:   Pregnant    Answer:   No   POCT Influenza A/B    Requested Prescriptions    No prescriptions requested or ordered in this encounter

## 2022-09-26 NOTE — Patient Instructions (Addendum)
Your COVID and flu are negative; I think your symptoms are viral in nature; Please continue to use your Zyrtec and Nasonex.

## 2022-09-29 ENCOUNTER — Other Ambulatory Visit (HOSPITAL_COMMUNITY): Payer: Self-pay

## 2022-09-29 ENCOUNTER — Other Ambulatory Visit: Payer: Self-pay

## 2022-10-02 ENCOUNTER — Ambulatory Visit: Payer: BC Managed Care – PPO | Admitting: Family Medicine

## 2022-10-02 ENCOUNTER — Encounter: Payer: Self-pay | Admitting: Family Medicine

## 2022-10-02 ENCOUNTER — Other Ambulatory Visit (HOSPITAL_COMMUNITY): Payer: Self-pay

## 2022-10-02 ENCOUNTER — Encounter: Payer: Self-pay | Admitting: Family

## 2022-10-02 ENCOUNTER — Other Ambulatory Visit: Payer: Self-pay | Admitting: Family

## 2022-10-02 DIAGNOSIS — R053 Chronic cough: Secondary | ICD-10-CM

## 2022-10-02 MED ORDER — BENZONATATE 200 MG PO CAPS: 200.0000 mg | ORAL_CAPSULE | Freq: Three times a day (TID) | ORAL | 0 refills | Status: DC | PRN

## 2022-10-03 ENCOUNTER — Ambulatory Visit: Payer: BC Managed Care – PPO | Admitting: Family Medicine

## 2022-10-04 ENCOUNTER — Encounter: Payer: Self-pay | Admitting: Family Medicine

## 2022-10-04 ENCOUNTER — Telehealth: Payer: Self-pay | Admitting: Family Medicine

## 2022-10-04 ENCOUNTER — Telehealth: Payer: BC Managed Care – PPO | Admitting: Physician Assistant

## 2022-10-04 NOTE — Telephone Encounter (Signed)
Called pt to present with possible solutions. Pt stated that she has made an appt with Urgent Care and expressed she was very disappointed in what occurred with the cancellation of the appt at [facility]. Pt stated that she may be looking for a different doctors office after this as she as never had a doctors office treat appts with such low priority. Advised that this was a completely different office that facilitated this and was unsure as to why this happened. Offered pt to reach out to [place] if there is anything else we can help with. Pt offered thanks and disconnected call.

## 2022-10-04 NOTE — Telephone Encounter (Signed)
Pt called very upset stating that she had scheduled an appt with [facility] for still having issues with her cold/cough that was cancelled and they had told her to follow up with her PCP for this issue via MyChart message. After reviewing chart, was unable to locate message but assume it may have been sent via Pt Schedules where it can't be seen. Pt stated she is still having issues with the cold/cough that she is having and is in eructating pain for her back. Pt stated that she really needs to go to the ED but she can't leave her two kids alone to go. During time when chart was being reviewed further for relevant information into both issues, pt stated that she was wanting see if an adult could help her out with this and shortly after stating this, said "I guess we'll see if we wake up tomorrow" and disconnected the call. Please advise.

## 2022-10-05 NOTE — Telephone Encounter (Signed)
I also called pt today and left a message to see if she wanted to sch a VV here today with her provider/.

## 2022-10-06 ENCOUNTER — Ambulatory Visit: Payer: BC Managed Care – PPO | Admitting: Neurology

## 2022-10-06 ENCOUNTER — Ambulatory Visit: Payer: BC Managed Care – PPO | Admitting: Family Medicine

## 2022-10-06 DIAGNOSIS — G43709 Chronic migraine without aura, not intractable, without status migrainosus: Secondary | ICD-10-CM | POA: Diagnosis not present

## 2022-10-06 NOTE — Progress Notes (Signed)
[facility]  ° °Procedure Note Botox ° °Attending: Dr. Langston Tuberville ° °Preoperative Diagnosis(es): Chronic migraine ° °Consent obtained from: The patient °Benefits discussed included, but were not limited to decreased muscle tightness, increased joint range of motion, and decreased pain.  Risk discussed included, but were not limited pain and discomfort, bleeding, bruising, excessive weakness, venous thrombosis, muscle atrophy and dysphagia.  Anticipated outcomes of the procedure as well as he risks and benefits of the alternatives to the procedure, and the roles and tasks of the personnel to be involved, were discussed with the patient, and the patient consents to the procedure and agrees to proceed. A copy of the patient medication guide was given to the patient which explains the blackbox warning. ° °Patients identity and treatment sites confirmed Yes.  . ° °Details of Procedure: °Skin was cleaned with alcohol. Prior to injection, the needle plunger was aspirated to make sure the needle was not within a blood vessel.  There was no blood retrieved on aspiration.   ° °Following is a summary of the muscles injected  And the amount of Botulinum toxin used: ° °Dilution °200 units of Botox was reconstituted with 4 ml of preservative free normal saline. °Time of reconstitution: At the time of the office visit (<30 minutes prior to injection)  ° °Injections  °155 total units of Botox was injected with a 30 gauge needle. ° °Injection Sites: °L occipitalis: 15 units- 3 sites  °R occiptalis: 15 units- 3 sites ° °L upper trapezius: 15 units- 3 sites °R upper trapezius: 15 units- 3 sits          °L paraspinal: 10 units- 2 sites °R paraspinal: 10 units- 2 sites ° °Face °L frontalis(2 injection sites):10 units   °R frontalis(2 injection sites):10 units         °L corrugator: 5 units   °R corrugator: 5 units           °Procerus: 5 units   °L temporalis: 20 units °R temporalis: 20 units  ° °Agent:  °200 units of botulinum Type  A (Onobotulinum Toxin type A) was reconstituted with 4 ml of preservative free normal saline.  °Time of reconstitution: At the time of the office visit (<30 minutes prior to injection)  ° ° ° Total injected (Units):  155 ° Total wasted (Units):  45 ° °Patient tolerated procedure well without complications.   °Reinjection is anticipated in 3 months. ° ° °

## 2022-10-13 ENCOUNTER — Other Ambulatory Visit (HOSPITAL_COMMUNITY): Payer: Self-pay

## 2022-10-22 ENCOUNTER — Other Ambulatory Visit: Payer: Self-pay | Admitting: Family Medicine

## 2022-10-22 ENCOUNTER — Encounter: Payer: Self-pay | Admitting: Neurology

## 2022-10-26 ENCOUNTER — Other Ambulatory Visit: Payer: Self-pay

## 2022-11-07 ENCOUNTER — Other Ambulatory Visit: Payer: Self-pay | Admitting: Family Medicine

## 2022-11-07 DIAGNOSIS — F320 Major depressive disorder, single episode, mild: Secondary | ICD-10-CM

## 2022-11-16 ENCOUNTER — Encounter: Payer: Self-pay | Admitting: Family Medicine

## 2022-11-16 NOTE — Telephone Encounter (Signed)
FYI

## 2022-11-17 ENCOUNTER — Encounter: Payer: Self-pay | Admitting: Family Medicine

## 2022-11-17 ENCOUNTER — Ambulatory Visit: Payer: BC Managed Care – PPO | Admitting: Family Medicine

## 2022-11-17 VITALS — BP 132/88 | HR 77 | Temp 98.2°F | Resp 18 | Ht 67.0 in | Wt 299.0 lb

## 2022-11-17 DIAGNOSIS — M797 Fibromyalgia: Secondary | ICD-10-CM | POA: Diagnosis not present

## 2022-11-17 DIAGNOSIS — N951 Menopausal and female climacteric states: Secondary | ICD-10-CM | POA: Diagnosis not present

## 2022-11-17 DIAGNOSIS — Z30011 Encounter for initial prescription of contraceptive pills: Secondary | ICD-10-CM | POA: Diagnosis not present

## 2022-11-17 DIAGNOSIS — Z6841 Body Mass Index (BMI) 40.0 and over, adult: Secondary | ICD-10-CM | POA: Diagnosis not present

## 2022-11-17 DIAGNOSIS — I10 Essential (primary) hypertension: Secondary | ICD-10-CM | POA: Diagnosis not present

## 2022-11-17 DIAGNOSIS — Z0000 Encounter for general adult medical examination without abnormal findings: Secondary | ICD-10-CM

## 2022-11-17 LAB — CBC WITH DIFFERENTIAL/PLATELET
Basophils Absolute: 0 10*3/uL (ref 0.0–0.1)
Basophils Relative: 0.6 % (ref 0.0–3.0)
Eosinophils Absolute: 0.2 10*3/uL (ref 0.0–0.7)
Eosinophils Relative: 2.7 % (ref 0.0–5.0)
HCT: 42.2 % (ref 36.0–46.0)
Hemoglobin: 14.6 g/dL (ref 12.0–15.0)
Lymphocytes Relative: 19.5 % (ref 12.0–46.0)
Lymphs Abs: 1.1 10*3/uL (ref 0.7–4.0)
MCHC: 34.6 g/dL (ref 30.0–36.0)
MCV: 86 fl (ref 78.0–100.0)
Monocytes Absolute: 0.5 10*3/uL (ref 0.1–1.0)
Monocytes Relative: 9.1 % (ref 3.0–12.0)
Neutro Abs: 3.8 10*3/uL (ref 1.4–7.7)
Neutrophils Relative %: 68.1 % (ref 43.0–77.0)
Platelets: 231 10*3/uL (ref 150.0–400.0)
RBC: 4.9 Mil/uL (ref 3.87–5.11)
RDW: 12.7 % (ref 11.5–15.5)
WBC: 5.6 10*3/uL (ref 4.0–10.5)

## 2022-11-17 LAB — LIPID PANEL
Cholesterol: 184 mg/dL (ref 0–200)
HDL: 84.3 mg/dL (ref >39.00)
LDL Cholesterol: 91 mg/dL (ref 0–99)
NonHDL: 99.85
Total CHOL/HDL Ratio: 2
Triglycerides: 43 mg/dL (ref 0.0–149.0)
VLDL: 8.6 mg/dL (ref 0.0–40.0)

## 2022-11-17 LAB — COMPREHENSIVE METABOLIC PANEL
ALT: 19 U/L (ref 0–35)
AST: 14 U/L (ref 0–37)
Albumin: 4.1 g/dL (ref 3.5–5.2)
Alkaline Phosphatase: 76 U/L (ref 39–117)
BUN: 13 mg/dL (ref 6–23)
CO2: 30 mEq/L (ref 19–32)
Calcium: 9.1 mg/dL (ref 8.4–10.5)
Chloride: 99 mEq/L (ref 96–112)
Creatinine, Ser: 0.68 mg/dL (ref 0.40–1.20)
GFR: 101.26 mL/min (ref >60.00)
Glucose, Bld: 96 mg/dL (ref 70–99)
Potassium: 4.6 mEq/L (ref 3.5–5.1)
Sodium: 136 mEq/L (ref 135–145)
Total Bilirubin: 0.5 mg/dL (ref 0.2–1.2)
Total Protein: 7 g/dL (ref 6.0–8.3)

## 2022-11-17 LAB — HEMOGLOBIN A1C: Hgb A1c MFr Bld: 5.3 % (ref 4.6–6.5)

## 2022-11-17 LAB — VITAMIN D 25 HYDROXY (VIT D DEFICIENCY, FRACTURES): VITD: 29.55 ng/mL — ABNORMAL LOW (ref 30.00–100.00)

## 2022-11-17 LAB — TSH: TSH: 2.21 u[IU]/mL (ref 0.35–5.50)

## 2022-11-17 MED ORDER — NORETHINDRONE 0.35 MG PO TABS: 1.0000 | ORAL_TABLET | Freq: Every day | ORAL | 11 refills | Status: AC

## 2022-11-17 MED ORDER — ZEPBOUND 2.5 MG/0.5ML ~~LOC~~ SOAJ: 2.5000 mg | SUBCUTANEOUS | 0 refills | Status: DC

## 2022-11-17 MED ORDER — HYDROCODONE-ACETAMINOPHEN 10-325 MG PO TABS: 1.0000 | ORAL_TABLET | Freq: Four times a day (QID) | ORAL | 0 refills | Status: DC | PRN

## 2022-11-17 MED ORDER — GABAPENTIN 100 MG PO CAPS: 100.0000 mg | ORAL_CAPSULE | Freq: Two times a day (BID) | ORAL | 3 refills | Status: DC

## 2022-11-17 NOTE — Assessment & Plan Note (Signed)
Will try to get zepbound covered  Refer to healthy weight and wellness

## 2022-11-17 NOTE — Progress Notes (Signed)
Established Patient Office Visit  Subjective   Patient ID: Jo Jackson, female    DOB: 06-09-1971  Age: 52 y.o. MRN: [id number]  Chief Complaint  Patient presents with   Obesity   Hot Flashes    HPI  Patient Active Problem List   Diagnosis Date Noted   Hot flashes due to menopause 11/17/2022   Preventative health care 04/03/2022   Skin tag 11/03/2021   Inflamed skin tag 11/03/2021   Morbid obesity (HCC) 09/27/2021   Migraine 12/21/2020   Muscle spasm 12/21/2020   Nausea 12/21/2020   Primary hypertension 12/03/2020   Dizzy 08/18/2020   Adult ADHD 06/04/2019   Social anxiety disorder 06/04/2019   Influenza 04/15/2018   Recurrent acute serous otitis media of right ear 03/22/2018   Vaginal irritation 03/22/2018   Pansinusitis 10/12/2017   Viral URI 05/28/2017   Skin tag of perianal region 11/20/2016   Rectal bleeding 08/20/2015   Bronchitis 06/04/2015   Sensation of fullness in right ear 04/07/2015   Cervicogenic headache 11/06/2014   Obesity (BMI 30-39.9) 06/24/2013   Morbid obesity with BMI of 40.0-44.9, adult (HCC) 06/07/2013   SVD (spontaneous vaginal delivery) 12/15/2012    Class: Status post   HAIR LOSS 08/11/2010   FATIGUE 08/11/2010   SINUSITIS - ACUTE-NOS 06/29/2010   GERD 06/29/2010   PAIN IN THORACIC SPINE 05/12/2010   Migraine without aura 02/10/2010   ELEVATED BLOOD PRESSURE WITHOUT DIAGNOSIS OF HYPERTENSION 02/10/2010   AMENORRHEA 04/26/2009   GAD (generalized anxiety disorder) 03/26/2009   Major depressive disorder, recurrent episode, in full remission (HCC) 03/26/2009   ALLERGIC REACTION 08/26/2008   Fibromyalgia 03/05/2007   ARTHROSCOPY, LEFT KNEE, HX OF 03/05/2007   Headache 01/31/2007   HX, PERSONAL, TOBACCO USE 01/31/2007   Past Medical History:  Diagnosis Date   Abnormal Pap smear    Repeats WNL   ADD (attention deficit disorder)    Anxiety    Arthritis    Oseteoarthritis   Depression    GERD (gastroesophageal reflux disease)     Headache(784.0)    Migraines   Junctional escape rhythm    Neuromuscular disorder (HCC)    Fibromyalgia   Pregnancy induced hypertension    Supraventricular tachycardia    Past Surgical History:  Procedure Laterality Date   ABLATION OF DYSRHYTHMIC FOCUS     APPENDECTOMY  1986   CARPAL TUNNEL RELEASE  2000   CERVICAL FUSION  08-24-99,04-26-00,05-09-01   Knee sugery Left    16 yrs. old   SHOULDER SURGERY  left-6-04, right 09-12-04   tongue polyp  1984   Social History   Tobacco Use   Smoking status: Former    Types: Cigarettes    Quit date: 10/23/2007    Years since quitting: 15.0   Smokeless tobacco: Never  Vaping Use   Vaping Use: Never used  Substance Use Topics   Alcohol use: Yes    Alcohol/week: 0.0 standard drinks of alcohol    Comment: Occ   Drug use: No   Social History   Socioeconomic History   Marital status: Widowed    Spouse name: Not on file   Number of children: 2   Years of education: Not on file   Highest education level: Some college, no degree  Occupational History    Employer: [employer]    Comment: P/T   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    Employer: [employer]  Tobacco Use   Smoking status: Former    Types: Cigarettes    Quit  date: 10/23/2007    Years since quitting: 15.0   Smokeless tobacco: Never  Vaping Use   Vaping Use: Never used  Substance and Sexual Activity   Alcohol use: Yes    Alcohol/week: 0.0 standard drinks of alcohol    Comment: Occ   Drug use: No   Sexual activity: Not Currently    Partners: Male  Other Topics Concern   Not on file  Social History Narrative   G2p2      Exercise- no      Right handed   Social Determinants of Health   Financial Resource Strain: Medium Risk (11/10/2022)   Overall Financial Resource Strain (CARDIA)    Difficulty of Paying Living Expenses: Somewhat hard  Food Insecurity: Food Insecurity Present (11/10/2022)   Hunger Vital Sign    Worried About Running Out of Food in the Last Year:  Sometimes true    Ran Out of Food in the Last Year: Sometimes true  Transportation Needs: Unmet Transportation Needs (11/10/2022)   PRAPARE - Transportation    Lack of Transportation (Medical): Yes    Lack of Transportation (Non-Medical): Yes  Physical Activity: Insufficiently Active (11/10/2022)   Exercise Vital Sign    Days of Exercise per Week: 3 days    Minutes of Exercise per Session: 30 min  Stress: Stress Concern Present (11/10/2022)   [employer] of Occupational Health - Occupational Stress Questionnaire    Feeling of Stress : Very much  Social Connections: Moderately Isolated (11/10/2022)   Social Connection and Isolation Panel [NHANES]    Frequency of Communication with Friends and Family: More than three times a week    Frequency of Social Gatherings with Friends and Family: More than three times a week    Attends Religious Services: Never    Database administrator or Organizations: Yes    Attends Banker Meetings: 1 to 4 times per year    Marital Status: Widowed  Catering manager Violence: Not on file   Family Status  Relation Name Status   Mother  Deceased at age 34   Father  Deceased at age 71   Brother  (Not Specified)   Oceanographer  Alive   Other  (Not Specified)   Other  (Not Specified)   Other  (Not Specified)   Other  (Not Specified)   Other  (Not Specified)   Cousin  Deceased       breast   Family History  Problem Relation Age of Onset   Migraines Mother    COPD Mother    Heart disease Mother        chf   Depression Mother    Hodgkin's lymphoma Father    ADD / ADHD Brother    Diabetes Other    Thyroid disease Other    Hypertension Other    Depression Other    Bipolar disorder Other    Cancer Cousin    Allergies  Allergen Reactions   Ketorolac Tromethamine Hives   Morphine Other (See Comments)    "makes me the devil."   Seasonal Ic [Octacosanol]    Latex Rash and Other (See Comments)    Latex catheter caused extreme irritation       Review of Systems  Constitutional:  Negative for fever and malaise/fatigue.  HENT:  Negative for congestion.   Eyes:  Negative for blurred vision.  Respiratory:  Negative for shortness of breath.   Cardiovascular:  Negative for chest pain, palpitations and leg swelling.  Gastrointestinal:  Negative for abdominal pain, blood in stool and nausea.  Genitourinary:  Negative for dysuria and frequency.  Musculoskeletal:  Negative for falls.  Skin:  Negative for rash.  Neurological:  Negative for dizziness, loss of consciousness and headaches.  Endo/Heme/Allergies:  Negative for environmental allergies.  Psychiatric/Behavioral:  Negative for depression. The patient is not nervous/anxious.       Objective:     BP 132/88 (BP Location: Right Arm, Patient Position: Sitting, Cuff Size: Large)   Pulse 77   Temp 98.2 F (36.8 C) (Oral)   Resp 18   Ht 5\' 7"  (1.702 m)   Wt 299 lb (135.6 kg)   SpO2 99%   BMI 46.83 kg/m  BP Readings from Last 3 Encounters:  11/17/22 132/88  09/26/22 136/78  09/15/22 (!) 156/88   Wt Readings from Last 3 Encounters:  11/17/22 299 lb (135.6 kg)  09/26/22 285 lb (129.3 kg)  09/15/22 297 lb (134.7 kg)   SpO2 Readings from Last 3 Encounters:  11/17/22 99%  09/26/22 99%  09/15/22 96%      Physical Exam Vitals and nursing note reviewed.  Constitutional:      Appearance: She is well-developed.  HENT:     Head: Normocephalic and atraumatic.  Eyes:     Conjunctiva/sclera: Conjunctivae normal.  Neck:     Thyroid: No thyromegaly.     Vascular: No carotid bruit or JVD.  Cardiovascular:     Rate and Rhythm: Normal rate and regular rhythm.     Heart sounds: Normal heart sounds. No murmur heard. Pulmonary:     Effort: Pulmonary effort is normal. No respiratory distress.     Breath sounds: Normal breath sounds. No wheezing or rales.  Chest:     Chest wall: No tenderness.  Musculoskeletal:     Cervical back: Normal range of motion and neck  supple.  Neurological:     General: No focal deficit present.     Mental Status: She is alert and oriented to person, place, and time.      No results found for any visits on 11/17/22.   + Last CBC Lab Results  Component Value Date   WBC 5.0 04/03/2022   HGB 14.0 04/03/2022   HCT 40.7 04/03/2022   MCV 84.2 04/03/2022   MCH 28.5 08/20/2015   RDW 12.9 04/03/2022   PLT 218.0 04/03/2022   Last metabolic panel Lab Results  Component Value Date   GLUCOSE 90 04/03/2022   NA 138 04/03/2022   K 3.9 04/03/2022   CL 99 04/03/2022   CO2 30 04/03/2022   BUN 9 04/03/2022   CREATININE 0.80 04/03/2022   GFRNONAA >90 12/13/2012   CALCIUM 9.3 04/03/2022   PROT 6.9 04/03/2022   ALBUMIN 4.1 04/03/2022   BILITOT 0.9 04/03/2022   ALKPHOS 62 04/03/2022   AST 19 04/03/2022   ALT 19 04/03/2022   Last lipids Lab Results  Component Value Date   CHOL 170 04/03/2022   HDL 63.20 04/03/2022   LDLCALC 96 04/03/2022   TRIG 56.0 04/03/2022   CHOLHDL 3 04/03/2022   Last hemoglobin A1c Lab Results  Component Value Date   HGBA1C 5.4 06/06/2013   Last thyroid functions Lab Results  Component Value Date   TSH 2.03 04/03/2022   Last vitamin D No results found for: "25OHVITD2", "25OHVITD3", "VD25OH" Last vitamin B12 and Folate Lab Results  Component Value Date   VITAMINB12 766 08/11/2010   FOLATE 18.0 08/11/2010      The 10-year  ASCVD risk score (Arnett DK, et al., 2019) is: 2.5%    Assessment & Plan:   Problem List Items Addressed This Visit       Unprioritized   Primary hypertension   Relevant Orders   CBC with Differential/Platelet   Comprehensive metabolic panel   Lipid panel   TSH   Preventative health care   Relevant Medications   HYDROcodone-acetaminophen (NORCO) 10-325 MG tablet   Morbid obesity with BMI of 40.0-44.9, adult (HCC)    Will try to get zepbound covered  Refer to healthy weight and wellness       Relevant Medications   tirzepatide (ZEPBOUND)  2.5 MG/0.5ML Pen   Other Relevant Orders   VITAMIN D 25 Hydroxy (Vit-D Deficiency, Fractures)   Hemoglobin A1c   Insulin, random   CBC with Differential/Platelet   Comprehensive metabolic panel   Lipid panel   TSH   Amb Ref to Medical Weight Management   Hot flashes due to menopause - Primary    Neurontin 100 mg 1-2 a day #60, 2 refills  [place] not covered        Relevant Medications   gabapentin (NEURONTIN) 100 MG capsule   Other Relevant Orders   VITAMIN D 25 Hydroxy (Vit-D Deficiency, Fractures)   Hemoglobin A1c   Insulin, random   Fibromyalgia   Relevant Medications   HYDROcodone-acetaminophen (NORCO) 10-325 MG tablet   gabapentin (NEURONTIN) 100 MG capsule   Other Visit Diagnoses     Encounter for initial prescription of contraceptive pills       Relevant Medications   norethindrone (INCASSIA) 0.35 MG tablet       No follow-ups on file.    [name], DO

## 2022-11-17 NOTE — Assessment & Plan Note (Signed)
Neurontin 100 mg 1-2 a day #60, 2 refills  [place] not covered

## 2022-11-20 ENCOUNTER — Encounter: Payer: Self-pay | Admitting: Family Medicine

## 2022-11-20 LAB — INSULIN, RANDOM: Insulin: 11.6 u[IU]/mL

## 2022-11-22 ENCOUNTER — Other Ambulatory Visit: Payer: Self-pay

## 2022-11-22 MED ORDER — VITAMIN D (ERGOCALCIFEROL) 1.25 MG (50000 UNIT) PO CAPS: 50000.0000 [IU] | ORAL_CAPSULE | ORAL | 1 refills | Status: DC

## 2022-12-19 ENCOUNTER — Other Ambulatory Visit: Payer: Self-pay

## 2022-12-20 ENCOUNTER — Other Ambulatory Visit: Payer: Self-pay

## 2022-12-22 ENCOUNTER — Other Ambulatory Visit (HOSPITAL_COMMUNITY): Payer: Self-pay

## 2022-12-28 ENCOUNTER — Other Ambulatory Visit: Payer: Self-pay | Admitting: Family Medicine

## 2022-12-28 DIAGNOSIS — G43809 Other migraine, not intractable, without status migrainosus: Secondary | ICD-10-CM

## 2022-12-28 DIAGNOSIS — M797 Fibromyalgia: Secondary | ICD-10-CM

## 2022-12-28 DIAGNOSIS — Z0000 Encounter for general adult medical examination without abnormal findings: Secondary | ICD-10-CM

## 2022-12-28 NOTE — Telephone Encounter (Signed)
Requesting: tramadol and hydrocodone Contract: Yes UDS: 04/03/22 Last Visit: 11/17/2022 Next Visit: Visit date not found Last Refill: hydrocodone 11/17/22  tramadol 07/31/22  Please Advise

## 2022-12-29 ENCOUNTER — Encounter: Payer: Self-pay | Admitting: Family Medicine

## 2022-12-29 MED ORDER — HYDROCODONE-ACETAMINOPHEN 10-325 MG PO TABS
1.0000 | ORAL_TABLET | Freq: Four times a day (QID) | ORAL | 0 refills | Status: DC | PRN
Start: 2022-12-29 — End: 2023-08-09

## 2022-12-29 MED ORDER — TRAMADOL HCL 50 MG PO TABS
50.0000 mg | ORAL_TABLET | Freq: Three times a day (TID) | ORAL | 0 refills | Status: DC | PRN
Start: 2022-12-29 — End: 2023-07-19

## 2023-01-01 ENCOUNTER — Telehealth: Payer: Self-pay

## 2023-01-01 ENCOUNTER — Encounter: Payer: Self-pay | Admitting: Family Medicine

## 2023-01-01 NOTE — Telephone Encounter (Signed)
PA approved. Effective 01/01/2023 to 06/30/2023

## 2023-01-01 NOTE — Telephone Encounter (Signed)
PA initiated via Covermymeds; KEY: [id number]. Awaiting determination.

## 2023-01-05 ENCOUNTER — Ambulatory Visit (INDEPENDENT_AMBULATORY_CARE_PROVIDER_SITE_OTHER): Payer: BC Managed Care – PPO | Admitting: Neurology

## 2023-01-05 DIAGNOSIS — G43709 Chronic migraine without aura, not intractable, without status migrainosus: Secondary | ICD-10-CM

## 2023-01-05 MED ORDER — ONABOTULINUMTOXINA 100 UNITS IJ SOLR
200.0000 [IU] | Freq: Once | INTRAMUSCULAR | Status: AC
Start: 2023-01-05 — End: 2023-01-05
  Administered 2023-01-05: 155 [IU] via INTRAMUSCULAR

## 2023-01-05 NOTE — Progress Notes (Signed)
[facility]  ° °Procedure Note Botox ° °Attending: Dr. Lyfe Reihl ° °Preoperative Diagnosis(es): Chronic migraine ° °Consent obtained from: The patient °Benefits discussed included, but were not limited to decreased muscle tightness, increased joint range of motion, and decreased pain.  Risk discussed included, but were not limited pain and discomfort, bleeding, bruising, excessive weakness, venous thrombosis, muscle atrophy and dysphagia.  Anticipated outcomes of the procedure as well as he risks and benefits of the alternatives to the procedure, and the roles and tasks of the personnel to be involved, were discussed with the patient, and the patient consents to the procedure and agrees to proceed. A copy of the patient medication guide was given to the patient which explains the blackbox warning. ° °Patients identity and treatment sites confirmed Yes.  . ° °Details of Procedure: °Skin was cleaned with alcohol. Prior to injection, the needle plunger was aspirated to make sure the needle was not within a blood vessel.  There was no blood retrieved on aspiration.   ° °Following is a summary of the muscles injected  And the amount of Botulinum toxin used: ° °Dilution °200 units of Botox was reconstituted with 4 ml of preservative free normal saline. °Time of reconstitution: At the time of the office visit (<30 minutes prior to injection)  ° °Injections  °155 total units of Botox was injected with a 30 gauge needle. ° °Injection Sites: °L occipitalis: 15 units- 3 sites  °R occiptalis: 15 units- 3 sites ° °L upper trapezius: 15 units- 3 sites °R upper trapezius: 15 units- 3 sits          °L paraspinal: 10 units- 2 sites °R paraspinal: 10 units- 2 sites ° °Face °L frontalis(2 injection sites):10 units   °R frontalis(2 injection sites):10 units         °L corrugator: 5 units   °R corrugator: 5 units           °Procerus: 5 units   °L temporalis: 20 units °R temporalis: 20 units  ° °Agent:  °200 units of botulinum Type  A (Onobotulinum Toxin type A) was reconstituted with 4 ml of preservative free normal saline.  °Time of reconstitution: At the time of the office visit (<30 minutes prior to injection)  ° ° ° Total injected (Units):  155 ° Total wasted (Units):  45 ° °Patient tolerated procedure well without complications.   °Reinjection is anticipated in 3 months. ° ° °

## 2023-01-13 ENCOUNTER — Encounter: Payer: Self-pay | Admitting: Family Medicine

## 2023-01-31 ENCOUNTER — Encounter: Payer: Self-pay | Admitting: Family Medicine

## 2023-02-01 ENCOUNTER — Encounter: Payer: Self-pay | Admitting: Family

## 2023-02-01 ENCOUNTER — Encounter: Payer: Self-pay | Admitting: Family Medicine

## 2023-02-01 ENCOUNTER — Ambulatory Visit: Payer: BC Managed Care – PPO | Admitting: Family Medicine

## 2023-02-01 VITALS — BP 138/80 | HR 78 | Ht 67.0 in | Wt 296.6 lb

## 2023-02-01 DIAGNOSIS — M791 Myalgia, unspecified site: Secondary | ICD-10-CM

## 2023-02-01 DIAGNOSIS — R110 Nausea: Secondary | ICD-10-CM | POA: Diagnosis not present

## 2023-02-01 DIAGNOSIS — Z79899 Other long term (current) drug therapy: Secondary | ICD-10-CM

## 2023-02-01 DIAGNOSIS — R051 Acute cough: Secondary | ICD-10-CM | POA: Insufficient documentation

## 2023-02-01 DIAGNOSIS — J029 Acute pharyngitis, unspecified: Secondary | ICD-10-CM

## 2023-02-01 DIAGNOSIS — M797 Fibromyalgia: Secondary | ICD-10-CM

## 2023-02-01 DIAGNOSIS — M62838 Other muscle spasm: Secondary | ICD-10-CM

## 2023-02-01 LAB — POCT RAPID STREP A (OFFICE): Rapid Strep A Screen: NEGATIVE

## 2023-02-01 LAB — POC COVID19 BINAXNOW: SARS Coronavirus 2 Ag: NEGATIVE

## 2023-02-01 MED ORDER — PROMETHAZINE-DM 6.25-15 MG/5ML PO SYRP: 5.0000 mL | ORAL_SOLUTION | Freq: Four times a day (QID) | ORAL | 0 refills | Status: DC | PRN

## 2023-02-01 MED ORDER — TIZANIDINE HCL 4 MG PO TABS
4.0000 mg | ORAL_TABLET | Freq: Four times a day (QID) | ORAL | 1 refills | Status: DC | PRN
Start: 2023-02-01 — End: 2023-03-16

## 2023-02-01 MED ORDER — ONDANSETRON 8 MG PO TBDP
8.0000 mg | ORAL_TABLET | Freq: Three times a day (TID) | ORAL | 1 refills | Status: DC | PRN
Start: 2023-02-01 — End: 2023-03-14

## 2023-02-01 MED ORDER — VITAMIN D (ERGOCALCIFEROL) 1.25 MG (50000 UNIT) PO CAPS
50000.0000 [IU] | ORAL_CAPSULE | ORAL | 1 refills | Status: DC
Start: 2023-02-01 — End: 2023-10-11

## 2023-02-01 MED ORDER — AMOXICILLIN 875 MG PO TABS
875.0000 mg | ORAL_TABLET | Freq: Two times a day (BID) | ORAL | 0 refills | Status: DC
Start: 2023-02-01 — End: 2023-02-06

## 2023-02-01 NOTE — Addendum Note (Signed)
Addended by: Roxanne Gates on: 02/01/2023 04:51 PM   Modules accepted: Orders

## 2023-02-01 NOTE — Addendum Note (Signed)
Addended by: Thelma Barge D on: 02/01/2023 05:01 PM   Modules accepted: Orders

## 2023-02-01 NOTE — Assessment & Plan Note (Signed)
Amoxicillin bid x 10 days  Cough syrup as needed  Covid and flu negative

## 2023-02-01 NOTE — Progress Notes (Signed)
Established Patient Office Visit  Subjective   Patient ID: Jo Jackson, female    DOB: 05/29/1971  Age: 52 y.o. MRN: [id number]  Chief Complaint  Patient presents with   Multiple concerns     HPI Discussed the use of AI scribe software for clinical note transcription with the patient, who gave verbal consent to proceed.  History of Present Illness   The patient, with a history of fibromyalgia, presents with a severe flare-up of symptoms, the worst he's experienced in years. He describes feeling like death, with generalized body discomfort. He also reports upper respiratory symptoms, including a dry spot on the soft palate, throat discomfort, and a developing cough. He denies fever and productive cough at this time but notes the onset of nasal congestion.  In addition to these symptoms, the patient is also experiencing gastrointestinal upset, including frequent loose stools, which he attributes to a recent change in diet to a ketogenic regimen. He reports that he has been consuming increased electrolytes and fats, as recommended for this diet, and has added B complex vitamins, D2, D3, and MCT oil capsules. He notes that the frequent bowel movements have made it necessary for him to stay close to a restroom and that over-the-counter remedies have not been effective in managing this symptom.  The patient also mentions a recent visit to an urgent care center, where he was told he was managing his symptoms correctly. He expresses a desire to continue with the ketogenic diet despite the gastrointestinal upset, hoping that the symptoms will resolve within four to six weeks as suggested by others who have followed this diet.      Patient Active Problem List   Diagnosis Date Noted   Pharyngitis 02/01/2023   Myalgia 02/01/2023   Acute cough 02/01/2023   Hot flashes due to menopause 11/17/2022   Preventative health care 04/03/2022   Skin tag 11/03/2021   Inflamed skin tag 11/03/2021    Morbid obesity (HCC) 09/27/2021   Migraine 12/21/2020   Muscle spasm 12/21/2020   Nausea 12/21/2020   Primary hypertension 12/03/2020   Dizzy 08/18/2020   Adult ADHD 06/04/2019   Social anxiety disorder 06/04/2019   Influenza 04/15/2018   Recurrent acute serous otitis media of right ear 03/22/2018   Vaginal irritation 03/22/2018   Pansinusitis 10/12/2017   Viral URI 05/28/2017   Skin tag of perianal region 11/20/2016   Rectal bleeding 08/20/2015   Bronchitis 06/04/2015   Sensation of fullness in right ear 04/07/2015   Cervicogenic headache 11/06/2014   Obesity (BMI 30-39.9) 06/24/2013   Morbid obesity with BMI of 40.0-44.9, adult (HCC) 06/07/2013   SVD (spontaneous vaginal delivery) 12/15/2012    Class: Status post   HAIR LOSS 08/11/2010   FATIGUE 08/11/2010   SINUSITIS - ACUTE-NOS 06/29/2010   GERD 06/29/2010   PAIN IN THORACIC SPINE 05/12/2010   Migraine without aura 02/10/2010   ELEVATED BLOOD PRESSURE WITHOUT DIAGNOSIS OF HYPERTENSION 02/10/2010   AMENORRHEA 04/26/2009   GAD (generalized anxiety disorder) 03/26/2009   Major depressive disorder, recurrent episode, in full remission (HCC) 03/26/2009   ALLERGIC REACTION 08/26/2008   Fibromyalgia 03/05/2007   ARTHROSCOPY, LEFT KNEE, HX OF 03/05/2007   Headache 01/31/2007   HX, PERSONAL, TOBACCO USE 01/31/2007   Past Medical History:  Diagnosis Date   Abnormal Pap smear    Repeats WNL   ADD (attention deficit disorder)    Anxiety    Arthritis    Oseteoarthritis   Depression    GERD (gastroesophageal reflux disease)  Headache(784.0)    Migraines   Junctional escape rhythm    Neuromuscular disorder (HCC)    Fibromyalgia   Pregnancy induced hypertension    Supraventricular tachycardia    Past Surgical History:  Procedure Laterality Date   ABLATION OF DYSRHYTHMIC FOCUS     APPENDECTOMY  1986   CARPAL TUNNEL RELEASE  2000   CERVICAL FUSION  08-24-99,04-26-00,05-09-01   Knee sugery Left    16 yrs. old    SHOULDER SURGERY  left-6-04, right 09-12-04   tongue polyp  1984   Social History   Tobacco Use   Smoking status: Former    Current packs/day: 0.00    Types: Cigarettes    Quit date: 10/23/2007    Years since quitting: 15.2   Smokeless tobacco: Never  Vaping Use   Vaping status: Never Used  Substance Use Topics   Alcohol use: Yes    Alcohol/week: 0.0 standard drinks of alcohol    Comment: Occ   Drug use: No   Social History   Socioeconomic History   Marital status: Widowed    Spouse name: Not on file   Number of children: 2   Years of education: Not on file   Highest education level: Some college, no degree  Occupational History    Employer: [employer]    Comment: P/T   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    Employer: [employer]  Tobacco Use   Smoking status: Former    Current packs/day: 0.00    Types: Cigarettes    Quit date: 10/23/2007    Years since quitting: 15.2   Smokeless tobacco: Never  Vaping Use   Vaping status: Never Used  Substance and Sexual Activity   Alcohol use: Yes    Alcohol/week: 0.0 standard drinks of alcohol    Comment: Occ   Drug use: No   Sexual activity: Not Currently    Partners: Male  Other Topics Concern   Not on file  Social History Narrative   G2p2      Exercise- no      Right handed   Social Determinants of Health   Financial Resource Strain: Medium Risk (11/10/2022)   Overall Financial Resource Strain (CARDIA)    Difficulty of Paying Living Expenses: Somewhat hard  Food Insecurity: Food Insecurity Present (11/10/2022)   Hunger Vital Sign    Worried About Running Out of Food in the Last Year: Sometimes true    Ran Out of Food in the Last Year: Sometimes true  Transportation Needs: Unmet Transportation Needs (11/10/2022)   PRAPARE - Transportation    Lack of Transportation (Medical): Yes    Lack of Transportation (Non-Medical): Yes  Physical Activity: Insufficiently Active (11/10/2022)   Exercise Vital Sign    Days of Exercise per  Week: 3 days    Minutes of Exercise per Session: 30 min  Stress: Stress Concern Present (11/10/2022)   [employer] of Occupational Health - Occupational Stress Questionnaire    Feeling of Stress : Very much  Social Connections: Moderately Isolated (11/10/2022)   Social Connection and Isolation Panel [NHANES]    Frequency of Communication with Friends and Family: More than three times a week    Frequency of Social Gatherings with Friends and Family: More than three times a week    Attends Religious Services: Never    Database administrator or Organizations: Yes    Attends Banker Meetings: 1 to 4 times per year    Marital Status: Widowed  Catering manager  Violence: Not on file   Family Status  Relation Name Status   Mother  Deceased at age 107   Father  Deceased at age 30   Brother  (Not Specified)   Oceanographer  Alive   Other  (Not Specified)   Other  (Not Specified)   Other  (Not Specified)   Other  (Not Specified)   Other  (Not Specified)   Cousin  Deceased       breast  No partnership data on file   Family History  Problem Relation Age of Onset   Migraines Mother    COPD Mother    Heart disease Mother        chf   Depression Mother    Hodgkin's lymphoma Father    ADD / ADHD Brother    Diabetes Other    Thyroid disease Other    Hypertension Other    Depression Other    Bipolar disorder Other    Cancer Cousin    Allergies  Allergen Reactions   Ketorolac Tromethamine Hives   Morphine Other (See Comments)    "makes me the devil."   Seasonal Ic [Octacosanol]    Latex Rash and Other (See Comments)    Latex catheter caused extreme irritation      Review of Systems  Constitutional:  Negative for fever and malaise/fatigue.  HENT:  Negative for congestion.   Eyes:  Negative for blurred vision.  Respiratory:  Negative for cough and shortness of breath.   Cardiovascular:  Negative for chest pain, palpitations and leg swelling.  Gastrointestinal:   Negative for abdominal pain, blood in stool, nausea and vomiting.  Genitourinary:  Negative for dysuria and frequency.  Musculoskeletal:  Negative for back pain and falls.  Skin:  Negative for rash.  Neurological:  Negative for dizziness, loss of consciousness and headaches.  Endo/Heme/Allergies:  Negative for environmental allergies.  Psychiatric/Behavioral:  Negative for depression. The patient is not nervous/anxious.       Objective:     BP 138/80   Pulse 78   Ht 5\' 7"  (1.702 m)   Wt 296 lb 9.6 oz (134.5 kg)   SpO2 96%   BMI 46.45 kg/m  BP Readings from Last 3 Encounters:  02/01/23 138/80  11/17/22 132/88  09/26/22 136/78   Wt Readings from Last 3 Encounters:  02/01/23 296 lb 9.6 oz (134.5 kg)  11/17/22 299 lb (135.6 kg)  09/26/22 285 lb (129.3 kg)   SpO2 Readings from Last 3 Encounters:  02/01/23 96%  11/17/22 99%  09/26/22 99%    Physical Exam Vitals and nursing note reviewed.  Constitutional:      General: She is not in acute distress.    Appearance: Normal appearance. She is well-developed.  HENT:     Head: Normocephalic and atraumatic.     Right Ear: Tympanic membrane normal.     Left Ear: Tympanic membrane normal.     Nose: Congestion and rhinorrhea present.     Mouth/Throat:     Pharynx: Posterior oropharyngeal erythema present. No oropharyngeal exudate.  Eyes:     General: No scleral icterus.       Right eye: No discharge.        Left eye: No discharge.  Cardiovascular:     Rate and Rhythm: Normal rate and regular rhythm.     Heart sounds: No murmur heard. Pulmonary:     Effort: Pulmonary effort is normal. No respiratory distress.     Breath sounds: Normal breath  sounds.  Musculoskeletal:        General: Normal range of motion.     Cervical back: Normal range of motion and neck supple.     Right lower leg: No edema.     Left lower leg: No edema.  Skin:    General: Skin is warm and dry.  Neurological:     General: No focal deficit present.      Mental Status: She is alert and oriented to person, place, and time.  Psychiatric:        Mood and Affect: Mood normal.        Behavior: Behavior normal.        Thought Content: Thought content normal.        Judgment: Judgment normal.      Results for orders placed or performed in visit on 02/01/23  POC COVID-19  Result Value Ref Range   SARS Coronavirus 2 Ag Negative Negative  POCT rapid strep A  Result Value Ref Range   Rapid Strep A Screen Negative Negative    Last CBC Lab Results  Component Value Date   WBC 5.6 11/17/2022   HGB 14.6 11/17/2022   HCT 42.2 11/17/2022   MCV 86.0 11/17/2022   MCH 28.5 08/20/2015   RDW 12.7 11/17/2022   PLT 231.0 11/17/2022   Last metabolic panel Lab Results  Component Value Date   GLUCOSE 96 11/17/2022   NA 136 11/17/2022   K 4.6 11/17/2022   CL 99 11/17/2022   CO2 30 11/17/2022   BUN 13 11/17/2022   CREATININE 0.68 11/17/2022   GFR 101.26 11/17/2022   CALCIUM 9.1 11/17/2022   PROT 7.0 11/17/2022   ALBUMIN 4.1 11/17/2022   BILITOT 0.5 11/17/2022   ALKPHOS 76 11/17/2022   AST 14 11/17/2022   ALT 19 11/17/2022   Last lipids Lab Results  Component Value Date   CHOL 184 11/17/2022   HDL 84.30 11/17/2022   LDLCALC 91 11/17/2022   TRIG 43.0 11/17/2022   CHOLHDL 2 11/17/2022   Last hemoglobin A1c Lab Results  Component Value Date   HGBA1C 5.3 11/17/2022   Last thyroid functions Lab Results  Component Value Date   TSH 2.21 11/17/2022   Last vitamin D Lab Results  Component Value Date   VD25OH 29.55 (L) 11/17/2022   Last vitamin B12 and Folate Lab Results  Component Value Date   VITAMINB12 766 08/11/2010   FOLATE 18.0 08/11/2010      The 10-year ASCVD risk score (Arnett DK, et al., 2019) is: 0.8%    Assessment & Plan:   Problem List Items Addressed This Visit       Unprioritized   Nausea   Relevant Medications   ondansetron (ZOFRAN-ODT) 8 MG disintegrating tablet   Other Relevant Orders   POC  COVID-19 (Completed)   POCT rapid strep A (Completed)   Muscle spasm   Relevant Medications   Vitamin D, Ergocalciferol, (DRISDOL) 1.25 MG (50000 UNIT) CAPS capsule   ondansetron (ZOFRAN-ODT) 8 MG disintegrating tablet   tiZANidine (ZANAFLEX) 4 MG tablet   Fibromyalgia - Primary   Relevant Medications   tiZANidine (ZANAFLEX) 4 MG tablet   Other Relevant Orders   CBC with Differential/Platelet   CMV abs, IgG+IgM (cytomegalovirus)   Comprehensive metabolic panel   TSH   Mononucleosis screen   VITAMIN D 25 Hydroxy (Vit-D Deficiency, Fractures)   Vitamin B12   Epstein-Barr virus VCA antibody panel   Pharyngitis    Amoxicillin bid x 10 days  Cough syrup as needed  Covid and flu negative       Relevant Medications   amoxicillin (AMOXIL) 875 MG tablet   Other Relevant Orders   CBC with Differential/Platelet   CMV abs, IgG+IgM (cytomegalovirus)   Comprehensive metabolic panel   TSH   Mononucleosis screen   VITAMIN D 25 Hydroxy (Vit-D Deficiency, Fractures)   Vitamin B12   Epstein-Barr virus VCA antibody panel   POC COVID-19 (Completed)   POCT rapid strep A (Completed)   Myalgia   Relevant Orders   CBC with Differential/Platelet   CMV abs, IgG+IgM (cytomegalovirus)   Comprehensive metabolic panel   TSH   Mononucleosis screen   VITAMIN D 25 Hydroxy (Vit-D Deficiency, Fractures)   Vitamin B12   Epstein-Barr virus VCA antibody panel   POCT rapid strep A (Completed)   Acute cough   Relevant Medications   promethazine-dextromethorphan (PROMETHAZINE-DM) 6.25-15 MG/5ML syrup    No follow-ups on file.    [name], DO

## 2023-02-02 LAB — DRUG MONITORING PANEL 376104, URINE: Desmethyltramadol: NEGATIVE ng/mL (ref <100)

## 2023-02-02 LAB — DM TEMPLATE

## 2023-02-03 ENCOUNTER — Encounter: Payer: Self-pay | Admitting: Family Medicine

## 2023-02-04 LAB — DRUG MONITORING PANEL 376104, URINE
Amphetamine: 773 ng/mL — ABNORMAL HIGH (ref <250)
Amphetamines: POSITIVE ng/mL — AB (ref <500)
Barbiturates: NEGATIVE ng/mL (ref <300)
Benzodiazepines: NEGATIVE ng/mL (ref <100)
Cocaine Metabolite: NEGATIVE ng/mL (ref <150)
Desmethyltramadol: NEGATIVE ng/mL (ref <100)
Methamphetamine: NEGATIVE ng/mL (ref <250)
Opiates: NEGATIVE ng/mL (ref <100)
Oxycodone: NEGATIVE ng/mL (ref <100)
Tramadol: NEGATIVE ng/mL (ref <100)

## 2023-02-05 ENCOUNTER — Encounter: Payer: Self-pay | Admitting: Family Medicine

## 2023-02-06 ENCOUNTER — Ambulatory Visit: Payer: BC Managed Care – PPO | Admitting: Family Medicine

## 2023-02-06 ENCOUNTER — Encounter: Payer: Self-pay | Admitting: Family Medicine

## 2023-02-06 VITALS — BP 130/90 | HR 83 | Temp 98.4°F | Resp 18 | Ht 67.0 in | Wt 290.8 lb

## 2023-02-06 DIAGNOSIS — R197 Diarrhea, unspecified: Secondary | ICD-10-CM

## 2023-02-06 DIAGNOSIS — M791 Myalgia, unspecified site: Secondary | ICD-10-CM

## 2023-02-06 DIAGNOSIS — J0140 Acute pansinusitis, unspecified: Secondary | ICD-10-CM

## 2023-02-06 DIAGNOSIS — M797 Fibromyalgia: Secondary | ICD-10-CM

## 2023-02-06 DIAGNOSIS — J324 Chronic pansinusitis: Secondary | ICD-10-CM

## 2023-02-06 DIAGNOSIS — J029 Acute pharyngitis, unspecified: Secondary | ICD-10-CM | POA: Diagnosis not present

## 2023-02-06 LAB — COMPREHENSIVE METABOLIC PANEL
ALT: 15 U/L (ref 0–35)
AST: 14 U/L (ref 0–37)
Albumin: 4.4 g/dL (ref 3.5–5.2)
Alkaline Phosphatase: 63 U/L (ref 39–117)
BUN: 18 mg/dL (ref 6–23)
CO2: 28 mEq/L (ref 19–32)
Calcium: 9.8 mg/dL (ref 8.4–10.5)
Chloride: 100 mEq/L (ref 96–112)
Creatinine, Ser: 0.69 mg/dL (ref 0.40–1.20)
GFR: 100.75 mL/min (ref >60.00)
Glucose, Bld: 92 mg/dL (ref 70–99)
Potassium: 3.7 mEq/L (ref 3.5–5.1)
Sodium: 137 mEq/L (ref 135–145)
Total Bilirubin: 0.6 mg/dL (ref 0.2–1.2)
Total Protein: 7.2 g/dL (ref 6.0–8.3)

## 2023-02-06 LAB — CBC WITH DIFFERENTIAL/PLATELET
Basophils Absolute: 0 10*3/uL (ref 0.0–0.1)
Basophils Relative: 0.8 % (ref 0.0–3.0)
Eosinophils Absolute: 0.1 10*3/uL (ref 0.0–0.7)
Eosinophils Relative: 2.5 % (ref 0.0–5.0)
HCT: 43.5 % (ref 36.0–46.0)
Hemoglobin: 14.9 g/dL (ref 12.0–15.0)
Lymphocytes Relative: 38.6 % (ref 12.0–46.0)
Lymphs Abs: 1.8 10*3/uL (ref 0.7–4.0)
MCHC: 34.2 g/dL (ref 30.0–36.0)
MCV: 83.6 fl (ref 78.0–100.0)
Monocytes Absolute: 0.4 10*3/uL (ref 0.1–1.0)
Monocytes Relative: 8.4 % (ref 3.0–12.0)
Neutro Abs: 2.3 10*3/uL (ref 1.4–7.7)
Neutrophils Relative %: 49.7 % (ref 43.0–77.0)
Platelets: 256 10*3/uL (ref 150.0–400.0)
RBC: 5.21 Mil/uL — ABNORMAL HIGH (ref 3.87–5.11)
RDW: 12.8 % (ref 11.5–15.5)
WBC: 4.6 10*3/uL (ref 4.0–10.5)

## 2023-02-06 LAB — VITAMIN B12: Vitamin B-12: 921 pg/mL — ABNORMAL HIGH (ref 211–911)

## 2023-02-06 LAB — MONONUCLEOSIS SCREEN: Mono Screen: NEGATIVE

## 2023-02-06 LAB — VITAMIN D 25 HYDROXY (VIT D DEFICIENCY, FRACTURES): VITD: 34.25 ng/mL (ref 30.00–100.00)

## 2023-02-06 LAB — TSH: TSH: 4.35 u[IU]/mL (ref 0.35–5.50)

## 2023-02-06 MED ORDER — DOXYCYCLINE HYCLATE 100 MG PO TABS: 100.0000 mg | ORAL_TABLET | Freq: Two times a day (BID) | ORAL | 0 refills | Status: DC

## 2023-02-06 MED ORDER — METHYLPREDNISOLONE ACETATE 80 MG/ML IJ SUSP
80.0000 mg | Freq: Once | INTRAMUSCULAR | Status: AC
Start: 2023-02-06 — End: 2023-02-06
  Administered 2023-02-06: 80 mg via INTRAMUSCULAR

## 2023-02-06 NOTE — Progress Notes (Signed)
Established Patient Office Visit  Subjective   Patient ID: Jo Jackson, female    DOB: 1971-07-01  Age: 52 y.o. MRN: [id number]  Chief Complaint  Patient presents with   Sinus Problem    HPI Discussed the use of AI scribe software for clinical note transcription with the patient, who gave verbal consent to proceed.  History of Present Illness   The patient presents with ongoing head pressure and diarrhea. They describe the diarrhea as more like leakage, not a large volume, but frequent enough to keep them in the bathroom. They have tried loperamide without success. They have also tried modifying their diet, reducing fat intake and increasing protein, but this has not alleviated the symptoms. They mention that a family member had similar symptoms, suggesting a possible infectious cause.  In addition to these symptoms, the patient has been on amoxicillin for five days, which they believe may be contributing to the diarrhea. They also mention using nasal sprays for an unspecified condition. They have been experiencing coughing fits, particularly during sleep. They also mention having inhalers, suggesting a possible respiratory condition such as asthma or COPD.  They have been experiencing some sleep disturbances due to coughing.            Review of Systems  Constitutional:  Negative for chills and fever.  HENT:  Positive for congestion, ear pain and sinus pain. Negative for sore throat.   Eyes:  Negative for blurred vision.  Respiratory:  Negative for cough.   Cardiovascular:  Negative for chest pain and palpitations.  Gastrointestinal:  Negative for vomiting.  Musculoskeletal:  Negative for back pain.  Skin:  Negative for rash.  Neurological:  Negative for loss of consciousness and headaches.      Objective:     BP (!) 130/90 (BP Location: Left Arm, Patient Position: Sitting, Cuff Size: Large)   Pulse 83   Temp 98.4 F (36.9 C) (Oral)   Resp 18   Ht 5\' 7"  (1.702  m)   Wt 290 lb 12.8 oz (131.9 kg)   SpO2 95%   BMI 45.55 kg/m    Physical Exam Vitals and nursing note reviewed.  Constitutional:      General: She is not in acute distress.    Appearance: Normal appearance.  HENT:     Head: Normocephalic and atraumatic.     Right Ear: Tympanic membrane, ear canal and external ear normal. There is no impacted cerumen.     Left Ear: Tympanic membrane, ear canal and external ear normal. There is no impacted cerumen.     Nose: No congestion or rhinorrhea.     Right Sinus: Maxillary sinus tenderness and frontal sinus tenderness present.     Left Sinus: Maxillary sinus tenderness and frontal sinus tenderness present.     Mouth/Throat:     Mouth: Mucous membranes are moist.     Pharynx: Oropharynx is clear. No oropharyngeal exudate or posterior oropharyngeal erythema.  Eyes:     General: No scleral icterus.       Right eye: No discharge.        Left eye: No discharge.     Conjunctiva/sclera: Conjunctivae normal.  Cardiovascular:     Rate and Rhythm: Normal rate and regular rhythm.     Heart sounds: Normal heart sounds.  Pulmonary:     Effort: Pulmonary effort is normal. No respiratory distress.     Breath sounds: Normal breath sounds.  Musculoskeletal:     Cervical back: Normal range of  motion.  Lymphadenopathy:     Cervical: No cervical adenopathy.  Skin:    General: Skin is warm and dry.  Neurological:     Mental Status: She is alert and oriented to person, place, and time.  Psychiatric:        Mood and Affect: Mood normal.        Behavior: Behavior normal.        Thought Content: Thought content normal.        Judgment: Judgment normal.      No results found for any visits on 02/06/23.    The 10-year ASCVD risk score (Arnett DK, et al., 2019) is: 0.8%    Assessment & Plan:   Problem List Items Addressed This Visit       Unprioritized   SINUSITIS - ACUTE-NOS - Primary   Relevant Medications   doxycycline (VIBRA-TABS) 100  MG tablet   Fibromyalgia   Pharyngitis   Myalgia   Pansinusitis    Con't flonase Abx changed d/c amoxicilln Depomedrol 80 mg I'm given       Relevant Medications   doxycycline (VIBRA-TABS) 100 MG tablet   Other Visit Diagnoses     Diarrhea, unspecified type       Relevant Orders   C. difficile GDH and Toxin A/B   Stool culture       No follow-ups on file.    [name], DO

## 2023-02-06 NOTE — Assessment & Plan Note (Signed)
Con't flonase Abx changed d/c amoxicilln Depomedrol 80 mg I'm given

## 2023-02-07 ENCOUNTER — Encounter: Payer: Self-pay | Admitting: Family Medicine

## 2023-02-08 LAB — EPSTEIN-BARR VIRUS VCA ANTIBODY PANEL
EBV NA IgG: >600 U/mL — ABNORMAL HIGH
EBV VCA IgG: 708 U/mL — ABNORMAL HIGH
EBV VCA IgM: <36 U/mL

## 2023-02-08 LAB — CMV ABS, IGG+IGM (CYTOMEGALOVIRUS)
CMV IgM: <30 AU/mL
Cytomegalovirus Ab-IgG: <0.6 U/mL

## 2023-02-09 ENCOUNTER — Encounter: Payer: Self-pay | Admitting: Family Medicine

## 2023-02-11 ENCOUNTER — Encounter: Payer: Self-pay | Admitting: Family Medicine

## 2023-02-26 NOTE — Telephone Encounter (Signed)
Pt dropped off FMLA paperwork to be filled out by provider. (The [employer] documents- all in a clear transparent folder) Pt would like document to be faxed to Fax # [phone] and have a copy for pt to pick up on her appt on March 02, 2023. Document put at front office tray under providers name.

## 2023-03-02 ENCOUNTER — Encounter: Payer: Self-pay | Admitting: Family Medicine

## 2023-03-02 ENCOUNTER — Ambulatory Visit: Payer: BC Managed Care – PPO | Admitting: Family Medicine

## 2023-03-02 VITALS — BP 132/69 | HR 98 | Temp 98.2°F | Resp 16 | Wt 282.0 lb

## 2023-03-02 DIAGNOSIS — R197 Diarrhea, unspecified: Secondary | ICD-10-CM

## 2023-03-02 DIAGNOSIS — M797 Fibromyalgia: Secondary | ICD-10-CM | POA: Diagnosis not present

## 2023-03-02 DIAGNOSIS — G43009 Migraine without aura, not intractable, without status migrainosus: Secondary | ICD-10-CM | POA: Diagnosis not present

## 2023-03-02 NOTE — Progress Notes (Signed)
Established Patient Office Visit  Subjective   Patient ID: Jo Jackson, female    DOB: 10/31/1970  Age: 52 y.o. MRN: [id number]  No chief complaint on file.   HPI Discussed the use of AI scribe software for clinical note transcription with the patient, who gave verbal consent to proceed.  History of Present Illness   The patient presents with a variety of health issues. Initially, the patient had gastrointestinal problems, but these have resolved without the need for a stool sample. The patient is currently following a carnivore diet and consuming a lot of electrolytes, which seems to be helping with her overall health.  The patient is experiencing a flare-up of fibromyalgia, which has been ongoing and has resulted in severe migraines. The patient describes the pain as if she has been "kicked" by it. The patient also mentions a cortisone shot in the left shoulder, which was administered over a week ago but has not provided relief. The patient has ordered hand weights to start rehabilitating the shoulder.  The patient also mentions a herniated disc in the neck, which is more to the left than on the right. However, the symptoms have been predominantly on the right side. The patient is considering chiropractic treatment to help with the pain.  The patient has lost weight since starting the carnivore diet and feels less bloated. She is still taking famotidine every couple of days.      Patient Active Problem List   Diagnosis Date Noted   Pharyngitis 02/01/2023   Myalgia 02/01/2023   Acute cough 02/01/2023   Hot flashes due to menopause 11/17/2022   Preventative health care 04/03/2022   Skin tag 11/03/2021   Inflamed skin tag 11/03/2021   Morbid obesity (HCC) 09/27/2021   Migraine 12/21/2020   Muscle spasm 12/21/2020   Nausea 12/21/2020   Primary hypertension 12/03/2020   Dizzy 08/18/2020   Adult ADHD 06/04/2019   Social anxiety disorder 06/04/2019   Influenza 04/15/2018    Recurrent acute serous otitis media of right ear 03/22/2018   Vaginal irritation 03/22/2018   Pansinusitis 10/12/2017   Viral URI 05/28/2017   Skin tag of perianal region 11/20/2016   Rectal bleeding 08/20/2015   Bronchitis 06/04/2015   Sensation of fullness in right ear 04/07/2015   Cervicogenic headache 11/06/2014   Obesity (BMI 30-39.9) 06/24/2013   Morbid obesity with BMI of 40.0-44.9, adult (HCC) 06/07/2013   SVD (spontaneous vaginal delivery) 12/15/2012    Class: Status post   HAIR LOSS 08/11/2010   FATIGUE 08/11/2010   SINUSITIS - ACUTE-NOS 06/29/2010   GERD 06/29/2010   PAIN IN THORACIC SPINE 05/12/2010   Migraine without aura 02/10/2010   ELEVATED BLOOD PRESSURE WITHOUT DIAGNOSIS OF HYPERTENSION 02/10/2010   AMENORRHEA 04/26/2009   GAD (generalized anxiety disorder) 03/26/2009   Major depressive disorder, recurrent episode, in full remission (HCC) 03/26/2009   ALLERGIC REACTION 08/26/2008   Fibromyalgia 03/05/2007   ARTHROSCOPY, LEFT KNEE, HX OF 03/05/2007   Headache 01/31/2007   HX, PERSONAL, TOBACCO USE 01/31/2007   Past Medical History:  Diagnosis Date   Abnormal Pap smear    Repeats WNL   ADD (attention deficit disorder)    Anxiety    Arthritis    Oseteoarthritis   Depression    GERD (gastroesophageal reflux disease)    Headache(784.0)    Migraines   Junctional escape rhythm    Neuromuscular disorder (HCC)    Fibromyalgia   Pregnancy induced hypertension    Supraventricular tachycardia    Past  Surgical History:  Procedure Laterality Date   ABLATION OF DYSRHYTHMIC FOCUS     APPENDECTOMY  1986   CARPAL TUNNEL RELEASE  2000   CERVICAL FUSION  08-24-99,04-26-00,05-09-01   Knee sugery Left    16 yrs. old   SHOULDER SURGERY  left-6-04, right 09-12-04   tongue polyp  1984   Social History   Tobacco Use   Smoking status: Former    Current packs/day: 0.00    Types: Cigarettes    Quit date: 10/23/2007    Years since quitting: 15.3   Smokeless tobacco:  Never  Vaping Use   Vaping status: Never Used  Substance Use Topics   Alcohol use: Yes    Alcohol/week: 0.0 standard drinks of alcohol    Comment: Occ   Drug use: No   Social History   Socioeconomic History   Marital status: Widowed    Spouse name: Not on file   Number of children: 2   Years of education: Not on file   Highest education level: Some college, no degree  Occupational History    Employer: [employer]    Comment: P/T   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    Employer: [employer]  Tobacco Use   Smoking status: Former    Current packs/day: 0.00    Types: Cigarettes    Quit date: 10/23/2007    Years since quitting: 15.3   Smokeless tobacco: Never  Vaping Use   Vaping status: Never Used  Substance and Sexual Activity   Alcohol use: Yes    Alcohol/week: 0.0 standard drinks of alcohol    Comment: Occ   Drug use: No   Sexual activity: Not Currently    Partners: Male  Other Topics Concern   Not on file  Social History Narrative   G2p2      Exercise- no      Right handed   Social Determinants of Health   Financial Resource Strain: Medium Risk (11/10/2022)   Overall Financial Resource Strain (CARDIA)    Difficulty of Paying Living Expenses: Somewhat hard  Food Insecurity: Food Insecurity Present (11/10/2022)   Hunger Vital Sign    Worried About Running Out of Food in the Last Year: Sometimes true    Ran Out of Food in the Last Year: Sometimes true  Transportation Needs: Unmet Transportation Needs (11/10/2022)   PRAPARE - Transportation    Lack of Transportation (Medical): Yes    Lack of Transportation (Non-Medical): Yes  Physical Activity: Insufficiently Active (11/10/2022)   Exercise Vital Sign    Days of Exercise per Week: 3 days    Minutes of Exercise per Session: 30 min  Stress: Stress Concern Present (11/10/2022)   [employer] of Occupational Health - Occupational Stress Questionnaire    Feeling of Stress : Very much  Social Connections: Moderately  Isolated (11/10/2022)   Social Connection and Isolation Panel [NHANES]    Frequency of Communication with Friends and Family: More than three times a week    Frequency of Social Gatherings with Friends and Family: More than three times a week    Attends Religious Services: Never    Database administrator or Organizations: Yes    Attends Banker Meetings: 1 to 4 times per year    Marital Status: Widowed  Intimate Partner Violence: Not on file   Family Status  Relation Name Status   Mother  Deceased at age 74   Father  Deceased at age 38   Brother  (Not Specified)  Pat Aunt  Alive   Other  (Not Specified)   Other  (Not Specified)   Other  (Not Specified)   Other  (Not Specified)   Other  (Not Specified)   Cousin  Deceased       breast  No partnership data on file   Family History  Problem Relation Age of Onset   Migraines Mother    COPD Mother    Heart disease Mother        chf   Depression Mother    Hodgkin's lymphoma Father    ADD / ADHD Brother    Diabetes Other    Thyroid disease Other    Hypertension Other    Depression Other    Bipolar disorder Other    Cancer Cousin    Allergies  Allergen Reactions   Ketorolac Tromethamine Hives   Morphine Other (See Comments)    "makes me the devil."   Seasonal Ic [Octacosanol]    Latex Rash and Other (See Comments)    Latex catheter caused extreme irritation      Review of Systems  Constitutional:  Negative for fever and malaise/fatigue.  HENT:  Negative for congestion.   Eyes:  Negative for blurred vision.  Respiratory:  Negative for cough and shortness of breath.   Cardiovascular:  Negative for chest pain, palpitations and leg swelling.  Gastrointestinal:  Negative for abdominal pain, blood in stool, nausea and vomiting.  Genitourinary:  Negative for dysuria and frequency.  Musculoskeletal:  Positive for joint pain and neck pain. Negative for back pain and falls.  Skin:  Negative for rash.   Neurological:  Positive for headaches. Negative for dizziness and loss of consciousness.  Endo/Heme/Allergies:  Negative for environmental allergies.  Psychiatric/Behavioral:  Negative for depression. The patient is not nervous/anxious.       Objective:     BP 132/69 (BP Location: Right Arm, Patient Position: Sitting, Cuff Size: Large)   Pulse 98   Temp 98.2 F (36.8 C)   Resp 16   Wt 282 lb (127.9 kg)   SpO2 96%   BMI 44.17 kg/m  BP Readings from Last 3 Encounters:  03/02/23 132/69  02/06/23 (!) 130/90  02/01/23 138/80   Wt Readings from Last 3 Encounters:  03/02/23 282 lb (127.9 kg)  02/06/23 290 lb 12.8 oz (131.9 kg)  02/01/23 296 lb 9.6 oz (134.5 kg)   SpO2 Readings from Last 3 Encounters:  03/02/23 96%  02/06/23 95%  02/01/23 96%      Physical Exam Vitals and nursing note reviewed.  Constitutional:      General: She is not in acute distress.    Appearance: Normal appearance. She is well-developed.  HENT:     Head: Normocephalic and atraumatic.  Eyes:     General: No scleral icterus.       Right eye: No discharge.        Left eye: No discharge.  Cardiovascular:     Rate and Rhythm: Normal rate and regular rhythm.     Heart sounds: No murmur heard. Pulmonary:     Effort: Pulmonary effort is normal. No respiratory distress.     Breath sounds: Normal breath sounds.  Musculoskeletal:        General: Tenderness present. Normal range of motion.     Cervical back: Normal range of motion and neck supple.     Right lower leg: No edema.     Left lower leg: No edema.  Skin:    General:  Skin is warm and dry.  Neurological:     General: No focal deficit present.     Mental Status: She is alert and oriented to person, place, and time.  Psychiatric:        Mood and Affect: Mood normal.        Behavior: Behavior normal.        Thought Content: Thought content normal.        Judgment: Judgment normal.      No results found for any visits on  03/02/23.  Last CBC Lab Results  Component Value Date   WBC 4.6 02/06/2023   HGB 14.9 02/06/2023   HCT 43.5 02/06/2023   MCV 83.6 02/06/2023   MCH 28.5 08/20/2015   RDW 12.8 02/06/2023   PLT 256.0 02/06/2023   Last metabolic panel Lab Results  Component Value Date   GLUCOSE 92 02/06/2023   NA 137 02/06/2023   K 3.7 02/06/2023   CL 100 02/06/2023   CO2 28 02/06/2023   BUN 18 02/06/2023   CREATININE 0.69 02/06/2023   GFR 100.75 02/06/2023   CALCIUM 9.8 02/06/2023   PROT 7.2 02/06/2023   ALBUMIN 4.4 02/06/2023   BILITOT 0.6 02/06/2023   ALKPHOS 63 02/06/2023   AST 14 02/06/2023   ALT 15 02/06/2023   Last lipids Lab Results  Component Value Date   CHOL 184 11/17/2022   HDL 84.30 11/17/2022   LDLCALC 91 11/17/2022   TRIG 43.0 11/17/2022   CHOLHDL 2 11/17/2022   Last hemoglobin A1c Lab Results  Component Value Date   HGBA1C 5.3 11/17/2022   Last thyroid functions Lab Results  Component Value Date   TSH 4.35 02/06/2023   Last vitamin D Lab Results  Component Value Date   VD25OH 34.25 02/06/2023   Last vitamin B12 and Folate Lab Results  Component Value Date   VITAMINB12 921 (H) 02/06/2023   FOLATE 18.0 08/11/2010      The 10-year ASCVD risk score (Arnett DK, et al., 2019) is: 0.8%    Assessment & Plan:   Problem List Items Addressed This Visit       Unprioritized   Migraine    Per neuro      Fibromyalgia    Fmla paperwork filled out for pt       Other Visit Diagnoses     Diarrhea, unspecified type    -  Primary      Assessment and Plan    Fibromyalgia Continuous flare with severe pain, affecting work performance. Previous cortisone shot in left shoulder not effective. -Continue current fibromyalgia management. -Consider physical therapy or chiropractic treatment for muscle tension and pain relief. -Plan to discuss potential trigger point injection with specialist.  Shoulder Pain Persistent pain in both shoulders, possibly  related to work activities. Previous cortisone shot in left shoulder not effective. -Plan to start rehab exercises with hand weights. -Consider discussing potential rotator cuff injury with specialist.  Migraines Recent severe episode related to fibromyalgia flare. -Continue current migraine management. -See neurologist on 23rd August.  Weight Loss Significant weight loss noted since last visit, possibly related to dietary changes (carnivore diet). -Continue current diet if well tolerated and beneficial for overall health.  Short Term Disability FMLA paperwork completed for work absence due to fibromyalgia flare and shoulder pain. -Submit paperwork and await response. -Plan for potential work absence from July 8th to August 26th if shoulder pain does not improve with rehab exercises.       No follow-ups on  file.    [name], DO

## 2023-03-02 NOTE — Assessment & Plan Note (Signed)
Per neuro 

## 2023-03-02 NOTE — Assessment & Plan Note (Signed)
Fmla paperwork filled out for pt

## 2023-03-13 ENCOUNTER — Other Ambulatory Visit: Payer: Self-pay | Admitting: Family Medicine

## 2023-03-13 DIAGNOSIS — R110 Nausea: Secondary | ICD-10-CM

## 2023-03-13 DIAGNOSIS — M62838 Other muscle spasm: Secondary | ICD-10-CM

## 2023-03-14 ENCOUNTER — Encounter: Payer: Self-pay | Admitting: Family Medicine

## 2023-03-15 NOTE — Progress Notes (Signed)
NEUROLOGY FOLLOW UP OFFICE NOTE  Tyrielle Vallas [id number]  Assessment/Plan:   1  Migraine without aura, without status migrainosus, not intractable 2  Cervicogenic headache    Migraine prevention:  Botox Migraine rescue:  Eletriptan 40mg ;  Zofran for nausea. Limit use of pain relievers to no more than 2 days out of week to prevent risk of rebound or medication-overuse headache. Keep headache diary Follow up for Botox Follow up one year for routine checkup (or as needed)       Subjective:  Jo Jackson is a 52 year old right-handed female with asthma, ADD, depression and migraine who for migraines  UPDATE: Typical migraines: Duration:  less than an hour with eletriptan Frequency:  1 to 2 every 2 months.    Cervicogenic migraines: Wakes up with it (right occipital region radiating up to the front of head) Duration:  less than an hour with eletriptan Frequency:  2-4 times a week pending weather.    Current abortive management:  eletriptan Current NSAIDS/analgesics:  hydrocodone-acetaminophen, tramadol Current triptans:  eletriptan 40mg  Current ergotamine:  none Current anti-emetic:  Zofran ODT 8mg  Current muscle relaxants:  tizanidine 4mg  PRN Current Antihypertensive medications:  none Current Antidepressant medications:  Trintellix Current Anticonvulsant medications:  none Current anti-CGRP:  none Current Vitamins/Herbal/Supplements:  none Current Antihistamines/Decongestants:  meclizine Other therapy:  none Hormone/birth control:  none Other medications:  Adderall, albuterol    Caffeine:  Sweet tea Alcohol:  occasionally Smoker:  no Diet:  Needs to hydrate more Exercise:  Not routine Depression/stress:  better Sleep hygiene:  varies Other pain:  fibromyalgia. Family history of headache:  mom  HISTORY:  Onset:  She has had menstrually-related migraines since age 52, but they have been related to the neck since repeated cervical spinal fusions  at C6-C7 over 10 years ago, as demonstrated on CT report from 2003. Location:  She develops spasm in the right trapezius which triggers pain in the back of head on the right and radiates to the right temple/eye Quality:  Squeezing, throbbing Intensity:  7/10 Aura:  no Prodrome:  no Associated symptoms:  Both eyes hurt with or without headache.  Photophobia, phonophobia.  Occasional nausea.  No visual disturbance. Duration:  1 hour with medication but can last 2-3 days approximately 1 to 2 times a month Frequency:  During summer months, 5 days a week.  Otherwise, 3 a month Triggers/exacerbating factors:  Previously, it was her period (until she started taking Depo shot), now neck spasms Relieving factors:  Rest, tizanidine Activity:  functions   History of cervical fusion.  08/02/2001 CT C-SPINE:  1.  ANTERIOR AND POSTERIOR FUSION AT C6-7 WITH FRACTURE OF THE SCREWS AT C6.  NO CHANGE IN ALIGNMENT WITH APPARENT FUSION AT THE LEVEL OF THE INTERBODY FUSION PLUG. 2.  QUESTIONABLE BULGING ANNULUS AND/OR HERNIATED DISC CENTRALLY  AND TO THE LEFT AT THE C7-T1 LEVEL.  IF THE PRIOR STUDIES CANNOT BE LOCATED FOR COMPARISON,  THEN CT MYELOGRAPHY MAY BE  INDICATED TO ASSESS THIS AREA FURTHER. CT MULTIPLANAR RECONSTRUCTION.  THE AXIAL SCANS WERE RECONSTRUCTED IN BOTH SAGITTAL AND CORONAL PLAINS.  THERE DOES APPEAR TO BE  GOOD BONY FUSION AT THE LEVEL OF THE INTERBODY FUSION PLUG AT C6-7, BUT THE REMAINDER OF THE INTERVERTEBRAL SPACE DOES NOT APPEAR TO BE SOLIDLY FUSED.  NORMAL ALIGNMENT IS MAINTAINED IN THE  SAGITALLY RECONSTRUCTED IMAGES.  09/28/2016 MRI C-SPINE:  1.  Anterior and posterior fusion at C6-7 appears solid with no complication or residual canal stenosis  in this region. 2.  Multilevel disc and facet degenerative change in the upper/mid cervical spine as detailed. Pertinent levels below. 3.  At C3-4 there is mild to moderate canal stenosis with high-grade right greater than left foraminal stenosis. 4.   At C4-5 there is moderate/severe right foraminal stenosis.    Past NSAIDS/analgesics:  meloxicam, Tylenol, ibuprofen 800mg  Past abortive triptans:  sumatriptan Harmon/NS, rizatriptan Past abortive ergotamine:  possibly Past muscle relaxants:  cyclobenzaprine, methocarbamol, Soma Past anti-emetic:  none Past antihypertensive medications:  propranolol, metoprolol Past antidepressant medications:  venlafaxine, duloxetine, Viibryd Past anticonvulsant medications:  topiramate Past anti-CGRP:  Bernita Raisin Past vitamins/Herbal/Supplements:  none Past antihistamines/decongestants:  Nasonex Other past therapies:  cervical epidural injections    PAST MEDICAL HISTORY: Past Medical History:  Diagnosis Date   Abnormal Pap smear    Repeats WNL   ADD (attention deficit disorder)    Anxiety    Arthritis    Oseteoarthritis   Depression    GERD (gastroesophageal reflux disease)    Headache(784.0)    Migraines   Junctional escape rhythm    Neuromuscular disorder (HCC)    Fibromyalgia   Pregnancy induced hypertension    Supraventricular tachycardia     MEDICATIONS: Current Outpatient Medications on File Prior to Visit  Medication Sig Dispense Refill   albuterol (PROVENTIL) (2.5 MG/3ML) 0.083% nebulizer solution Take 3 mLs (2.5 mg total) by nebulization every 6 (six) hours as needed for wheezing or shortness of breath. 75 mL 12   albuterol (VENTOLIN HFA) 108 (90 Base) MCG/ACT inhaler Inhale 2 puffs into the lungs every 6 (six) hours as needed for wheezing or shortness of breath. 18 g 5   amphetamine-dextroamphetamine (ADDERALL) 30 MG tablet Take 1 tablet by mouth 2 (two) times daily. 180 tablet 0   Azelastine HCl 137 MCG/SPRAY SOLN Place into both nostrils.     benzonatate (TESSALON) 200 MG capsule Take 1 capsule (200 mg total) by mouth 3 (three) times daily as needed for cough. 90 capsule 0   botulinum toxin Type A (BOTOX) 200 units injection Inject 200 Units into the muscle every 3 (three) months.  Inject 155 units IM into multiple site in the face,neck and head once every 90 days 1 each 4   buPROPion (WELLBUTRIN XL) 300 MG 24 hr tablet Take 1 tablet (300 mg total) by mouth daily. 30 tablet 2   citalopram (CELEXA) 40 MG tablet Take 1 tablet (40 mg total) by mouth daily. 30 tablet 3   eletriptan (RELPAX) 40 MG tablet TAKE 1 TABLET BY MOUTH AS NEEDED FOR MIGRAINE OR HEADACHE. MAY REPEAT IN 2 HOURS IF HEADACHE PERSISTS OR RECURS. MAXIMUM 2 TABLETS IN 24 HOURS. 10 tablet 5   gabapentin (NEURONTIN) 100 MG capsule Take 1 capsule (100 mg total) by mouth 2 (two) times daily. 60 capsule 3   HYDROcodone-acetaminophen (NORCO) 10-325 MG tablet Take 1 tablet by mouth every 6 (six) hours as needed for severe pain. 30 tablet 0   meclizine (ANTIVERT) 25 MG tablet TAKE 1 TABLET BY MOUTH 3 TIMES DAILY AS NEEDED FOR DIZZINESS. 30 tablet 0   meloxicam (MOBIC) 7.5 MG tablet TAKE 1 TO 2 TABLETS DAILY AS NEEDED FOR PAIN 180 tablet 2   mometasone (NASONEX) 50 MCG/ACT nasal spray PLACE 2 SPRAYS INTO THE NOSE DAILY. 51 each 4   norethindrone (INCASSIA) 0.35 MG tablet Take 1 tablet (0.35 mg total) by mouth daily. 28 tablet 11   ondansetron (ZOFRAN-ODT) 8 MG disintegrating tablet TAKE 1 TABLET BY MOUTH EVERY  8 HOURS AS NEEDED FOR NAUSEA OR VOMITING. 20 tablet 1   promethazine-dextromethorphan (PROMETHAZINE-DM) 6.25-15 MG/5ML syrup Take 5 mLs by mouth 4 (four) times daily as needed. 118 mL 0   tirzepatide (ZEPBOUND) 2.5 MG/0.5ML Pen Inject 2.5 mg into the skin once a week. 0.5 mL 0   tiZANidine (ZANAFLEX) 4 MG tablet Take 1 tablet (4 mg total) by mouth every 6 (six) hours as needed for muscle spasms. 90 tablet 1   traMADol (ULTRAM) 50 MG tablet Take 1 tablet (50 mg total) by mouth every 8 (eight) hours as needed (mod pain). 30 tablet 0   TRELEGY ELLIPTA 200-62.5-25 MCG/ACT AEPB TAKE 1 PUFF BY MOUTH EVERY DAY 180 each 1   valACYclovir (VALTREX) 1000 MG tablet Take 1 tablet (1,000 mg total) by mouth 3 (three) times daily. 30  tablet 2   Vitamin D, Ergocalciferol, (DRISDOL) 1.25 MG (50000 UNIT) CAPS capsule Take 1 capsule (50,000 Units total) by mouth every 7 (seven) days. 12 capsule 1   No current facility-administered medications on file prior to visit.    ALLERGIES: Allergies  Allergen Reactions   Ketorolac Tromethamine Hives   Morphine Other (See Comments)    "makes me the devil."   Seasonal Ic [Octacosanol]    Latex Rash and Other (See Comments)    Latex catheter caused extreme irritation    FAMILY HISTORY: Family History  Problem Relation Age of Onset   Migraines Mother    COPD Mother    Heart disease Mother        chf   Depression Mother    Hodgkin's lymphoma Father    ADD / ADHD Brother    Diabetes Other    Thyroid disease Other    Hypertension Other    Depression Other    Bipolar disorder Other    Cancer Cousin       Objective:  Blood pressure 132/89, pulse 82, height 5\' 7"  (1.702 m), weight 280 lb (127 kg), SpO2 99%. General: No acute distress.  Patient appears well-groomed.   Head:  Normocephalic/atraumatic Neck:  Supple.  Right paraspinal tenderness.  Full range of motion. Heart:  Regular rate and rhythm. Neuro:  Alert and oriented.  Speech fluent and not dysarthric.  Language intact.  CN II-XII intact.  Bulk and tone normal.  Muscle strength 5/5 throughout.  Deep tendon reflexes 2+ throughout.  Gait normal.  Romberg negative.    Shon Millet, DO  CC: Seabron Spates, DO

## 2023-03-16 ENCOUNTER — Other Ambulatory Visit: Payer: Self-pay | Admitting: Family Medicine

## 2023-03-16 ENCOUNTER — Telehealth: Payer: Self-pay | Admitting: Family Medicine

## 2023-03-16 ENCOUNTER — Ambulatory Visit (INDEPENDENT_AMBULATORY_CARE_PROVIDER_SITE_OTHER): Payer: BC Managed Care – PPO | Admitting: Neurology

## 2023-03-16 ENCOUNTER — Encounter: Payer: Self-pay | Admitting: Neurology

## 2023-03-16 VITALS — BP 132/89 | HR 82 | Ht 67.0 in | Wt 280.0 lb

## 2023-03-16 DIAGNOSIS — G4486 Cervicogenic headache: Secondary | ICD-10-CM

## 2023-03-16 DIAGNOSIS — G43009 Migraine without aura, not intractable, without status migrainosus: Secondary | ICD-10-CM | POA: Diagnosis not present

## 2023-03-16 DIAGNOSIS — M62838 Other muscle spasm: Secondary | ICD-10-CM

## 2023-03-16 MED ORDER — ELETRIPTAN HYDROBROMIDE 40 MG PO TABS: ORAL_TABLET | ORAL | 11 refills | Status: DC

## 2023-03-16 NOTE — Telephone Encounter (Signed)
Noted  

## 2023-03-16 NOTE — Telephone Encounter (Signed)
Pt called & mentioned that since her last visit with Dr. Laury Axon, she noticed on her FMLA paperwork, that was filled out by Dr. Laury Axon during their last appointment, is missing some signatures from Dr. Laury Axon & wanted to know if she could drop the paperwork off today for completion & pick up by Monday. She stated that she needs to have the documents submitted by Monday 8:26. Please call & advise pt if her request can be accommodated.

## 2023-03-16 NOTE — Telephone Encounter (Signed)
Pt dropped off document to be filled out by provider-([employer] -[employer]) pt stated it was filled out before by provider but missing some areas, pt brought in document again. Pt would like document to be faxed at [phone] when ready and to have a copy for her to pick up. Document put at front office tray under providers name.

## 2023-03-16 NOTE — Telephone Encounter (Signed)
See other telephone note.  

## 2023-03-19 ENCOUNTER — Other Ambulatory Visit: Payer: Self-pay | Admitting: Family Medicine

## 2023-03-19 ENCOUNTER — Other Ambulatory Visit: Payer: Self-pay | Admitting: Pulmonary Disease

## 2023-03-19 DIAGNOSIS — B001 Herpesviral vesicular dermatitis: Secondary | ICD-10-CM

## 2023-03-19 DIAGNOSIS — J4540 Moderate persistent asthma, uncomplicated: Secondary | ICD-10-CM

## 2023-03-21 ENCOUNTER — Other Ambulatory Visit (HOSPITAL_COMMUNITY): Payer: Self-pay

## 2023-03-22 ENCOUNTER — Other Ambulatory Visit: Payer: Self-pay

## 2023-03-22 ENCOUNTER — Telehealth: Payer: Self-pay

## 2023-03-22 DIAGNOSIS — G4486 Cervicogenic headache: Secondary | ICD-10-CM

## 2023-03-22 DIAGNOSIS — M542 Cervicalgia: Secondary | ICD-10-CM

## 2023-03-22 NOTE — Telephone Encounter (Signed)
Per Dr.Jaffe, Response to patient mychart message, We can try ordering MRI of C-spine without contrast for ongoing neck pain and cervicogenic headache despite treatment  [employer] [phone]   $0 Member Cost Share for: MRI/CT/PET Scans   Submit claims scheduled through [employer]:   [id number]   [employer] [id number] [place], [place] [address]

## 2023-03-28 ENCOUNTER — Other Ambulatory Visit (HOSPITAL_COMMUNITY): Payer: Self-pay

## 2023-03-28 NOTE — Telephone Encounter (Signed)
Form faxed.  Pt made aware. Copies placed in scan. Original placed at the front for pick up

## 2023-03-30 ENCOUNTER — Other Ambulatory Visit: Payer: Self-pay | Admitting: Family Medicine

## 2023-03-30 DIAGNOSIS — J069 Acute upper respiratory infection, unspecified: Secondary | ICD-10-CM

## 2023-04-02 ENCOUNTER — Encounter: Payer: Self-pay | Admitting: Neurology

## 2023-04-02 ENCOUNTER — Encounter: Payer: Self-pay | Admitting: Family Medicine

## 2023-04-02 ENCOUNTER — Telehealth: Payer: Self-pay | Admitting: Family Medicine

## 2023-04-02 NOTE — Telephone Encounter (Signed)
Received. Placed in folder in Lowne's office.

## 2023-04-02 NOTE — Telephone Encounter (Signed)
Forms placed in the folder in your office

## 2023-04-02 NOTE — Telephone Encounter (Signed)
Pt dropped off document to be filled out by provider [facility] FORM 1 page) Pt would like to be called when document ready to pick up Tel [phone]. Document put at front office tray under providers name.

## 2023-04-06 ENCOUNTER — Ambulatory Visit: Payer: BC Managed Care – PPO | Admitting: Neurology

## 2023-04-13 ENCOUNTER — Telehealth: Payer: Self-pay | Admitting: Neurology

## 2023-04-13 NOTE — Telephone Encounter (Signed)
Mri order needs Dr. Lenox Ponds please fax back to [phone]

## 2023-04-14 ENCOUNTER — Encounter (HOSPITAL_COMMUNITY): Payer: Self-pay

## 2023-04-17 NOTE — Telephone Encounter (Signed)
Dr.Jaffe out of the office will have him sign when he returns.

## 2023-05-01 ENCOUNTER — Encounter: Payer: Self-pay | Admitting: Neurology

## 2023-05-04 ENCOUNTER — Ambulatory Visit: Payer: BC Managed Care – PPO | Admitting: Neurology

## 2023-07-13 ENCOUNTER — Ambulatory Visit: Payer: BC Managed Care – PPO | Admitting: Neurology

## 2023-07-19 ENCOUNTER — Other Ambulatory Visit: Payer: Self-pay | Admitting: Family Medicine

## 2023-07-19 DIAGNOSIS — G43809 Other migraine, not intractable, without status migrainosus: Secondary | ICD-10-CM

## 2023-07-19 NOTE — Telephone Encounter (Signed)
Requesting: tramadol Contract: 02/13/23 UDS:02/01/23 Last Visit:03/02/23 Next Visit:n/a Last Refill:12/29/22  Please Advise

## 2023-08-03 ENCOUNTER — Other Ambulatory Visit: Payer: Self-pay | Admitting: Neurology

## 2023-08-07 ENCOUNTER — Other Ambulatory Visit: Payer: Self-pay | Admitting: Family Medicine

## 2023-08-07 DIAGNOSIS — J111 Influenza due to unidentified influenza virus with other respiratory manifestations: Secondary | ICD-10-CM

## 2023-08-09 ENCOUNTER — Other Ambulatory Visit: Payer: Self-pay | Admitting: Family Medicine

## 2023-08-09 DIAGNOSIS — Z0000 Encounter for general adult medical examination without abnormal findings: Secondary | ICD-10-CM

## 2023-08-09 DIAGNOSIS — M797 Fibromyalgia: Secondary | ICD-10-CM

## 2023-08-10 ENCOUNTER — Telehealth: Payer: Self-pay

## 2023-08-10 ENCOUNTER — Ambulatory Visit: Payer: BC Managed Care – PPO | Admitting: Neurology

## 2023-08-10 MED ORDER — HYDROCODONE-ACETAMINOPHEN 10-325 MG PO TABS
1.0000 | ORAL_TABLET | Freq: Four times a day (QID) | ORAL | 0 refills | Status: DC | PRN
Start: 2023-08-10 — End: 2023-11-12

## 2023-08-10 MED ORDER — ELETRIPTAN HYDROBROMIDE 40 MG PO TABS: 40.0000 mg | ORAL_TABLET | ORAL | 5 refills | Status: DC | PRN

## 2023-08-10 NOTE — Telephone Encounter (Signed)
Requesting: hydrocodone  Contract: 02/01/23 UDS: 02/01/23 Last Visit: 03/02/23 Next Visit: None Last Refill: 12/29/22 #30 and 0RF   She received trmadol 50mg  on 07/19/23?  Please Advise

## 2023-08-10 NOTE — Addendum Note (Signed)
Addended by: Dimas Chyle on: 08/10/2023 08:12 AM   Modules accepted: Orders

## 2023-08-10 NOTE — Telephone Encounter (Signed)
PA initiated via Covermymeds; KEY:  [id number].  PA approved. Effective 08/10/2023 to 02/06/2024

## 2023-08-22 ENCOUNTER — Encounter: Payer: Self-pay | Admitting: Family Medicine

## 2023-08-27 ENCOUNTER — Encounter: Payer: Self-pay | Admitting: Neurology

## 2023-08-28 ENCOUNTER — Other Ambulatory Visit (HOSPITAL_COMMUNITY): Payer: Self-pay

## 2023-08-28 ENCOUNTER — Telehealth: Payer: Self-pay | Admitting: Pharmacy Technician

## 2023-08-28 NOTE — Telephone Encounter (Signed)
 Pharmacy Patient Advocate Encounter   Received notification from CoverMyMeds that prior authorization for BOTOX  200 is required/requested.   Insurance verification completed.   The patient is insured through [facility] .   Per test claim: PA required; PA submitted to above mentioned insurance via CoverMyMeds Key/confirmation #/EOC [id number] Status is pending  Also faxed: [phone]

## 2023-08-29 ENCOUNTER — Other Ambulatory Visit (HOSPITAL_COMMUNITY): Payer: Self-pay

## 2023-09-03 ENCOUNTER — Telehealth: Payer: Self-pay

## 2023-09-03 NOTE — Telephone Encounter (Signed)
 Received pre-op form from [facility] for Left reverse total shoulder arthoplasty. Date of surgery is TBD. Pt needs a pre-op appt please.

## 2023-09-04 ENCOUNTER — Other Ambulatory Visit: Payer: Self-pay | Admitting: Family Medicine

## 2023-09-04 DIAGNOSIS — M62838 Other muscle spasm: Secondary | ICD-10-CM

## 2023-09-10 ENCOUNTER — Ambulatory Visit: Payer: BC Managed Care – PPO | Admitting: Family Medicine

## 2023-09-11 ENCOUNTER — Ambulatory Visit: Payer: BC Managed Care – PPO | Admitting: Family Medicine

## 2023-09-11 VITALS — BP 144/78 | HR 98 | Resp 18 | Ht 67.0 in | Wt 318.0 lb

## 2023-09-11 DIAGNOSIS — Z01818 Encounter for other preprocedural examination: Secondary | ICD-10-CM | POA: Diagnosis not present

## 2023-09-11 DIAGNOSIS — I10 Essential (primary) hypertension: Secondary | ICD-10-CM

## 2023-09-11 NOTE — Progress Notes (Unsigned)
Subjective:    Jo Jackson is a 53 y.o. female who presents to the office today for a preoperative consultation at the request of surgeon Dr Ave Filter  who plans on performing Left reverse total shoulder arthroplasty on  tba   . Marland Kitchen This consultation is requested for the specific conditions prompting preoperative evaluation (i.e. because of potential affect on operative risk): RAD. Planned anesthesia: general. The patient has the following known anesthesia issues: past general anesthesia with complications (reactive airway disease). Patients bleeding risk: no recent abnormal bleeding. Patient does not have objections to receiving blood products if needed.  The following portions of the patient's history were reviewed and updated as appropriate: She  has a past medical history of Abnormal Pap smear, ADD (attention deficit disorder), Anxiety, Arthritis, Depression, GERD (gastroesophageal reflux disease), Headache(784.0), Junctional escape rhythm, Neuromuscular disorder (HCC), Pregnancy induced hypertension, and Supraventricular tachycardia (HCC). She does not have any pertinent problems on file. She  has a past surgical history that includes Appendectomy (1986); Carpal tunnel release (2000); Cervical fusion (08-24-99,04-26-00,05-09-01); Shoulder surgery (left-6-04, right 09-12-04); tongue polyp (1984); Ablation of dysrhythmic focus; and Knee sugery (Left). Her family history includes ADD / ADHD in her brother; Bipolar disorder in an other family member; Breast cancer in her sister; COPD in her mother; Cancer in her cousin; Depression in her mother and another family member; Diabetes in an other family member; Heart disease in her mother; Hodgkin's lymphoma in her father; Hypertension in an other family member; Migraines in her mother; Thyroid disease in an other family member. She  reports that she quit smoking about 15 years ago. Her smoking use included cigarettes. She has never used smokeless tobacco. She  reports current alcohol use. She reports that she does not use drugs. She has a current medication list which includes the following prescription(s): albuterol, albuterol, amphetamine-dextroamphetamine, botox, bupropion, citalopram, eletriptan, gabapentin, hydrocodone-acetaminophen, meclizine, meloxicam, mometasone, norethindrone, ondansetron, tizanidine, tramadol, trelegy ellipta, valacyclovir, and vitamin d (ergocalciferol). Current Outpatient Medications on File Prior to Visit  Medication Sig Dispense Refill   albuterol (VENTOLIN HFA) 108 (90 Base) MCG/ACT inhaler Inhale 2 puffs into the lungs every 6 (six) hours as needed for wheezing or shortness of breath. 18 g 1   albuterol (PROVENTIL) (2.5 MG/3ML) 0.083% nebulizer solution Take 3 mLs (2.5 mg total) by nebulization every 6 (six) hours as needed for wheezing or shortness of breath. 75 mL 12   amphetamine-dextroamphetamine (ADDERALL) 30 MG tablet Take 1 tablet by mouth 2 (two) times daily. 180 tablet 0   botulinum toxin Type A (BOTOX) 200 units injection Inject 200 Units into the muscle every 3 (three) months. Inject 155 units IM into multiple site in the face,neck and head once every 90 days 1 each 4   buPROPion (WELLBUTRIN XL) 300 MG 24 hr tablet Take 1 tablet (300 mg total) by mouth daily. 30 tablet 2   citalopram (CELEXA) 40 MG tablet Take 1 tablet (40 mg total) by mouth daily. 30 tablet 3   eletriptan (RELPAX) 40 MG tablet Take 1 tablet (40 mg total) by mouth as needed for migraine or headache. May repeat after 2 hours.  Maximum 2 tablets in 24 hours. 10 tablet 5   gabapentin (NEURONTIN) 100 MG capsule Take 1 capsule (100 mg total) by mouth 2 (two) times daily. 60 capsule 3   HYDROcodone-acetaminophen (NORCO) 10-325 MG tablet Take 1 tablet by mouth every 6 (six) hours as needed for severe pain (pain score 7-10). 30 tablet 0   meclizine (ANTIVERT)  25 MG tablet TAKE 1 TABLET BY MOUTH 3 TIMES DAILY AS NEEDED FOR DIZZINESS. 30 tablet 0    meloxicam (MOBIC) 7.5 MG tablet TAKE 1 TO 2 TABLETS DAILY AS NEEDED FOR PAIN 180 tablet 2   mometasone (NASONEX) 50 MCG/ACT nasal spray PLACE 2 SPRAYS INTO THE NOSE DAILY. 51 each 4   norethindrone (INCASSIA) 0.35 MG tablet Take 1 tablet (0.35 mg total) by mouth daily. 28 tablet 11   ondansetron (ZOFRAN-ODT) 8 MG disintegrating tablet TAKE 1 TABLET BY MOUTH EVERY 8 HOURS AS NEEDED FOR NAUSEA OR VOMITING. 20 tablet 1   tiZANidine (ZANAFLEX) 4 MG tablet TAKE 1 TABLET BY MOUTH EVERY 6 HOURS AS NEEDED FOR MUSCLE SPASMS. 90 tablet 1   traMADol (ULTRAM) 50 MG tablet TAKE 1 TABLET (50 MG TOTAL) BY MOUTH EVERY 8 (EIGHT) HOURS AS NEEDED (MOD PAIN). 30 tablet 0   TRELEGY ELLIPTA 200-62.5-25 MCG/ACT AEPB INHALE 1 PUFF BY MOUTH EVERY DAY 180 each 1   valACYclovir (VALTREX) 1000 MG tablet Take 1 tablet (1,000 mg total) by mouth 3 (three) times daily. 30 tablet 2   Vitamin D, Ergocalciferol, (DRISDOL) 1.25 MG (50000 UNIT) CAPS capsule Take 1 capsule (50,000 Units total) by mouth every 7 (seven) days. 12 capsule 1   No current facility-administered medications on file prior to visit.   She is allergic to ketorolac tromethamine, morphine, seasonal ic [octacosanol], and latex..  Review of Systems Review of Systems  Constitutional: Negative for activity change, appetite change and fatigue.  HENT: Negative for hearing loss, congestion, tinnitus and ear discharge.  dentist q87m Eyes: Negative for visual disturbance  Respiratory: Negative for cough, chest tightness and shortness of breath.   Cardiovascular: Negative for chest pain, palpitations and leg swelling.  Gastrointestinal: Negative for abdominal pain, diarrhea, constipation and abdominal distention.  Genitourinary: Negative for urgency, frequency, decreased urine volume and difficulty urinating.  Musculoskeletal: L shoulder pain Skin: Negative for color change, pallor and rash.  Neurological: Negative for dizziness, light-headedness, numbness and  headaches.  Hematological: Negative for adenopathy. Does not bruise/bleed easily.  Psychiatric/Behavioral: Negative for suicidal ideas, confusion, sleep disturbance, self-injury, dysphoric mood, decreased concentration and agitation.       Objective:    BP (!) 144/78   Pulse 98   Resp 18   Ht 5\' 7"  (1.702 m)   Wt (!) 318 lb (144.2 kg)   SpO2 99%   BMI 49.81 kg/m  General appearance: alert, cooperative, appears stated age, and no distress Head: Normocephalic, without obvious abnormality, atraumatic Eyes: conjunctivae/corneas clear. PERRL, EOM's intact. Fundi benign. Ears: normal TM's and external ear canals both ears Nose: Nares normal. Septum midline. Mucosa normal. No drainage or sinus tenderness. Throat: lips, mucosa, and tongue normal; teeth and gums normal Neck: no adenopathy, no carotid bruit, no JVD, supple, symmetrical, trachea midline, and thyroid not enlarged, symmetric, no tenderness/mass/nodules Lungs: clear to auscultation bilaterally Heart: regular rate and rhythm, S1, S2 normal, no murmur, click, rub or gallop Abdomen: soft, non-tender; bowel sounds normal; no masses,  no organomegaly Extremities: extremities normal, atraumatic, no cyanosis or edema Pulses: 2+ and symmetric Neurologic: Alert and oriented X 3, normal strength and tone. Normal symmetric reflexes. Normal coordination and gait  Predictors of intubation difficulty:  Morbid obesity? {yes***/no:17258::no}  Anatomically abnormal facies? {yes***/no:17258::no}  Prominent incisors? {yes***/no:17258::no}  Receding mandible? {yes***/no:17258::no}  Short, thick neck? {yes***/no:17258::no}  Neck range of motion: {normal/abnormal:16337::normal}  Mallampati score: {score:16733}  Thyromental distance: {distance:16735}  Mouth opening: ***cm  Dentition: {exam; EAVWU:98119}  Cardiographics  ECG: {findings; ecg:31282} Echocardiogram: {findings; ZOXW:96045}  Imaging Chest x-ray: {findings; x-ray:12530}   Lab  Review  {recent labs:19471}    Assessment:      53 y.o. female with planned surgery as above.   Known risk factors for perioperative complications: reactive airway disease    Difficulty with intubation is not anticipated.  Cardiac Risk Estimation: ***  Current medications which may produce withdrawal symptoms if withheld perioperatively: ***    Plan:    1. Preoperative workup as follows {studies; preop:16696}. 2. Change in medication regimen before surgery: {meds; preop:16697}. 3. Prophylaxis for cardiac events with perioperative beta-blockers: {preop beta-blockers:16698}. 4. Invasive hemodynamic monitoring perioperatively: {not indicated/strongly advised:16699}. 5. Deep vein thrombosis prophylaxis postoperatively:{dvt prophylaxis:16689}. 6. Surveillance for postoperative MI with ECG immediately postoperatively and on postoperative days 1 and 2 AND troponin levels 24 hours postoperatively and on day 4 or hospital discharge (whichever comes first): {not indicated/strongly advised:16699}. 7. Other measures: {preop recommendations:16691}

## 2023-09-12 LAB — CBC WITH DIFFERENTIAL/PLATELET
Absolute Lymphocytes: 1397 {cells}/uL (ref 850–3900)
Absolute Monocytes: 466 {cells}/uL (ref 200–950)
Basophils Absolute: 39 {cells}/uL (ref 0–200)
Basophils Relative: 0.8 %
Eosinophils Absolute: 98 {cells}/uL (ref 15–500)
Eosinophils Relative: 2 %
HCT: 42.4 % (ref 35.0–45.0)
Hemoglobin: 14.2 g/dL (ref 11.7–15.5)
MCH: 28.7 pg (ref 27.0–33.0)
MCHC: 33.5 g/dL (ref 32.0–36.0)
MCV: 85.7 fL (ref 80.0–100.0)
MPV: 12.8 fL — ABNORMAL HIGH (ref 7.5–12.5)
Monocytes Relative: 9.5 %
Neutro Abs: 2901 {cells}/uL (ref 1500–7800)
Neutrophils Relative %: 59.2 %
Platelets: 254 10*3/uL (ref 140–400)
RBC: 4.95 10*6/uL (ref 3.80–5.10)
RDW: 12 % (ref 11.0–15.0)
Total Lymphocyte: 28.5 %
WBC: 4.9 10*3/uL (ref 3.8–10.8)

## 2023-09-12 LAB — COMPREHENSIVE METABOLIC PANEL
AG Ratio: 1.7 (calc) (ref 1.0–2.5)
ALT: 18 U/L (ref 6–29)
AST: 16 U/L (ref 10–35)
Albumin: 4.1 g/dL (ref 3.6–5.1)
Alkaline phosphatase (APISO): 69 U/L (ref 37–153)
BUN: 15 mg/dL (ref 7–25)
CO2: 27 mmol/L (ref 20–32)
Calcium: 9.3 mg/dL (ref 8.6–10.4)
Chloride: 104 mmol/L (ref 98–110)
Creat: 0.55 mg/dL (ref 0.50–1.03)
Globulin: 2.4 g/dL (ref 1.9–3.7)
Glucose, Bld: 104 mg/dL — ABNORMAL HIGH (ref 65–99)
Potassium: 4.2 mmol/L (ref 3.5–5.3)
Sodium: 139 mmol/L (ref 135–146)
Total Bilirubin: 0.6 mg/dL (ref 0.2–1.2)
Total Protein: 6.5 g/dL (ref 6.1–8.1)

## 2023-09-12 LAB — LIPID PANEL
Cholesterol: 198 mg/dL (ref <200)
HDL: 83 mg/dL (ref >=50)
LDL Cholesterol (Calc): 99 mg/dL
Non-HDL Cholesterol (Calc): 115 mg/dL (ref <130)
Total CHOL/HDL Ratio: 2.4 (calc) (ref <5.0)
Triglycerides: 74 mg/dL (ref <150)

## 2023-09-13 ENCOUNTER — Encounter: Payer: Self-pay | Admitting: Family Medicine

## 2023-09-19 ENCOUNTER — Other Ambulatory Visit: Payer: Self-pay

## 2023-09-19 ENCOUNTER — Encounter: Payer: Self-pay | Admitting: Family Medicine

## 2023-09-19 DIAGNOSIS — J069 Acute upper respiratory infection, unspecified: Secondary | ICD-10-CM

## 2023-09-19 MED ORDER — MOMETASONE FUROATE 50 MCG/ACT NA SUSP
2.0000 | Freq: Every day | NASAL | 4 refills | Status: DC
Start: 2023-09-19 — End: 2024-05-26

## 2023-09-24 ENCOUNTER — Other Ambulatory Visit (HOSPITAL_COMMUNITY): Payer: Self-pay

## 2023-09-24 ENCOUNTER — Encounter: Payer: Self-pay | Admitting: Pharmacy Technician

## 2023-09-24 NOTE — Telephone Encounter (Signed)
 Pharmacy Patient Advocate Encounter  Received notification from [facility] that Prior Authorization for Botox 200U has been  Expired It looks like now that ins has expired. I'm not finding any new insurance. I called the pt to get new info. No answer, mailbox is full. Will send mychart to see if she can send her new pharmacy benefit info.   PA #/Case ID/Reference #: PA Case ID #: [id number]

## 2023-09-24 NOTE — Telephone Encounter (Signed)
 Patient waiting on her insurance to be reinstated. Once that is done she will call us or send a mychart with a copy of the card.

## 2023-09-25 NOTE — Telephone Encounter (Signed)
 Thank you. We'll be waiting for it!

## 2023-09-27 ENCOUNTER — Other Ambulatory Visit: Payer: Self-pay | Admitting: Family Medicine

## 2023-09-27 DIAGNOSIS — J111 Influenza due to unidentified influenza virus with other respiratory manifestations: Secondary | ICD-10-CM

## 2023-10-02 ENCOUNTER — Telehealth: Payer: Self-pay | Admitting: Neurology

## 2023-10-02 NOTE — Telephone Encounter (Signed)
 Received Surgical Clearance paperwork. Forms are in Dr. Moises Blood box

## 2023-10-03 NOTE — Telephone Encounter (Signed)
 Completed. Will fax over today.

## 2023-10-10 ENCOUNTER — Telehealth: Payer: Self-pay | Admitting: Neurology

## 2023-10-10 NOTE — Telephone Encounter (Signed)
 Form re faxed to the fax number on form [phone]

## 2023-10-10 NOTE — Telephone Encounter (Signed)
 Agent did not rcv fax per Sheena "Note Completed. Will fax over today.     " Please try again with Fax#[phone]

## 2023-10-11 ENCOUNTER — Other Ambulatory Visit: Payer: Self-pay | Admitting: Family Medicine

## 2023-10-11 DIAGNOSIS — M62838 Other muscle spasm: Secondary | ICD-10-CM

## 2023-10-15 ENCOUNTER — Encounter (HOSPITAL_BASED_OUTPATIENT_CLINIC_OR_DEPARTMENT_OTHER): Payer: Self-pay | Admitting: Adult Health

## 2023-10-15 ENCOUNTER — Encounter (HOSPITAL_BASED_OUTPATIENT_CLINIC_OR_DEPARTMENT_OTHER): Payer: Self-pay

## 2023-10-15 ENCOUNTER — Ambulatory Visit (INDEPENDENT_AMBULATORY_CARE_PROVIDER_SITE_OTHER): Payer: Self-pay

## 2023-10-15 ENCOUNTER — Ambulatory Visit (INDEPENDENT_AMBULATORY_CARE_PROVIDER_SITE_OTHER): Payer: BC Managed Care – PPO | Admitting: Adult Health

## 2023-10-15 VITALS — BP 126/84 | HR 91 | Ht 67.0 in | Wt 321.7 lb

## 2023-10-15 DIAGNOSIS — Z01811 Encounter for preprocedural respiratory examination: Secondary | ICD-10-CM

## 2023-10-15 DIAGNOSIS — J4540 Moderate persistent asthma, uncomplicated: Secondary | ICD-10-CM

## 2023-10-15 DIAGNOSIS — Z87891 Personal history of nicotine dependence: Secondary | ICD-10-CM

## 2023-10-15 DIAGNOSIS — R0683 Snoring: Secondary | ICD-10-CM

## 2023-10-15 DIAGNOSIS — I10 Essential (primary) hypertension: Secondary | ICD-10-CM | POA: Diagnosis not present

## 2023-10-15 DIAGNOSIS — J45909 Unspecified asthma, uncomplicated: Secondary | ICD-10-CM

## 2023-10-15 NOTE — Patient Instructions (Addendum)
 Continue on Trelegy 1 puff daily, rinse after use.  Albuterol inhaler or neb As needed   Continue on Zyrtec and Nasonex daily  Chest xray today  Set up for PFT   Set up for home sleep study  Use caution with sedating medications Work on healthy weight loss.  Do not drive if sleepy   Follow up in 6 weeks and As needed  -Virtual visit

## 2023-10-15 NOTE — Progress Notes (Signed)
 Epworth Sleepiness Scale  Use the following scale to choose the most appropriate number for each situation. 0 Would never nod off 1  Slight  chance of nodding off 2 Moderate chance of nodding off 3 High chance of nodding off  Sitting and reading: 0 Watching TV: 1 Sitting, inactive, in a public place (e.g., in a meeting, theater, or dinner event): 0 As a passenger in a car for an hour or more without stopping for a break: 1 Lying down to rest when circumstances permit:3 Sitting and talking to someone: 0 Sitting quietly after a meal without alcohol: 1 In a car, while stopped for a few  minutes in traffic or at a light: 0  TOTOAL: 6

## 2023-10-15 NOTE — Progress Notes (Signed)
 @Patient  ID: Jo Jackson, female    DOB: Jul 17, 1971, 53 y.o.   MRN: [id number]  Chief Complaint  Patient presents with   Follow-up   Discussed the use of AI scribe software for clinical note transcription with the patient, who gave verbal consent to proceed.  Referring provider: [name], *  HPI: 53 year old female former smoker followed for asthma and chronic allergic rhinitis   TEST/EVENTS :  Chest x-ray July 2023 showed clear lungs.  10/15/2023 Follow up : Asthma , PreOp Risk Assessment Patient is here for a follow-up visit.  She was last seen in August 2023.  Patient is followed for moderate persistent asthma.  Last visit patient was changed from Advair to Trelegy. Patient says overall she is doing well.  Denies flare of cough or wheezing.  She has not required frequent prednisone use, only up to twice a year on average if she gets sinus infections that exacerbate her asthma. She remains on Zyrtec and Nasonex daily.  Feels that it controls her allergies well.  She is undergoing a preoperative risk assessment for a left shoulder replacement. She is planning surgery in next month. 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 She has been off work since July of the previous year due to shoulder issues and insurance complications.  She experiences symptoms suggestive of sleep apnea, including loud snoring, daytime sleepiness, and frequent awakenings at night. She takes frequent naps during the day and consumes a significant amount of caffeine, which she states does not affect her sleep due to her ADHD. No history of stroke or congestive heart failure is noted. Does not take sleep aides.   She has a history of supraventricular tachycardia (SVT) that was ablated in 2002 and occasionally experiences junctional rhythm. She also has intermittent high blood pressure, with readings varying between normal and elevated.  She is a former smoker,  works part-time at [employer], is fully independent and drives.  Husband died of Covid complications couple of years ago.         Allergies  Allergen Reactions   Ketorolac Tromethamine Hives   Morphine Other (See Comments)    "makes me the devil."   Seasonal Ic [Octacosanol]    Latex Rash and Other (See Comments)    Latex catheter caused extreme irritation    Immunization History  Administered Date(s) Administered   Hepb-cpg 04/06/2022, 03/20/2023   Influenza Whole 04/26/2009, 04/21/2010   Influenza, Mdck, Trivalent,PF 6+ MOS(egg free) 03/20/2023   Influenza,inj,Quad PF,6+ Mos 04/23/2013, 04/22/2015, 05/11/2016, 05/10/2017, 03/22/2018, 05/23/2019, 06/19/2020, 04/15/2021, 03/03/2022   Influenza-Unspecified 06/19/2020   Janssen (J&J) SARS-COV-2 Vaccination 10/04/2019, 06/19/2020   Moderna Sars-Covid-2 Vaccination 06/19/2020   PFIZER(Purple Top)SARS-COV-2 Vaccination 04/01/2021   PNEUMOCOCCAL CONJUGATE-20 04/01/2021, 03/16/2022   Tdap 06/24/2022   Zoster Recombinant(Shingrix) 03/03/2022, 03/20/2023    Past Medical History:  Diagnosis Date   Abnormal Pap smear    Repeats WNL   ADD (attention deficit disorder)    Anxiety    Arthritis    Oseteoarthritis   Depression    GERD (gastroesophageal reflux disease)    Headache(784.0)    Migraines   Junctional escape rhythm    Neuromuscular disorder (HCC)    Fibromyalgia   Pregnancy induced hypertension    Supraventricular tachycardia (HCC)     Tobacco History: Social History   Tobacco Use  Smoking Status Former   Current packs/day: 0.00   Types: Cigarettes   Quit date: 10/23/2007   Years since quitting:  15.9  Smokeless Tobacco Never   Counseling given: Not Answered   Outpatient Medications Prior to Visit  Medication Sig Dispense Refill   albuterol (PROVENTIL) (2.5 MG/3ML) 0.083% nebulizer solution Take 3 mLs (2.5 mg total) by nebulization every 6 (six) hours as needed for wheezing or shortness of breath. 75 mL 12    albuterol (VENTOLIN HFA) 108 (90 Base) MCG/ACT inhaler Inhale 2 puffs into the lungs every 6 (six) hours as needed for wheezing or shortness of breath. 18 g 5   amphetamine-dextroamphetamine (ADDERALL) 30 MG tablet Take 1 tablet by mouth 2 (two) times daily. 180 tablet 0   botulinum toxin Type A (BOTOX) 200 units injection Inject 200 Units into the muscle every 3 (three) months. Inject 155 units IM into multiple site in the face,neck and head once every 90 days 1 each 4   buPROPion (WELLBUTRIN XL) 300 MG 24 hr tablet Take 1 tablet (300 mg total) by mouth daily. 30 tablet 2   citalopram (CELEXA) 40 MG tablet Take 1 tablet (40 mg total) by mouth daily. 30 tablet 3   eletriptan (RELPAX) 40 MG tablet Take 1 tablet (40 mg total) by mouth as needed for migraine or headache. May repeat after 2 hours.  Maximum 2 tablets in 24 hours. 10 tablet 5   gabapentin (NEURONTIN) 100 MG capsule Take 1 capsule (100 mg total) by mouth 2 (two) times daily. 60 capsule 3   HYDROcodone-acetaminophen (NORCO) 10-325 MG tablet Take 1 tablet by mouth every 6 (six) hours as needed for severe pain (pain score 7-10). 30 tablet 0   meclizine (ANTIVERT) 25 MG tablet TAKE 1 TABLET BY MOUTH 3 TIMES DAILY AS NEEDED FOR DIZZINESS. 30 tablet 0   meloxicam (MOBIC) 7.5 MG tablet TAKE 1 TO 2 TABLETS DAILY AS NEEDED FOR PAIN 180 tablet 2   mometasone (NASONEX) 50 MCG/ACT nasal spray Place 2 sprays into the nose daily. 51 each 4   norethindrone (INCASSIA) 0.35 MG tablet Take 1 tablet (0.35 mg total) by mouth daily. 28 tablet 11   ondansetron (ZOFRAN-ODT) 8 MG disintegrating tablet TAKE 1 TABLET BY MOUTH EVERY 8 HOURS AS NEEDED FOR NAUSEA OR VOMITING. 20 tablet 1   tiZANidine (ZANAFLEX) 4 MG tablet TAKE 1 TABLET BY MOUTH EVERY 6 HOURS AS NEEDED FOR MUSCLE SPASMS. 90 tablet 1   traMADol (ULTRAM) 50 MG tablet TAKE 1 TABLET (50 MG TOTAL) BY MOUTH EVERY 8 (EIGHT) HOURS AS NEEDED (MOD PAIN). 30 tablet 0   TRELEGY ELLIPTA 200-62.5-25 MCG/ACT AEPB  INHALE 1 PUFF BY MOUTH EVERY DAY 180 each 1   valACYclovir (VALTREX) 1000 MG tablet Take 1 tablet (1,000 mg total) by mouth 3 (three) times daily. 30 tablet 2   Vitamin D, Ergocalciferol, (DRISDOL) 1.25 MG (50000 UNIT) CAPS capsule TAKE 1 CAPSULE (50,000 UNITS TOTAL) BY MOUTH EVERY 7 (SEVEN) DAYS 12 capsule 1   No facility-administered medications prior to visit.     Review of Systems:   Constitutional:   No  weight loss, night sweats,  Fevers, chills,+ fatigue, or  lassitude.  HEENT:   No headaches,  Difficulty swallowing,  Tooth/dental problems, or  Sore throat,                No sneezing, itching, ear ache, +nasal congestion, post nasal drip,   CV:  No chest pain,  Orthopnea, PND, swelling in lower extremities, anasarca, dizziness, palpitations, syncope.   GI  No heartburn, indigestion, abdominal pain, nausea, vomiting, diarrhea, change in bowel habits, loss  of appetite, bloody stools.   Resp: No shortness of breath with exertion or at rest.  No excess mucus, no productive cough,  No non-productive cough,  No coughing up of blood.  No change in color of mucus.  No wheezing.  No chest wall deformity  Skin: no rash or lesions.  GU: no dysuria, change in color of urine, no urgency or frequency.  No flank pain, no hematuria   MS: Shoulder pain  Physical Exam  BP 126/84   Pulse 91   Ht 5\' 7"  (1.702 m)   Wt (!) 321 lb 11 oz (145.9 kg)   SpO2 97%   BMI 50.38 kg/m   GEN: A/Ox3; pleasant , NAD, well nourished    HEENT:  [place]/AT,   NOSE-clear, THROAT-clear, no lesions, no postnasal drip or exudate noted.  Class III-IV MP airway  NECK:  Supple w/ fair ROM; no JVD; normal carotid impulses w/o bruits; no thyromegaly or nodules palpated; no lymphadenopathy.    RESP  Clear  P & A; w/o, wheezes/ rales/ or rhonchi. no accessory muscle use, no dullness to percussion  CARD:  RRR, no m/r/g, no peripheral edema, pulses intact, no cyanosis or clubbing.  GI:   Soft & nt; nml bowel sounds; no  organomegaly or masses detected.   Musco: Warm bil, no deformities or joint swelling noted.   Neuro: alert, no focal deficits noted.    Skin: Warm, no lesions or rashes    Lab Results:    BNP No results found for: "BNP"  ProBNP No results found for: "PROBNP"  Imaging: No results found.  Administration History     None           No data to display          No results found for: "NITRICOXIDE"      Assessment & Plan:   Assessment and Plan    Moderate persistent asthma   Asthma is well-controlled with daily Trelegy and as-needed albuterol. There have been no recent exacerbations requiring frequent prednisone, Albuterol is not used daily when asymptomatic. Asthma poses a mild surgical risk, but she is fully independent and not on oxygen.  Continue Trelegy daily and albuterol as needed. Order a pulmonary function test (PFT) to establish a baseline before surgery.  Check chest x-ray today.  Advise taking Trelegy  day of surgery. Educate on the importance of mobility post-surgery to reduce pneumonia and asthma flare risk.  Pulmonary Preoperative risk assessment for shoulder surgery   She is undergoing preoperative evaluation for left shoulder replacement Asthma is well-controlled, posing a mild surgical risk, with no significant pulmonary restrictions anticipated. She is fully independent and has a no known history of anesthesia complication.  Emphasize the need for postoperative mobility to prevent complications. Order a chest x-ray to establish a baseline before surgery. PFT pending   Major Pulmonary risks identified in the multifactorial risk analysis are but not limited to a) pneumonia; b) recurrent intubation risk; c) prolonged or recurrent acute respiratory failure needing mechanical ventilation; d) prolonged hospitalization; e) DVT/Pulmonary embolism; f) Acute Pulmonary edema  Recommend 1. Short duration of surgery as much as possible and avoid paralytic if  possible  Aggressive pulmonary toilet with o2, bronchodilatation, and incentive spirometry and early ambulation   Suspected obstructive sleep apnea   She reports symptoms suggestive of obstructive sleep apnea, including snoring, daytime sleepiness, and fragmented sleep. No formal diagnosis yet, but she is interested in evaluation.  Plan for a home sleep study to  evaluate her condition. Order a home sleep study   Hypertension   Intermittent hypertension is reported, with normal readings during the visit.  Follow-up   Follow-up plans are discussed to review test and study results. Offer the option for a virtual follow-up visit to discuss sleep study results, coordinating with her surgery schedule. Schedule a follow-up visit in 6-8 weeks to review sleep study and PFT results. Offer a virtual follow-up option to discuss sleep study results and coordinate follow-up timing with her surgery schedule.         Rubye Oaks, NP 10/15/2023

## 2023-10-16 ENCOUNTER — Encounter (HOSPITAL_BASED_OUTPATIENT_CLINIC_OR_DEPARTMENT_OTHER)

## 2023-10-17 ENCOUNTER — Other Ambulatory Visit: Payer: Self-pay | Admitting: Family Medicine

## 2023-10-19 ENCOUNTER — Ambulatory Visit (INDEPENDENT_AMBULATORY_CARE_PROVIDER_SITE_OTHER): Payer: Self-pay | Admitting: Pulmonary Disease

## 2023-10-19 DIAGNOSIS — J4540 Moderate persistent asthma, uncomplicated: Secondary | ICD-10-CM

## 2023-10-19 LAB — PULMONARY FUNCTION TEST
DL/VA % pred: 115 %
DL/VA: 4.84 ml/min/mmHg/L
DLCO cor % pred: 109 %
DLCO cor: 25.28 ml/min/mmHg
DLCO unc % pred: 111 %
DLCO unc: 25.88 ml/min/mmHg
FEF 25-75 Post: 2.64 L/s
FEF 25-75 Pre: 2.4 L/s
FEF2575-%Change-Post: 9 %
FEF2575-%Pred-Post: 90 %
FEF2575-%Pred-Pre: 82 %
FEV1-%Change-Post: 1 %
FEV1-%Pred-Post: 90 %
FEV1-%Pred-Pre: 89 %
FEV1-Post: 2.79 L
FEV1-Pre: 2.74 L
FEV1FVC-%Change-Post: 6 %
FEV1FVC-%Pred-Pre: 97 %
FEV6-%Change-Post: -3 %
FEV6-%Pred-Post: 88 %
FEV6-%Pred-Pre: 92 %
FEV6-Post: 3.37 L
FEV6-Pre: 3.49 L
FEV6FVC-%Pred-Post: 102 %
FEV6FVC-%Pred-Pre: 102 %
FVC-%Change-Post: -3 %
FVC-%Pred-Post: 86 %
FVC-%Pred-Pre: 90 %
FVC-Post: 3.37 L
FVC-Pre: 3.51 L
Post FEV1/FVC ratio: 83 %
Post FEV6/FVC ratio: 100 %
Pre FEV1/FVC ratio: 78 %
Pre FEV6/FVC Ratio: 100 %
RV % pred: 90 %
RV: 1.77 L
TLC % pred: 97 %
TLC: 5.37 L

## 2023-10-19 NOTE — Progress Notes (Signed)
 Full PFT Performed Today

## 2023-10-19 NOTE — Patient Instructions (Signed)
 Full PFT Performed Today

## 2023-10-24 ENCOUNTER — Telehealth: Payer: Self-pay | Admitting: Adult Health

## 2023-10-24 NOTE — Telephone Encounter (Signed)
 Fax received from Dr. Jones Broom with [facility] to perform a left reverse total shoulder arthroplasty on patient under choice anesthesia.  Patient needs surgery clearance. Surgery is TBD. Patient was seen on 10/15/23. Office protocol is a risk assessment can be sent to surgeon if patient has been seen in 60 days or less.   Pt has appt with Tammy 11/27/23 and will discuss clearance at this time per last visit.   Will hold in clearance pool for now

## 2023-10-31 ENCOUNTER — Telehealth: Payer: Self-pay | Admitting: *Deleted

## 2023-10-31 ENCOUNTER — Other Ambulatory Visit: Payer: Self-pay

## 2023-10-31 NOTE — Telephone Encounter (Signed)
 See phone note dated 10/31/23

## 2023-10-31 NOTE — Telephone Encounter (Signed)
 Copied from CRM [id number]. Topic: Referral - Status >> Oct 30, 2023  3:18 PM Payton Doughty wrote: Reason for CRM: [facility] is waiting on the surgical clearance letter.  Pt seen on 3/24 for clearance. [phone]  [place] Fax [phone]  Note from 10/15/23 was faxed to [facility]

## 2023-11-02 ENCOUNTER — Telehealth: Payer: Self-pay | Admitting: Pharmacy Technician

## 2023-11-02 ENCOUNTER — Other Ambulatory Visit (HOSPITAL_COMMUNITY): Payer: Self-pay

## 2023-11-02 NOTE — Telephone Encounter (Signed)
error 

## 2023-11-03 ENCOUNTER — Other Ambulatory Visit: Payer: Self-pay | Admitting: Pulmonary Disease

## 2023-11-03 DIAGNOSIS — J4540 Moderate persistent asthma, uncomplicated: Secondary | ICD-10-CM

## 2023-11-05 ENCOUNTER — Other Ambulatory Visit: Payer: Self-pay | Admitting: Family Medicine

## 2023-11-05 DIAGNOSIS — M62838 Other muscle spasm: Secondary | ICD-10-CM

## 2023-11-05 DIAGNOSIS — R110 Nausea: Secondary | ICD-10-CM

## 2023-11-05 DIAGNOSIS — G43809 Other migraine, not intractable, without status migrainosus: Secondary | ICD-10-CM

## 2023-11-05 DIAGNOSIS — B001 Herpesviral vesicular dermatitis: Secondary | ICD-10-CM

## 2023-11-05 NOTE — Telephone Encounter (Signed)
 Requesting: tramadol 50mg   Contract: 02/01/23 UDS: 02/01/23 Last Visit: 09/11/23 Next Visit: None Last Refill: 07/19/23 #30 and 0RF   Please Advise

## 2023-11-06 ENCOUNTER — Other Ambulatory Visit: Payer: Self-pay | Admitting: Orthopedic Surgery

## 2023-11-12 ENCOUNTER — Other Ambulatory Visit: Payer: Self-pay | Admitting: Family Medicine

## 2023-11-12 DIAGNOSIS — Z0000 Encounter for general adult medical examination without abnormal findings: Secondary | ICD-10-CM

## 2023-11-12 DIAGNOSIS — M797 Fibromyalgia: Secondary | ICD-10-CM

## 2023-11-12 MED ORDER — HYDROCODONE-ACETAMINOPHEN 10-325 MG PO TABS: 1.0000 | ORAL_TABLET | Freq: Four times a day (QID) | ORAL | 0 refills | Status: DC | PRN

## 2023-11-12 NOTE — Telephone Encounter (Signed)
 Requesting: [facility]CO Contract: 01/22/2023 UDS: " " Last OV: 09/11/2023 Next OV: n/a Last Refill: 08/10/2023, #30--0 RF Database:   Please advise

## 2023-11-22 NOTE — Progress Notes (Addendum)
 Patient had a migraine and could not come in for her PST testing. A Phone call PST was done and the patient will come in  on      for labs and to get supplies for surgery.   The patient was identified using 2 approved identifiers. All issues noted in this document were discussed and addressed, Ms Jo Jackson voiced understanding and agreement with all preoperative instructions. The patient was emailed the surgery instructions per her request.      The patient was instructed to call the Admitting Office [phone] or [phone]) to complete their Pre-surgical Interview.   COVID Vaccine received:  []  No [x]  Yes Date of any COVID positive Test in last 90 days:  PCP - Roel Clarity, DO clearance in 09-11-23 Epic note  Cardiologist -  ?? Can't remember name, Had SVT ablation in 2002 Neurology-  Janne Members, DO Pulmonary-  Roena Clark, NP  Chest x-ray - 10-15-2023  2v Epic EKG - 09-11-2023  Epic   Stress Test -  ECHO -  Cardiac Cath -   Pacemaker / ICD device [x]  No []  Yes   Spinal Cord Stimulator:[x]  No []  Yes       History of Sleep Apnea? []  No [x]  Yes   CPAP used?- [x]  No []  Yes    Does the patient monitor blood sugar?   [x]  N/A   []  No []  Yes  Patient has: [x]  NO Hx DM   []  Pre-DM   []  DM1  []   DM2 Last A1c was: 5.3  on   11-17-2022    Blood Thinner / Instructions:  none Aspirin Instructions:  none  ERAS Protocol Ordered: []  No  [x]  Yes PRE-SURGERY [x]  ENSURE  []  G2  Patient is to be NPO after: 0430  Dental hx: []  Dentures:  []  N/A      [x]  Bridge or Partial:  upper partial denture                  []  Loose or Damaged teeth:   Comments: The patient was given Benzoyl peroxide Gel as ordered. Instruction regarding application starting 2 days prior to surgery was given and patient voiced understanding.   Patient was given the 5 CHG shower / bath instructions for Reverse Shoulder arthroplasty surgery along with 2 bottles of the CHG soap. Patient will start this on:   12-02-23            Activity level: Patient is able to climb a flight of stairs without difficulty; [x]  No CP  but would have _SOB   Patient can perform ADLs without assistance.   Anesthesia review: ADHD, GAD, GERD, HTN, fibromyalgia, asthma, SVT ? ablation 2002 ?  Patient reports that she h  Patient denies shortness of breath, fever, cough and chest pain at PAT appointment.  Patient verbalized understanding and agreement to the Pre-Surgical Instructions that were given to them at this PAT appointment. Patient was also educated of the need to review these PAT instructions again prior to her surgery.I reviewed the appropriate phone numbers to call if they have any and questions or concerns.

## 2023-11-22 NOTE — Patient Instructions (Addendum)
 SURGICAL WAITING ROOM VISITATION Patients having surgery or a procedure may have no more than 2 support people in the waiting area - these visitors may rotate in the visitor waiting room.   If the patient needs to stay at the hospital during part of their recovery, the visitor guidelines for inpatient rooms apply.  PRE-OP VISITATION  Pre-op nurse will coordinate an appropriate time for 1 support person to accompany the patient in pre-op.  This support person may not rotate.  This visitor will be contacted when the time is appropriate for the visitor to come back in the pre-op area.  Please refer to the [facility] website for the visitor guidelines for Inpatients (after your surgery is over and you are in a regular room).  You are not required to quarantine at this time prior to your surgery. However, you must do this: Hand Hygiene often Do NOT share personal items Notify your provider if you are in close contact with someone who has COVID or you develop fever 100.4 or greater, new onset of sneezing, cough, sore throat, shortness of breath or body aches.  If you test positive for Covid or have been in contact with anyone that has tested positive in the last 10 days please notify you surgeon.    Your procedure is scheduled on:  THURSDAY  Dec 06, 2023  Report to [facility] Main Entrance: [address] entrance where the [employer] is available.   Report to admitting at: 05:15    AM  Call this number if you have any questions or problems the morning of surgery [phone]  Do not eat food after Midnight the night prior to your surgery/procedure.  After Midnight you may have the following liquids until   04:30 AM  DAY OF SURGERY  Clear Liquid Diet Water Black Coffee (sugar ok, NO MILK/CREAM OR CREAMERS)  Tea (sugar ok, NO MILK/CREAM OR CREAMERS) regular and decaf                             Plain Jell-O  with no fruit (NO RED)                                           Fruit  ices (not with fruit pulp, NO RED)                                     Popsicles (NO RED)                                                                  Juice: NO CITRUS JUICES: only apple, WHITE grape, WHITE cranberry Sports drinks like Gatorade or Powerade (NO RED)                   The day of surgery:  Drink ONE (1) Pre-Surgery Clear Ensure  at   04:30     AM the morning of surgery. Drink in one sitting. Do not sip.  This drink was given to you during your hospital  pre-op appointment visit. Nothing else to drink after completing the Pre-Surgery Clear Ensure : No candy, chewing gum or throat lozenges.    FOLLOW ANY ADDITIONAL PRE OP INSTRUCTIONS YOU RECEIVED FROM YOUR SURGEON'S OFFICE!!!   Oral Hygiene is also important to reduce your risk of infection.        Remember - BRUSH YOUR TEETH THE MORNING OF SURGERY WITH YOUR REGULAR TOOTHPASTE  Do NOT smoke after Midnight the night before surgery.  STOP TAKING all Vitamins, Herbs and supplements 1 week before your surgery.   DO NOT TAKE your Adderall on the day of your surgery.  Take ONLY these medicines the morning of surgery with A SIP OF WATER: cetirizine, citalopram, bupropion , and Gabapentin . You may use your Nasal spray and you inhalers / nebulizers if needed. Please bring your Albuterol  inhaler with you on the day of surgery.                   You may not have any metal on your body including hair pins, jewelry, and body piercing  Do not wear make-up, lotions, powders, perfumes or deodorant  Do not wear nail polish including gel and S&S, artificial / acrylic nails, or any other type of covering on natural nails including finger and toenails. If you have artificial nails, gel coating, etc., that needs to be removed by a nail salon, Please have this removed prior to surgery. Not doing so may mean that your surgery could be cancelled or delayed if the Surgeon or anesthesia staff feels like they are unable to monitor you safely.    Do not shave 48 hours prior to surgery to avoid nicks in your skin which may contribute to postoperative infections.    Contacts, Hearing Aids, dentures or bridgework may not be worn into surgery. DENTURES WILL BE REMOVED PRIOR TO SURGERY PLEASE DO NOT APPLY "Poly grip" OR ADHESIVES!!!  Patients discharged on the day of surgery will not be allowed to drive home.  Someone NEEDS to stay with you for the first 24 hours after anesthesia.  Do not bring your home medications to the hospital. The Pharmacy will dispense medications listed on your medication list to you during your admission in the Hospital.  Please read over the following fact sheets you were given: IF YOU HAVE QUESTIONS ABOUT YOUR PRE-OP INSTRUCTIONS, PLEASE CALL [phone].     Pre-operative 5 CHG Bath Instructions   You can play a key role in reducing the risk of infection after surgery. Your skin needs to be as free of germs as possible. You can reduce the number of germs on your skin by washing with CHG (chlorhexidine gluconate) soap before surgery. CHG is an antiseptic soap that kills germs and continues to kill germs even after washing.   DO NOT use if you have an allergy to chlorhexidine/CHG or antibacterial soaps. If your skin becomes reddened or irritated, stop using the CHG and notify one of our RNs at [phone]  Please shower with the CHG soap starting 4 days before surgery using the following schedule: START SHOWERS ON   SUNDAY  Dec 02, 2023  Please keep in mind the following:  DO NOT shave, including legs and underarms, starting the day of your first shower.   You may shave your face at any point before/day of surgery.   Place clean sheets on your bed the day you start using CHG soap. Use a clean washcloth (not used since being washed)  for each shower. DO NOT sleep with pets once you start using the CHG.   CHG Shower Instructions:  If you choose to wash your hair and private area, wash first with your normal shampoo/soap.  After you use shampoo/soap, rinse your hair and body thoroughly to remove shampoo/soap residue.  Turn the water OFF and apply about 3 tablespoons (45 ml) of CHG soap to a CLEAN washcloth.  Apply CHG soap ONLY FROM YOUR NECK DOWN TO YOUR TOES (washing for 3-5 minutes)  DO NOT use CHG soap on face, private areas, open wounds, or sores.  Pay special attention to the area where your surgery is being performed.  If you are having back surgery, having someone wash your back for you may be helpful.  Wait 2 minutes after CHG soap is applied, then you may rinse off the CHG soap.  Pat dry with a clean towel  Put on clean clothes/pajamas   If you choose to wear lotion, please use ONLY the CHG-compatible lotions on the back of this paper.     Additional instructions for the day of surgery: DO NOT APPLY any lotions, deodorants, cologne, or perfumes.   Put on clean/comfortable clothes.  Brush your teeth.  Ask your nurse before applying any prescription medications to the skin.      Preparing for Total Shoulder Arthroplasty ================================================================= Please follow these instructions carefully, in addition to any other special Bathing information that was explained to you at the Presurgical Appointment:  BENZOYL PEROXIDE 5% GEL: Used to kill bacteria on the skin which could cause an infection at the surgery site.   Please do not use if you have an allergy to benzoyl peroxide. If your skin becomes reddened/irritated stop using the benzoyl peroxide and inform your Doctor.   Starting two days before surgery, apply as follows:  1. Apply benzoyl peroxide gel in the morning and at night. Apply after taking a shower. If you are not taking a shower, clean entire shoulder front,  back, and side, along with the armpit with a clean wet washcloth.  2. Place a quarter-sized dollop of the gel on your SHOULDER and rub in thoroughly, making sure to cover the front, back, and side of your shoulder, along with the armpit.   2 Days prior to Surgery   TUESDAY  12-04-2023 First Application _______ Morning Second Application _______ Night  Day Before Surgery    [facility]  12-05-23 First Application______ Morning  On the night before surgery, wash your entire body (except hair, face and private areas) with CHG Soap. THEN, rub in the LAST application of the Benzoyl Peroxide Gel on your shoulder.   3. On the Morning of Surgery wash your BODY AGAIN with CHG Soap (except hair, face and private areas)  4. DO NOT USE THE BENZOYL PEROXIDE GEL ON THE DAY OF YOUR SURGERY          CHG Compatible Lotions   Aveeno Moisturizing lotion  Cetaphil Moisturizing Cream  Cetaphil Moisturizing Lotion  Clairol Herbal Essence Moisturizing Lotion, Dry Skin  Clairol Herbal Essence Moisturizing Lotion, Extra Dry Skin  Clairol Herbal Essence Moisturizing Lotion, Normal Skin  Curel Age  Defying Therapeutic Moisturizing Lotion with Alpha Hydroxy  Curel Extreme Care Body Lotion  Curel Soothing Hands Moisturizing Hand Lotion  Curel Therapeutic Moisturizing Cream, Fragrance-Free  Curel Therapeutic Moisturizing Lotion, Fragrance-Free  Curel Therapeutic Moisturizing Lotion, Original Formula  Eucerin Daily Replenishing Lotion  Eucerin Dry Skin Therapy Plus Alpha Hydroxy Crme  Eucerin Dry Skin Therapy Plus Alpha Hydroxy Lotion  Eucerin Original Crme  Eucerin Original Lotion  Eucerin Plus Crme Eucerin Plus Lotion  Eucerin TriLipid Replenishing Lotion  Keri Anti-Bacterial Hand Lotion  Keri Deep Conditioning Original Lotion Dry Skin Formula Softly Scented  Keri Deep Conditioning Original Lotion, Fragrance Free Sensitive Skin Formula  Keri Lotion Fast Absorbing Fragrance Free Sensitive Skin  Formula  Keri Lotion Fast Absorbing Softly Scented Dry Skin Formula  Keri Original Lotion  Keri Skin Renewal Lotion Keri Silky Smooth Lotion  Keri Silky Smooth Sensitive Skin Lotion  Nivea Body Creamy Conditioning Oil  Nivea Body Extra Enriched Lotion  Nivea Body Original Lotion  Nivea Body Sheer Moisturizing Lotion Nivea Crme  Nivea Skin Firming Lotion  NutraDerm 30 Skin Lotion  NutraDerm Skin Lotion  NutraDerm Therapeutic Skin Cream  NutraDerm Therapeutic Skin Lotion  ProShield Protective Hand Cream  Provon moisturizing lotion   FAILURE TO FOLLOW THESE INSTRUCTIONS MAY RESULT IN THE CANCELLATION OF YOUR SURGERY  PATIENT SIGNATURE_________________________________  NURSE SIGNATURE__________________________________  ________________________________________________________________________          Benjamen Brand    An incentive spirometer is a tool that can help keep your lungs clear and active. This tool measures how well you are filling your lungs with each breath. Taking long deep breaths may help reverse or decrease the chance of developing breathing (pulmonary) problems (especially infection) following: A long period of time when you are unable to move or be active. BEFORE THE PROCEDURE  If the spirometer includes an indicator to show your best effort, your nurse or respiratory therapist will set it to a desired goal. If possible, sit up straight or lean slightly forward. Try not to slouch. Hold the incentive spirometer in an upright position. INSTRUCTIONS FOR USE  Sit on the edge of your bed if possible, or sit up as far as you can in bed or on a chair. Hold the incentive spirometer in an upright position. Breathe out normally. Place the mouthpiece in your mouth and seal your lips tightly around it. Breathe in slowly and as deeply as possible, raising the piston or the ball toward the top of the column. Hold your breath for 3-5 seconds or for as long as  possible. Allow the piston or ball to fall to the bottom of the column. Remove the mouthpiece from your mouth and breathe out normally. Rest for a few seconds and repeat Steps 1 through 7 at least 10 times every 1-2 hours when you are awake. Take your time and take a few normal breaths between deep breaths. The spirometer may include an indicator to show your best effort. Use the indicator as a goal to work toward during each repetition. After each set of 10 deep breaths, practice coughing to be sure your lungs are clear. If you have an incision (the cut made at the time of surgery), support your incision when coughing by placing a pillow or rolled up towels firmly against it. Once you are able to get out of bed, walk around indoors and cough well. You may stop using the incentive spirometer when instructed by your caregiver.  RISKS AND COMPLICATIONS Take your time so you do  not get dizzy or light-headed. If you are in pain, you may need to take or ask for pain medication before doing incentive spirometry. It is harder to take a deep breath if you are having pain. AFTER USE Rest and breathe slowly and easily. It can be helpful to keep track of a log of your progress. Your caregiver can provide you with a simple table to help with this. If you are using the spirometer at home, follow these instructions: SEEK MEDICAL CARE IF:  You are having difficultly using the spirometer. You have trouble using the spirometer as often as instructed. Your pain medication is not giving enough relief while using the spirometer. You develop fever of 100.5 F (38.1 C) or higher.                                                                                                    SEEK IMMEDIATE MEDICAL CARE IF:  You cough up bloody sputum that had not been present before. You develop fever of 102 F (38.9 C) or greater. You develop worsening pain at or near the incision site. MAKE SURE YOU:  Understand these  instructions. Will watch your condition. Will get help right away if you are not doing well or get worse. Document Released: 11/20/2006 Document Revised: 10/02/2011 Document Reviewed: 01/21/2007 [facility] Patient Information 2014 [place], [place].       If you would like to see a video about joint replacement:   [email or web address]

## 2023-11-23 ENCOUNTER — Encounter (HOSPITAL_COMMUNITY): Payer: Self-pay

## 2023-11-23 ENCOUNTER — Encounter (HOSPITAL_COMMUNITY)
Admission: RE | Admit: 2023-11-23 | Discharge: 2023-11-23 | Disposition: A | Discharge status: Home / Self Care | Source: Ambulatory Visit | Attending: Orthopedic Surgery | Admitting: Orthopedic Surgery

## 2023-11-23 VITALS — Ht 67.0 in | Wt 326.0 lb

## 2023-11-23 DIAGNOSIS — I10 Essential (primary) hypertension: Secondary | ICD-10-CM

## 2023-11-23 DIAGNOSIS — Z79899 Other long term (current) drug therapy: Secondary | ICD-10-CM

## 2023-11-23 DIAGNOSIS — Z01818 Encounter for other preprocedural examination: Secondary | ICD-10-CM

## 2023-11-23 HISTORY — DX: Fibromyalgia: M79.7

## 2023-11-23 HISTORY — DX: Nausea with vomiting, unspecified: Z98.890

## 2023-11-23 HISTORY — DX: Sleep apnea, unspecified: G47.30

## 2023-11-23 HISTORY — DX: Other complications of anesthesia, initial encounter: T88.59XA

## 2023-11-23 HISTORY — DX: Nausea with vomiting, unspecified: R11.2

## 2023-11-26 ENCOUNTER — Encounter (HOSPITAL_COMMUNITY)
Admission: RE | Admit: 2023-11-26 | Discharge: 2023-11-26 | Disposition: A | Discharge status: Home / Self Care | Source: Ambulatory Visit | Attending: Orthopedic Surgery | Admitting: Orthopedic Surgery

## 2023-11-26 DIAGNOSIS — Z01818 Encounter for other preprocedural examination: Secondary | ICD-10-CM

## 2023-11-26 DIAGNOSIS — Z01812 Encounter for preprocedural laboratory examination: Secondary | ICD-10-CM | POA: Insufficient documentation

## 2023-11-26 DIAGNOSIS — I10 Essential (primary) hypertension: Secondary | ICD-10-CM | POA: Insufficient documentation

## 2023-11-26 DIAGNOSIS — Z79899 Other long term (current) drug therapy: Secondary | ICD-10-CM | POA: Diagnosis not present

## 2023-11-26 LAB — COMPREHENSIVE METABOLIC PANEL WITH GFR
ALT: 17 U/L (ref 0–44)
AST: 15 U/L (ref 15–41)
Albumin: 3.7 g/dL (ref 3.5–5.0)
Alkaline Phosphatase: 69 U/L (ref 38–126)
Anion gap: 8 (ref 5–15)
BUN: 15 mg/dL (ref 6–20)
CO2: 25 mmol/L (ref 22–32)
Calcium: 9.1 mg/dL (ref 8.9–10.3)
Chloride: 103 mmol/L (ref 98–111)
Creatinine, Ser: 0.58 mg/dL (ref 0.44–1.00)
GFR, Estimated: >60 mL/min (ref >60)
Glucose, Bld: 125 mg/dL — ABNORMAL HIGH (ref 70–99)
Potassium: 3.7 mmol/L (ref 3.5–5.1)
Sodium: 136 mmol/L (ref 135–145)
Total Bilirubin: 0.8 mg/dL (ref 0.0–1.2)
Total Protein: 7.2 g/dL (ref 6.5–8.1)

## 2023-11-26 LAB — SURGICAL PCR SCREEN
MRSA, PCR: NEGATIVE
Staphylococcus aureus: NEGATIVE

## 2023-11-26 LAB — CBC
HCT: 41.9 % (ref 36.0–46.0)
Hemoglobin: 14.1 g/dL (ref 12.0–15.0)
MCH: 29.2 pg (ref 26.0–34.0)
MCHC: 33.7 g/dL (ref 30.0–36.0)
MCV: 86.7 fL (ref 80.0–100.0)
Platelets: 236 10*3/uL (ref 150–400)
RBC: 4.83 MIL/uL (ref 3.87–5.11)
RDW: 12.1 % (ref 11.5–15.5)
WBC: 5.1 10*3/uL (ref 4.0–10.5)
nRBC: 0 % (ref 0.0–0.2)

## 2023-11-27 ENCOUNTER — Encounter (HOSPITAL_COMMUNITY): Payer: Self-pay | Admitting: Physician Assistant

## 2023-11-27 ENCOUNTER — Telehealth: Admitting: Adult Health

## 2023-11-27 NOTE — Progress Notes (Signed)
 Anesthesia Chart Review   Case: [id number] Date/Time: 12/06/23 0715   Procedure: ARTHROPLASTY, SHOULDER, TOTAL, REVERSE (Left: Shoulder)   Anesthesia type: Choice   Diagnosis: Complete tear of right rotator cuff, unspecified whether traumatic [M75.121]   Pre-op diagnosis: Complete tear of right rotator cuff, unspecified whether traumatic   Location: [facility] ROOM 07 / [facility]   Surgeons: Sammye Cristal, MD       DISCUSSION:52 y.o. former smoker with h/o PONV, asthma, sleep apnea, supraventricular tachycardia (SVT) that was ablated in 2002 , right rotator cuff tear scheduled for above procedure 12/06/2023 with Dr. Sammye Cristal.   Clearance received from PCP which states pt is cleared, is low risk.  Advised inhaler prior to surgery.   Per pulmonology note 10/15/2023, "Pulmonary Preoperative risk assessment for shoulder surgery   She is undergoing preoperative evaluation for left shoulder replacement Asthma is well-controlled, posing a mild surgical risk, with no significant pulmonary restrictions anticipated. She is fully independent and has a no known history of anesthesia complication.  Emphasize the need for postoperative mobility to prevent complications. Order a chest x-ray to establish a baseline before surgery. PFT pending    Major Pulmonary risks identified in the multifactorial risk analysis are but not limited to a) pneumonia; b) recurrent intubation risk; c) prolonged or recurrent acute respiratory failure needing mechanical ventilation; d) prolonged hospitalization; e) DVT/Pulmonary embolism; f) Acute Pulmonary edema   Recommend 1. Short duration of surgery as much as possible and avoid paralytic if possible  Aggressive pulmonary toilet with o2, bronchodilatation, and incentive spirometry and early ambulation"  VS: Ht 5\' 7"  (1.702 m)   Wt (!) 147.9 kg   LMP 05/25/2021   BMI 51.06 kg/m   PROVIDERS: Estill Hemming, DO is PCP   Orbie Binder, MD is Pulmonologist   LABS: Labs reviewed: Acceptable for surgery. (all labs ordered are listed, but only abnormal results are displayed)  Labs Reviewed - No data to display   IMAGES:   EKG:   CV:  Past Medical History:  Diagnosis Date   Abnormal Pap smear    Repeats WNL   ADD (attention deficit disorder)    Anxiety    Arthritis    Oseteoarthritis   Complication of anesthesia    Hard time waking up   Depression    Fibromyalgia    GERD (gastroesophageal reflux disease)    Headache(784.0)    Migraines   Junctional escape rhythm (HCC)    PONV (postoperative nausea and vomiting)    Pregnancy induced hypertension    Sleep apnea    Supraventricular tachycardia (HCC)     Past Surgical History:  Procedure Laterality Date   ABLATION OF DYSRHYTHMIC FOCUS  2002   for Ablation of SVT   APPENDECTOMY  07/24/1984   CARPAL TUNNEL RELEASE Bilateral 07/24/1998   CERVICAL FUSION  08-24-99,04-26-00,05-09-01   C6-C7  ACDF and then posterior fusions   FOOT SURGERY Right    4th toe bone removal   Knee sugery Left 1988   Arthroscopies x 2   for meniscus repair   SHOULDER SURGERY Bilateral left-6-04, right 09-12-04   tongue polyp  07/24/1982    MEDICATIONS:  albuterol  (PROVENTIL ) (2.5 MG/3ML) 0.083% nebulizer solution   albuterol  (VENTOLIN  HFA) 108 (90 Base) MCG/ACT inhaler   amphetamine -dextroamphetamine  (ADDERALL) 30 MG tablet   botulinum toxin Type A  (BOTOX ) 200 units injection   buPROPion  (WELLBUTRIN  XL) 300 MG 24 hr tablet   cetirizine (ZYRTEC) 10 MG tablet   citalopram (CELEXA) 40 MG  tablet   eletriptan  (RELPAX ) 40 MG tablet   gabapentin  (NEURONTIN ) 100 MG capsule   HYDROcodone -acetaminophen  (NORCO) 10-325 MG tablet   meclizine  (ANTIVERT ) 25 MG tablet   meloxicam  (MOBIC ) 7.5 MG tablet   mometasone  (NASONEX ) 50 MCG/ACT nasal spray   norethindrone  (INCASSIA ) 0.35 MG tablet   ondansetron  (ZOFRAN -ODT) 8 MG disintegrating tablet   tiZANidine  (ZANAFLEX ) 4 MG tablet   traMADol  (ULTRAM ) 50 MG  tablet   TRELEGY ELLIPTA  200-62.5-25 MCG/ACT AEPB   valACYclovir  (VALTREX ) 1000 MG tablet   Vitamin D , Ergocalciferol , (DRISDOL ) 1.25 MG (50000 UNIT) CAPS capsule   No current facility-administered medications for this encounter.     Chick Cotton Ward, PA-C [facility] Pre-Surgical Testing [phone]

## 2023-12-06 ENCOUNTER — Other Ambulatory Visit: Payer: Self-pay

## 2023-12-21 NOTE — Patient Instructions (Addendum)
 SURGICAL WAITING ROOM VISITATION  Patients having surgery or a procedure may have no more than 2 support people in the waiting area - these visitors may rotate.    Children under the age of 94 must have an adult with them who is not the patient.   Visitors with respiratory illnesses are discouraged from visiting and should remain at home.  If the patient needs to stay at the hospital during part of their recovery, the visitor guidelines for inpatient rooms apply. Pre-op nurse will coordinate an appropriate time for 1 support person to accompany patient in pre-op.  This support person may not rotate.    Please refer to the [facility] website for the visitor guidelines for Inpatients (after your surgery is over and you are in a regular room).       Your procedure is scheduled on: 01-03-24    Report to [facility] Main Entrance    Report to admitting at       0730  AM   Call this number if you have problems the morning of surgery [phone]   Do not eat food :After Midnight.   After Midnight you may have the following liquids until _0700_____ AM/  DAY OF SURGERY  then nothing by mouth  Water Non-Citrus Juices (without pulp, NO RED-Apple, White grape, White cranberry) Black Coffee (NO MILK/CREAM OR CREAMERS, sugar ok)  Clear Tea (NO MILK/CREAM OR CREAMERS, sugar ok) regular and decaf                             Plain Jell-O (NO RED)                                           Fruit ices (not with fruit pulp, NO RED)                                     Popsicles (NO RED)                                                               Sports drinks like Gatorade (NO RED)                      The day of surgery:  Drink ONE (1) Pre-Surgery Clear Ensure BY  0700 AM the morning of surgery. Drink in one sitting. Do not sip.  This drink was given to you during your hospital  pre-op appointment visit. Nothing else to drink after completing the  Pre-Surgery Clear Ensure .           If you have questions, please contact your surgeon's office.   FOLLOW ANY ADDITIONAL PRE OP INSTRUCTIONS YOU RECEIVED FROM YOUR SURGEON'S OFFICE!!!     Oral Hygiene is also important to reduce your risk of infection.                                    Remember - BRUSH YOUR TEETH THE  MORNING OF SURGERY WITH YOUR REGULAR TOOTHPASTE  DENTURES WILL BE REMOVED PRIOR TO SURGERY PLEASE DO NOT APPLY "Poly grip" OR ADHESIVES!!!   Do NOT smoke after Midnight   Stop all vitamins and herbal supplements 7 days before surgery.   Take these medicines the morning of surgery with A SIP OF WATER: Inhaler bring rescue inhaler with you, tramadol  and nebulizer if needed, norethindrone , gabapentin , meclizine  if needed, Celexa, zyrtec, Wellbutrin ,     Bring CPAP mask and tubing day of surgery.                              You may not have any metal on your body including hair pins, jewelry, and body piercing             Do not wear make-up, lotions, powders, perfumes/cologne, or deodorant  Do not wear nail polish including gel and S&S, artificial/acrylic nails, or any other type of covering on natural nails including finger and toenails. If you have artificial nails, gel coating, etc. that needs to be removed by a nail salon please have this removed prior to surgery or surgery may need to be canceled/ delayed if the surgeon/ anesthesia feels like they are unable to be safely monitored.   Do not shave  5 days prior to surgery.            Do not bring valuables to the hospital. [facility] IS NOT             RESPONSIBLE   FOR VALUABLES.   Contacts, glasses, dentures or bridgework may not be worn into surgery.   Bring small overnight bag day of surgery.   DO NOT BRING YOUR HOME MEDICATIONS TO THE HOSPITAL. PHARMACY WILL DISPENSE MEDICATIONS LISTED ON YOUR MEDICATION LIST TO YOU DURING YOUR ADMISSION IN THE HOSPITAL!    Patients discharged on the day of surgery will not be allowed to drive home.   Someone NEEDS to stay with you for the first 24 hours after anesthesia.   Special Instructions: Bring a copy of your healthcare power of attorney and living will documents the day of surgery if you haven't scanned them before.              Please read over the following fact sheets you were given: IF YOU HAVE QUESTIONS ABOUT YOUR PRE-OP INSTRUCTIONS PLEASE CALL [phone]    If you test positive for Covid or have been in contact with anyone that has tested positive in the last 10 days please notify you surgeon.   [facility]- Preparing for Total Shoulder Arthroplasty    Before surgery, you can play an important role. Because skin is not sterile, your skin needs to be as free of germs as possible. You can reduce the number of germs on your skin by using the following products. Benzoyl Peroxide Gel Reduces the number of germs present on the skin Applied twice a day to shoulder area starting two days before surgery    ==================================================================  Please follow these instructions carefully:  BENZOYL PEROXIDE 5% GEL  Please do not use if you have an allergy to benzoyl peroxide.   If your skin becomes reddened/irritated stop using the benzoyl peroxide.  Starting two days before surgery, apply as follows: Apply benzoyl peroxide in the morning and at night. Apply after taking a shower. If you are not taking a shower clean entire shoulder front, back, and side along with the  armpit with a clean wet washcloth.  Place a quarter-sized dollop on your shoulder and rub in thoroughly, making sure to cover the front, back, and side of your shoulder, along with the armpit.   2 days before ____ AM   ____ PM              1 day before ____ AM   ____ PM                         Do this twice a day for two days.  (Last application is the night before surgery, AFTER using the CHG soap as described below).  Do NOT apply benzoyl peroxide gel on the day of surgery.       Pre-operative 5 CHG Bath Instructions   You can play a key role in reducing the risk of infection after surgery. Your skin needs to be as free of germs as possible. You can reduce the number of germs on your skin by washing with CHG (chlorhexidine gluconate) soap before surgery. CHG is an antiseptic soap that kills germs and continues to kill germs even after washing.   DO NOT use if you have an allergy to chlorhexidine/CHG or antibacterial soaps. If your skin becomes reddened or irritated, stop using the CHG and notify one of our RNs at [phone].   Please shower with the CHG soap starting 4 days before surgery using the following schedule:     Please keep in mind the following:  DO NOT shave, including legs and underarms, starting the day of your first shower.   You may shave your face at any point before/day of surgery.  Place clean sheets on your bed the day you start using CHG soap. Use a clean washcloth (not used since being washed) for each shower. DO NOT sleep with pets once you start using the CHG.   CHG Shower Instructions:  If you choose to wash your hair and private area, wash first with your normal shampoo/soap.  After you use shampoo/soap, rinse your hair and body thoroughly to remove shampoo/soap residue.  Turn the water OFF and apply about 3 tablespoons (45 ml) of CHG soap to a CLEAN washcloth.  Apply CHG soap ONLY FROM YOUR NECK DOWN TO YOUR TOES (washing for 3-5 minutes)  DO NOT use CHG soap on face, private areas, open wounds, or sores.  Pay special attention to the area where your surgery is being performed.  If you are having back surgery, having someone wash your back for you may be helpful. Wait 2 minutes after CHG soap is applied, then you may rinse off the CHG soap.  Pat dry with a clean towel  Put on clean clothes/pajamas   If you choose to wear lotion, please use ONLY the CHG-compatible lotions on the back of this paper.     Additional instructions for  the day of surgery: DO NOT APPLY any lotions, deodorants, cologne, or perfumes.   Put on clean/comfortable clothes.  Brush your teeth.  Ask your nurse before applying any prescription medications to the skin.      CHG Compatible Lotions   Aveeno Moisturizing lotion  Cetaphil Moisturizing Cream  Cetaphil Moisturizing Lotion  Clairol Herbal Essence Moisturizing Lotion, Dry Skin  Clairol Herbal Essence Moisturizing Lotion, Extra Dry Skin  Clairol Herbal Essence Moisturizing Lotion, Normal Skin  Curel Age Defying Therapeutic Moisturizing Lotion with Alpha Hydroxy  Curel Extreme Care Body Lotion  Curel Soothing Hands Moisturizing  Hand Lotion  Curel Therapeutic Moisturizing Cream, Fragrance-Free  Curel Therapeutic Moisturizing Lotion, Fragrance-Free  Curel Therapeutic Moisturizing Lotion, Original Formula  Eucerin Daily Replenishing Lotion  Eucerin Dry Skin Therapy Plus Alpha Hydroxy Crme  Eucerin Dry Skin Therapy Plus Alpha Hydroxy Lotion  Eucerin Original Crme  Eucerin Original Lotion  Eucerin Plus Crme Eucerin Plus Lotion  Eucerin TriLipid Replenishing Lotion  Keri Anti-Bacterial Hand Lotion  Keri Deep Conditioning Original Lotion Dry Skin Formula Softly Scented  Keri Deep Conditioning Original Lotion, Fragrance Free Sensitive Skin Formula  Keri Lotion Fast Absorbing Fragrance Free Sensitive Skin Formula  Keri Lotion Fast Absorbing Softly Scented Dry Skin Formula  Keri Original Lotion  Keri Skin Renewal Lotion Keri Silky Smooth Lotion  Keri Silky Smooth Sensitive Skin Lotion  Nivea Body Creamy Conditioning Oil  Nivea Body Extra Enriched Lotion  Nivea Body Original Lotion  Nivea Body Sheer Moisturizing Lotion Nivea Crme  Nivea Skin Firming Lotion  NutraDerm 30 Skin Lotion  NutraDerm Skin Lotion  NutraDerm Therapeutic Skin Cream  NutraDerm Therapeutic Skin Lotion  ProShield Protective Hand Cream  Provon moisturizing lotion   Incentive Spirometer  An incentive  spirometer is a tool that can help keep your lungs clear and active. This tool measures how well you are filling your lungs with each breath. Taking long deep breaths may help reverse or decrease the chance of developing breathing (pulmonary) problems (especially infection) following: A long period of time when you are unable to move or be active. BEFORE THE PROCEDURE  If the spirometer includes an indicator to show your best effort, your nurse or respiratory therapist will set it to a desired goal. If possible, sit up straight or lean slightly forward. Try not to slouch. Hold the incentive spirometer in an upright position. INSTRUCTIONS FOR USE  Sit on the edge of your bed if possible, or sit up as far as you can in bed or on a chair. Hold the incentive spirometer in an upright position. Breathe out normally. Place the mouthpiece in your mouth and seal your lips tightly around it. Breathe in slowly and as deeply as possible, raising the piston or the ball toward the top of the column. Hold your breath for 3-5 seconds or for as long as possible. Allow the piston or ball to fall to the bottom of the column. Remove the mouthpiece from your mouth and breathe out normally. Rest for a few seconds and repeat Steps 1 through 7 at least 10 times every 1-2 hours when you are awake. Take your time and take a few normal breaths between deep breaths. The spirometer may include an indicator to show your best effort. Use the indicator as a goal to work toward during each repetition. After each set of 10 deep breaths, practice coughing to be sure your lungs are clear. If you have an incision (the cut made at the time of surgery), support your incision when coughing by placing a pillow or rolled up towels firmly against it. Once you are able to get out of bed, walk around indoors and cough well. You may stop using the incentive spirometer when instructed by your caregiver.  RISKS AND COMPLICATIONS Take your time  so you do not get dizzy or light-headed. If you are in pain, you may need to take or ask for pain medication before doing incentive spirometry. It is harder to take a deep breath if you are having pain. AFTER USE Rest and breathe slowly and easily. It can be helpful to  keep track of a log of your progress. Your caregiver can provide you with a simple table to help with this. If you are using the spirometer at home, follow these instructions: SEEK MEDICAL CARE IF:  You are having difficultly using the spirometer. You have trouble using the spirometer as often as instructed. Your pain medication is not giving enough relief while using the spirometer. You develop fever of 100.5 F (38.1 C) or higher. SEEK IMMEDIATE MEDICAL CARE IF:  You cough up bloody sputum that had not been present before. You develop fever of 102 F (38.9 C) or greater. You develop worsening pain at or near the incision site. MAKE SURE YOU:  Understand these instructions. Will watch your condition. Will get help right away if you are not doing well or get worse. Document Released: 11/20/2006 Document Revised: 10/02/2011 Document Reviewed: 01/21/2007 [facility] Patient Information 2014 [place], [place].   ________________________________________________________________________

## 2023-12-21 NOTE — Progress Notes (Addendum)
 PCP - Roel Clarity, MD Cardiologist -  P[facility]m- [facility], [facility] Neuro-  PPM/ICD -  Device Orders -  Rep Notified -   Chest x-ray - 10-25-23 epic EKG - 09-11-23 epic Stress Test -  ECHO -  Cardiac Cath -   Sleep Study -  CPAP -   Fasting Blood Sugar -  Checks Blood Sugar _____ times a day  Blood Thinner Instructions: Aspirin Instructions:  ERAS Protcol - PRE-SURGERY Ensure    COVID vaccine -  Activity-- Anesthesia review: SVT with ablation 2002, OSA, Junctional escape rhythm,  Patient denies shortness of breath, fever, cough and chest pain at PAT appointment   All instructions explained to the patient, with a verbal understanding of the material. Patient agrees to go over the instructions while at home for a better understanding. Patient also instructed to self quarantine after being tested for COVID-19. The opportunity to ask questions was provided.

## 2023-12-26 ENCOUNTER — Encounter (HOSPITAL_COMMUNITY)
Admission: RE | Admit: 2023-12-26 | Discharge: 2023-12-26 | Disposition: A | Discharge status: Home / Self Care | Source: Ambulatory Visit | Attending: Orthopedic Surgery | Admitting: Orthopedic Surgery

## 2023-12-26 DIAGNOSIS — I10 Essential (primary) hypertension: Secondary | ICD-10-CM

## 2023-12-26 DIAGNOSIS — Z01818 Encounter for other preprocedural examination: Secondary | ICD-10-CM

## 2024-01-01 ENCOUNTER — Other Ambulatory Visit: Payer: Self-pay | Admitting: Family Medicine

## 2024-01-01 ENCOUNTER — Encounter: Payer: Self-pay | Admitting: Family Medicine

## 2024-01-01 DIAGNOSIS — J4541 Moderate persistent asthma with (acute) exacerbation: Secondary | ICD-10-CM

## 2024-01-01 MED ORDER — AZITHROMYCIN 250 MG PO TABS: ORAL_TABLET | ORAL | 0 refills | Status: DC

## 2024-01-01 MED ORDER — PREDNISONE 10 MG PO TABS: ORAL_TABLET | ORAL | 0 refills | Status: DC

## 2024-01-03 ENCOUNTER — Encounter (HOSPITAL_COMMUNITY): Admission: RE | Payer: Self-pay | Source: Ambulatory Visit

## 2024-01-03 ENCOUNTER — Ambulatory Visit (HOSPITAL_COMMUNITY): Admission: RE | Admit: 2024-01-03 | Source: Ambulatory Visit | Admitting: Orthopedic Surgery

## 2024-01-03 SURGERY — ARTHROPLASTY, SHOULDER, TOTAL, REVERSE
Anesthesia: Choice | Site: Shoulder | Laterality: Left

## 2024-02-28 ENCOUNTER — Other Ambulatory Visit: Payer: Self-pay

## 2024-02-28 ENCOUNTER — Other Ambulatory Visit: Payer: Self-pay | Admitting: Neurology

## 2024-02-29 ENCOUNTER — Other Ambulatory Visit (HOSPITAL_COMMUNITY): Payer: Self-pay

## 2024-03-03 ENCOUNTER — Other Ambulatory Visit: Payer: Self-pay

## 2024-03-03 ENCOUNTER — Encounter: Payer: Self-pay | Admitting: Family Medicine

## 2024-03-04 NOTE — Telephone Encounter (Signed)
 Printed and placed in folder

## 2024-03-07 ENCOUNTER — Other Ambulatory Visit: Payer: Self-pay | Admitting: Family Medicine

## 2024-03-07 DIAGNOSIS — M62838 Other muscle spasm: Secondary | ICD-10-CM

## 2024-03-07 DIAGNOSIS — R110 Nausea: Secondary | ICD-10-CM

## 2024-03-08 ENCOUNTER — Encounter: Payer: Self-pay | Admitting: Neurology

## 2024-03-12 ENCOUNTER — Other Ambulatory Visit (HOSPITAL_COMMUNITY): Payer: Self-pay

## 2024-03-12 NOTE — Progress Notes (Unsigned)
 NEUROLOGY FOLLOW UP OFFICE NOTE  Jo Jackson [id number]  Assessment/Plan:   Migraine without aura, without status migrainosus, not intractable Cervicogenic headache    Migraine prevention:  Botox  Migraine rescue:  Eletriptan  40mg ;  Zofran  for nausea. Limit use of pain relievers to no more than 2 days out of week to prevent risk of rebound or medication-overuse headache. Keep headache diary Follow up for Botox  Follow up one year for routine checkup (or as needed)       Subjective:  Jo Jackson is a 53 year old right-handed female with asthma, ADD, depression and migraine who for migraines  UPDATE: Typical migraines: Duration:  less than an hour with eletriptan  Frequency:  1 to 2 every 2 months.    Cervicogenic migraines: Wakes up with it (right occipital region radiating up to the front of head) Duration:  less than an hour with eletriptan  Frequency:  2-4 times a week pending weather.    Current abortive management:  eletriptan  Current NSAIDS/analgesics:  hydrocodone -acetaminophen , tramadol  Current triptans:  eletriptan  40mg  Current ergotamine:  none Current anti-emetic:  Zofran  ODT 8mg  Current muscle relaxants:  tizanidine  4mg  PRN Current Antihypertensive medications:  none Current Antidepressant medications:  Trintellix  Current Anticonvulsant medications:  none Current anti-CGRP:  none Current Vitamins/Herbal/Supplements:  none Current Antihistamines/Decongestants:  meclizine  Other therapy:  none Hormone/birth control:  none Other medications:  Adderall, albuterol     Caffeine:  Sweet tea Alcohol:  occasionally Smoker:  no Diet:  Needs to hydrate more Exercise:  Not routine Depression/stress:  better Sleep hygiene:  varies Other pain:  fibromyalgia. Family history of headache:  mom  HISTORY:  Onset:  She has had menstrually-related migraines since age 9, but they have been related to the neck since repeated cervical spinal fusions at  C6-C7 over 10 years ago, as demonstrated on CT report from 2003. Location:  She develops spasm in the right trapezius which triggers pain in the back of head on the right and radiates to the right temple/eye Quality:  Squeezing, throbbing Intensity:  7/10 Aura:  no Prodrome:  no Associated symptoms:  Both eyes hurt with or without headache.  Photophobia, phonophobia.  Occasional nausea.  No visual disturbance. Duration:  1 hour with medication but can last 2-3 days approximately 1 to 2 times a month Frequency:  During summer months, 5 days a week.  Otherwise, 3 a month Triggers/exacerbating factors:  Previously, it was her period (until she started taking Depo shot), now neck spasms Relieving factors:  Rest, tizanidine  Activity:  functions   History of cervical fusion.  08/02/2001 CT C-SPINE:  1.  ANTERIOR AND POSTERIOR FUSION AT C6-7 WITH FRACTURE OF THE SCREWS AT C6.  NO CHANGE IN ALIGNMENT WITH APPARENT FUSION AT THE LEVEL OF THE INTERBODY FUSION PLUG. 2.  QUESTIONABLE BULGING ANNULUS AND/OR HERNIATED DISC CENTRALLY  AND TO THE LEFT AT THE C7-T1 LEVEL.  IF THE PRIOR STUDIES CANNOT BE LOCATED FOR COMPARISON,  THEN CT MYELOGRAPHY MAY BE  INDICATED TO ASSESS THIS AREA FURTHER. CT MULTIPLANAR RECONSTRUCTION.  THE AXIAL SCANS WERE RECONSTRUCTED IN BOTH SAGITTAL AND CORONAL PLAINS.  THERE DOES APPEAR TO BE  GOOD BONY FUSION AT THE LEVEL OF THE INTERBODY FUSION PLUG AT C6-7, BUT THE REMAINDER OF THE INTERVERTEBRAL SPACE DOES NOT APPEAR TO BE SOLIDLY FUSED.  NORMAL ALIGNMENT IS MAINTAINED IN THE  SAGITALLY RECONSTRUCTED IMAGES.  09/28/2016 MRI C-SPINE:  1.  Anterior and posterior fusion at C6-7 appears solid with no complication or residual canal stenosis in this region. 2.  Multilevel disc and facet degenerative change in the upper/mid cervical spine as detailed. Pertinent levels below. 3.  At C3-4 there is mild to moderate canal stenosis with high-grade right greater than left foraminal stenosis. 4.  At  C4-5 there is moderate/severe right foraminal stenosis.    Past NSAIDS/analgesics:  meloxicam , Tylenol , ibuprofen  800mg  Past abortive triptans:  sumatriptan  [place]/NS, rizatriptan  Past abortive ergotamine:  possibly Past muscle relaxants:  cyclobenzaprine , methocarbamol, Soma Past anti-emetic:  none Past antihypertensive medications:  propranolol, metoprolol  Past antidepressant medications:  venlafaxine , duloxetine , Viibryd  Past anticonvulsant medications:  topiramate Past anti-CGRP:  Ubrelvy  Past vitamins/Herbal/Supplements:  none Past antihistamines/decongestants:  Nasonex  Other past therapies:  cervical epidural injections    PAST MEDICAL HISTORY: Past Medical History:  Diagnosis Date   Abnormal Pap smear    Repeats WNL   ADD (attention deficit disorder)    Anxiety    Arthritis    Oseteoarthritis   Complication of anesthesia    Hard time waking up   Depression    Fibromyalgia    GERD (gastroesophageal reflux disease)    Headache(784.0)    Migraines   Junctional escape rhythm (HCC)    PONV (postoperative nausea and vomiting)    Pregnancy induced hypertension    Sleep apnea    Supraventricular tachycardia (HCC)     MEDICATIONS: Current Outpatient Medications on File Prior to Visit  Medication Sig Dispense Refill   albuterol  (PROVENTIL ) (2.5 MG/3ML) 0.083% nebulizer solution Take 3 mLs (2.5 mg total) by nebulization every 6 (six) hours as needed for wheezing or shortness of breath. 75 mL 12   albuterol  (VENTOLIN  HFA) 108 (90 Base) MCG/ACT inhaler Inhale 2 puffs into the lungs every 6 (six) hours as needed for wheezing or shortness of breath. 18 g 5   amphetamine -dextroamphetamine  (ADDERALL) 30 MG tablet Take 1 tablet by mouth 2 (two) times daily. 180 tablet 0   azithromycin  (ZITHROMAX  Z-PAK) 250 MG tablet As directed 6 each 0   botulinum toxin Type A  (BOTOX ) 200 units injection Inject 200 Units into the muscle every 3 (three) months. Inject 155 units IM into multiple site in  the face,neck and head once every 90 days 1 each 4   buPROPion  (WELLBUTRIN  XL) 300 MG 24 hr tablet Take 1 tablet (300 mg total) by mouth daily. 30 tablet 2   cetirizine (ZYRTEC) 10 MG tablet Take 10 mg by mouth daily.     citalopram (CELEXA) 40 MG tablet Take 1 tablet (40 mg total) by mouth daily. 30 tablet 3   eletriptan  (RELPAX ) 40 MG tablet Take 1 tablet (40 mg total) by mouth as needed for migraine or headache. May repeat after 2 hours.  Maximum 2 tablets in 24 hours. 10 tablet 5   gabapentin  (NEURONTIN ) 100 MG capsule Take 1 capsule (100 mg total) by mouth 2 (two) times daily. 60 capsule 3   HYDROcodone -acetaminophen  (NORCO) 10-325 MG tablet Take 1 tablet by mouth every 6 (six) hours as needed for severe pain (pain score 7-10). 30 tablet 0   meclizine  (ANTIVERT ) 25 MG tablet TAKE 1 TABLET BY MOUTH 3 TIMES DAILY AS NEEDED FOR DIZZINESS. 30 tablet 0   meloxicam  (MOBIC ) 7.5 MG tablet TAKE 1 TO 2 TABLETS DAILY AS NEEDED FOR PAIN 180 tablet 2   mometasone  (NASONEX ) 50 MCG/ACT nasal spray Place 2 sprays into the nose daily. (Patient taking differently: Place 2 sprays into the nose daily as needed (Allergies).) 51 each 4   norethindrone  (INCASSIA ) 0.35 MG tablet Take 1 tablet (0.35 mg  total) by mouth daily. 28 tablet 11   ondansetron  (ZOFRAN -ODT) 8 MG disintegrating tablet TAKE 1 TABLET BY MOUTH EVERY 8 HOURS AS NEEDED FOR NAUSEA OR VOMITING. 20 tablet 1   predniSONE  (DELTASONE ) 10 MG tablet TAKE 3 TABLETS PO QD FOR 3 DAYS THEN TAKE 2 TABLETS PO QD FOR 3 DAYS THEN TAKE 1 TABLET PO QD FOR 3 DAYS THEN TAKE 1/2 TAB PO QD FOR 3 DAYS 20 tablet 0   tiZANidine  (ZANAFLEX ) 4 MG tablet TAKE 1 TABLET BY MOUTH EVERY 6 HOURS AS NEEDED FOR MUSCLE SPASMS. 90 tablet 1   traMADol  (ULTRAM ) 50 MG tablet TAKE 1 TABLET (50 MG TOTAL) BY MOUTH EVERY 8 (EIGHT) HOURS AS NEEDED (MOD PAIN). 30 tablet 0   TRELEGY ELLIPTA  200-62.5-25 MCG/ACT AEPB INHALE 1 PUFF BY MOUTH EVERY DAY 180 each 1   valACYclovir  (VALTREX ) 1000 MG tablet  TAKE 1 TABLET BY MOUTH THREE TIMES A DAY 30 tablet 2   Vitamin D , Ergocalciferol , (DRISDOL ) 1.25 MG (50000 UNIT) CAPS capsule TAKE 1 CAPSULE (50,000 UNITS TOTAL) BY MOUTH EVERY 7 (SEVEN) DAYS 12 capsule 1   No current facility-administered medications on file prior to visit.    ALLERGIES: Allergies  Allergen Reactions   Ketorolac Tromethamine Hives   Morphine Other (See Comments)    makes me the devil.   Seasonal Ic [Octacosanol]     Seasonal Allergies    Latex Rash and Other (See Comments)    Latex catheter caused extreme irritation    FAMILY HISTORY: Family History  Problem Relation Age of Onset   Migraines Mother    COPD Mother    Heart disease Mother        chf   Depression Mother    Hodgkin's lymphoma Father    Breast cancer Sister    ADD / ADHD Brother    Cancer Cousin    Diabetes Other    Thyroid disease Other    Hypertension Other    Depression Other    Bipolar disorder Other       Objective:  *** General: No acute distress.  Patient appears well-groomed.   Head:  Normocephalic/atraumatic Neck:  Supple.  No paraspinal tenderness.  Full range of motion. Heart:  Regular rate and rhythm. Neuro:  Alert and oriented.  Speech fluent and not dysarthric.  Language intact.  CN II-XII intact.  Bulk and tone normal.  Muscle strength 5/5 throughout.  Sensation to light touch intact.  Deep tendon reflexes 2+ throughout, toes downgoing.  Gait normal.  Romberg negative.     Juliene Dunnings, DO  CC: Jamee Antonio Meth, DO

## 2024-03-14 ENCOUNTER — Telehealth: Payer: Self-pay

## 2024-03-14 ENCOUNTER — Ambulatory Visit (INDEPENDENT_AMBULATORY_CARE_PROVIDER_SITE_OTHER): Payer: BC Managed Care – PPO | Admitting: Neurology

## 2024-03-14 ENCOUNTER — Ambulatory Visit: Admitting: Family Medicine

## 2024-03-14 ENCOUNTER — Encounter: Payer: Self-pay | Admitting: Neurology

## 2024-03-14 VITALS — BP 148/83 | HR 79 | Ht 67.0 in | Wt 334.0 lb

## 2024-03-14 DIAGNOSIS — M62838 Other muscle spasm: Secondary | ICD-10-CM

## 2024-03-14 DIAGNOSIS — G43709 Chronic migraine without aura, not intractable, without status migrainosus: Secondary | ICD-10-CM | POA: Diagnosis not present

## 2024-03-14 MED ORDER — ELETRIPTAN HYDROBROMIDE 40 MG PO TABS: 40.0000 mg | ORAL_TABLET | ORAL | 5 refills | Status: AC | PRN

## 2024-03-14 MED ORDER — TIZANIDINE HCL 4 MG PO TABS: 4.0000 mg | ORAL_TABLET | Freq: Three times a day (TID) | ORAL | 5 refills | Status: AC | PRN

## 2024-03-14 NOTE — Telephone Encounter (Signed)
 Pr patient message received from [facility] PA needed for Eletriptan .

## 2024-03-14 NOTE — Patient Instructions (Addendum)
 Plan to restart Botox  Refilled eletriptan  Consider trying Trudhesa nasal spray - prime (4 pumps) then 1 spray in each nostril.  (Do not take within 24 hours of eletriptan ) Tizanidine  for neck pain as needed

## 2024-03-14 NOTE — Telephone Encounter (Signed)
 Patient seen today, Patient to restart Botox  200 units every 90 days.

## 2024-03-16 ENCOUNTER — Encounter: Payer: Self-pay | Admitting: Neurology

## 2024-03-16 DIAGNOSIS — G4486 Cervicogenic headache: Secondary | ICD-10-CM

## 2024-03-16 DIAGNOSIS — M542 Cervicalgia: Secondary | ICD-10-CM

## 2024-03-16 DIAGNOSIS — G43709 Chronic migraine without aura, not intractable, without status migrainosus: Secondary | ICD-10-CM

## 2024-03-17 ENCOUNTER — Other Ambulatory Visit (HOSPITAL_COMMUNITY): Payer: Self-pay

## 2024-03-17 MED ORDER — PREDNISONE 10 MG (21) PO TBPK: ORAL_TABLET | ORAL | 0 refills | Status: DC

## 2024-03-17 NOTE — Telephone Encounter (Signed)
 Contacted pharmacy as says filled 8.22. Patient picked up on 8.23. No PA required.

## 2024-03-17 NOTE — Telephone Encounter (Signed)
 Taper sent and note sent via mychart.

## 2024-03-17 NOTE — Telephone Encounter (Signed)
 OK to provide note.  Offer prednisone  taper and please send in prescription if patient agrees

## 2024-03-18 ENCOUNTER — Other Ambulatory Visit (HOSPITAL_COMMUNITY): Payer: Self-pay

## 2024-03-18 NOTE — Telephone Encounter (Signed)
 Submitted updated Botox  One report- [id number].   Patient still has active Botox  200 unit PA through 08/2024. Ran test claim, pharmacy copay for 84 day supply is $4.00. Can fill through [facility].

## 2024-03-21 ENCOUNTER — Ambulatory Visit: Admitting: Family Medicine

## 2024-03-25 ENCOUNTER — Encounter: Payer: Self-pay | Admitting: Family Medicine

## 2024-03-25 ENCOUNTER — Other Ambulatory Visit (HOSPITAL_COMMUNITY): Payer: Self-pay

## 2024-03-25 ENCOUNTER — Telehealth: Payer: Self-pay | Admitting: Pharmacy Technician

## 2024-03-25 ENCOUNTER — Other Ambulatory Visit: Payer: Self-pay | Admitting: Neurology

## 2024-03-25 ENCOUNTER — Other Ambulatory Visit: Payer: Self-pay

## 2024-03-25 ENCOUNTER — Encounter: Payer: Self-pay | Admitting: Neurology

## 2024-03-25 NOTE — Telephone Encounter (Signed)
 PA not needed. Needs refill sent to [facility].

## 2024-03-26 ENCOUNTER — Other Ambulatory Visit: Payer: Self-pay

## 2024-03-26 ENCOUNTER — Other Ambulatory Visit: Payer: Self-pay | Admitting: Pharmacy Technician

## 2024-03-26 ENCOUNTER — Encounter (INDEPENDENT_AMBULATORY_CARE_PROVIDER_SITE_OTHER): Payer: Self-pay

## 2024-03-26 MED ORDER — BOTOX 200 UNITS IJ SOLR
200.0000 [IU] | INTRAMUSCULAR | 4 refills | Status: DC
Start: 2024-03-26 — End: 2024-05-26
  Filled 2024-03-26 – 2024-03-27 (×2): qty 1, 90d supply, fill #0

## 2024-03-26 NOTE — Telephone Encounter (Signed)
 MRI order has [facility]ed [facility].

## 2024-03-26 NOTE — Progress Notes (Signed)
 Specialty Pharmacy Refill Coordination Note  Jo Jackson is a 53 y.o. female contacted today regarding refills of specialty medication(s) Botox   Patient requested (Patient-Rptd) Delivery   Delivery date: 03/31/24 Verified address: (Patient-Rptd) [address]? Dr Jayme office at [facility].  [facility] [address]  Medication will be filled on 03/28/24.   RR sent to MD.

## 2024-03-27 ENCOUNTER — Other Ambulatory Visit: Payer: Self-pay

## 2024-03-27 ENCOUNTER — Other Ambulatory Visit (HOSPITAL_COMMUNITY): Payer: Self-pay

## 2024-03-27 ENCOUNTER — Ambulatory Visit: Admitting: Medical

## 2024-03-27 VITALS — BP 124/90 | HR 98 | Temp 98.2°F | Resp 16 | Ht 67.0 in | Wt 335.8 lb

## 2024-03-27 DIAGNOSIS — R051 Acute cough: Secondary | ICD-10-CM

## 2024-03-27 DIAGNOSIS — J0100 Acute maxillary sinusitis, unspecified: Secondary | ICD-10-CM

## 2024-03-27 DIAGNOSIS — R0989 Other specified symptoms and signs involving the circulatory and respiratory systems: Secondary | ICD-10-CM | POA: Diagnosis not present

## 2024-03-27 DIAGNOSIS — J4540 Moderate persistent asthma, uncomplicated: Secondary | ICD-10-CM | POA: Diagnosis not present

## 2024-03-27 MED ORDER — CEFDINIR 300 MG PO CAPS: 300.0000 mg | ORAL_CAPSULE | Freq: Two times a day (BID) | ORAL | 0 refills | Status: DC

## 2024-03-27 MED ORDER — BENZONATATE 100 MG PO CAPS: 100.0000 mg | ORAL_CAPSULE | Freq: Three times a day (TID) | ORAL | 0 refills | Status: DC | PRN

## 2024-03-27 MED ORDER — FLUCONAZOLE 150 MG PO TABS: 150.0000 mg | ORAL_TABLET | Freq: Every day | ORAL | 0 refills | Status: DC

## 2024-03-27 NOTE — Patient Instructions (Addendum)
 Acute maxillary sinusitis with acute cough and allergic rhinitis Symptoms consistent with acute maxillary sinusitis. History of recurrent sinus infections. Current episode includes acute cough and potential allergic rhinitis. yeast infection. - Prescribe cefdinir  600 mg twice a day for 10 days. - Prescribe Diflucan  one tablet for one day, with instructions to use around day five if needed yeast infection and to request a refill on day ten if necessary. - Prescribe Tessalon  Perles for cough management. - Advise continuation of mometasone  (Nasonex ) nasal spray. - Advise continuation of cetirizine for potential allergy component.  Asthma(moderate persistent in past). No acute flare presently. Asthma with intermittent bronchitis and wheezing, often triggered by sinus infections and chest congestion. Reports slight chest tightness, indicating potential wheezing onset. Requires Trelegy refill due to insurance lapse. Plan includes monitoring for wheezing and using inhalers as needed, with conditional steroid treatment if symptoms worsen. - Advise use of albuterol  as needed for wheezing. - Advise obtaining a refill of Trelegy. - Instruct to contact the clinic if wheezing occurs despite inhaler use, for potential Medrol  taper prescription.  Follow up 7-10 days or sooner if needed

## 2024-03-27 NOTE — Telephone Encounter (Signed)
 Yes. If processed under primary insurance only, co-pay is $5.00 When processed under both insurances, co-pay is $4.00

## 2024-03-27 NOTE — Progress Notes (Signed)
 Subjective:    Patient ID: Jo Jackson, female    DOB: 1970/12/24, 53 y.o.   MRN: [id number]  HPI  Jo Jackson is a 53 year old female with asthma and bronchitis who presents with sinus congestion and cough.  She experiences sinus congestion and cough, beginning with a dry sensation at the top of the back of her soft palate and in her sinuses. Within 24 hours, her throat becomes coated and her sinuses hurt more, leading to a thick sensation in her throat. This often progresses to chest congestion. She experiences these symptoms approximately twice a year, often around December, coinciding with peak season at her workplace, [employer]. She recalls missing an entire week of work due to severe illness during this time in the past.  She has a history of bronchitis and asthma, with intermittent wheezing and reactive airways. She underwent pulmonary function tests in March, April, or May, conducted by a pulmonologist, [facility]. She uses albuterol  and Trelegy for her asthma management, although she has been without medical insurance for six months, affecting her medication access.  Currently, she has not experienced severe body aches, chills, or sweats, and her daughter tested negative for flu, strep, and COVID. Her symptoms feel similar to her previous episodes, and she does not report any new symptoms beyond what she has experienced before.  She has not yet taken any medication for her current cough but finds Tessalon  Perles effective. She uses mometasone  nasal spray and cetirizine for allergy management. She also mentions a remote history of smoking, which she has since quit.    Review of Systems  Constitutional:  Negative for chills, fatigue and fever.  HENT:  Positive for congestion. Negative for ear discharge and facial swelling.   Respiratory:  Negative for chest tightness, shortness of breath and wheezing.        Mild chest congestion  Cardiovascular:  Negative for chest pain and  palpitations.  Gastrointestinal:  Negative for abdominal pain.  Genitourinary:  Negative for dyspareunia, dysuria and frequency.  Skin:  Negative for rash.  Psychiatric/Behavioral:  Negative for behavioral problems.     Past Medical History:  Diagnosis Date   Abnormal Pap smear    Repeats WNL   ADD (attention deficit disorder)    Anxiety    Arthritis    Oseteoarthritis   Complication of anesthesia    Hard time waking up   Depression    Fibromyalgia    GERD (gastroesophageal reflux disease)    Headache(784.0)    Migraines   Junctional escape rhythm (HCC)    PONV (postoperative nausea and vomiting)    Pregnancy induced hypertension    Sleep apnea    Supraventricular tachycardia (HCC)      Social History   Socioeconomic History   Marital status: Widowed    Spouse name: Not on file   Number of children: 2   Years of education: Not on file   Highest education level: Some college, no degree  Occupational History    Employer: [employer]    Comment: P/T   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    Employer: [employer]  Tobacco Use   Smoking status: Former    Current packs/day: 0.00    Types: Cigarettes    Quit date: 10/23/2007    Years since quitting: 16.4   Smokeless tobacco: Never  Vaping Use   Vaping status: Never Used  Substance and Sexual Activity   Alcohol use: Yes    Alcohol/week: 0.0 standard drinks of alcohol  Comment: Occ   Drug use: No   Sexual activity: Not Currently    Partners: Male  Other Topics Concern   Not on file  Social History Narrative   G2p2      Exercise- no      Right handed   Social Drivers of Health   Financial Resource Strain: Medium Risk (03/26/2024)   Overall Financial Resource Strain (CARDIA)    Difficulty of Paying Living Expenses: Somewhat hard  Food Insecurity: Food Insecurity Present (03/26/2024)   Hunger Vital Sign    Worried About Running Out of Food in the Last Year: Sometimes true    Ran Out of Food in the Last Year:  Sometimes true  Transportation Needs: No Transportation Needs (03/26/2024)   PRAPARE - Administrator, Civil Service (Medical): No    Lack of Transportation (Non-Medical): No  Physical Activity: Insufficiently Active (03/26/2024)   Exercise Vital Sign    Days of Exercise per Week: 1 day    Minutes of Exercise per Session: 10 min  Stress: No Stress Concern Present (03/26/2024)   [employer] of Occupational Health - Occupational Stress Questionnaire    Feeling of Stress: Only a little  Social Connections: Moderately Isolated (03/26/2024)   Social Connection and Isolation Panel    Frequency of Communication with Friends and Family: More than three times a week    Frequency of Social Gatherings with Friends and Family: Once a week    Attends Religious Services: Never    Database administrator or Organizations: Yes    Attends Banker Meetings: 1 to 4 times per year    Marital Status: Widowed  Intimate Partner Violence: Not on file    Past Surgical History:  Procedure Laterality Date   ABLATION OF DYSRHYTHMIC FOCUS  2002   for Ablation of SVT   APPENDECTOMY  07/24/1984   CARPAL TUNNEL RELEASE Bilateral 07/24/1998   CERVICAL FUSION  08-24-99,04-26-00,05-09-01   C6-C7  ACDF and then posterior fusions   FOOT SURGERY Right    4th toe bone removal   Knee sugery Left 1988   Arthroscopies x 2   for meniscus repair   SHOULDER SURGERY Bilateral left-6-04, right 09-12-04   tongue polyp  07/24/1982    Family History  Problem Relation Age of Onset   Migraines Mother    COPD Mother    Heart disease Mother        chf   Depression Mother    Hodgkin's lymphoma Father    Breast cancer Sister    ADD / ADHD Brother    Cancer Cousin    Diabetes Other    Thyroid disease Other    Hypertension Other    Depression Other    Bipolar disorder Other     Allergies  Allergen Reactions   Ketorolac Tromethamine Hives   Morphine Other (See Comments)    makes me the devil.    Seasonal Ic [Octacosanol]     Seasonal Allergies    Latex Rash and Other (See Comments)    Latex catheter caused extreme irritation    Current Outpatient Medications on File Prior to Visit  Medication Sig Dispense Refill   albuterol  (PROVENTIL ) (2.5 MG/3ML) 0.083% nebulizer solution Take 3 mLs (2.5 mg total) by nebulization every 6 (six) hours as needed for wheezing or shortness of breath. 75 mL 12   albuterol  (VENTOLIN  HFA) 108 (90 Base) MCG/ACT inhaler Inhale 2 puffs into the lungs every 6 (six) hours as needed  for wheezing or shortness of breath. 18 g 5   amphetamine -dextroamphetamine  (ADDERALL) 30 MG tablet Take 1 tablet by mouth 2 (two) times daily. 180 tablet 0   botulinum toxin Type A  (BOTOX ) 200 units injection Inject 200 Units into the muscle every 3 (three) months. Inject 155 units IM into multiple site in the face,neck and head once every 90 days 1 each 4   buPROPion  (WELLBUTRIN  XL) 300 MG 24 hr tablet Take 1 tablet (300 mg total) by mouth daily. 30 tablet 2   cetirizine (ZYRTEC) 10 MG tablet Take 10 mg by mouth daily.     citalopram (CELEXA) 40 MG tablet Take 1 tablet (40 mg total) by mouth daily. 30 tablet 3   eletriptan  (RELPAX ) 40 MG tablet Take 1 tablet (40 mg total) by mouth as needed for migraine or headache. May repeat after 2 hours.  Maximum 2 tablets in 24 hours. 10 tablet 5   HYDROcodone -acetaminophen  (NORCO) 10-325 MG tablet Take 1 tablet by mouth every 6 (six) hours as needed for severe pain (pain score 7-10). 30 tablet 0   meclizine  (ANTIVERT ) 25 MG tablet TAKE 1 TABLET BY MOUTH 3 TIMES DAILY AS NEEDED FOR DIZZINESS. 30 tablet 0   meloxicam  (MOBIC ) 7.5 MG tablet TAKE 1 TO 2 TABLETS DAILY AS NEEDED FOR PAIN 180 tablet 2   mometasone  (NASONEX ) 50 MCG/ACT nasal spray Place 2 sprays into the nose daily. (Patient taking differently: Place 2 sprays into the nose daily as needed (Allergies).) 51 each 4   norethindrone  (INCASSIA ) 0.35 MG tablet Take 1 tablet (0.35 mg total) by  mouth daily. 28 tablet 11   ondansetron  (ZOFRAN -ODT) 8 MG disintegrating tablet TAKE 1 TABLET BY MOUTH EVERY 8 HOURS AS NEEDED FOR NAUSEA OR VOMITING. 20 tablet 1   predniSONE  (STERAPRED UNI-PAK 21 TAB) 10 MG (21) TBPK tablet take 60mg  day 1, then 50mg  day 2, then 40mg  day 3, then 30mg  day 4, then 20mg  day 5, then 10mg  day 6, then STOP 21 tablet 0   tiZANidine  (ZANAFLEX ) 4 MG tablet Take 1 tablet (4 mg total) by mouth 3 (three) times daily as needed for muscle spasms. 90 tablet 5   traMADol  (ULTRAM ) 50 MG tablet TAKE 1 TABLET (50 MG TOTAL) BY MOUTH EVERY 8 (EIGHT) HOURS AS NEEDED (MOD PAIN). 30 tablet 0   TRELEGY ELLIPTA  200-62.5-25 MCG/ACT AEPB INHALE 1 PUFF BY MOUTH EVERY DAY 180 each 1   valACYclovir  (VALTREX ) 1000 MG tablet TAKE 1 TABLET BY MOUTH THREE TIMES A DAY 30 tablet 2   Vitamin D , Ergocalciferol , (DRISDOL ) 1.25 MG (50000 UNIT) CAPS capsule TAKE 1 CAPSULE (50,000 UNITS TOTAL) BY MOUTH EVERY 7 (SEVEN) DAYS 12 capsule 1   No current facility-administered medications on file prior to visit.    BP (!) 124/90   Pulse 98   Temp 98.2 F (36.8 C) (Oral)   Resp 16   Ht 5' 7 (1.702 m)   Wt (!) 335 lb 12.8 oz (152.3 kg)   LMP 05/25/2021   SpO2 99%   BMI 52.59 kg/m        Objective:   Physical Exam  General Mental Status- Alert. General Appearance- Not in acute distress.   Skin General: Color- Normal Color. Moisture- Normal Moisture.  Neck Carotid Arteries- Normal color. Moisture- Normal Moisture. No carotid bruits. No JVD.  Chest and Lung Exam Auscultation: Breath Sounds:-CTA  Cardiovascular Auscultation:Rythm- RRR Murmurs & Other Heart Sounds:Auscultation of the heart reveals- No Murmurs.  Abdomen Inspection:-Inspeection Normal. Palpation/Percussion:Note:No mass.  Palpation and Percussion of the abdomen reveal- Non Tender, Non Distended + BS, no rebound or guarding.    Neurologic Cranial Nerve exam:- CN III-XII intact(No nystagmus), symmetric smile. Strength:-  5/5 equal and symmetric strength both upper and lower extremities.   Heent- moderate maxillary sinus pressure. Mild red pharynx.   Lower ext- calfs symmetric, no pedal edema and  negative homans signs,    Assessment & Plan:   Patient Instructions  Acute maxillary sinusitis with acute cough and allergic rhinitis Symptoms consistent with acute maxillary sinusitis. History of recurrent sinus infections. Current episode includes acute cough and potential allergic rhinitis. yeast infection. - Prescribe cefdinir  600 mg twice a day for 10 days. - Prescribe Diflucan  one tablet for one day, with instructions to use around day five if needed yeast infection and to request a refill on day ten if necessary. - Prescribe Tessalon  Perles for cough management. - Advise continuation of mometasone  (Nasonex ) nasal spray. - Advise continuation of cetirizine for potential allergy component.  Asthma Asthma with intermittent bronchitis and wheezing, often triggered by sinus infections and chest congestion. Reports slight chest tightness, indicating potential wheezing onset. Requires Trelegy refill due to insurance lapse. Plan includes monitoring for wheezing and using inhalers as needed, with conditional steroid treatment if symptoms worsen. - Advise use of albuterol  as needed for wheezing. - Advise obtaining a refill of Trelegy. - Instruct to contact the clinic if wheezing occurs despite inhaler use, for potential Medrol  taper prescription.  Follow up 7-10 days or sooner if needed   [employer], PA-C

## 2024-03-28 ENCOUNTER — Ambulatory Visit: Admitting: Family Medicine

## 2024-03-28 ENCOUNTER — Encounter: Payer: Self-pay | Admitting: Family Medicine

## 2024-03-28 VITALS — BP 144/88 | HR 89 | Temp 98.8°F | Ht 67.0 in | Wt 335.8 lb

## 2024-03-28 DIAGNOSIS — F909 Attention-deficit hyperactivity disorder, unspecified type: Secondary | ICD-10-CM

## 2024-03-28 DIAGNOSIS — Z79899 Other long term (current) drug therapy: Secondary | ICD-10-CM

## 2024-03-28 DIAGNOSIS — I10 Essential (primary) hypertension: Secondary | ICD-10-CM | POA: Diagnosis not present

## 2024-03-28 DIAGNOSIS — E119 Type 2 diabetes mellitus without complications: Secondary | ICD-10-CM | POA: Diagnosis not present

## 2024-03-28 DIAGNOSIS — Z6841 Body Mass Index (BMI) 40.0 and over, adult: Secondary | ICD-10-CM

## 2024-03-28 DIAGNOSIS — Z7985 Long-term (current) use of injectable non-insulin antidiabetic drugs: Secondary | ICD-10-CM

## 2024-03-28 MED ORDER — ZEPBOUND 2.5 MG/0.5ML ~~LOC~~ SOAJ: 2.5000 mg | SUBCUTANEOUS | 0 refills | Status: DC
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
## 2024-03-28 NOTE — Assessment & Plan Note (Signed)
 Update contract and uds Database reviewed  On adderall

## 2024-03-28 NOTE — Assessment & Plan Note (Signed)
 Pt working on exercise and lifestyle

## 2024-03-28 NOTE — Assessment & Plan Note (Signed)
 Well controlled, no changes to meds. Encouraged heart healthy diet such as the DASH diet and exercise as tolerated.

## 2024-03-29 ENCOUNTER — Encounter: Payer: Self-pay | Admitting: Family Medicine

## 2024-03-30 ENCOUNTER — Ambulatory Visit: Payer: Self-pay | Admitting: Family Medicine

## 2024-03-30 LAB — DRUG MONITORING PANEL 376104, URINE
Amphetamines: NEGATIVE ng/mL (ref <500)
Barbiturates: NEGATIVE ng/mL (ref <300)
Benzodiazepines: NEGATIVE ng/mL (ref <100)
Cocaine Metabolite: NEGATIVE ng/mL (ref <150)
Desmethyltramadol: NEGATIVE ng/mL (ref <100)
Opiates: NEGATIVE ng/mL (ref <100)
Oxycodone: NEGATIVE ng/mL (ref <100)
Tramadol: NEGATIVE ng/mL (ref <100)

## 2024-03-30 LAB — DM TEMPLATE

## 2024-03-31 ENCOUNTER — Telehealth: Payer: Self-pay

## 2024-03-31 ENCOUNTER — Other Ambulatory Visit (HOSPITAL_COMMUNITY): Payer: Self-pay

## 2024-03-31 LAB — LIPID PANEL
Cholesterol: 195 mg/dL (ref <200)
HDL: 84 mg/dL (ref >=50)
LDL Cholesterol (Calc): 96 mg/dL
Non-HDL Cholesterol (Calc): 111 mg/dL (ref <130)
Total CHOL/HDL Ratio: 2.3 (calc) (ref <5.0)
Triglycerides: 65 mg/dL (ref <150)

## 2024-03-31 LAB — COMPREHENSIVE METABOLIC PANEL WITH GFR
AG Ratio: 1.6 (calc) (ref 1.0–2.5)
ALT: 27 U/L (ref 6–29)
AST: 20 U/L (ref 10–35)
Albumin: 4.4 g/dL (ref 3.6–5.1)
Alkaline phosphatase (APISO): 86 U/L (ref 37–153)
BUN: 12 mg/dL (ref 7–25)
CO2: 30 mmol/L (ref 20–32)
Calcium: 9.5 mg/dL (ref 8.6–10.4)
Chloride: 102 mmol/L (ref 98–110)
Creat: 0.73 mg/dL (ref 0.50–1.03)
Globulin: 2.7 g/dL (ref 1.9–3.7)
Glucose, Bld: 100 mg/dL — ABNORMAL HIGH (ref 65–99)
Potassium: 4.9 mmol/L (ref 3.5–5.3)
Sodium: 140 mmol/L (ref 135–146)
Total Bilirubin: 0.6 mg/dL (ref 0.2–1.2)
Total Protein: 7.1 g/dL (ref 6.1–8.1)
eGFR: 99 mL/min/1.73m2 (ref >=60)

## 2024-03-31 LAB — HEMOGLOBIN A1C
Hgb A1c MFr Bld: 5.5 % (ref <5.7)
Mean Plasma Glucose: 111 mg/dL
eAG (mmol/L): 6.2 mmol/L

## 2024-03-31 LAB — CBC WITH DIFFERENTIAL/PLATELET
Absolute Lymphocytes: 727 {cells}/uL — ABNORMAL LOW (ref 850–3900)
Absolute Monocytes: 468 {cells}/uL (ref 200–950)
Basophils Absolute: 43 {cells}/uL (ref 0–200)
Basophils Relative: 0.6 %
Eosinophils Absolute: 50 {cells}/uL (ref 15–500)
Eosinophils Relative: 0.7 %
HCT: 44.4 % (ref 35.0–45.0)
Hemoglobin: 14.4 g/dL (ref 11.7–15.5)
MCH: 28.5 pg (ref 27.0–33.0)
MCHC: 32.4 g/dL (ref 32.0–36.0)
MCV: 87.7 fL (ref 80.0–100.0)
MPV: 12.5 fL (ref 7.5–12.5)
Monocytes Relative: 6.5 %
Neutro Abs: 5911 {cells}/uL (ref 1500–7800)
Neutrophils Relative %: 82.1 %
Platelets: 267 Thousand/uL (ref 140–400)
RBC: 5.06 Million/uL (ref 3.80–5.10)
RDW: 12.7 % (ref 11.0–15.0)
Total Lymphocyte: 10.1 %
WBC: 7.2 Thousand/uL (ref 3.8–10.8)

## 2024-03-31 LAB — INSULIN, RANDOM: Insulin: 23.7 u[IU]/mL — ABNORMAL HIGH

## 2024-03-31 LAB — TSH: TSH: 0.95 m[IU]/L

## 2024-03-31 LAB — VITAMIN D 25 HYDROXY (VIT D DEFICIENCY, FRACTURES): Vit D, 25-Hydroxy: 30 ng/mL (ref 30–100)

## 2024-03-31 LAB — VITAMIN B12: Vitamin B-12: 410 pg/mL (ref 200–1100)

## 2024-03-31 NOTE — Telephone Encounter (Signed)
 Pharmacy Patient Advocate Encounter   Received notification from Pt Calls Messages that prior authorization for Zepbound  2.5mg /0.42ml is required/requested.   Insurance verification completed.   The patient is insured through [facility] .   Per test claim: Refill too soon. PA is not needed at this time. Medication was filled 03/28/24. Next eligible fill date is 04/18/24.   Per [facility], they received a paid claim and the copay is $1,052.79. The patient also has a [employer] medicaid card but Zepbound  is not covered without a 3 month trial of [employer].

## 2024-03-31 NOTE — Telephone Encounter (Signed)
 Needs PA for Zepbound  please.

## 2024-04-01 ENCOUNTER — Other Ambulatory Visit (HOSPITAL_COMMUNITY): Payer: Self-pay

## 2024-04-01 ENCOUNTER — Telehealth: Payer: Self-pay

## 2024-04-01 NOTE — Telephone Encounter (Signed)
 Pharmacy Patient Advocate Encounter   Received notification from CoverMyMeds that prior authorization for Eletriptan  40MG  tablet is required/requested.   Insurance verification completed.   The patient is insured through [facility] ADV[employer] .   Per test claim: The current 30 day (qty 10 tablet) day co-pay is, $5.00.  No PA needed at this time. This test claim was processed through [facility]- copay amounts may vary at other pharmacies due to pharmacy/plan contracts, or as the patient moves through the different stages of their insurance plan.

## 2024-04-02 ENCOUNTER — Encounter: Payer: Self-pay | Admitting: Pulmonary Disease

## 2024-04-02 ENCOUNTER — Ambulatory Visit (INDEPENDENT_AMBULATORY_CARE_PROVIDER_SITE_OTHER): Admitting: Pulmonary Disease

## 2024-04-02 DIAGNOSIS — J40 Bronchitis, not specified as acute or chronic: Secondary | ICD-10-CM

## 2024-04-02 DIAGNOSIS — J019 Acute sinusitis, unspecified: Secondary | ICD-10-CM | POA: Diagnosis not present

## 2024-04-02 DIAGNOSIS — J45901 Unspecified asthma with (acute) exacerbation: Secondary | ICD-10-CM | POA: Diagnosis not present

## 2024-04-02 DIAGNOSIS — Z87891 Personal history of nicotine dependence: Secondary | ICD-10-CM | POA: Diagnosis not present

## 2024-04-02 MED ORDER — ALBUTEROL SULFATE (2.5 MG/3ML) 0.083% IN NEBU: 2.5000 mg | INHALATION_SOLUTION | Freq: Four times a day (QID) | RESPIRATORY_TRACT | 12 refills | Status: AC | PRN

## 2024-04-02 MED ORDER — AMOXICILLIN-POT CLAVULANATE 875-125 MG PO TABS: 1.0000 | ORAL_TABLET | Freq: Two times a day (BID) | ORAL | 0 refills | Status: AC

## 2024-04-02 MED ORDER — AZITHROMYCIN 250 MG PO TABS: ORAL_TABLET | ORAL | 0 refills | Status: AC

## 2024-04-02 MED ORDER — PREDNISONE 20 MG PO TABS: ORAL_TABLET | ORAL | 0 refills | Status: AC

## 2024-04-02 NOTE — Progress Notes (Signed)
 @Patient  ID: Jo Jackson, female    DOB: [birth year], 53 y.o.   MRN: [id number]  Chief Complaint  Patient presents with   Medical Management of Chronic Issues    PT state head / congestion x1 week    Referring provider: Antonio Cyndee Jamee JONELLE, *  HPI:   53 y.o. woman whom are seen in consultation for evaluation of cough, dyspnea, bronchitis symptoms, history of asthma.  History from pulmonary clinic, Madelin Lam NP reviewed.  Overall seem to be doing well.  Maintained on Trelegy.  For asthma.  PFTs in interim 09/2021 reviewed, these are normal my interpretation.  Recent worsening head congestion cough etc.  Really sinus congestion.  Despite cefdinir  and steroid taper mild to minimal improvement.  Not using nebulizer as nebulizer machine is broken.  Lung exam a bit crackly.  No fevers.  Dyspnea on exertion low but worse than baseline.  Discussed adding Z-Pak for atypical coverage.  Discussed additional antibiotic and prednisone  taper in the coming days if not improving.  Got sick from children, they brought home some [place], virus from school.   HPI initial visit: She notes history of recurrent bronchitis about once a year.  Usually in December.  No more than once a year but reliably usually once a year.  Symptoms last a week or so.  Cough sputum production etc.  Gradually improved.  She has been dealing with recurrent case of bronchitis for the last month.  Ongoing cough.  Lasting much longer than typical.  Mild dyspnea.  Some chest discomfort or tightness.  No real pain but sensation of not getting a deep breath in her upper chest.  She is been using Advair mid dose Diskus twice daily with mild improvement.  Albuterol  helps some.  Has use of prednisone  course and that seemed to help but only for a few days and symptoms recur.  She has had lingering worsening mild dyspnea cough at work for the preceding months.  Associated with mildew exposure.  Concern for mold exposure.  A week out of  work and things are a bit better.  Not a lot improved on the weekends when she is not working.  Otherwise, no time of day when symptoms are better or worse.  No consistent weakness or worse.  No other alleviating or exacerbating factors.  She had a chest x-ray 01/2022 on my review interpretation reveals clear lungs with mild hyperinflation on the lateral view.  Compared to previous chest x-ray 05/2021 similar appearance of mild hyperinflation on the lateral with clear lungs otherwise on my review interpretation.  PMH: Asthma, migraines Surgical history: Cervical spine surgery, appendectomy Family history: Migraines, COPD, CHF in mother, father with Hodgkin's lymphoma Social history: Never smoker, lives in [place], is the wife of Caterine Mcmeans who many our group took care of after prolonged COVID hospitalization and subsequent respiratory failure and tracheostomy dependence who unfortunately passed away in summer 2022   Questionaires / Pulmonary Flowsheets:   ACT:  Asthma Control Test ACT Total Score  03/03/2022  9:54 AM 14    MMRC:     No data to display          Epworth:      No data to display          Tests:   FENO:  No results found for: NITRICOXIDE  PFT:    Latest Ref Rng & Units 10/19/2023   12:15 PM  PFT Results  FVC-Pre L 3.51   FVC-Predicted Pre %  90   FVC-Post L 3.37   FVC-Predicted Post % 86   Pre FEV1/FVC % % 78   Post FEV1/FCV % % 83   FEV1-Pre L 2.74   FEV1-Predicted Pre % 89   FEV1-Post L 2.79   DLCO uncorrected ml/min/mmHg 25.88   DLCO UNC% % 111   DLCO corrected ml/min/mmHg 25.28   DLCO COR %Predicted % 109   DLVA Predicted % 115   TLC L 5.37   TLC % Predicted % 97   RV % Predicted % 90   Personally viewed interpreted as normal spirometry, no bronchodilator response, lung volumes within normals, DLCO within normal limits.  WALK:      No data to display          Imaging: Personally reviewed and as per EMR and discussion in  this note No results found.  Lab Results: Personally reviewed CBC    Component Value Date/Time   WBC 7.2 03/28/2024 1413   RBC 5.06 03/28/2024 1413   HGB 14.4 03/28/2024 1413   HCT 44.4 03/28/2024 1413   PLT 267 03/28/2024 1413   MCV 87.7 03/28/2024 1413   MCH 28.5 03/28/2024 1413   MCHC 32.4 03/28/2024 1413   RDW 12.7 03/28/2024 1413   LYMPHSABS 1.8 02/06/2023 0947   MONOABS 0.4 02/06/2023 0947   EOSABS 50 03/28/2024 1413   BASOSABS 43 03/28/2024 1413    BMET    Component Value Date/Time   NA 140 03/28/2024 1413   K 4.9 03/28/2024 1413   CL 102 03/28/2024 1413   CO2 30 03/28/2024 1413   GLUCOSE 100 (H) 03/28/2024 1413   BUN 12 03/28/2024 1413   CREATININE 0.73 03/28/2024 1413   CALCIUM 9.5 03/28/2024 1413   GFRNONAA >60 11/26/2023 1044   GFRAA >90 12/13/2012 1900    BNP No results found for: BNP  ProBNP No results found for: PROBNP  Specialty Problems       Pulmonary Problems   SINUSITIS - ACUTE-NOS   Qualifier: Diagnosis of  By: Mahlon MD, Katherine        Bronchitis   Viral URI   Pansinusitis   Influenza   Acute cough   Pharyngitis    Allergies  Allergen Reactions   Ketorolac Tromethamine Hives   Morphine Other (See Comments)    makes me the devil.   Seasonal Ic [Octacosanol]     Seasonal Allergies    Latex Rash and Other (See Comments)    Latex catheter caused extreme irritation    Immunization History  Administered Date(s) Administered   Hepb-cpg 04/06/2022, 03/20/2023   Influenza Whole 04/26/2009, 04/21/2010   Influenza, Mdck, Trivalent,PF 6+ MOS(egg free) 03/20/2023   Influenza,inj,Quad PF,6+ Mos 04/23/2013, 04/22/2015, 05/11/2016, 05/10/2017, 03/22/2018, 05/23/2019, 06/19/2020, 04/15/2021, 03/03/2022   Influenza-Unspecified 06/19/2020   Janssen (J&J) SARS-COV-2 Vaccination 10/04/2019, 06/19/2020   Moderna Sars-Covid-2 Vaccination 06/19/2020   PFIZER(Purple Top)SARS-COV-2 Vaccination 04/01/2021   PNEUMOCOCCAL CONJUGATE-20  04/01/2021, 03/16/2022   Tdap 06/24/2022   Zoster Recombinant(Shingrix) 03/03/2022, 03/20/2023    Past Medical History:  Diagnosis Date   Abnormal Pap smear    Repeats WNL   ADD (attention deficit disorder)    Anxiety    Arthritis    Oseteoarthritis   Complication of anesthesia    Hard time waking up   Depression    Fibromyalgia    GERD (gastroesophageal reflux disease)    Headache(784.0)    Migraines   Junctional escape rhythm (HCC)    PONV (postoperative nausea and vomiting)  Pregnancy induced hypertension    Sleep apnea    Supraventricular tachycardia (HCC)     Tobacco History: Social History   Tobacco Use  Smoking Status Former   Current packs/day: 0.00   Types: Cigarettes   Quit date: 10/23/2007   Years since quitting: 16.4  Smokeless Tobacco Never   Counseling given: Not Answered   Continue to not smoke  Outpatient Encounter Medications as of 04/02/2024  Medication Sig   albuterol  (VENTOLIN  HFA) 108 (90 Base) MCG/ACT inhaler Inhale 2 puffs into the lungs every 6 (six) hours as needed for wheezing or shortness of breath.   amoxicillin -clavulanate (AUGMENTIN ) 875-125 MG tablet Take 1 tablet by mouth 2 (two) times daily for 10 days.   amphetamine -dextroamphetamine  (ADDERALL) 30 MG tablet Take 1 tablet by mouth 2 (two) times daily.   azithromycin  (ZITHROMAX ) 250 MG tablet Take 2 tablets (500 mg total) by mouth daily for 1 day, THEN 1 tablet (250 mg total) daily for 4 days.   benzonatate  (TESSALON ) 100 MG capsule Take 1 capsule (100 mg total) by mouth 3 (three) times daily as needed for cough.   botulinum toxin Type A  (BOTOX ) 200 units injection Inject 200 Units into the muscle every 3 (three) months. Inject 155 units IM into multiple site in the face,neck and head once every 90 days   cefdinir  (OMNICEF ) 300 MG capsule Take 1 capsule (300 mg total) by mouth 2 (two) times daily.   cetirizine (ZYRTEC) 10 MG tablet Take 10 mg by mouth daily.   citalopram (CELEXA) 40  MG tablet Take 1 tablet (40 mg total) by mouth daily.   eletriptan  (RELPAX ) 40 MG tablet Take 1 tablet (40 mg total) by mouth as needed for migraine or headache. May repeat after 2 hours.  Maximum 2 tablets in 24 hours.   fluconazole  (DIFLUCAN ) 150 MG tablet Take 1 tablet (150 mg total) by mouth daily.   HYDROcodone -acetaminophen  (NORCO) 10-325 MG tablet Take 1 tablet by mouth every 6 (six) hours as needed for severe pain (pain score 7-10).   meclizine  (ANTIVERT ) 25 MG tablet TAKE 1 TABLET BY MOUTH 3 TIMES DAILY AS NEEDED FOR DIZZINESS.   meloxicam  (MOBIC ) 7.5 MG tablet TAKE 1 TO 2 TABLETS DAILY AS NEEDED FOR PAIN   mometasone  (NASONEX ) 50 MCG/ACT nasal spray Place 2 sprays into the nose daily. (Patient taking differently: Place 2 sprays into the nose daily as needed (Allergies).)   norethindrone  (INCASSIA ) 0.35 MG tablet Take 1 tablet (0.35 mg total) by mouth daily.   ondansetron  (ZOFRAN -ODT) 8 MG disintegrating tablet TAKE 1 TABLET BY MOUTH EVERY 8 HOURS AS NEEDED FOR NAUSEA OR VOMITING.   predniSONE  (DELTASONE ) 20 MG tablet Take 2 tablets (40 mg total) by mouth daily with breakfast for 5 days, THEN 1 tablet (20 mg total) daily with breakfast for 5 days.   predniSONE  (STERAPRED UNI-PAK 21 TAB) 10 MG (21) TBPK tablet take 60mg  day 1, then 50mg  day 2, then 40mg  day 3, then 30mg  day 4, then 20mg  day 5, then 10mg  day 6, then STOP   tirzepatide  (ZEPBOUND ) 2.5 MG/0.5ML Pen Inject 2.5 mg into the skin once a week.   tiZANidine  (ZANAFLEX ) 4 MG tablet Take 1 tablet (4 mg total) by mouth 3 (three) times daily as needed for muscle spasms.   traMADol  (ULTRAM ) 50 MG tablet TAKE 1 TABLET (50 MG TOTAL) BY MOUTH EVERY 8 (EIGHT) HOURS AS NEEDED (MOD PAIN).   TRELEGY ELLIPTA  200-62.5-25 MCG/ACT AEPB INHALE 1 PUFF BY MOUTH EVERY DAY  valACYclovir  (VALTREX ) 1000 MG tablet TAKE 1 TABLET BY MOUTH THREE TIMES A DAY   Vitamin D , Ergocalciferol , (DRISDOL ) 1.25 MG (50000 UNIT) CAPS capsule TAKE 1 CAPSULE (50,000 UNITS  TOTAL) BY MOUTH EVERY 7 (SEVEN) DAYS   [DISCONTINUED] albuterol  (PROVENTIL ) (2.5 MG/3ML) 0.083% nebulizer solution Take 3 mLs (2.5 mg total) by nebulization every 6 (six) hours as needed for wheezing or shortness of breath.   albuterol  (PROVENTIL ) (2.5 MG/3ML) 0.083% nebulizer solution Take 3 mLs (2.5 mg total) by nebulization every 6 (six) hours as needed for wheezing or shortness of breath.   No facility-administered encounter medications on file as of 04/02/2024.     Review of Systems  Review of Systems  N/a Physical Exam  BP 116/80   Pulse 77   Ht 5' 7 (1.702 m)   Wt (!) 331 lb (150.1 kg)   LMP 05/25/2021   SpO2 98%   BMI 51.84 kg/m   Wt Readings from Last 5 Encounters:  04/02/24 (!) 331 lb (150.1 kg)  03/28/24 (!) 335 lb 12.8 oz (152.3 kg)  03/27/24 (!) 335 lb 12.8 oz (152.3 kg)  03/14/24 (!) 334 lb (151.5 kg)  11/23/23 (!) 326 lb (147.9 kg)    BMI Readings from Last 5 Encounters:  04/02/24 51.84 kg/m  03/28/24 52.59 kg/m  03/27/24 52.59 kg/m  03/14/24 52.31 kg/m  11/23/23 51.06 kg/m     Physical Exam General: Well-appearing, sitting in chair Eyes: EOMI, no icterus Neck: Supple, no JVP Pulmonary: Scattered inspiratory squeaks and scattered wheeze Cardiovascular: Warm, no edema Abdomen: Nondistended, sounds present MSK: No synovitis, no joint effusion Neuro: Normal gait, no weakness Psych: Normal mood, full affect   Assessment & Plan:   Recurrent bronchitis, prolonged cough, mild dyspnea: Suspicious for poorly controlled asthma.  Mild temporary improvement with albuterol  as well as prednisone  courses.  Chest imaging clear.  Tessalon  Perles prescribed today.  Please see below.  If not improving, pursue PFTs in the future.  Asthma with acute exacerbation: Longstanding.  Symptoms worse since mildew exposure.  Possible sensitizer/allergen.  Overall improved with high-dose Trelegy.  To continue.  Albuterol  nebulizer refilled, continue albuterol  as needed.   New order for nebulizer machine today.  Continue antibiotics and steroid taper.  Add Z-Pak, new prescription today.  Acute sinusitis: Not really improving.  Ongoing congestion.  If not improving with current prednisone  and antibiotic course, additional Augmentin  and prednisone  taper sent.   Return in about 3 months (around 07/02/2024) for f/u Dr. Annella.   Donnice JONELLE Annella, MD 04/02/2024

## 2024-04-02 NOTE — Patient Instructions (Signed)
 Nice to see you again  Take Z-Pak starting today, 2 tablets today and 1 tablet the next 4 days, 5 days total  I sent a prescription for Augmentin  and prednisone , take as prescribed if things are not improving this weekend.  Continue the cefdinir  and current steroid taper.  I am hopeful you will continue to see some improvement in the coming days, if not start these medicines.  We will send a order for a new nebulizer machine  I refilled the albuterol  nebulizer solution to use with the nebulizer machine  Return to clinic in 2 to 3 months or sooner as needed with Dr. Annella

## 2024-04-04 ENCOUNTER — Ambulatory Visit: Admitting: Neurology

## 2024-04-04 ENCOUNTER — Ambulatory Visit (INDEPENDENT_AMBULATORY_CARE_PROVIDER_SITE_OTHER): Admitting: Neurology

## 2024-04-04 DIAGNOSIS — G43709 Chronic migraine without aura, not intractable, without status migrainosus: Secondary | ICD-10-CM | POA: Diagnosis not present

## 2024-04-04 MED ORDER — ONABOTULINUMTOXINA 100 UNITS IJ SOLR
200.0000 [IU] | Freq: Once | INTRAMUSCULAR | Status: AC
  Administered 2024-04-04: 155 [IU] via INTRAMUSCULAR

## 2024-04-04 NOTE — Addendum Note (Signed)
 Addended by: OZELL JESUSA PARAS on: 04/04/2024 04:03 PM   Modules accepted: Orders

## 2024-04-04 NOTE — Progress Notes (Signed)

## 2024-04-05 ENCOUNTER — Other Ambulatory Visit

## 2024-04-06 ENCOUNTER — Encounter: Payer: Self-pay | Admitting: Pulmonary Disease

## 2024-04-07 ENCOUNTER — Other Ambulatory Visit: Payer: Self-pay

## 2024-04-07 DIAGNOSIS — J4540 Moderate persistent asthma, uncomplicated: Secondary | ICD-10-CM

## 2024-04-07 NOTE — Progress Notes (Signed)
 Pt would like neb machine cancelled through [employer], pt has bought her own machine out of pocket.

## 2024-04-09 ENCOUNTER — Other Ambulatory Visit: Payer: Self-pay | Admitting: Neurology

## 2024-04-09 ENCOUNTER — Encounter: Payer: Self-pay | Admitting: Family Medicine

## 2024-04-09 ENCOUNTER — Encounter: Payer: Self-pay | Admitting: Pulmonary Disease

## 2024-04-09 ENCOUNTER — Other Ambulatory Visit: Payer: Self-pay | Admitting: Family Medicine

## 2024-04-09 DIAGNOSIS — F40240 Claustrophobia: Secondary | ICD-10-CM

## 2024-04-09 MED ORDER — DIAZEPAM 5 MG PO TABS: ORAL_TABLET | ORAL | 0 refills | Status: DC

## 2024-04-09 NOTE — Telephone Encounter (Signed)
**Note De-identified  [place] Obfuscation** Please advise 

## 2024-04-15 ENCOUNTER — Telehealth: Payer: Self-pay

## 2024-04-15 NOTE — Telephone Encounter (Signed)
 Fax received from  imaging, MRI appt scheduled for 04/23/24

## 2024-04-25 ENCOUNTER — Encounter: Payer: Self-pay | Admitting: Family Medicine

## 2024-04-28 ENCOUNTER — Other Ambulatory Visit: Payer: Self-pay

## 2024-04-28 ENCOUNTER — Other Ambulatory Visit (HOSPITAL_COMMUNITY): Payer: Self-pay

## 2024-04-28 NOTE — Telephone Encounter (Signed)
 PA request has been Submitted for Tramadol  and Hydrocodone .

## 2024-04-29 ENCOUNTER — Telehealth: Payer: Self-pay

## 2024-04-29 NOTE — Telephone Encounter (Signed)
 Pharmacy Patient Advocate Encounter  Received notification from [facility] that Prior Authorization for Hydrocodone /APAP 10/325mg  has been APPROVED from 04/28/24 to 10/25/24   PA #/Case ID/Reference #: [id number]

## 2024-04-29 NOTE — Telephone Encounter (Signed)
 Pharmacy Patient Advocate Encounter  Received notification from [facility] that Prior Authorization for Tramadol  50mg  tabs has been APPROVED from 04/28/24 to 10/27/24   PA #/Case ID/Reference #: [id number]

## 2024-04-30 ENCOUNTER — Telehealth: Payer: Self-pay | Admitting: Neurology

## 2024-04-30 NOTE — Telephone Encounter (Signed)
 LMOVM for patient to call the office back.

## 2024-04-30 NOTE — Telephone Encounter (Signed)
 MRI of cervical spine does show arthritic changes in the upper cervical spine with some narrowing of the spinal canal around the spinal cord and possible pinching of the nerves which may be contributing to headaches.  If she agrees, we can refer to a spine surgeon for their input.

## 2024-05-07 NOTE — Telephone Encounter (Signed)
 LMOVM for patient to call the office back. Letter sent as well.

## 2024-05-08 ENCOUNTER — Other Ambulatory Visit: Payer: Self-pay | Admitting: Family Medicine

## 2024-05-08 DIAGNOSIS — N951 Menopausal and female climacteric states: Secondary | ICD-10-CM

## 2024-05-09 ENCOUNTER — Ambulatory Visit: Admitting: Pulmonary Disease

## 2024-05-21 ENCOUNTER — Telehealth: Payer: Self-pay | Admitting: Neurology

## 2024-05-21 NOTE — Telephone Encounter (Signed)
 Patient advised of her MRI Cervical spine.

## 2024-05-21 NOTE — Telephone Encounter (Signed)
 Pt lefted a message stating she wants to speak to [facility]  about the MRI of cervical spine results. Thanks

## 2024-05-22 ENCOUNTER — Encounter: Payer: Self-pay | Admitting: Neurology

## 2024-05-26 ENCOUNTER — Encounter: Payer: Self-pay | Admitting: Family Medicine

## 2024-05-26 ENCOUNTER — Ambulatory Visit (INDEPENDENT_AMBULATORY_CARE_PROVIDER_SITE_OTHER): Admitting: Family Medicine

## 2024-05-26 VITALS — BP 140/80 | HR 82 | Temp 99.1°F | Resp 18 | Ht 67.0 in | Wt 334.6 lb

## 2024-05-26 DIAGNOSIS — J069 Acute upper respiratory infection, unspecified: Secondary | ICD-10-CM

## 2024-05-26 DIAGNOSIS — R110 Nausea: Secondary | ICD-10-CM

## 2024-05-26 DIAGNOSIS — J029 Acute pharyngitis, unspecified: Secondary | ICD-10-CM

## 2024-05-26 DIAGNOSIS — M62838 Other muscle spasm: Secondary | ICD-10-CM | POA: Diagnosis not present

## 2024-05-26 DIAGNOSIS — J020 Streptococcal pharyngitis: Secondary | ICD-10-CM | POA: Insufficient documentation

## 2024-05-26 DIAGNOSIS — M797 Fibromyalgia: Secondary | ICD-10-CM | POA: Diagnosis not present

## 2024-05-26 DIAGNOSIS — Z0000 Encounter for general adult medical examination without abnormal findings: Secondary | ICD-10-CM

## 2024-05-26 LAB — POCT RAPID STREP A (OFFICE): Rapid Strep A Screen: POSITIVE — AB

## 2024-05-26 MED ORDER — HYDROCODONE-ACETAMINOPHEN 10-325 MG PO TABS: 1.0000 | ORAL_TABLET | Freq: Four times a day (QID) | ORAL | 0 refills | Status: AC | PRN

## 2024-05-26 MED ORDER — FLUCONAZOLE 150 MG PO TABS
150.0000 mg | ORAL_TABLET | Freq: Every day | ORAL | 0 refills | Status: AC
Start: 2024-05-26 — End: ?

## 2024-05-26 MED ORDER — ONDANSETRON 8 MG PO TBDP: 8.0000 mg | ORAL_TABLET | Freq: Three times a day (TID) | ORAL | 1 refills | Status: AC | PRN

## 2024-05-26 MED ORDER — AMOXICILLIN 875 MG PO TABS: 875.0000 mg | ORAL_TABLET | Freq: Two times a day (BID) | ORAL | 0 refills | Status: AC

## 2024-05-26 MED ORDER — VITAMIN D (ERGOCALCIFEROL) 1.25 MG (50000 UNIT) PO CAPS: 50000.0000 [IU] | ORAL_CAPSULE | ORAL | 1 refills | Status: AC

## 2024-05-26 MED ORDER — MOMETASONE FUROATE 50 MCG/ACT NA SUSP: 2.0000 | Freq: Every day | NASAL | 4 refills | Status: AC

## 2024-05-26 NOTE — Assessment & Plan Note (Addendum)
 [name]

## 2024-05-26 NOTE — Assessment & Plan Note (Signed)
 Amoxicillin  875 bid x 10 days Tylenol  / advil  as needed  Drink plenty of fluids

## 2024-05-26 NOTE — Progress Notes (Signed)
 Subjective:    Patient ID: Jo Jackson, female    DOB: [birth year], 53 y.o.   MRN: [id number]  Chief Complaint  Patient presents with   Sore Throat    Pt states having sore throat and swollen uvula, sinus congestion, sxs started on Halloween     HPI Patient is in today for sore throat.  Discussed the use of AI scribe software for clinical note transcription with the patient, who gave verbal consent to proceed.  History of Present Illness Jo Jackson is a 53 year old female who presents with a sore throat and suspected strep throat infection.  She describes her throat as 'awful red' and experiences strep throat infections approximately once every two months. She denies any swelling of the glands.  She is not allergic to penicillin . She is currently taking valacyclovir  in preparation for fever blister season and mometasone , which she prefers in a three-month supply due to cost considerations. She also mentions needing a refill of Diflucan  and her vitamin D2, and has ordered [employer] vitamins for herself.  She requests a doctor's note for work absence due to her current illness. She mentions her boss is 'riding her butt'.    Past Medical History:  Diagnosis Date   Abnormal Pap smear    Repeats WNL   ADD (attention deficit disorder)    Anxiety    Arthritis    Oseteoarthritis   Complication of anesthesia    Hard time waking up   Depression    Fibromyalgia    GERD (gastroesophageal reflux disease)    Headache(784.0)    Migraines   Junctional escape rhythm (HCC)    PONV (postoperative nausea and vomiting)    Pregnancy induced hypertension    Sleep apnea    Supraventricular tachycardia     Past Surgical History:  Procedure Laterality Date   ABLATION OF DYSRHYTHMIC FOCUS  2002   for Ablation of SVT   APPENDECTOMY  07/24/1984   CARPAL TUNNEL RELEASE Bilateral 07/24/1998   CERVICAL FUSION  08-24-99,04-26-00,05-09-01   C6-C7  ACDF and then posterior fusions    FOOT SURGERY Right    4th toe bone removal   Knee sugery Left 1988   Arthroscopies x 2   for meniscus repair   SHOULDER SURGERY Bilateral left-6-04, right 09-12-04   tongue polyp  07/24/1982    Family History  Problem Relation Age of Onset   Migraines Mother    COPD Mother    Heart disease Mother        chf   Depression Mother    Hodgkin's lymphoma Father    Breast cancer Sister    ADD / ADHD Brother    Cancer Cousin    Diabetes Other    Thyroid disease Other    Hypertension Other    Depression Other    Bipolar disorder Other     Social History   Socioeconomic History   Marital status: Widowed    Spouse name: Not on file   Number of children: 2   Years of education: Not on file   Highest education level: Some college, no degree  Occupational History    Employer: [employer]    Comment: P/T   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    Employer: [employer]  Tobacco Use   Smoking status: Former    Current packs/day: 0.00    Types: Cigarettes    Quit date: 10/23/2007    Years since quitting: 16.6   Smokeless tobacco: Never  Vaping Use  Vaping status: Never Used  Substance and Sexual Activity   Alcohol use: Yes    Alcohol/week: 0.0 standard drinks of alcohol    Comment: Occ   Drug use: No   Sexual activity: Not Currently    Partners: Male  Other Topics Concern   Not on file  Social History Narrative   G2p2      Exercise- no      Right handed   Social Drivers of Health   Financial Resource Strain: High Risk (05/25/2024)   Overall Financial Resource Strain (CARDIA)    Difficulty of Paying Living Expenses: Hard  Food Insecurity: Food Insecurity Present (05/25/2024)   Hunger Vital Sign    Worried About Running Out of Food in the Last Year: Often true    Ran Out of Food in the Last Year: Often true  Transportation Needs: No Transportation Needs (05/25/2024)   PRAPARE - Administrator, Civil Service (Medical): No    Lack of Transportation (Non-Medical): No   Physical Activity: Inactive (05/25/2024)   Exercise Vital Sign    Days of Exercise per Week: 0 days    Minutes of Exercise per Session: Not on file  Stress: Stress Concern Present (05/25/2024)   [employer] of Occupational Health - Occupational Stress Questionnaire    Feeling of Stress: Very much  Social Connections: Moderately Integrated (05/25/2024)   Social Connection and Isolation Panel    Frequency of Communication with Friends and Family: More than three times a week    Frequency of Social Gatherings with Friends and Family: More than three times a week    Attends Religious Services: More than 4 times per year    Active Member of [employer] or Organizations: Yes    Attends Banker Meetings: 1 to 4 times per year    Marital Status: Widowed  Recent Concern: Social Connections - Moderately Isolated (03/26/2024)   Social Connection and Isolation Panel    Frequency of Communication with Friends and Family: More than three times a week    Frequency of Social Gatherings with Friends and Family: Once a week    Attends Religious Services: Never    Database Administrator or Organizations: Yes    Attends Banker Meetings: 1 to 4 times per year    Marital Status: Widowed  Intimate Partner Violence: Not on file    Outpatient Medications Prior to Visit  Medication Sig Dispense Refill   albuterol  (PROVENTIL ) (2.5 MG/3ML) 0.083% nebulizer solution Take 3 mLs (2.5 mg total) by nebulization every 6 (six) hours as needed for wheezing or shortness of breath. 120 mL 12   albuterol  (VENTOLIN  HFA) 108 (90 Base) MCG/ACT inhaler Inhale 2 puffs into the lungs every 6 (six) hours as needed for wheezing or shortness of breath. 18 g 5   amphetamine -dextroamphetamine  (ADDERALL) 30 MG tablet Take 1 tablet by mouth 2 (two) times daily. 180 tablet 0   cetirizine (ZYRTEC) 10 MG tablet Take 10 mg by mouth daily.     citalopram (CELEXA) 40 MG tablet Take 1 tablet (40 mg total) by mouth  daily. 30 tablet 3   eletriptan  (RELPAX ) 40 MG tablet Take 1 tablet (40 mg total) by mouth as needed for migraine or headache. May repeat after 2 hours.  Maximum 2 tablets in 24 hours. 10 tablet 5   meclizine  (ANTIVERT ) 25 MG tablet TAKE 1 TABLET BY MOUTH 3 TIMES DAILY AS NEEDED FOR DIZZINESS. 30 tablet 0   meloxicam  (MOBIC ) 7.5  MG tablet TAKE 1 TO 2 TABLETS DAILY AS NEEDED FOR PAIN 180 tablet 2   norethindrone  (INCASSIA ) 0.35 MG tablet Take 1 tablet (0.35 mg total) by mouth daily. 28 tablet 11   tiZANidine  (ZANAFLEX ) 4 MG tablet Take 1 tablet (4 mg total) by mouth 3 (three) times daily as needed for muscle spasms. 90 tablet 5   traMADol  (ULTRAM ) 50 MG tablet TAKE 1 TABLET (50 MG TOTAL) BY MOUTH EVERY 8 (EIGHT) HOURS AS NEEDED (MOD PAIN). 30 tablet 0   TRELEGY ELLIPTA  200-62.5-25 MCG/ACT AEPB INHALE 1 PUFF BY MOUTH EVERY DAY 180 each 1   valACYclovir  (VALTREX ) 1000 MG tablet TAKE 1 TABLET BY MOUTH THREE TIMES A DAY 30 tablet 2   HYDROcodone -acetaminophen  (NORCO) 10-325 MG tablet Take 1 tablet by mouth every 6 (six) hours as needed for severe pain (pain score 7-10). 30 tablet 0   mometasone  (NASONEX ) 50 MCG/ACT nasal spray Place 2 sprays into the nose daily. (Patient taking differently: Place 2 sprays into the nose daily as needed (Allergies).) 51 each 4   ondansetron  (ZOFRAN -ODT) 8 MG disintegrating tablet TAKE 1 TABLET BY MOUTH EVERY 8 HOURS AS NEEDED FOR NAUSEA OR VOMITING. 20 tablet 1   Vitamin D , Ergocalciferol , (DRISDOL ) 1.25 MG (50000 UNIT) CAPS capsule TAKE 1 CAPSULE (50,000 UNITS TOTAL) BY MOUTH EVERY 7 (SEVEN) DAYS 12 capsule 1   benzonatate  (TESSALON ) 100 MG capsule Take 1 capsule (100 mg total) by mouth 3 (three) times daily as needed for cough. 30 capsule 0   botulinum toxin Type A  (BOTOX ) 200 units injection Inject 200 Units into the muscle every 3 (three) months. Inject 155 units IM into multiple site in the face,neck and head once every 90 days 1 each 4   cefdinir  (OMNICEF ) 300 MG  capsule Take 1 capsule (300 mg total) by mouth 2 (two) times daily. 20 capsule 0   fluconazole  (DIFLUCAN ) 150 MG tablet Take 1 tablet (150 mg total) by mouth daily. 1 tablet 0   predniSONE  (STERAPRED UNI-PAK 21 TAB) 10 MG (21) TBPK tablet take 60mg  day 1, then 50mg  day 2, then 40mg  day 3, then 30mg  day 4, then 20mg  day 5, then 10mg  day 6, then STOP 21 tablet 0   tirzepatide  (ZEPBOUND ) 2.5 MG/0.5ML Pen Inject 2.5 mg into the skin once a week. 2 mL 0   No facility-administered medications prior to visit.    Allergies  Allergen Reactions   Ketorolac Tromethamine Hives   Morphine Other (See Comments)    makes me the devil.   Seasonal Ic [Octacosanol]     Seasonal Allergies    Latex Rash and Other (See Comments)    Latex catheter caused extreme irritation    Review of Systems  Constitutional:  Negative for fever and malaise/fatigue.  HENT:  Positive for congestion and sore throat.   Eyes:  Negative for blurred vision.  Respiratory:  Negative for cough, sputum production, shortness of breath and wheezing.   Cardiovascular:  Negative for chest pain, palpitations and leg swelling.  Gastrointestinal:  Negative for abdominal pain, blood in stool and nausea.  Genitourinary:  Negative for dysuria and frequency.  Musculoskeletal:  Negative for falls.  Skin:  Negative for rash.  Neurological:  Negative for dizziness, loss of consciousness and headaches.  Endo/Heme/Allergies:  Negative for environmental allergies.  Psychiatric/Behavioral:  Negative for depression. The patient is not nervous/anxious.        Objective:    Physical Exam Vitals and nursing note reviewed.  Constitutional:  General: She is not in acute distress.    Appearance: Normal appearance. She is well-developed.  HENT:     Head: Normocephalic and atraumatic.     Mouth/Throat:     Pharynx: Posterior oropharyngeal erythema present. No oropharyngeal exudate.  Eyes:     General: No scleral icterus.       Right eye:  No discharge.        Left eye: No discharge.  Cardiovascular:     Rate and Rhythm: Normal rate and regular rhythm.     Heart sounds: No murmur heard. Pulmonary:     Effort: Pulmonary effort is normal. No respiratory distress.     Breath sounds: Normal breath sounds.  Musculoskeletal:        General: Normal range of motion.     Cervical back: Normal range of motion and neck supple.     Right lower leg: No edema.     Left lower leg: No edema.  Skin:    General: Skin is warm and dry.  Neurological:     Mental Status: She is alert and oriented to person, place, and time.  Psychiatric:        Mood and Affect: Mood normal.        Behavior: Behavior normal.        Thought Content: Thought content normal.        Judgment: Judgment normal.     BP (!) 140/80 (BP Location: Left Arm, Patient Position: Sitting, Cuff Size: Large)   Pulse 82   Temp 99.1 F (37.3 C) (Oral)   Resp 18   Ht 5' 7 (1.702 m)   Wt (!) 334 lb 9.6 oz (151.8 kg)   LMP 05/25/2021   SpO2 97%   BMI 52.41 kg/m  Wt Readings from Last 3 Encounters:  05/26/24 (!) 334 lb 9.6 oz (151.8 kg)  04/02/24 (!) 331 lb (150.1 kg)  03/28/24 (!) 335 lb 12.8 oz (152.3 kg)    Diabetic Foot Exam - Simple   No data filed    Lab Results  Component Value Date   WBC 7.2 03/28/2024   HGB 14.4 03/28/2024   HCT 44.4 03/28/2024   PLT 267 03/28/2024   GLUCOSE 100 (H) 03/28/2024   CHOL 195 03/28/2024   TRIG 65 03/28/2024   HDL 84 03/28/2024   LDLCALC 96 03/28/2024   ALT 27 03/28/2024   AST 20 03/28/2024   NA 140 03/28/2024   K 4.9 03/28/2024   CL 102 03/28/2024   CREATININE 0.73 03/28/2024   BUN 12 03/28/2024   CO2 30 03/28/2024   TSH 0.95 03/28/2024   HGBA1C 5.5 03/28/2024    Lab Results  Component Value Date   TSH 0.95 03/28/2024   Lab Results  Component Value Date   WBC 7.2 03/28/2024   HGB 14.4 03/28/2024   HCT 44.4 03/28/2024   MCV 87.7 03/28/2024   PLT 267 03/28/2024   Lab Results  Component Value Date    NA 140 03/28/2024   K 4.9 03/28/2024   CO2 30 03/28/2024   GLUCOSE 100 (H) 03/28/2024   BUN 12 03/28/2024   CREATININE 0.73 03/28/2024   BILITOT 0.6 03/28/2024   ALKPHOS 69 11/26/2023   AST 20 03/28/2024   ALT 27 03/28/2024   PROT 7.1 03/28/2024   ALBUMIN 3.7 11/26/2023   CALCIUM 9.5 03/28/2024   ANIONGAP 8 11/26/2023   EGFR 99 03/28/2024   GFR 100.75 02/06/2023   Lab Results  Component Value Date  CHOL 195 03/28/2024   Lab Results  Component Value Date   HDL 84 03/28/2024   Lab Results  Component Value Date   LDLCALC 96 03/28/2024   Lab Results  Component Value Date   TRIG 65 03/28/2024   Lab Results  Component Value Date   CHOLHDL 2.3 03/28/2024   Lab Results  Component Value Date   HGBA1C 5.5 03/28/2024       Assessment & Plan:  Assessment and Plan Assessment & Plan Acute pharyngitis (strep throat)   Acute pharyngitis is confirmed by a positive strep test. Her throat is erythematous, but there is no lymphadenopathy. She has no penicillin  allergies. Discussed potential side effects of treatment, including nausea and vomiting, which could lead to hematemesis if severe. Prescribe [facility]. Provide Diflucan  as requested. Provide a doctor's note for absence from work for yesterday and today. Advise returning to work after 24 hours on antibiotics.     Jaz Laningham R Lowne Chase, DO

## 2024-05-28 ENCOUNTER — Ambulatory Visit (INDEPENDENT_AMBULATORY_CARE_PROVIDER_SITE_OTHER): Admitting: Medical

## 2024-05-28 ENCOUNTER — Ambulatory Visit: Payer: Self-pay | Admitting: *Deleted

## 2024-05-28 ENCOUNTER — Encounter: Payer: Self-pay | Admitting: Medical

## 2024-05-28 VITALS — BP 138/90 | HR 86 | Temp 98.2°F | Resp 18 | Ht 67.0 in | Wt 334.6 lb

## 2024-05-28 DIAGNOSIS — J029 Acute pharyngitis, unspecified: Secondary | ICD-10-CM | POA: Diagnosis not present

## 2024-05-28 DIAGNOSIS — J3489 Other specified disorders of nose and nasal sinuses: Secondary | ICD-10-CM | POA: Diagnosis not present

## 2024-05-28 MED ORDER — CEFTRIAXONE SODIUM 1 G IJ SOLR
1.0000 g | Freq: Once | INTRAMUSCULAR | Status: AC
  Administered 2024-05-28: 1 g via INTRAMUSCULAR

## 2024-05-28 NOTE — Patient Instructions (Addendum)
 Acute streptococcal pharyngitis Persistent severe throat pain despite 3 daysamoxicillin. Possible resistant strain or early treatment stage. - Administered Rocephin 1 gram IM. - Continue Augmentin . - Administer Tylenol  325-500 mg with ibuprofen  200-400 mg for pain. - Provided Diflucan  for potential yeast infection. -tylenol  and ibuprofen  as discussed for pain.   Acute sinusitis possible based on exam tenderness. Sinus tenderness over maxillary and frontal sinuses. Possible sinus infection. - Continue mometasone  (Flonase). - Administered Rocephin 1 gram IM.  Follow up in 10 days or sooner if needed

## 2024-05-28 NOTE — Telephone Encounter (Signed)
 FYI Only or Action Required?: FYI only for provider: appointment scheduled on 11/5.  Patient was last seen in primary care on 05/26/2024 by Antonio Meth, Jamee SAUNDERS, DO.  Called Nurse Triage reporting Sore Throat.  Symptoms began several days ago.  Interventions attempted: amoxicillin  (AMOXIL ) 875 MG tablet .  Symptoms are: gradually worsening.  Triage Disposition: See Physician Within 24 Hours  Patient/caregiver understands and will follow disposition?: Yes  Copied from CRM [id number]. Topic: Clinical - Red Word Triage >> May 28, 2024 12:26 PM Jo Jackson: Red Word that prompted transfer to Nurse Triage: Patient was seen on 05/26/2024 and diagnosed with strep throat, she was given medication; however, medication does not seem to be helping, she has a severe sore throat that is extremely painful. Reason for Disposition  [1] Taking antibiotic > 72 hours (3 days) for strep throat AND [2] sore throat not improved  Answer Assessment - Initial Assessment Questions 1. SYMPTOM: What's the main symptom you're concerned about? (e.g., fever, difficulty swallowing, sore throat)     Throat hurts worse 2. ANTIBIOTIC: What antibiotic are you taking? How many times a day?     amoxicillin  (AMOXIL ) 875 MG tablet bid 3. ONSET: When was the antibiotic started?     10/31 4. THROAT PAIN:  How bad is the sore throat? (Scale 1-10; mild, moderate or severe)     7/10 5. FEVER: Do you have a fever? If Yes, ask: What is your temperature, how was it measured, and when did it start?     no 6. OTHER SYMPTOMS: Do you have any other symptoms? (e.g., rash)     no 7. BETTER-SAME-WORSE: Are you getting better, staying the same, or getting worse compared to the day you started the antibiotics?     worse  Protocols used: Strep Throat Infection on Antibiotic Follow-up Call-[facility]

## 2024-05-28 NOTE — Progress Notes (Signed)
   Subjective:    Patient ID: Jo Jackson, female    DOB: 1971/04/19, 53 y.o.   MRN: [id number]  HPI  Jo Jackson is a 53 year old female who presents with persistent sore throat and sinus pressure despite antibiotic treatment for strep throat.  She has been experiencing a persistent sore throat that has not improved despite starting amoxicillin  875 mg on Monday. She has taken five doses so far, with two doses on Monday, two on Tuesday, and one today. The pain is described as severe, with her stating 'my throat hurts so freaking bad.'  In addition to the sore throat, she has sinus pressure and tenderness, particularly over the maxillary sinuses, which she notices when leaning her head down. She has sinus tenderness but no nasal drainage or cough. Her sister noted she was snoring, but she is not aware of any drainage or coughing when lying down.  She has a history of strep throat, although it has been a long time since her last episode. She is currently taking ibuprofen  800 mg for pain relief, which she took at about noon today. She also has Mucinex DM and mometasone  in her medicine bag for sinus symptoms.  She notes that her glands are slightly swollen, a new development as they had not been swollen before. No history of allergies to antibiotics.  She is concerned about her ability to return to work, as she has not been able to work this week and is worried about losing her medical benefits if she does not clock in. She works at [employer].  Review of Systems See hpi    Objective:   Physical Exam  General- No acute distress. Pleasant patient. Neck- Full range of motion, no jvd Lungs- Clear, even and unlabored. Heart- regular rate and rhythm. Neurologic- CNII- XII grossly intact.  HEENT: Throat red and mild symmetric hypertrophied tonsils bilaterally, no exudate. Maxillary  and frontal sinus moderate tender. NECK: Slightly enlarged lymph node left submandibular area. No neck  stiffness. Normal range of motion.      Assessment & Plan:   Acute streptococcal pharyngitis Persistent severe throat pain despite 3 daysamoxicillin. Possible resistant strain or early treatment stage. - Administered Rocephin 1 gram IM. - Continue Augmentin . - Administer Tylenol  325-500 mg with ibuprofen  200-400 mg for pain. - Provided Diflucan  for potential yeast infection. -tylenol  and ibuprofen  as discussed for pain.   Acute sinusitis possible based on exam tenderness. Sinus tenderness over maxillary and frontal sinuses. Possible sinus infection. - Continue mometasone  (Flonase). - Administered Rocephin 1 gram IM.  Follow up in 10 days or sooner if needed  [employer], PA-C

## 2024-05-31 ENCOUNTER — Encounter: Payer: Self-pay | Admitting: Neurology

## 2024-05-31 DIAGNOSIS — G43709 Chronic migraine without aura, not intractable, without status migrainosus: Secondary | ICD-10-CM

## 2024-06-02 ENCOUNTER — Other Ambulatory Visit: Payer: Self-pay | Admitting: Pharmacy Technician

## 2024-06-02 ENCOUNTER — Encounter: Payer: Self-pay | Admitting: Family Medicine

## 2024-06-02 ENCOUNTER — Other Ambulatory Visit (HOSPITAL_COMMUNITY): Payer: Self-pay

## 2024-06-02 MED ORDER — ONABOTULINUMTOXINA 200 UNITS IJ SOLR: INTRAMUSCULAR | 4 refills | Status: DC

## 2024-06-02 NOTE — Progress Notes (Signed)
 Pt insurance requires her to use [facility]. No longer will be using our services to dispense Botox .

## 2024-06-02 NOTE — Telephone Encounter (Signed)
 Pt has been dis enrolled with [facility]. PA is still active until 2.2026. Please send new script to [facility].

## 2024-06-02 NOTE — Telephone Encounter (Signed)
 Yes, I checked [facility] and it's [facility]

## 2024-06-05 ENCOUNTER — Telehealth: Payer: Self-pay

## 2024-06-05 ENCOUNTER — Ambulatory Visit: Admitting: Family Medicine

## 2024-06-05 NOTE — Telephone Encounter (Signed)
 Copied from CRM #[id number]. Topic: Clinical - Medication Question >> Jun 05, 2024 12:56 PM Shereese L wrote: Reason for CRM: Patient is requesting a Doctors note for today from work. She stated that she has a bad case of Diarrhea and stuck in the car because if she moves it'll be tragic. She stated that due to the combination of medication she's taking its causing stomach issues A note was written for the issue on 11/11 and that the issue continuing

## 2024-06-06 ENCOUNTER — Other Ambulatory Visit: Payer: Self-pay | Admitting: Family Medicine

## 2024-06-06 DIAGNOSIS — R197 Diarrhea, unspecified: Secondary | ICD-10-CM

## 2024-06-06 NOTE — Telephone Encounter (Signed)
 Letter sent via Mychart

## 2024-06-09 ENCOUNTER — Ambulatory Visit: Admitting: Family Medicine

## 2024-07-01 ENCOUNTER — Other Ambulatory Visit (HOSPITAL_COMMUNITY): Payer: Self-pay

## 2024-07-04 ENCOUNTER — Encounter: Payer: Self-pay | Admitting: Pulmonary Disease

## 2024-07-04 ENCOUNTER — Ambulatory Visit: Admitting: Neurology

## 2024-07-04 ENCOUNTER — Ambulatory Visit: Admitting: Pulmonary Disease

## 2024-07-04 DIAGNOSIS — J4550 Severe persistent asthma, uncomplicated: Secondary | ICD-10-CM | POA: Diagnosis not present

## 2024-07-04 DIAGNOSIS — J40 Bronchitis, not specified as acute or chronic: Secondary | ICD-10-CM | POA: Diagnosis not present

## 2024-07-04 DIAGNOSIS — G43709 Chronic migraine without aura, not intractable, without status migrainosus: Secondary | ICD-10-CM | POA: Diagnosis not present

## 2024-07-04 DIAGNOSIS — Z87891 Personal history of nicotine dependence: Secondary | ICD-10-CM | POA: Diagnosis not present

## 2024-07-04 DIAGNOSIS — J4540 Moderate persistent asthma, uncomplicated: Secondary | ICD-10-CM

## 2024-07-04 MED ORDER — ONABOTULINUMTOXINA 100 UNITS IJ SOLR
200.0000 [IU] | Freq: Once | INTRAMUSCULAR | Status: AC
  Administered 2024-07-04: 155 [IU] via INTRAMUSCULAR

## 2024-07-04 MED ORDER — TRELEGY ELLIPTA 200-62.5-25 MCG/ACT IN AEPB: 1.0000 | INHALATION_SPRAY | Freq: Every day | RESPIRATORY_TRACT | 4 refills | Status: AC

## 2024-07-04 NOTE — Patient Instructions (Signed)
 Nice to see you again  No change in the medication  Trelegy refilled  Return to clinic in 1 year or sooner as needed

## 2024-07-04 NOTE — Progress Notes (Signed)
 @Patient  ID: Jo Jackson, female    DOB: [birth year], 53 y.o.   MRN: [id number]  Chief Complaint  Patient presents with   Medical Management of Chronic Issues    Pt states no new concerns     Referring provider: Antonio Cyndee Jamee JONELLE, DO  HPI:   53 y.o. woman whom are seen in consultation for evaluation of cough, dyspnea, bronchitis symptoms, history of asthma.  Most recent PCP note reviewed.  Most recent neurology note reviewed.  Overall seem to be doing well.  Maintained on Trelegy.  For asthma.  No significant flares since last visit.  Has gotten some steroid injections for joint pain, arthritis.  None for breathing.  Overall seems to be doing fine.  HPI initial visit: She notes history of recurrent bronchitis about once a year.  Usually in December.  No more than once a year but reliably usually once a year.  Symptoms last a week or so.  Cough sputum production etc.  Gradually improved.  She has been dealing with recurrent case of bronchitis for the last month.  Ongoing cough.  Lasting much longer than typical.  Mild dyspnea.  Some chest discomfort or tightness.  No real pain but sensation of not getting a deep breath in her upper chest.  She is been using Advair mid dose Diskus twice daily with mild improvement.  Albuterol  helps some.  Has use of prednisone  course and that seemed to help but only for a few days and symptoms recur.  She has had lingering worsening mild dyspnea cough at work for the preceding months.  Associated with mildew exposure.  Concern for mold exposure.  A week out of work and things are a bit better.  Not a lot improved on the weekends when she is not working.  Otherwise, no time of day when symptoms are better or worse.  No consistent weakness or worse.  No other alleviating or exacerbating factors.  She had a chest x-ray 01/2022 on my review interpretation reveals clear lungs with mild hyperinflation on the lateral view.  Compared to previous chest x-ray  05/2021 similar appearance of mild hyperinflation on the lateral with clear lungs otherwise on my review interpretation.  PMH: Asthma, migraines Surgical history: Cervical spine surgery, appendectomy Family history: Migraines, COPD, CHF in mother, father with Hodgkin's lymphoma Social history: Never smoker, lives in [place], is the wife of Jo Jackson who many our group took care of after prolonged COVID hospitalization and subsequent respiratory failure and tracheostomy dependence who unfortunately passed away in summer 2022   Questionaires / Pulmonary Flowsheets:   ACT:  Asthma Control Test ACT Total Score  03/03/2022  9:54 AM 14    MMRC:     No data to display          Epworth:      No data to display          Tests:   FENO:  No results found for: NITRICOXIDE  PFT:    Latest Ref Rng & Units 10/19/2023   12:15 PM  PFT Results  FVC-Pre L 3.51   FVC-Predicted Pre % 90   FVC-Post L 3.37   FVC-Predicted Post % 86   Pre FEV1/FVC % % 78   Post FEV1/FCV % % 83   FEV1-Pre L 2.74   FEV1-Predicted Pre % 89   FEV1-Post L 2.79   DLCO uncorrected ml/min/mmHg 25.88   DLCO UNC% % 111   DLCO corrected ml/min/mmHg 25.28   DLCO COR %  Predicted % 109   DLVA Predicted % 115   TLC L 5.37   TLC % Predicted % 97   RV % Predicted % 90   Personally viewed interpreted as normal spirometry, no bronchodilator response, lung volumes within normals, DLCO within normal limits.  WALK:      No data to display          Imaging: Personally reviewed and as per EMR and discussion in this note No results found.  Lab Results: Personally reviewed CBC    Component Value Date/Time   WBC 7.2 03/28/2024 1413   RBC 5.06 03/28/2024 1413   HGB 14.4 03/28/2024 1413   HCT 44.4 03/28/2024 1413   PLT 267 03/28/2024 1413   MCV 87.7 03/28/2024 1413   MCH 28.5 03/28/2024 1413   MCHC 32.4 03/28/2024 1413   RDW 12.7 03/28/2024 1413   LYMPHSABS 1.8 02/06/2023 0947   MONOABS 0.4  02/06/2023 0947   EOSABS 50 03/28/2024 1413   BASOSABS 43 03/28/2024 1413    BMET    Component Value Date/Time   NA 140 03/28/2024 1413   K 4.9 03/28/2024 1413   CL 102 03/28/2024 1413   CO2 30 03/28/2024 1413   GLUCOSE 100 (H) 03/28/2024 1413   BUN 12 03/28/2024 1413   CREATININE 0.73 03/28/2024 1413   CALCIUM 9.5 03/28/2024 1413   GFRNONAA >60 11/26/2023 1044   GFRAA >90 12/13/2012 1900    BNP No results found for: BNP  ProBNP No results found for: PROBNP  Specialty Problems       Pulmonary Problems   SINUSITIS - ACUTE-NOS   Qualifier: Diagnosis of  By: Mahlon MD, Jo        Bronchitis   Viral URI   Pansinusitis   Influenza   Acute cough   Pharyngitis   Strep throat    Allergies  Allergen Reactions   Ketorolac Tromethamine Hives   Morphine Other (See Comments)    makes me the devil.   Seasonal Ic [Octacosanol]     Seasonal Allergies    Latex Rash and Other (See Comments)    Latex catheter caused extreme irritation    Immunization History  Administered Date(s) Administered   Hepb-cpg 04/06/2022, 03/20/2023   Influenza Whole 04/26/2009, 04/21/2010   Influenza, Mdck, Trivalent,PF 6+ MOS(egg free) 03/20/2023, 04/11/2024   Influenza,inj,Quad PF,6+ Mos 04/23/2013, 04/22/2015, 05/11/2016, 05/10/2017, 03/22/2018, 05/23/2019, 06/19/2020, 04/15/2021, 03/03/2022   Influenza-Unspecified 06/19/2020   Janssen (J&J) SARS-COV-2 Vaccination 10/04/2019, 06/19/2020   Moderna Covid-19 Fall Seasonal Vaccine 26yrs & older 04/11/2024   Moderna Sars-Covid-2 Vaccination 06/19/2020   PFIZER(Purple Top)SARS-COV-2 Vaccination 04/01/2021   PNEUMOCOCCAL CONJUGATE-20 04/01/2021, 03/16/2022   Tdap 06/24/2022   Zoster Recombinant(Shingrix) 03/03/2022, 03/20/2023    Past Medical History:  Diagnosis Date   Abnormal Pap smear    Repeats WNL   ADD (attention deficit disorder)    Anxiety    Arthritis    Oseteoarthritis   Complication of anesthesia    Hard time  waking up   Depression    Fibromyalgia    GERD (gastroesophageal reflux disease)    Headache(784.0)    Migraines   Junctional escape rhythm (HCC)    PONV (postoperative nausea and vomiting)    Pregnancy induced hypertension    Sleep apnea    Supraventricular tachycardia     Tobacco History: Social History   Tobacco Use  Smoking Status Former   Current packs/day: 0.00   Types: Cigarettes   Quit date: 10/23/2007   Years since quitting:  16.7  Smokeless Tobacco Never   Counseling given: Not Answered   Continue to not smoke  Outpatient Encounter Medications as of 07/04/2024  Medication Sig   albuterol  (PROVENTIL ) (2.5 MG/3ML) 0.083% nebulizer solution Take 3 mLs (2.5 mg total) by nebulization every 6 (six) hours as needed for wheezing or shortness of breath.   albuterol  (VENTOLIN  HFA) 108 (90 Base) MCG/ACT inhaler Inhale 2 puffs into the lungs every 6 (six) hours as needed for wheezing or shortness of breath.   amphetamine -dextroamphetamine  (ADDERALL) 30 MG tablet Take 1 tablet by mouth 2 (two) times daily.   botulinum toxin Type A  (BOTOX ) 200 units injection Inject 155 units IM into multiple site in the face,neck and head once every 90 days   cetirizine (ZYRTEC) 10 MG tablet Take 10 mg by mouth daily.   citalopram (CELEXA) 40 MG tablet Take 1 tablet (40 mg total) by mouth daily.   eletriptan  (RELPAX ) 40 MG tablet Take 1 tablet (40 mg total) by mouth as needed for migraine or headache. May repeat after 2 hours.  Maximum 2 tablets in 24 hours.   fluconazole  (DIFLUCAN ) 150 MG tablet Take 1 tablet (150 mg total) by mouth daily. May repeat in 3 days if needed.   HYDROcodone -acetaminophen  (NORCO) 10-325 MG tablet Take 1 tablet by mouth every 6 (six) hours as needed for severe pain (pain score 7-10).   meclizine  (ANTIVERT ) 25 MG tablet TAKE 1 TABLET BY MOUTH 3 TIMES DAILY AS NEEDED FOR DIZZINESS.   meloxicam  (MOBIC ) 7.5 MG tablet TAKE 1 TO 2 TABLETS DAILY AS NEEDED FOR PAIN   mometasone   (NASONEX ) 50 MCG/ACT nasal spray Place 2 sprays into the nose daily.   norethindrone  (INCASSIA ) 0.35 MG tablet Take 1 tablet (0.35 mg total) by mouth daily.   ondansetron  (ZOFRAN -ODT) 8 MG disintegrating tablet Take 1 tablet (8 mg total) by mouth every 8 (eight) hours as needed.   tiZANidine  (ZANAFLEX ) 4 MG tablet Take 1 tablet (4 mg total) by mouth 3 (three) times daily as needed for muscle spasms.   traMADol  (ULTRAM ) 50 MG tablet TAKE 1 TABLET (50 MG TOTAL) BY MOUTH EVERY 8 (EIGHT) HOURS AS NEEDED (MOD PAIN).   valACYclovir  (VALTREX ) 1000 MG tablet TAKE 1 TABLET BY MOUTH THREE TIMES A DAY   Vitamin D , Ergocalciferol , (DRISDOL ) 1.25 MG (50000 UNIT) CAPS capsule Take 1 capsule (50,000 Units total) by mouth every 7 (seven) days.   [DISCONTINUED] TRELEGY ELLIPTA  200-62.5-25 MCG/ACT AEPB INHALE 1 PUFF BY MOUTH EVERY DAY   Fluticasone -Umeclidin-Vilant (TRELEGY ELLIPTA ) 200-62.5-25 MCG/ACT AEPB Inhale 1 puff into the lungs daily.   No facility-administered encounter medications on file as of 07/04/2024.     Review of Systems  Review of Systems  N/a Physical Exam  BP (!) 147/91 (BP Location: Left Arm, Cuff Size: Large)   Pulse 80   Ht 5' 7 (1.702 m) Comment: per pt  Wt (!) 326 lb (147.9 kg)   LMP 05/25/2021   SpO2 97%   BMI 51.06 kg/m   Wt Readings from Last 5 Encounters:  07/04/24 (!) 326 lb (147.9 kg)  05/28/24 (!) 334 lb 9.6 oz (151.8 kg)  05/26/24 (!) 334 lb 9.6 oz (151.8 kg)  04/02/24 (!) 331 lb (150.1 kg)  03/28/24 (!) 335 lb 12.8 oz (152.3 kg)    BMI Readings from Last 5 Encounters:  07/04/24 51.06 kg/m  05/28/24 52.41 kg/m  05/26/24 52.41 kg/m  04/02/24 51.84 kg/m  03/28/24 52.59 kg/m     Physical Exam General: Sitting up,  no distress Eyes: EOMI Neck: No JVP Pulmonary: Clear, distant, normal work of breathing on room air Abdomen: Nondistended   Assessment & Plan:   Asthma, severe persistent: Longstanding asthma.  Overall improved with high-dose Trelegy.   No exacerbations in interim since last visit.  Trelegy refilled.  Continue albuterol  nebulizer and HFA as needed.  Recurrent bronchitis: Likely site of previously poorly controlled asthma.  Minimal or none recently with good adherence to Trelegy.   Return in about 1 year (around 07/04/2025) for f/u Dr. Annella.   Donnice Jackson Annella, MD 07/04/2024

## 2024-07-04 NOTE — Progress Notes (Signed)

## 2024-07-08 ENCOUNTER — Encounter: Payer: Self-pay | Admitting: Neurology

## 2024-07-13 ENCOUNTER — Encounter: Payer: Self-pay | Admitting: Neurology

## 2024-07-21 ENCOUNTER — Other Ambulatory Visit: Payer: Self-pay | Admitting: Family Medicine

## 2024-07-21 DIAGNOSIS — G43809 Other migraine, not intractable, without status migrainosus: Secondary | ICD-10-CM

## 2024-07-21 NOTE — Telephone Encounter (Signed)
 Requesting: tramadol  50mg   Contract: 03/28/24 UDS: 03/28/24 Last Visit: 05/28/24 Next Visit: None Last Refill: 11/05/23 #30 and 0RF   Please Advise

## 2024-08-12 ENCOUNTER — Encounter: Payer: Self-pay | Admitting: Pulmonary Disease

## 2024-08-12 ENCOUNTER — Telehealth: Admitting: Physician Assistant

## 2024-08-12 DIAGNOSIS — J069 Acute upper respiratory infection, unspecified: Secondary | ICD-10-CM

## 2024-08-12 MED ORDER — PROMETHAZINE-DM 6.25-15 MG/5ML PO SYRP: 5.0000 mL | ORAL_SOLUTION | Freq: Four times a day (QID) | ORAL | 0 refills | Status: AC | PRN

## 2024-08-12 MED ORDER — IPRATROPIUM BROMIDE 0.03 % NA SOLN: 2.0000 | Freq: Two times a day (BID) | NASAL | 0 refills | Status: AC

## 2024-08-12 MED ORDER — BENZONATATE 200 MG PO CAPS: 200.0000 mg | ORAL_CAPSULE | Freq: Three times a day (TID) | ORAL | 1 refills | Status: AC | PRN

## 2024-08-12 NOTE — Progress Notes (Signed)
 We are sorry you are not feeling well.  Here is how we plan to help!  Based on what you have shared with me, it looks like you may have a viral upper respiratory infection.  Upper respiratory infections are caused by a large number of viruses; however, rhinovirus is the most common cause.   Symptoms vary from person to person, with common symptoms including sore throat, cough, and fatigue or lack of energy, and a feeling of general discomfort.  A low-grade fever of up to 100.4 may present, but is often uncommon.  Symptoms vary however, and are closely related to a person's age or underlying illnesses.  The most common symptoms associated with an upper respiratory infection are nasal discharge or congestion, cough, sneezing, headache and pressure in the ears and face.  These symptoms usually persist for about 3 to 10 days, but can last up to 2 weeks.  It is important to know that upper respiratory infections do not cause serious illness or complications in most cases.    Upper respiratory infections can be transmitted from person to person, with the most common method of transmission being a person's hands.  The virus is able to live on the skin and can infect other persons for up to 2 hours after direct contact.  Also, these can be transmitted when someone coughs or sneezes; thus, it is important to cover the mouth to reduce this risk.  To keep the spread of the illness at bay, good hand hygiene is very important!  Because this is a viral infection, there are no specific treatments other than to help you with the symptoms until the infection runs its course.    For nasal congestion, you may use an oral decongestants such as Mucinex D or if you have glaucoma or high blood pressure use plain Mucinex.  Saline nasal spray or nasal drops can help and can safely be used as often as needed for congestion.  For your congestion, I have prescribed Ipratropium Bromide nasal spray 0.03% two sprays in each nostril 2-3  times a day  If you do not have a history of heart disease, hypertension, diabetes or thyroid disease, prostate/bladder issues or glaucoma, you may also use Sudafed to treat nasal congestion.  It is highly recommended that you consult with a pharmacist or your primary care physician to ensure this medication is safe for you to take.     If you have a cough, you may use over-the-counter cough suppressants such as Delsym and Robitussin.  If you have glaucoma or high blood pressure, you can also use Coricidin HBP.   For cough I have prescribed for you A prescription cough medication called Promethazine-DM 6.25-15 mg/5 mL. Take 5 mL every 6 hours as needed for cough.  If you have a sore or scratchy throat, use a saltwater gargle-  to  teaspoon of salt dissolved in a 4-ounce to 8-ounce glass of warm water.  Gargle the solution for approximately 15-30 seconds and then spit.  It is important not to swallow the solution.  You can also use throat lozenges/cough drops and Chloraseptic spray to help with throat pain or discomfort.  Warm or cold liquids can also be helpful in relieving throat pain.   For headache, pain, or general discomfort, you can use Ibuprofen  or Tylenol as directed.   Some authorities believe that zinc sprays or the use of Echinacea may shorten the course of your symptoms.   I have sent a work note to  your MyChart. You can find by going to the Menu on your homepage, scrolling down to the Communications section, and selecting Letters. Let us  know if you have any issue locating. Take care and feel better soon!   HOME CARE Only take medications as instructed by your medical team. Be sure to drink plenty of fluids. Water is fine as well as fruit juices, sodas and electrolyte beverages. You may want to stay away from caffeine or alcohol. If you are nauseated, try taking small sips of liquids. How do you know if you are getting enough fluid? Your urine should be a pale yellow or almost  colorless. Get rest. Taking a steamy shower or using a humidifier may help nasal congestion and ease sore throat pain. You can place a towel over your head and breathe in the steam from hot water coming from a faucet. Using a saline nasal spray works much the same way. Cough drops, hard candies and sore throat lozenges may ease your cough. Avoid close contacts especially the very young and the elderly Cover your mouth if you cough or sneeze Always remember to wash your hands.   GET HELP RIGHT AWAY IF: You develop worsening fever. If your symptoms do not improve within 10 days You develop yellow or green discharge from your nose over 3 days. You have coughing fits You develop a severe head ache or visual changes. You develop shortness of breath, difficulty breathing or start having chest pain Your symptoms persist after you have completed your treatment plan  MAKE SURE YOU  Understand these instructions. Will watch your condition. Will get help right away if you are not doing well or get worse.  Your e-visit answers were reviewed by a board certified advanced clinical practitioner to complete your personal care plan. Depending upon the condition, your plan could have included both over-the-counter or prescription medications.  Please review your pharmacy choice. If there is a problem, you may call our nursing hot line at and have the prescription routed to another pharmacy. Your safety is important to us . If you have drug allergies, check your prescription carefully.   You can use MyChart to ask questions about today's visit, request a non-urgent call back, or ask for a work or school excuse for 24 hours related to this e-Visit. If it has been greater than 24 hours you will need to follow up with your provider, or enter a new e-Visit to address those concerns. You will get an e-mail in the next two days asking about your experience.  I hope that your e-visit has been valuable and will  speed your recovery. Thank you for using e-visits.   I have spent 5 minutes in review of e-visit questionnaire, review and updating patient chart, medical decision making and response to patient.   Elsie Velma Lunger, PA-C

## 2024-08-19 ENCOUNTER — Telehealth: Payer: Self-pay | Admitting: Pharmacy Technician

## 2024-08-19 ENCOUNTER — Other Ambulatory Visit (HOSPITAL_COMMUNITY): Payer: Self-pay

## 2024-08-19 DIAGNOSIS — G43709 Chronic migraine without aura, not intractable, without status migrainosus: Secondary | ICD-10-CM

## 2024-08-19 NOTE — Telephone Encounter (Signed)
 Pharmacy Patient Advocate Encounter   Received notification from Pt Calls Messages that prior authorization for BOTOX  200 is required/requested.   Insurance verification completed.   The patient is insured through [facility].   Per test claim: PA required; PA submitted to above mentioned insurance via Latent Key/confirmation #/EOC [facility] Status is pending

## 2024-08-19 NOTE — Telephone Encounter (Deleted)
 Pharmacy Patient Advocate Encounter   Received notification from Pt Calls Messages that prior authorization for BOTOX  200 is required/requested.   Insurance verification completed.   The patient is insured through [facility].   Per test claim: PA required; PA submitted to above mentioned insurance via Latent Key/confirmation #/EOC [facility] Status is pending

## 2024-08-19 NOTE — Telephone Encounter (Signed)
 A user error has taken place: encounter opened in error, closed for administrative reasons.

## 2024-08-20 ENCOUNTER — Encounter: Payer: Self-pay | Admitting: Family Medicine

## 2024-08-20 ENCOUNTER — Telehealth: Admitting: Family Medicine

## 2024-08-20 DIAGNOSIS — J019 Acute sinusitis, unspecified: Secondary | ICD-10-CM | POA: Diagnosis not present

## 2024-08-20 DIAGNOSIS — B9689 Other specified bacterial agents as the cause of diseases classified elsewhere: Secondary | ICD-10-CM | POA: Diagnosis not present

## 2024-08-20 MED ORDER — AMOXICILLIN-POT CLAVULANATE 875-125 MG PO TABS: 1.0000 | ORAL_TABLET | Freq: Two times a day (BID) | ORAL | 0 refills | Status: AC

## 2024-08-20 NOTE — Progress Notes (Signed)
 E-Visit for Sinus Problems  We are sorry that you are not feeling well.  Here is how we plan to help!  Based on what you have shared with me it looks like you have sinusitis.  Sinusitis is inflammation and infection in the sinus cavities of the head.  Based on your presentation I believe you most likely have Acute Bacterial Sinusitis.  This is an infection caused by bacteria and is treated with antibiotics. I have prescribed Augmentin  875mg /125mg  one tablet twice daily with food, for 7 days. You may use an oral decongestant such as Mucinex D or if you have glaucoma or high blood pressure use plain Mucinex. Saline nasal spray help and can safely be used as often as needed for congestion.  If you develop worsening sinus pain, fever or notice severe headache and vision changes, or if symptoms are not better after completion of antibiotic, please schedule an appointment with a health care provider.    Sinus infections are not as easily transmitted as other respiratory infection, however we still recommend that you avoid close contact with loved ones, especially the very young and elderly.  Remember to wash your hands thoroughly throughout the day as this is the number one way to prevent the spread of infection!  Home Care: Only take medications as instructed by your medical team. Complete the entire course of an antibiotic. Do not take these medications with alcohol. A steam or ultrasonic humidifier can help congestion.  You can place a towel over your head and breathe in the steam from hot water coming from a faucet. Avoid close contacts especially the very young and the elderly. Cover your mouth when you cough or sneeze. Always remember to wash your hands.  Get Help Right Away If: You develop worsening fever or sinus pain. You develop a severe head ache or visual changes. Your symptoms persist after you have completed your treatment plan.  Make sure you Understand these instructions. Will  watch your condition. Will get help right away if you are not doing well or get worse.  Your e-visit answers were reviewed by a board certified advanced clinical practitioner to complete your personal care plan.  Depending on the condition, your plan could have included both over the counter or prescription medications.  If there is a problem please reply  once you have received a response from your provider.  Your safety is important to us .  If you have drug allergies check your prescription carefully.    You can use MyChart to ask questions about today's visit, request a non-urgent call back, or ask for a work or school excuse for 24 hours related to this e-Visit. If it has been greater than 24 hours you will need to follow up with your provider, or enter a new e-Visit to address those concerns.  You will get an e-mail in the next two days asking about your experience.  I hope that your e-visit has been valuable and will speed your recovery. Thank you for using e-visits.  I have spent 5 minutes in review of e-visit questionnaire, review and updating patient chart, medical decision making and response to patient.   Chiquita CHRISTELLA Barefoot, NP

## 2024-08-25 ENCOUNTER — Other Ambulatory Visit (HOSPITAL_COMMUNITY): Payer: Self-pay

## 2024-08-27 ENCOUNTER — Other Ambulatory Visit: Payer: Self-pay | Admitting: Pharmacy Technician

## 2024-08-27 ENCOUNTER — Other Ambulatory Visit (HOSPITAL_COMMUNITY): Payer: Self-pay

## 2024-08-27 ENCOUNTER — Telehealth: Admitting: Physician Assistant

## 2024-08-27 ENCOUNTER — Other Ambulatory Visit: Payer: Self-pay

## 2024-08-27 DIAGNOSIS — B9689 Other specified bacterial agents as the cause of diseases classified elsewhere: Secondary | ICD-10-CM

## 2024-08-27 MED ORDER — ONABOTULINUMTOXINA 200 UNITS IJ SOLR
INTRAMUSCULAR | 4 refills | Status: AC
  Filled 2024-08-27: qty 1, fill #0

## 2024-08-27 MED ORDER — PREDNISONE 20 MG PO TABS: 40.0000 mg | ORAL_TABLET | Freq: Every day | ORAL | 0 refills | Status: AC

## 2024-08-27 NOTE — Addendum Note (Signed)
 Addended by: OZELL JESUSA PARAS on: 08/27/2024 02:37 PM   Modules accepted: Orders

## 2024-08-27 NOTE — Progress Notes (Signed)
"      E-Visit for Sinus Problems  We are sorry that you are not feeling well.  Here is how we plan to help!  Based on what you have shared with me it looks like you have sinusitis. I will add Prednisone  to current treatment plan. Take Prednisone  20mg  2 tablets (40mg ) daily for 5 days.   If your symptoms continue to not improve please seek IN-PERSON EVALUATION.   Get Help Right Away If: You develop worsening fever or sinus pain. You develop a severe head ache or visual changes. Your symptoms persist after you have completed your treatment plan.  Make sure you Understand these instructions. Will watch your condition. Will get help right away if you are not doing well or get worse.  Your e-visit answers were reviewed by a board certified advanced clinical practitioner to complete your personal care plan.  Depending on the condition, your plan could have included both over the counter or prescription medications.  If there is a problem please reply  once you have received a response from your provider.  Your safety is important to us .  If you have drug allergies check your prescription carefully.    You can use MyChart to ask questions about todays visit, request a non-urgent call back, or ask for a work or school excuse for 24 hours related to this e-Visit. If it has been greater than 24 hours you will need to follow up with your provider, or enter a new e-Visit to address those concerns.  You will get an e-mail in the next two days asking about your experience.  I hope that your e-visit has been valuable and will speed your recovery. Thank you for using e-visits.  I have spent 5 minutes in review of e-visit questionnaire, review and updating patient chart, medical decision making and response to patient.   Delon CHRISTELLA Dickinson, PA-C     "

## 2024-08-27 NOTE — Telephone Encounter (Signed)
 Yes, can fill with [facility].

## 2024-08-27 NOTE — Telephone Encounter (Signed)
 Botox  sent to [facility]

## 2024-10-03 ENCOUNTER — Ambulatory Visit: Admitting: Neurology

## 2024-10-10 ENCOUNTER — Ambulatory Visit: Admitting: Neurology

## 2024-10-15 ENCOUNTER — Ambulatory Visit: Admitting: Neurology
